# Patient Record
Sex: Female | Born: 1940 | Race: Black or African American | Hispanic: No | Marital: Married | Smoking: Former smoker
Health system: Southern US, Community
[De-identification: ages and names within clinical notes are randomized; demographics above are authoritative.]

## PROBLEM LIST (undated history)

## (undated) DIAGNOSIS — Z7401 Bed confinement status: Secondary | ICD-10-CM

## (undated) DIAGNOSIS — N189 Chronic kidney disease, unspecified: Secondary | ICD-10-CM

## (undated) DIAGNOSIS — M1990 Unspecified osteoarthritis, unspecified site: Secondary | ICD-10-CM

## (undated) DIAGNOSIS — L609 Nail disorder, unspecified: Secondary | ICD-10-CM

## (undated) DIAGNOSIS — K219 Gastro-esophageal reflux disease without esophagitis: Secondary | ICD-10-CM

## (undated) DIAGNOSIS — E039 Hypothyroidism, unspecified: Secondary | ICD-10-CM

## (undated) DIAGNOSIS — N390 Urinary tract infection, site not specified: Secondary | ICD-10-CM

## (undated) DIAGNOSIS — I10 Essential (primary) hypertension: Secondary | ICD-10-CM

## (undated) DIAGNOSIS — Z8619 Personal history of other infectious and parasitic diseases: Secondary | ICD-10-CM

## (undated) DIAGNOSIS — N361 Urethral diverticulum: Secondary | ICD-10-CM

## (undated) DIAGNOSIS — H547 Unspecified visual loss: Secondary | ICD-10-CM

## (undated) DIAGNOSIS — F0390 Unspecified dementia without behavioral disturbance: Secondary | ICD-10-CM

## (undated) DIAGNOSIS — E1140 Type 2 diabetes mellitus with diabetic neuropathy, unspecified: Secondary | ICD-10-CM

## (undated) DIAGNOSIS — R011 Cardiac murmur, unspecified: Secondary | ICD-10-CM

## (undated) HISTORY — PX: BACK SURGERY: SHX140

## (undated) HISTORY — PX: ABDOMINAL HYSTERECTOMY: SHX81

## (undated) HISTORY — PX: OTHER SURGICAL HISTORY: SHX169

## (undated) HISTORY — PX: APPENDECTOMY: SHX54

---

## 1998-01-20 ENCOUNTER — Encounter (HOSPITAL_COMMUNITY): Admission: RE | Admit: 1998-01-20 | Discharge: 1998-04-20 | Payer: Self-pay | Admitting: Nephrology

## 1998-04-20 ENCOUNTER — Encounter (HOSPITAL_COMMUNITY): Admission: RE | Admit: 1998-04-20 | Discharge: 1998-07-19 | Payer: Self-pay | Admitting: Nephrology

## 1998-07-24 ENCOUNTER — Encounter (HOSPITAL_COMMUNITY): Admission: RE | Admit: 1998-07-24 | Discharge: 1998-10-22 | Payer: Self-pay | Admitting: Nephrology

## 1998-09-18 ENCOUNTER — Ambulatory Visit (HOSPITAL_COMMUNITY): Admission: RE | Admit: 1998-09-18 | Discharge: 1998-09-18 | Payer: Self-pay | Admitting: Obstetrics and Gynecology

## 1998-10-18 ENCOUNTER — Other Ambulatory Visit: Admission: RE | Admit: 1998-10-18 | Discharge: 1998-10-18 | Payer: Self-pay | Admitting: Obstetrics and Gynecology

## 1998-10-23 ENCOUNTER — Encounter (HOSPITAL_COMMUNITY): Admission: RE | Admit: 1998-10-23 | Discharge: 1999-01-21 | Payer: Self-pay | Admitting: Nephrology

## 1999-01-29 ENCOUNTER — Encounter (HOSPITAL_COMMUNITY): Admission: RE | Admit: 1999-01-29 | Discharge: 1999-04-29 | Payer: Self-pay | Admitting: Nephrology

## 1999-03-05 ENCOUNTER — Ambulatory Visit (HOSPITAL_COMMUNITY): Admission: RE | Admit: 1999-03-05 | Discharge: 1999-03-05 | Payer: Self-pay | Admitting: Vascular Surgery

## 1999-03-05 ENCOUNTER — Encounter: Payer: Self-pay | Admitting: Vascular Surgery

## 1999-04-30 ENCOUNTER — Encounter (HOSPITAL_COMMUNITY): Admission: RE | Admit: 1999-04-30 | Discharge: 1999-05-21 | Payer: Self-pay | Admitting: Nephrology

## 1999-05-22 ENCOUNTER — Encounter (HOSPITAL_COMMUNITY): Admission: RE | Admit: 1999-05-22 | Discharge: 1999-08-20 | Payer: Self-pay | Admitting: Nephrology

## 1999-08-21 ENCOUNTER — Encounter (HOSPITAL_COMMUNITY): Admission: RE | Admit: 1999-08-21 | Discharge: 1999-11-19 | Payer: Self-pay | Admitting: Nephrology

## 1999-11-05 ENCOUNTER — Encounter: Payer: Self-pay | Admitting: Obstetrics and Gynecology

## 1999-11-05 ENCOUNTER — Ambulatory Visit (HOSPITAL_COMMUNITY): Admission: RE | Admit: 1999-11-05 | Discharge: 1999-11-05 | Payer: Self-pay | Admitting: Obstetrics and Gynecology

## 1999-11-22 ENCOUNTER — Encounter (HOSPITAL_COMMUNITY): Admission: RE | Admit: 1999-11-22 | Discharge: 2000-02-20 | Payer: Self-pay | Admitting: Nephrology

## 1999-11-30 ENCOUNTER — Other Ambulatory Visit: Admission: RE | Admit: 1999-11-30 | Discharge: 1999-11-30 | Payer: Self-pay | Admitting: Obstetrics and Gynecology

## 2000-02-04 ENCOUNTER — Encounter: Admission: RE | Admit: 2000-02-04 | Discharge: 2000-02-04 | Payer: Self-pay | Admitting: Nephrology

## 2000-02-04 ENCOUNTER — Encounter: Payer: Self-pay | Admitting: Nephrology

## 2000-02-18 ENCOUNTER — Encounter (HOSPITAL_COMMUNITY): Admission: RE | Admit: 2000-02-18 | Discharge: 2000-05-18 | Payer: Self-pay | Admitting: Nephrology

## 2000-04-28 ENCOUNTER — Encounter: Payer: Self-pay | Admitting: Nephrology

## 2000-05-15 ENCOUNTER — Encounter (HOSPITAL_COMMUNITY): Admission: RE | Admit: 2000-05-15 | Discharge: 2000-08-13 | Payer: Self-pay | Admitting: Nephrology

## 2000-08-04 ENCOUNTER — Inpatient Hospital Stay (HOSPITAL_COMMUNITY): Admission: RE | Admit: 2000-08-04 | Discharge: 2000-08-10 | Payer: Self-pay

## 2000-08-04 ENCOUNTER — Encounter (INDEPENDENT_AMBULATORY_CARE_PROVIDER_SITE_OTHER): Payer: Self-pay | Admitting: *Deleted

## 2000-08-08 ENCOUNTER — Encounter: Payer: Self-pay | Admitting: Nephrology

## 2000-08-12 ENCOUNTER — Inpatient Hospital Stay (HOSPITAL_COMMUNITY): Admission: EM | Admit: 2000-08-12 | Discharge: 2000-08-17 | Payer: Self-pay | Admitting: Emergency Medicine

## 2000-08-12 ENCOUNTER — Encounter (HOSPITAL_COMMUNITY): Admission: RE | Admit: 2000-08-12 | Discharge: 2000-11-10 | Payer: Self-pay | Admitting: Nephrology

## 2000-09-22 ENCOUNTER — Ambulatory Visit (HOSPITAL_COMMUNITY): Admission: RE | Admit: 2000-09-22 | Discharge: 2000-09-22 | Payer: Self-pay | Admitting: Vascular Surgery

## 2000-10-20 ENCOUNTER — Ambulatory Visit (HOSPITAL_COMMUNITY): Admission: RE | Admit: 2000-10-20 | Discharge: 2000-10-20 | Payer: Self-pay | Admitting: Vascular Surgery

## 2000-11-11 ENCOUNTER — Ambulatory Visit (HOSPITAL_COMMUNITY): Admission: RE | Admit: 2000-11-11 | Discharge: 2000-11-11 | Payer: Self-pay | Admitting: Nephrology

## 2000-11-17 ENCOUNTER — Encounter (HOSPITAL_COMMUNITY): Admission: RE | Admit: 2000-11-17 | Discharge: 2001-02-15 | Payer: Self-pay | Admitting: Nephrology

## 2000-12-01 ENCOUNTER — Encounter: Admission: RE | Admit: 2000-12-01 | Discharge: 2000-12-01 | Payer: Self-pay | Admitting: Nephrology

## 2000-12-01 ENCOUNTER — Encounter: Payer: Self-pay | Admitting: Nephrology

## 2001-01-05 ENCOUNTER — Ambulatory Visit (HOSPITAL_COMMUNITY): Admission: RE | Admit: 2001-01-05 | Discharge: 2001-01-05 | Payer: Self-pay | Admitting: Obstetrics and Gynecology

## 2001-01-05 ENCOUNTER — Encounter: Payer: Self-pay | Admitting: Obstetrics and Gynecology

## 2001-03-12 ENCOUNTER — Encounter: Admission: RE | Admit: 2001-03-12 | Discharge: 2001-03-12 | Payer: Self-pay | Admitting: *Deleted

## 2001-08-21 ENCOUNTER — Encounter: Payer: Self-pay | Admitting: Nephrology

## 2001-08-21 ENCOUNTER — Ambulatory Visit (HOSPITAL_COMMUNITY): Admission: RE | Admit: 2001-08-21 | Discharge: 2001-08-21 | Payer: Self-pay | Admitting: Nephrology

## 2002-02-17 ENCOUNTER — Encounter: Payer: Self-pay | Admitting: Ophthalmology

## 2002-02-17 ENCOUNTER — Ambulatory Visit (HOSPITAL_COMMUNITY): Admission: RE | Admit: 2002-02-17 | Discharge: 2002-02-17 | Payer: Self-pay | Admitting: Ophthalmology

## 2002-02-24 ENCOUNTER — Ambulatory Visit (HOSPITAL_COMMUNITY): Admission: RE | Admit: 2002-02-24 | Discharge: 2002-02-24 | Payer: Self-pay | Admitting: Internal Medicine

## 2002-07-05 ENCOUNTER — Encounter: Payer: Self-pay | Admitting: Nephrology

## 2002-07-05 ENCOUNTER — Encounter: Admission: RE | Admit: 2002-07-05 | Discharge: 2002-07-05 | Payer: Self-pay | Admitting: Nephrology

## 2002-10-01 ENCOUNTER — Encounter: Payer: Self-pay | Admitting: Nephrology

## 2002-10-01 ENCOUNTER — Encounter: Admission: RE | Admit: 2002-10-01 | Discharge: 2002-10-01 | Payer: Self-pay | Admitting: Nephrology

## 2002-11-22 ENCOUNTER — Other Ambulatory Visit: Admission: RE | Admit: 2002-11-22 | Discharge: 2002-11-22 | Payer: Self-pay | Admitting: Obstetrics and Gynecology

## 2002-11-29 ENCOUNTER — Encounter: Payer: Self-pay | Admitting: Obstetrics and Gynecology

## 2002-11-29 ENCOUNTER — Encounter: Admission: RE | Admit: 2002-11-29 | Discharge: 2002-11-29 | Payer: Self-pay | Admitting: Obstetrics and Gynecology

## 2003-04-20 ENCOUNTER — Ambulatory Visit (HOSPITAL_COMMUNITY): Admission: RE | Admit: 2003-04-20 | Discharge: 2003-04-20 | Payer: Self-pay | Admitting: Nephrology

## 2003-04-20 ENCOUNTER — Encounter: Payer: Self-pay | Admitting: Nephrology

## 2003-12-07 ENCOUNTER — Encounter: Admission: RE | Admit: 2003-12-07 | Discharge: 2003-12-07 | Payer: Self-pay | Admitting: Obstetrics and Gynecology

## 2004-06-25 ENCOUNTER — Emergency Department (HOSPITAL_COMMUNITY): Admission: EM | Admit: 2004-06-25 | Discharge: 2004-06-26 | Payer: Self-pay | Admitting: Emergency Medicine

## 2004-07-09 ENCOUNTER — Other Ambulatory Visit: Admission: RE | Admit: 2004-07-09 | Discharge: 2004-07-09 | Payer: Self-pay | Admitting: Obstetrics and Gynecology

## 2004-08-15 ENCOUNTER — Encounter: Admission: RE | Admit: 2004-08-15 | Discharge: 2004-08-15 | Payer: Self-pay | Admitting: Nephrology

## 2004-08-17 ENCOUNTER — Emergency Department (HOSPITAL_COMMUNITY): Admission: EM | Admit: 2004-08-17 | Discharge: 2004-08-18 | Payer: Self-pay | Admitting: Emergency Medicine

## 2004-08-27 ENCOUNTER — Inpatient Hospital Stay (HOSPITAL_COMMUNITY): Admission: EM | Admit: 2004-08-27 | Discharge: 2004-09-04 | Payer: Self-pay

## 2004-08-31 ENCOUNTER — Encounter (INDEPENDENT_AMBULATORY_CARE_PROVIDER_SITE_OTHER): Payer: Self-pay | Admitting: Specialist

## 2004-09-03 ENCOUNTER — Ambulatory Visit: Payer: Self-pay | Admitting: Physical Medicine & Rehabilitation

## 2004-09-04 ENCOUNTER — Inpatient Hospital Stay (HOSPITAL_COMMUNITY)
Admission: RE | Admit: 2004-09-04 | Discharge: 2004-09-12 | Payer: Self-pay | Admitting: Physical Medicine & Rehabilitation

## 2004-09-24 ENCOUNTER — Encounter
Admission: RE | Admit: 2004-09-24 | Discharge: 2004-12-23 | Payer: Self-pay | Admitting: Physical Medicine & Rehabilitation

## 2004-10-15 ENCOUNTER — Emergency Department (HOSPITAL_COMMUNITY): Admission: EM | Admit: 2004-10-15 | Discharge: 2004-10-16 | Payer: Self-pay | Admitting: Emergency Medicine

## 2004-10-19 ENCOUNTER — Encounter
Admission: RE | Admit: 2004-10-19 | Discharge: 2005-01-17 | Payer: Self-pay | Admitting: Physical Medicine & Rehabilitation

## 2004-10-24 ENCOUNTER — Ambulatory Visit: Payer: Self-pay | Admitting: Physical Medicine & Rehabilitation

## 2004-11-05 ENCOUNTER — Ambulatory Visit: Payer: Self-pay | Admitting: Internal Medicine

## 2004-11-26 ENCOUNTER — Ambulatory Visit: Payer: Self-pay | Admitting: Physical Medicine & Rehabilitation

## 2004-12-03 ENCOUNTER — Ambulatory Visit (HOSPITAL_COMMUNITY): Admission: RE | Admit: 2004-12-03 | Discharge: 2004-12-03 | Payer: Self-pay | Admitting: Obstetrics and Gynecology

## 2005-02-25 ENCOUNTER — Encounter
Admission: RE | Admit: 2005-02-25 | Discharge: 2005-05-26 | Payer: Self-pay | Admitting: Physical Medicine & Rehabilitation

## 2005-02-27 ENCOUNTER — Ambulatory Visit: Payer: Self-pay | Admitting: Physical Medicine & Rehabilitation

## 2005-03-27 ENCOUNTER — Ambulatory Visit (HOSPITAL_COMMUNITY): Admission: RE | Admit: 2005-03-27 | Discharge: 2005-03-27 | Payer: Self-pay | Admitting: Obstetrics and Gynecology

## 2005-04-26 ENCOUNTER — Ambulatory Visit: Payer: Self-pay | Admitting: Physical Medicine & Rehabilitation

## 2005-07-26 ENCOUNTER — Encounter
Admission: RE | Admit: 2005-07-26 | Discharge: 2005-10-24 | Payer: Self-pay | Admitting: Physical Medicine & Rehabilitation

## 2005-07-26 ENCOUNTER — Ambulatory Visit: Payer: Self-pay | Admitting: Physical Medicine & Rehabilitation

## 2005-10-22 ENCOUNTER — Ambulatory Visit: Payer: Self-pay | Admitting: Physical Medicine & Rehabilitation

## 2005-10-30 ENCOUNTER — Encounter
Admission: RE | Admit: 2005-10-30 | Discharge: 2005-11-20 | Payer: Self-pay | Admitting: Physical Medicine & Rehabilitation

## 2005-12-06 ENCOUNTER — Ambulatory Visit (HOSPITAL_COMMUNITY): Admission: RE | Admit: 2005-12-06 | Discharge: 2005-12-06 | Payer: Self-pay | Admitting: Obstetrics and Gynecology

## 2005-12-17 ENCOUNTER — Ambulatory Visit: Payer: Self-pay | Admitting: Physical Medicine & Rehabilitation

## 2005-12-17 ENCOUNTER — Encounter
Admission: RE | Admit: 2005-12-17 | Discharge: 2006-03-17 | Payer: Self-pay | Admitting: Physical Medicine & Rehabilitation

## 2006-06-27 ENCOUNTER — Encounter: Admission: RE | Admit: 2006-06-27 | Discharge: 2006-06-27 | Payer: Self-pay | Admitting: Nephrology

## 2006-07-09 ENCOUNTER — Encounter (HOSPITAL_COMMUNITY): Admission: RE | Admit: 2006-07-09 | Discharge: 2006-10-07 | Payer: Self-pay | Admitting: Nephrology

## 2006-10-21 HISTORY — PX: KIDNEY TRANSPLANT: SHX239

## 2007-03-31 ENCOUNTER — Ambulatory Visit: Payer: Self-pay | Admitting: Internal Medicine

## 2007-03-31 ENCOUNTER — Inpatient Hospital Stay (HOSPITAL_COMMUNITY): Admission: EM | Admit: 2007-03-31 | Discharge: 2007-04-13 | Payer: Self-pay | Admitting: Emergency Medicine

## 2007-03-31 ENCOUNTER — Ambulatory Visit: Payer: Self-pay | Admitting: Cardiovascular Disease

## 2007-04-01 ENCOUNTER — Encounter (INDEPENDENT_AMBULATORY_CARE_PROVIDER_SITE_OTHER): Payer: Self-pay | Admitting: Internal Medicine

## 2007-04-10 ENCOUNTER — Ambulatory Visit: Payer: Self-pay | Admitting: Physical Medicine & Rehabilitation

## 2007-07-07 ENCOUNTER — Encounter (HOSPITAL_COMMUNITY): Admission: RE | Admit: 2007-07-07 | Discharge: 2007-10-05 | Payer: Self-pay | Admitting: Nephrology

## 2007-07-31 ENCOUNTER — Ambulatory Visit: Payer: Self-pay | Admitting: Vascular Surgery

## 2007-10-02 ENCOUNTER — Inpatient Hospital Stay (HOSPITAL_COMMUNITY): Admission: EM | Admit: 2007-10-02 | Discharge: 2007-10-06 | Payer: Self-pay | Admitting: Emergency Medicine

## 2007-10-27 ENCOUNTER — Encounter (HOSPITAL_COMMUNITY): Admission: RE | Admit: 2007-10-27 | Discharge: 2008-01-25 | Payer: Self-pay | Admitting: Nephrology

## 2008-01-26 ENCOUNTER — Encounter (HOSPITAL_COMMUNITY): Admission: RE | Admit: 2008-01-26 | Discharge: 2008-04-25 | Payer: Self-pay | Admitting: Nephrology

## 2008-02-04 ENCOUNTER — Encounter: Payer: Self-pay | Admitting: Vascular Surgery

## 2008-02-04 ENCOUNTER — Ambulatory Visit: Payer: Self-pay | Admitting: Vascular Surgery

## 2008-02-04 ENCOUNTER — Ambulatory Visit (HOSPITAL_COMMUNITY): Admission: RE | Admit: 2008-02-04 | Discharge: 2008-02-04 | Payer: Self-pay | Admitting: Vascular Surgery

## 2008-02-26 ENCOUNTER — Ambulatory Visit: Payer: Self-pay | Admitting: Vascular Surgery

## 2008-05-17 ENCOUNTER — Encounter (HOSPITAL_COMMUNITY): Admission: RE | Admit: 2008-05-17 | Discharge: 2008-08-15 | Payer: Self-pay | Admitting: Nephrology

## 2008-08-17 ENCOUNTER — Encounter (HOSPITAL_COMMUNITY): Admission: RE | Admit: 2008-08-17 | Discharge: 2008-10-20 | Payer: Self-pay | Admitting: Nephrology

## 2008-10-25 ENCOUNTER — Encounter (HOSPITAL_COMMUNITY): Admission: RE | Admit: 2008-10-25 | Discharge: 2009-01-23 | Payer: Self-pay | Admitting: Nephrology

## 2009-01-18 ENCOUNTER — Ambulatory Visit: Payer: Self-pay | Admitting: Infectious Diseases

## 2009-01-18 ENCOUNTER — Ambulatory Visit: Payer: Self-pay | Admitting: Critical Care Medicine

## 2009-01-18 ENCOUNTER — Inpatient Hospital Stay (HOSPITAL_COMMUNITY): Admission: EM | Admit: 2009-01-18 | Discharge: 2009-01-27 | Payer: Self-pay | Admitting: Emergency Medicine

## 2009-01-26 ENCOUNTER — Encounter: Payer: Self-pay | Admitting: Endocrinology

## 2009-02-17 ENCOUNTER — Encounter (HOSPITAL_COMMUNITY): Admission: RE | Admit: 2009-02-17 | Discharge: 2009-05-18 | Payer: Self-pay | Admitting: Nephrology

## 2009-02-19 ENCOUNTER — Inpatient Hospital Stay (HOSPITAL_COMMUNITY): Admission: EM | Admit: 2009-02-19 | Discharge: 2009-02-21 | Payer: Self-pay | Admitting: Emergency Medicine

## 2009-05-10 ENCOUNTER — Inpatient Hospital Stay (HOSPITAL_COMMUNITY): Admission: EM | Admit: 2009-05-10 | Discharge: 2009-05-17 | Payer: Self-pay | Admitting: Emergency Medicine

## 2009-05-24 ENCOUNTER — Encounter (HOSPITAL_COMMUNITY): Admission: RE | Admit: 2009-05-24 | Discharge: 2009-08-22 | Payer: Self-pay | Admitting: Nephrology

## 2009-07-24 ENCOUNTER — Inpatient Hospital Stay (HOSPITAL_COMMUNITY): Admission: EM | Admit: 2009-07-24 | Discharge: 2009-07-28 | Payer: Self-pay | Admitting: Emergency Medicine

## 2009-07-26 ENCOUNTER — Ambulatory Visit: Payer: Self-pay | Admitting: Infectious Disease

## 2009-09-12 ENCOUNTER — Encounter (HOSPITAL_COMMUNITY): Admission: RE | Admit: 2009-09-12 | Discharge: 2009-12-11 | Payer: Self-pay | Admitting: Nephrology

## 2009-11-01 ENCOUNTER — Encounter: Admission: RE | Admit: 2009-11-01 | Discharge: 2009-11-01 | Payer: Self-pay | Admitting: Nephrology

## 2009-12-26 ENCOUNTER — Encounter (HOSPITAL_COMMUNITY): Admission: RE | Admit: 2009-12-26 | Discharge: 2010-03-26 | Payer: Self-pay | Admitting: Nephrology

## 2010-03-27 ENCOUNTER — Encounter (HOSPITAL_COMMUNITY): Admission: RE | Admit: 2010-03-27 | Discharge: 2010-06-25 | Payer: Self-pay | Admitting: Nephrology

## 2010-06-27 ENCOUNTER — Encounter (HOSPITAL_COMMUNITY): Admission: RE | Admit: 2010-06-27 | Discharge: 2010-09-25 | Payer: Self-pay | Admitting: Nephrology

## 2010-07-18 ENCOUNTER — Emergency Department (HOSPITAL_COMMUNITY): Admission: EM | Admit: 2010-07-18 | Discharge: 2010-07-19 | Payer: Self-pay | Admitting: Emergency Medicine

## 2010-07-20 ENCOUNTER — Encounter (INDEPENDENT_AMBULATORY_CARE_PROVIDER_SITE_OTHER): Payer: Self-pay | Admitting: *Deleted

## 2010-09-24 ENCOUNTER — Inpatient Hospital Stay (HOSPITAL_COMMUNITY)
Admission: EM | Admit: 2010-09-24 | Discharge: 2010-09-28 | Payer: Self-pay | Source: Home / Self Care | Attending: Endocrinology | Admitting: Endocrinology

## 2010-10-19 ENCOUNTER — Encounter (HOSPITAL_COMMUNITY)
Admission: RE | Admit: 2010-10-19 | Discharge: 2010-11-20 | Payer: Self-pay | Source: Home / Self Care | Attending: Nephrology | Admitting: Nephrology

## 2010-11-10 ENCOUNTER — Encounter: Payer: Self-pay | Admitting: Physical Medicine and Rehabilitation

## 2010-11-11 ENCOUNTER — Encounter: Payer: Self-pay | Admitting: Nephrology

## 2010-11-11 ENCOUNTER — Encounter: Payer: Self-pay | Admitting: Obstetrics and Gynecology

## 2010-11-13 ENCOUNTER — Inpatient Hospital Stay (HOSPITAL_COMMUNITY)
Admission: EM | Admit: 2010-11-13 | Discharge: 2010-11-20 | Payer: Self-pay | Source: Home / Self Care | Attending: Endocrinology | Admitting: Endocrinology

## 2010-11-14 LAB — DIFFERENTIAL
Basophils Relative: 0 % (ref 0–1)
Lymphs Abs: 0.3 10*3/uL — ABNORMAL LOW (ref 0.7–4.0)
Monocytes Relative: 4 % (ref 3–12)
Neutro Abs: 5.2 10*3/uL (ref 1.7–7.7)
Neutrophils Relative %: 91 % — ABNORMAL HIGH (ref 43–77)

## 2010-11-14 LAB — URINALYSIS, ROUTINE W REFLEX MICROSCOPIC
Bilirubin Urine: NEGATIVE
Ketones, ur: NEGATIVE mg/dL
Nitrite: NEGATIVE
Protein, ur: NEGATIVE mg/dL
Specific Gravity, Urine: 1.015 (ref 1.005–1.030)
Urobilinogen, UA: 0.2 mg/dL (ref 0.0–1.0)

## 2010-11-14 LAB — CBC
HCT: 40.3 % (ref 36.0–46.0)
Hemoglobin: 12.4 g/dL (ref 12.0–15.0)
Hemoglobin: 12.1 g/dL (ref 12.0–15.0)
MCH: 26.4 pg (ref 26.0–34.0)
MCH: 26.2 pg (ref 26.0–34.0)
MCV: 88.3 fL (ref 78.0–100.0)
MCV: 87.2 fL (ref 78.0–100.0)
RBC: 4.7 MIL/uL (ref 3.87–5.11)
RBC: 4.62 MIL/uL (ref 3.87–5.11)
WBC: 5.8 10*3/uL (ref 4.0–10.5)
WBC: 4.8 10*3/uL (ref 4.0–10.5)

## 2010-11-14 LAB — COMPREHENSIVE METABOLIC PANEL
ALT: 11 U/L (ref 0–35)
ALT: 9 U/L (ref 0–35)
AST: 16 U/L (ref 0–37)
AST: 12 U/L (ref 0–37)
Alkaline Phosphatase: 55 U/L (ref 39–117)
Alkaline Phosphatase: 54 U/L (ref 39–117)
CO2: 29 mEq/L (ref 19–32)
CO2: 30 mEq/L (ref 19–32)
Chloride: 100 mEq/L (ref 96–112)
Creatinine, Ser: 2.19 mg/dL — ABNORMAL HIGH (ref 0.4–1.2)
GFR calc Af Amer: 27 mL/min — ABNORMAL LOW (ref >60)
GFR calc Af Amer: 28 mL/min — ABNORMAL LOW (ref >60)
GFR calc non Af Amer: 22 mL/min — ABNORMAL LOW (ref >60)
GFR calc non Af Amer: 23 mL/min — ABNORMAL LOW (ref >60)
Glucose, Bld: 220 mg/dL — ABNORMAL HIGH (ref 70–99)
Potassium: 5.1 mEq/L (ref 3.5–5.1)
Potassium: 3.9 mEq/L (ref 3.5–5.1)
Sodium: 143 mEq/L (ref 135–145)
Total Bilirubin: 0.3 mg/dL (ref 0.3–1.2)

## 2010-11-14 LAB — GLUCOSE, CAPILLARY: Glucose-Capillary: 245 mg/dL — ABNORMAL HIGH (ref 70–99)

## 2010-11-15 LAB — COMPREHENSIVE METABOLIC PANEL
ALT: 8 U/L (ref 0–35)
Alkaline Phosphatase: 52 U/L (ref 39–117)
CO2: 28 mEq/L (ref 19–32)
Chloride: 108 mEq/L (ref 96–112)
GFR calc non Af Amer: 29 mL/min — ABNORMAL LOW (ref >60)
Glucose, Bld: 70 mg/dL (ref 70–99)
Potassium: 3.7 mEq/L (ref 3.5–5.1)
Sodium: 143 mEq/L (ref 135–145)

## 2010-11-15 LAB — GLUCOSE, CAPILLARY: Glucose-Capillary: 140 mg/dL — ABNORMAL HIGH (ref 70–99)

## 2010-11-15 LAB — URINE CULTURE
Colony Count: >=100000
Culture  Setup Time: 201201242033

## 2010-11-15 NOTE — H&P (Addendum)
NAMESERAPHINE, GUDIEL                  ACCOUNT NO.:  [id number]  MEDICAL RECORD NO.:  [id number]          PATIENT TYPE:  INP  LOCATION:  5524                         FACILITY:  [facility]  PHYSICIAN:  Gaspar Garbe, M.D.DATE OF BIRTH:  10/28/1940  DATE OF ADMISSION:  11/13/2010 DATE OF DISCHARGE:                             HISTORY & PHYSICAL   CHIEF COMPLAINT:  Altered mental status, UTI.  HISTORY OF PRESENT ILLNESS:  The patient is a 70 year old African American female who is status post renal transplant in 2007, which had been performed at [facility].  The last hospitalization was in December 2011 because of urinary tract infection and altered mental status.  She normally takes Cipro for prophylaxis but per report, probably has not been taking it for the past week until taking 1 dose yesterday.  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  Over the past couple of days, she has had some decrease in her mental status and notes some burning during urination, changes in her mental status.  She was brought to the emergency room and found to have a considerable urinary tract infection. I was asked to admit the patient.  The patient was started on IV vancomycin, however, the ER physician neglected to write for blood cultures prior to doing this.  Fortunately, a urine culture was ordered and is currently pending.  ALLERGIES:  PENICILLIN, but the patient has taken cefepime successfully in the past.  ASPIRIN, TYLENOL, IV CONTRAST due to her kidney function, CYCLOSPORINE, OXYCODONE, HYDROCORTISONE, and LATEX.  MEDICATIONS: 1. Amlodipine 10 mg daily. 2. Allopurinol 150 mg daily. 3. Calcitriol 0.05 tablets 1 tablet daily. 4. Fentanyl 50 mcg plus 12 mcg patches every 3 days. 5. Lasix 40 mg  b.i.d. 6. Neurontin 100 mg in the morning, 200 at night. 7. Levemir 28 units subcu daily.  The patient has taken her dose     today. 8. Sliding scale by Humalog. 9. Labetalol 200 mg t.i.d. 10.Synthroid 175 mcg daily. 11.Magnesium oxide 400 mg daily. 12.Lipitor 40 mg daily. 13.Potassium chloride 40 mEq daily. 14.Prednisone 10 mg daily. 15.Question Cipro for prophylaxis. 16.Rapamune 3 mg daily. 17.Prilosec 20 mg daily. 18.Vitamin D 50,000 units weekly. 19.Calcium and vitamin D t.i.d. with meals. 20.CellCept 500 mg twice daily. 21.Acyclovir ophthalmic ointment twice daily. 22.Question of Valtrex, her husband is not certain and it is missing     from the automated version through the emergency room, although she     did state this as once daily.  PAST MEDICAL HISTORY: 1. Status post renal transplant in 2007, followed by [facility]. 2. Herpes zoster. 3. Urosepsis with encephalopathy in December 2011. 4. Hypothyroidism. 5. Anemia of chronic disease. 6. Diabetes mellitus type 2. 7. History of gout. 8. Hypothyroidism. 9. Hyperlipidemia. 10.Vitamin D deficiency. 11.Depression.  PAST SURGICAL HISTORY:  Renal transplant as above, parathyroidectomy, TAH, C-spine, tonsils, carpal tunnel, bilateral cataracts.  SOCIAL HISTORY:  The  patient lives in [place] with her husband.  He is at bedside.  She is married and she is nonsmoker, nondrinker.  FAMILY HISTORY:  Mother died from diabetic complications at age 45. Father died of cancer at 53.  She has a brother who died of coronary artery disease at age 49.  REVIEW OF SYSTEMS:  The patient is more alert than when she first arrived.  No fevers or chills but frequency and burning with urination. She has had altered mental status earlier.  She denies any chest pain or shortness of breath, any new fatigue, any new abdominal pain, and review of systems is negative on 12-point scale.  ADVANCED DIRECTIVES:  The patient is full  code.  PHYSICAL EXAMINATION:  VITAL SIGNS:  Temperature 99, pulse 82, respiratory rate 18, blood pressure 134/78, satting 98% on room air. GENERAL:  Ill appearing. HEENT:  Her left eye is swollen and shut, consistent with her herpes infection.  Oropharynx is clear. NECK:  Supple.  No lymphadenopathy, JVD, or bruit. HEART:  Regular rate and rhythm.  No murmur, rub, or gallop. LUNGS:  Clear to auscultation bilaterally. ABDOMEN:  Soft and nontender.  Normoactive bowel sounds.  No tenderness over transplant site. EXTREMITIES:  No clubbing, cyanosis, or edema. MUSCULOSKELETAL:  No joint deformities noted. NEUROLOGIC:  The patient is oriented to person, place, and time currently, but was not at the time of admission to the emergency room. Strength 4+ to 5 bilaterally and equal with normal sensation throughout.  Chest x-ray, no acuity noted.  Head CT is nonacute as well.  LABS:  White count 5.8, hemoglobin 12.4, hematocrit 41.5, platelets 252, BUN and creatinine 30 and 2.1 respectively, which are elevated from her prior hospitalization discharge with her baseline at that time showing a creatinine of 1.6.  Her LFTs are within normal limits.  Her albumin is slightly decreased at 3.5.  Urinalysis shows white blood cells too numerous to count, large LE, with nitrite negative.  She does have glucose in her urine as well and her glucose was 362.  ASSESSMENT AND PLAN: 1. Urinary tract infection, questionable if urosepsis.  I will not     treat her with Cipro as this is her prophylactic drug and she has     been given vancomycin IV per the emergency room, presumably to     cover sepsis, they will not cover urinary tract infections.  I will     put her on cefepime for this as her records indicate that she has     tolerated that well.  We will dose this renally at pharmacy dose.     Unfortunately, she was given vancomycin prior to proceeding with     blood cultures which may makes impossible for  [place] to find a     causative agent if her urine does not grow.  Therefore, we would be     looking at presumptive diagnosis and the empiric course of     antibiotics in the future which is unfortunately not very helpful.     Per her records, Dr. Ninetta Lights of Infectious Disease saw her at last     hospitalization.  I will leave up to Dr. Evlyn Kanner as to whether to get     consultation as her labs come back tomorrow. 2. Altered mental status.  It seems to be improving with IV fluids.     She has received a liter of bolus and will continue her on IV     fluids  here.  I will hold her Lasix. 3. History of renal transplant.  We will continue follow her     immunosuppression.  I have asked the pharmacy to help with her     dosing. 4. Dehydration as noted above. 5. Diabetes mellitus.  We will continue her normal home insulin.  Her     sugars are most likely higher because of her illness.  She has had     issues with hypoglycemia in the past, however, she was not given a     sliding scale until I asked the nursing staff to give her 6 units     of insulin . with a slight glucosuria and altered mental status,     her blood sugar will  be greater than 300. 6. Hypothyroidism.  We will check her TSH and continue Synthroid. 7. We will continue her medications for her herpes ophthalmic ointment     perhaps this needs to be correlated with her home pharmacy.  I will     leave it up to the pharmacy to look into this as well. 8. Continue Duragesic for pain relief. 9. Continue allopurinol or gout prophylaxis, this may need to be     adjusted if her creatinine kinase rise.     Gaspar Garbe, M.D.     RWT/MEDQ  D:  11/13/2010  T:  11/14/2010  Job:  [id number]  Electronically Signed by Guerry Bruin M.D. on 11/15/2010 04:18:48 PM

## 2010-11-16 LAB — GLUCOSE, CAPILLARY: Glucose-Capillary: 86 mg/dL (ref 70–99)

## 2010-11-17 LAB — GLUCOSE, CAPILLARY
Glucose-Capillary: 115 mg/dL — ABNORMAL HIGH (ref 70–99)
Glucose-Capillary: 85 mg/dL (ref 70–99)

## 2010-11-17 LAB — COMPREHENSIVE METABOLIC PANEL
ALT: <8 U/L (ref 0–35)
Alkaline Phosphatase: 45 U/L (ref 39–117)
CO2: 24 mEq/L (ref 19–32)
Calcium: 8.6 mg/dL (ref 8.4–10.5)
Chloride: 112 mEq/L (ref 96–112)
GFR calc non Af Amer: 38 mL/min — ABNORMAL LOW (ref >60)
Glucose, Bld: 101 mg/dL — ABNORMAL HIGH (ref 70–99)
Potassium: 3.9 mEq/L (ref 3.5–5.1)
Sodium: 143 mEq/L (ref 135–145)
Total Bilirubin: 0.6 mg/dL (ref 0.3–1.2)

## 2010-11-17 LAB — CBC
HCT: 40.1 % (ref 36.0–46.0)
Hemoglobin: 11.8 g/dL — ABNORMAL LOW (ref 12.0–15.0)
MCHC: 29.4 g/dL — ABNORMAL LOW (ref 30.0–36.0)
RBC: 4.53 MIL/uL (ref 3.87–5.11)

## 2010-11-18 LAB — DIFFERENTIAL
Eosinophils Relative: 1 % (ref 0–5)
Lymphocytes Relative: 11 % — ABNORMAL LOW (ref 12–46)
Lymphs Abs: 0.3 10*3/uL — ABNORMAL LOW (ref 0.7–4.0)
Monocytes Absolute: 0.9 10*3/uL (ref 0.1–1.0)
Neutro Abs: 1.8 10*3/uL (ref 1.7–7.7)

## 2010-11-18 LAB — GLUCOSE, CAPILLARY
Glucose-Capillary: 61 mg/dL — ABNORMAL LOW (ref 70–99)
Glucose-Capillary: 73 mg/dL (ref 70–99)
Glucose-Capillary: 168 mg/dL — ABNORMAL HIGH (ref 70–99)

## 2010-11-18 LAB — COMPREHENSIVE METABOLIC PANEL
ALT: 10 U/L (ref 0–35)
Alkaline Phosphatase: 44 U/L (ref 39–117)
BUN: 10 mg/dL (ref 6–23)
CO2: 29 mEq/L (ref 19–32)
Calcium: 9.1 mg/dL (ref 8.4–10.5)
GFR calc non Af Amer: 35 mL/min — ABNORMAL LOW (ref >60)
Glucose, Bld: 65 mg/dL — ABNORMAL LOW (ref 70–99)
Potassium: 4 mEq/L (ref 3.5–5.1)
Total Protein: 5.7 g/dL — ABNORMAL LOW (ref 6.0–8.3)

## 2010-11-18 LAB — CBC
HCT: 35.9 % — ABNORMAL LOW (ref 36.0–46.0)
Hemoglobin: 10.7 g/dL — ABNORMAL LOW (ref 12.0–15.0)
MCHC: 29.8 g/dL — ABNORMAL LOW (ref 30.0–36.0)
MCV: 87.8 fL (ref 78.0–100.0)
RDW: 17.6 % — ABNORMAL HIGH (ref 11.5–15.5)

## 2010-11-19 LAB — COMPREHENSIVE METABOLIC PANEL
AST: 13 U/L (ref 0–37)
Alkaline Phosphatase: 43 U/L (ref 39–117)
CO2: 28 mEq/L (ref 19–32)
Chloride: 104 mEq/L (ref 96–112)
Creatinine, Ser: 1.4 mg/dL — ABNORMAL HIGH (ref 0.4–1.2)
GFR calc Af Amer: 45 mL/min — ABNORMAL LOW (ref >60)
GFR calc non Af Amer: 37 mL/min — ABNORMAL LOW (ref >60)
Potassium: 4.2 mEq/L (ref 3.5–5.1)
Total Bilirubin: 0.5 mg/dL (ref 0.3–1.2)

## 2010-11-19 LAB — GLUCOSE, CAPILLARY
Glucose-Capillary: 104 mg/dL — ABNORMAL HIGH (ref 70–99)
Glucose-Capillary: 125 mg/dL — ABNORMAL HIGH (ref 70–99)

## 2010-11-20 LAB — CULTURE, BLOOD (ROUTINE X 2)
Culture  Setup Time: 201201250552
Culture: NO GROWTH

## 2010-11-20 LAB — GLUCOSE, CAPILLARY: Glucose-Capillary: 40 mg/dL — CL (ref 70–99)

## 2010-11-22 NOTE — Letter (Signed)
Summary: New Patient letter  [facility]  [address] [place], [place] [address]   Phone: [phone]  Fax: [phone]       07/20/2010 MRN: [id number]  [name] [address] [place], [place]  [address]  Dear [name],  Welcome to the Gastroenterology Division at [facility].    You are scheduled to see Dr.  Marina Goodell on 09-06-10 at 10:00a.m. on the 3rd floor at [facility], [address].  We ask that you try to arrive at our office 15 minutes prior to your appointment time to allow for check-in.  We would like you to complete the enclosed self-administered evaluation form prior to your visit and bring it with you on the day of your appointment.  We will review it with you.  Also, please bring a complete list of all your medications or, if you prefer, bring the medication bottles and we will list them.  Please bring your insurance card so that we may make a copy of it.  If your insurance requires a referral to see a specialist, please bring your referral form from your primary care physician.  Co-payments are due at the time of your visit and may be paid by cash, check or credit card.     Your office visit will consist of a consult with your physician (includes a physical exam), any laboratory testing he/she may order, scheduling of any necessary diagnostic testing (e.g. x-ray, ultrasound, CT-scan), and scheduling of a procedure (e.g. Endoscopy, Colonoscopy) if required.  Please allow enough time on your schedule to allow for any/all of these possibilities.    If you cannot keep your appointment, please call [phone] to cancel or reschedule prior to your appointment date.  This allows [place] the opportunity to schedule an appointment for another patient in need of care.  If you do not cancel or reschedule by 5 p.m. the business day prior to your appointment date, you will be charged a $50.00 late cancellation/no-show fee.    Thank you for choosing [facility] for your medical needs.  We appreciate the opportunity to care for you.  Please visit [place] at our website  to learn more about our practice.                     Sincerely,                                                             The Gastroenterology Division

## 2010-11-26 NOTE — Discharge Summary (Signed)
[name], [name]                  ACCOUNT NO.:  [id number]  MEDICAL RECORD NO.:  [id number]          PATIENT TYPE:  INP  LOCATION:  5524                         FACILITY:  [facility]  PHYSICIAN:  Tera Mater. Evlyn Kanner, M.D. DATE OF BIRTH:  10-19-1941  DATE OF ADMISSION:  11/13/2010 DATE OF DISCHARGE:  11/20/2010                              DISCHARGE SUMMARY   DISCHARGE DIAGNOSES: 1. Severe enterococcal urinary tract infection with early sepsis,     clinically much improved now finished with antibiotics. 2. Significant varicella zoster virus involving left eye and forehead,     now with postherpetic neuralgia causing trouble. 3. Type 2 diabetes with blood sugars under fair control with a low     blood sugar this morning. 4. Renal transplant with stable renal parameters. 5. Hypothyroidism, being treated 6. Hypertension with variable control. 7. History of gout on suppressive therapy. 8. Hyperlipidemia, being treated. 9. Anemia of chronic disease with stable levels. 10.Vitamin D deficiency. 11.Depression.  CONSULTATIONS:  None.  PROCEDURES:  A CT of the head.  [name] is a 70 year old black female who presented to my partner, Dr. Wylene Simmer, on November 13, 2010.  As with many other times she has presented, she came in with altered mental status with fever and significant UTI with what we felt to be impending sepsis.  Fortunately, the patient did well yet again.  There was some question as to whether she had been omitting her ciprofloxacin suppressive therapy.  The patient has been treated now with roughly 7 days of IV vancomycin.  She has been afebrile for several days.  During this hospitalization, she has had a flare of the recently diagnosed varicella zoster virus.  This involves her left eye and forehead and is causing significant pain and she had tremendous swelling of this eye.  There was also question as to whether she had some sort of allergic reaction to the eye ointment  that has been used.  She is now back on some suppressive viral therapy to try to help this thinking that with her immunosuppressed state that she might need more treatment.  Blood sugars have been good until this morning.  She had a low.  She did not eat quite as well yesterday. Bowels are working well.  Blood pressure has been variable, and I have increased her Lopressor and going to give that little bit of time to work.  Lipid therapy has been continued.  Her mobilization has been slow but she is doing a bit better at the present time.  This morning vital signs, blood pressure 164/66, pulse 75, respiratory rate 20, temperature 98, O2 sat 94%.  Her lab data yesterday, sodium 143, potassium 4.2, chloride 104, CO2 28, BUN 14, creatinine 1.4, estimated GFR of 45, a glucose of 90 was noted.  Albumin is 2.9 and total protein 5.9.  Calcium 9.0.  Liver function testing was normal.  CBC 2 days ago, white count 3100, hemoglobin 10.7, platelets 255,000.  At presentation, white count was 5800, hemoglobin 12.7, platelets 152,000.  Initial chemistries, sodium 141, potassium 5.1, chloride 100, CO2 29, BUN 30, creatinine 2.19,  glucose of 362.  Estimated GFR of 27.  Liver function testing was normal.  Albumin was 3.5.  An urinalysis was large leukocytes 250 mg and glucose, small blood.  Microscopic was too numerous to count white cells and 0-2 reds.  As noted, the urine culture did return with enterococcus greater than 100,000.  Two blood cultures on November 13, 2010, have remained negative.  A TSH was reasonable at 0.897.  RADIOLOGY TESTING:  With her altered mental status at presentation, a CT of the head was done and it showed atrophy and small vessel disease, no acute cranial findings.  A chest x-ray was also done with no active disease.  In summary, I have a 70 year old black female with history of renal transplant presenting with recurrent urinary tract infection and early sepsis.  Her altered  mental status has improved.  Multiple medical problems were addressed as above.  Her discharge medications will include: 1. A new dose of gabapentin which is 400 mg three times a day. 2. Labetalol now at 300 mg every 8 hours, it is a new dose. 3. She is on CellCept 500 mg twice daily. 4. Rapamune 3 mg daily. 5. Amlodipine 10 daily. 6. Allopurinol 150 daily. 7. Calcitriol 0.5 daily. 8. Her Cipro will return as suppressive therapy, 250, Monday,     Wednesday, and Friday. 9. Crestor is 40 mg daily 10.She is on Excedrin Migraine as needed. 11.She is on a fentanyl patch both a 50 and a 12.5 every 3 days. 12.Lasix returns to 40 mg daily. 13.She is on her sliding scale of Humalog that she has used before and     28 of Levemir. 14.Levothyroxine is 175. 15.Mag oxide is 400 once daily. 16.Prednisone is 10 mg daily. 17.Her sirolimus is 3 mg daily. 18.She is going to stay on valganciclovir 1 g for probably another     week to 10 days. 19.Vitamin D will be 50,000 units weekly. 20.Zantac is 150.  She will come see me in a week.  She is to call if she has recurrent fever.  She is to slowly mobilize.  She is to resume her prior diet.  No wound care is necessary.          ______________________________ Tera Mater Evlyn Kanner, M.D.     SAS/MEDQ  D:  11/20/2010  T:  11/20/2010  Job:  [id number]  Electronically Signed by Adrian Prince M.D. on 11/26/2010 07:00:34 PM

## 2011-01-01 LAB — BASIC METABOLIC PANEL
BUN: 16 mg/dL (ref 6–23)
BUN: 24 mg/dL — ABNORMAL HIGH (ref 6–23)
CO2: 25 mEq/L (ref 19–32)
CO2: 27 mEq/L (ref 19–32)
Calcium: 8.6 mg/dL (ref 8.4–10.5)
Calcium: 8.7 mg/dL (ref 8.4–10.5)
Chloride: 109 mEq/L (ref 96–112)
Chloride: 113 mEq/L — ABNORMAL HIGH (ref 96–112)
Creatinine, Ser: 1.63 mg/dL — ABNORMAL HIGH (ref 0.4–1.2)
Creatinine, Ser: 1.88 mg/dL — ABNORMAL HIGH (ref 0.4–1.2)
GFR calc Af Amer: 28 mL/min — ABNORMAL LOW (ref >60)
GFR calc Af Amer: 38 mL/min — ABNORMAL LOW (ref >60)
GFR calc non Af Amer: 23 mL/min — ABNORMAL LOW (ref >60)
GFR calc non Af Amer: 26 mL/min — ABNORMAL LOW (ref >60)
GFR calc non Af Amer: 31 mL/min — ABNORMAL LOW (ref >60)
GFR calc non Af Amer: 32 mL/min — ABNORMAL LOW (ref >60)
Glucose, Bld: 182 mg/dL — ABNORMAL HIGH (ref 70–99)
Glucose, Bld: 78 mg/dL (ref 70–99)
Potassium: 4.1 mEq/L (ref 3.5–5.1)
Potassium: 4.3 mEq/L (ref 3.5–5.1)
Potassium: 4.5 mEq/L (ref 3.5–5.1)
Sodium: 144 mEq/L (ref 135–145)
Sodium: 144 mEq/L (ref 135–145)
Sodium: 145 mEq/L (ref 135–145)

## 2011-01-01 LAB — GLUCOSE, CAPILLARY
Glucose-Capillary: 146 mg/dL — ABNORMAL HIGH (ref 70–99)
Glucose-Capillary: 182 mg/dL — ABNORMAL HIGH (ref 70–99)
Glucose-Capillary: 190 mg/dL — ABNORMAL HIGH (ref 70–99)
Glucose-Capillary: 190 mg/dL — ABNORMAL HIGH (ref 70–99)
Glucose-Capillary: 197 mg/dL — ABNORMAL HIGH (ref 70–99)
Glucose-Capillary: 266 mg/dL — ABNORMAL HIGH (ref 70–99)
Glucose-Capillary: 289 mg/dL — ABNORMAL HIGH (ref 70–99)
Glucose-Capillary: 364 mg/dL — ABNORMAL HIGH (ref 70–99)
Glucose-Capillary: 513 mg/dL — ABNORMAL HIGH (ref 70–99)

## 2011-01-01 LAB — POCT I-STAT, CHEM 8
BUN: 33 mg/dL — ABNORMAL HIGH (ref 6–23)
Calcium, Ion: 1.08 mmol/L — ABNORMAL LOW (ref 1.12–1.32)
Chloride: 107 mEq/L (ref 96–112)
Glucose, Bld: 231 mg/dL — ABNORMAL HIGH (ref 70–99)
TCO2: 30 mmol/L (ref 0–100)

## 2011-01-01 LAB — CBC
HCT: 35.4 % — ABNORMAL LOW (ref 36.0–46.0)
HCT: 36.3 % (ref 36.0–46.0)
HCT: 36.8 % (ref 36.0–46.0)
HCT: 38 % (ref 36.0–46.0)
Hemoglobin: 10.8 g/dL — ABNORMAL LOW (ref 12.0–15.0)
Hemoglobin: 11 g/dL — ABNORMAL LOW (ref 12.0–15.0)
Hemoglobin: 11.2 g/dL — ABNORMAL LOW (ref 12.0–15.0)
MCH: 27.4 pg (ref 26.0–34.0)
MCHC: 30.4 g/dL (ref 30.0–36.0)
MCHC: 30.9 g/dL (ref 30.0–36.0)
MCV: 87.9 fL (ref 78.0–100.0)
MCV: 88.5 fL (ref 78.0–100.0)
MCV: 89 fL (ref 78.0–100.0)
Platelets: 172 10*3/uL (ref 150–400)
Platelets: 173 10*3/uL (ref 150–400)
Platelets: 219 10*3/uL (ref 150–400)
RBC: 4.26 MIL/uL (ref 3.87–5.11)
RDW: 17.4 % — ABNORMAL HIGH (ref 11.5–15.5)
RDW: 17.8 % — ABNORMAL HIGH (ref 11.5–15.5)
WBC: 11.9 10*3/uL — ABNORMAL HIGH (ref 4.0–10.5)
WBC: 6.2 10*3/uL (ref 4.0–10.5)
WBC: 9.3 10*3/uL (ref 4.0–10.5)

## 2011-01-01 LAB — URINE CULTURE
Colony Count: NO GROWTH
Culture  Setup Time: 201112051522

## 2011-01-01 LAB — URINALYSIS, ROUTINE W REFLEX MICROSCOPIC
Bilirubin Urine: NEGATIVE
Glucose, UA: 250 mg/dL — AB
Ketones, ur: NEGATIVE mg/dL
Protein, ur: 100 mg/dL — AB

## 2011-01-01 LAB — URINE MICROSCOPIC-ADD ON

## 2011-01-01 LAB — POCT CARDIAC MARKERS: Troponin i, poc: <0.05 ng/mL (ref 0.00–0.09)

## 2011-01-01 LAB — DIFFERENTIAL
Basophils Absolute: 0 10*3/uL (ref 0.0–0.1)
Eosinophils Relative: 0 % (ref 0–5)
Lymphs Abs: 1.1 10*3/uL (ref 0.7–4.0)
Monocytes Absolute: 1.3 10*3/uL — ABNORMAL HIGH (ref 0.1–1.0)
Neutro Abs: 13.6 10*3/uL — ABNORMAL HIGH (ref 1.7–7.7)

## 2011-01-01 LAB — CULTURE, BLOOD (ROUTINE X 2)
Culture  Setup Time: 201112051415
Culture  Setup Time: 201112051416
Culture: NO GROWTH

## 2011-01-01 LAB — LACTIC ACID, PLASMA: Lactic Acid, Venous: 1.6 mmol/L (ref 0.5–2.2)

## 2011-01-02 LAB — COMPREHENSIVE METABOLIC PANEL
ALT: 10 U/L (ref 0–35)
Albumin: 3.9 g/dL (ref 3.5–5.2)
Alkaline Phosphatase: 42 U/L (ref 39–117)
Chloride: 105 mEq/L (ref 96–112)
Glucose, Bld: 131 mg/dL — ABNORMAL HIGH (ref 70–99)
Potassium: 4.1 mEq/L (ref 3.5–5.1)
Sodium: 143 mEq/L (ref 135–145)
Total Bilirubin: 0.8 mg/dL (ref 0.3–1.2)
Total Protein: 6.3 g/dL (ref 6.0–8.3)

## 2011-01-02 LAB — URINALYSIS, ROUTINE W REFLEX MICROSCOPIC
Bilirubin Urine: NEGATIVE
Glucose, UA: NEGATIVE mg/dL
Hgb urine dipstick: NEGATIVE
Specific Gravity, Urine: 1.022 (ref 1.005–1.030)

## 2011-01-02 LAB — URINE MICROSCOPIC-ADD ON

## 2011-01-02 LAB — URINE CULTURE

## 2011-01-03 LAB — SIROLIMUS LEVEL: Sirolimus (Rapamycin): 6.6 ng/mL

## 2011-01-03 LAB — URINALYSIS, ROUTINE W REFLEX MICROSCOPIC
Bilirubin Urine: NEGATIVE
Ketones, ur: NEGATIVE mg/dL
Leukocytes, UA: NEGATIVE
Nitrite: NEGATIVE
Nitrite: NEGATIVE
Protein, ur: 100 mg/dL — AB
Specific Gravity, Urine: 1.023 (ref 1.005–1.030)
Urobilinogen, UA: 0.2 mg/dL (ref 0.0–1.0)
pH: 7 (ref 5.0–8.0)

## 2011-01-03 LAB — TSH: TSH: 3.714 u[IU]/mL (ref 0.350–4.500)

## 2011-01-03 LAB — URINE CULTURE
Colony Count: 9000
Colony Count: NO GROWTH
Culture: NO GROWTH

## 2011-01-03 LAB — IRON AND TIBC
Iron: 68 ug/dL (ref 42–135)
Saturation Ratios: 32 % (ref 20–55)
TIBC: 214 ug/dL — ABNORMAL LOW (ref 250–470)
UIBC: 146 ug/dL

## 2011-01-03 LAB — COMPREHENSIVE METABOLIC PANEL
AST: 24 U/L (ref 0–37)
Albumin: 3.7 g/dL (ref 3.5–5.2)
Alkaline Phosphatase: 53 U/L (ref 39–117)
BUN: 26 mg/dL — ABNORMAL HIGH (ref 6–23)
Calcium: 8.9 mg/dL (ref 8.4–10.5)
Calcium: 9.4 mg/dL (ref 8.4–10.5)
Creatinine, Ser: 1.69 mg/dL — ABNORMAL HIGH (ref 0.4–1.2)
Creatinine, Ser: 1.95 mg/dL — ABNORMAL HIGH (ref 0.4–1.2)
GFR calc Af Amer: 36 mL/min — ABNORMAL LOW (ref >60)
GFR calc non Af Amer: 30 mL/min — ABNORMAL LOW (ref >60)
Glucose, Bld: 81 mg/dL (ref 70–99)
Total Protein: 6.5 g/dL (ref 6.0–8.3)

## 2011-01-03 LAB — POCT HEMOGLOBIN-HEMACUE
Hemoglobin: 11.1 g/dL — ABNORMAL LOW (ref 12.0–15.0)
Hemoglobin: 13.2 g/dL (ref 12.0–15.0)

## 2011-01-03 LAB — CBC
MCH: 27.4 pg (ref 26.0–34.0)
MCHC: 31.1 g/dL (ref 30.0–36.0)
Platelets: 173 10*3/uL (ref 150–400)

## 2011-01-03 LAB — DIFFERENTIAL
Eosinophils Relative: 1 % (ref 0–5)
Lymphocytes Relative: 16 % (ref 12–46)
Lymphs Abs: 1 10*3/uL (ref 0.7–4.0)
Monocytes Absolute: 0.3 10*3/uL (ref 0.1–1.0)

## 2011-01-03 LAB — GLUCOSE, CAPILLARY: Glucose-Capillary: 97 mg/dL (ref 70–99)

## 2011-01-03 LAB — PHOSPHORUS: Phosphorus: 3.7 mg/dL (ref 2.3–4.6)

## 2011-01-03 LAB — HEMOGLOBIN A1C
Hgb A1c MFr Bld: 8.7 % — ABNORMAL HIGH (ref <5.7)
Mean Plasma Glucose: 203 mg/dL — ABNORMAL HIGH (ref <117)

## 2011-01-03 LAB — FERRITIN: Ferritin: 1044 ng/mL — ABNORMAL HIGH (ref 10–291)

## 2011-01-03 LAB — URINE MICROSCOPIC-ADD ON

## 2011-01-03 LAB — LIPASE, BLOOD: Lipase: 17 U/L (ref 11–59)

## 2011-01-04 LAB — COMPREHENSIVE METABOLIC PANEL
ALT: 16 U/L (ref 0–35)
ALT: 15 U/L (ref 0–35)
AST: 20 U/L (ref 0–37)
Albumin: 3.8 g/dL (ref 3.5–5.2)
Alkaline Phosphatase: 58 U/L (ref 39–117)
BUN: 20 mg/dL (ref 6–23)
CO2: 27 mEq/L (ref 19–32)
Calcium: 8.9 mg/dL (ref 8.4–10.5)
Chloride: 105 mEq/L (ref 96–112)
GFR calc non Af Amer: 26 mL/min — ABNORMAL LOW (ref >60)
Glucose, Bld: 218 mg/dL — ABNORMAL HIGH (ref 70–99)
Potassium: 3.9 mEq/L (ref 3.5–5.1)
Sodium: 143 mEq/L (ref 135–145)
Sodium: 143 mEq/L (ref 135–145)
Total Protein: 6.4 g/dL (ref 6.0–8.3)
Total Protein: 6.5 g/dL (ref 6.0–8.3)

## 2011-01-04 LAB — URINALYSIS, ROUTINE W REFLEX MICROSCOPIC
Glucose, UA: 250 mg/dL — AB
Glucose, UA: NEGATIVE mg/dL
Hgb urine dipstick: NEGATIVE
Ketones, ur: NEGATIVE mg/dL
Ketones, ur: NEGATIVE mg/dL
Protein, ur: NEGATIVE mg/dL
Urobilinogen, UA: 0.2 mg/dL (ref 0.0–1.0)
pH: 5.5 (ref 5.0–8.0)

## 2011-01-04 LAB — PHOSPHORUS: Phosphorus: 4.7 mg/dL — ABNORMAL HIGH (ref 2.3–4.6)

## 2011-01-04 LAB — URINE CULTURE

## 2011-01-04 LAB — URINE MICROSCOPIC-ADD ON

## 2011-01-04 LAB — IRON AND TIBC
Iron: 87 ug/dL (ref 42–135)
TIBC: 231 ug/dL — ABNORMAL LOW (ref 250–470)
UIBC: 144 ug/dL

## 2011-01-04 LAB — FERRITIN
Ferritin: 1013 ng/mL — ABNORMAL HIGH (ref 10–291)
Ferritin: 840 ng/mL — ABNORMAL HIGH (ref 10–291)

## 2011-01-04 LAB — SIROLIMUS LEVEL: Sirolimus (Rapamycin): 9.3 ng/mL

## 2011-01-04 LAB — POCT HEMOGLOBIN-HEMACUE: Hemoglobin: 12.1 g/dL (ref 12.0–15.0)

## 2011-01-06 LAB — COMPREHENSIVE METABOLIC PANEL
Albumin: 3.7 g/dL (ref 3.5–5.2)
BUN: 25 mg/dL — ABNORMAL HIGH (ref 6–23)
Chloride: 102 mEq/L (ref 96–112)
Creatinine, Ser: 1.61 mg/dL — ABNORMAL HIGH (ref 0.4–1.2)
GFR calc non Af Amer: 32 mL/min — ABNORMAL LOW (ref >60)
Total Bilirubin: 0.8 mg/dL (ref 0.3–1.2)

## 2011-01-06 LAB — URINE CULTURE

## 2011-01-06 LAB — URINALYSIS, ROUTINE W REFLEX MICROSCOPIC
Ketones, ur: NEGATIVE mg/dL
Nitrite: NEGATIVE
Protein, ur: 30 mg/dL — AB
pH: 5.5 (ref 5.0–8.0)

## 2011-01-06 LAB — POCT HEMOGLOBIN-HEMACUE: Hemoglobin: 11.5 g/dL — ABNORMAL LOW (ref 12.0–15.0)

## 2011-01-06 LAB — SIROLIMUS LEVEL: Sirolimus (Rapamycin): 10.6 ng/mL

## 2011-01-07 LAB — URINE CULTURE: Colony Count: 30000

## 2011-01-07 LAB — PTH, INTACT AND CALCIUM: PTH: 19.9 pg/mL (ref 14.0–72.0)

## 2011-01-07 LAB — URINALYSIS, ROUTINE W REFLEX MICROSCOPIC
Bilirubin Urine: NEGATIVE
Glucose, UA: 250 mg/dL — AB
Ketones, ur: NEGATIVE mg/dL
Protein, ur: 100 mg/dL — AB
pH: 6 (ref 5.0–8.0)

## 2011-01-07 LAB — FERRITIN: Ferritin: 922 ng/mL — ABNORMAL HIGH (ref 10–291)

## 2011-01-07 LAB — COMPREHENSIVE METABOLIC PANEL
ALT: 17 U/L (ref 0–35)
AST: 23 U/L (ref 0–37)
Calcium: 9.1 mg/dL (ref 8.4–10.5)
GFR calc Af Amer: 34 mL/min — ABNORMAL LOW (ref >60)
Sodium: 145 mEq/L (ref 135–145)
Total Protein: 6.7 g/dL (ref 6.0–8.3)

## 2011-01-07 LAB — URINE MICROSCOPIC-ADD ON

## 2011-01-07 LAB — PHOSPHORUS: Phosphorus: 3.8 mg/dL (ref 2.3–4.6)

## 2011-01-07 LAB — IRON AND TIBC
Iron: 53 ug/dL (ref 42–135)
UIBC: 166 ug/dL

## 2011-01-07 LAB — HEMOGLOBIN A1C
Hgb A1c MFr Bld: 8.9 % — ABNORMAL HIGH (ref <5.7)
Mean Plasma Glucose: 209 mg/dL — ABNORMAL HIGH (ref <117)

## 2011-01-08 LAB — URINE CULTURE

## 2011-01-08 LAB — URINALYSIS, ROUTINE W REFLEX MICROSCOPIC
Bilirubin Urine: NEGATIVE
Specific Gravity, Urine: 1.019 (ref 1.005–1.030)
Urobilinogen, UA: 0.2 mg/dL (ref 0.0–1.0)
pH: 6 (ref 5.0–8.0)

## 2011-01-08 LAB — URINE MICROSCOPIC-ADD ON

## 2011-01-08 LAB — POCT HEMOGLOBIN-HEMACUE
Hemoglobin: 10.9 g/dL — ABNORMAL LOW (ref 12.0–15.0)
Hemoglobin: 12.3 g/dL (ref 12.0–15.0)

## 2011-01-08 LAB — TSH: TSH: 1.991 u[IU]/mL (ref 0.350–4.500)

## 2011-01-09 ENCOUNTER — Encounter (HOSPITAL_COMMUNITY)
Admission: RE | Admit: 2011-01-09 | Discharge: 2011-01-09 | Disposition: A | Discharge status: Home / Self Care | Payer: Medicare Other | Source: Ambulatory Visit | Attending: Nephrology | Admitting: Nephrology

## 2011-01-09 ENCOUNTER — Other Ambulatory Visit: Payer: Self-pay | Admitting: Nephrology

## 2011-01-09 DIAGNOSIS — N2581 Secondary hyperparathyroidism of renal origin: Secondary | ICD-10-CM | POA: Insufficient documentation

## 2011-01-09 DIAGNOSIS — E119 Type 2 diabetes mellitus without complications: Secondary | ICD-10-CM | POA: Insufficient documentation

## 2011-01-09 DIAGNOSIS — D638 Anemia in other chronic diseases classified elsewhere: Secondary | ICD-10-CM | POA: Insufficient documentation

## 2011-01-09 DIAGNOSIS — N183 Chronic kidney disease, stage 3 unspecified: Secondary | ICD-10-CM | POA: Insufficient documentation

## 2011-01-09 LAB — URINALYSIS, ROUTINE W REFLEX MICROSCOPIC
Bilirubin Urine: NEGATIVE
Hgb urine dipstick: NEGATIVE
Ketones, ur: NEGATIVE mg/dL
Nitrite: NEGATIVE
Nitrite: NEGATIVE
Protein, ur: 30 mg/dL — AB
Specific Gravity, Urine: 1.024 (ref 1.005–1.030)
Urobilinogen, UA: 0.2 mg/dL (ref 0.0–1.0)
pH: 5.5 (ref 5.0–8.0)
pH: 5.5 (ref 5.0–8.0)

## 2011-01-09 LAB — URINE CULTURE

## 2011-01-09 LAB — URINE MICROSCOPIC-ADD ON

## 2011-01-09 LAB — IRON AND TIBC
Iron: 83 ug/dL (ref 42–135)
Saturation Ratios: 32 % (ref 20–55)
Saturation Ratios: 26 % (ref 20–55)
TIBC: 259 ug/dL (ref 250–470)
UIBC: 173 ug/dL

## 2011-01-09 LAB — COMPREHENSIVE METABOLIC PANEL
BUN: 30 mg/dL — ABNORMAL HIGH (ref 6–23)
CO2: 27 mEq/L (ref 19–32)
Calcium: 9 mg/dL (ref 8.4–10.5)
Creatinine, Ser: 1.73 mg/dL — ABNORMAL HIGH (ref 0.4–1.2)
GFR calc non Af Amer: 29 mL/min — ABNORMAL LOW (ref >60)
Glucose, Bld: 199 mg/dL — ABNORMAL HIGH (ref 70–99)
Total Protein: 6.5 g/dL (ref 6.0–8.3)

## 2011-01-09 LAB — FERRITIN: Ferritin: 844 ng/mL — ABNORMAL HIGH (ref 10–291)

## 2011-01-09 LAB — PHOSPHORUS
Phosphorus: 4.1 mg/dL (ref 2.3–4.6)
Phosphorus: 3.9 mg/dL (ref 2.3–4.6)

## 2011-01-09 LAB — SIROLIMUS LEVEL: Sirolimus (Rapamycin): 6.5 ng/mL

## 2011-01-09 LAB — POCT HEMOGLOBIN-HEMACUE: Hemoglobin: 11 g/dL — ABNORMAL LOW (ref 12.0–15.0)

## 2011-01-10 LAB — URINE CULTURE: Colony Count: 9000

## 2011-01-11 LAB — URINALYSIS, ROUTINE W REFLEX MICROSCOPIC
Glucose, UA: NEGATIVE mg/dL
Ketones, ur: NEGATIVE mg/dL
Nitrite: NEGATIVE
Specific Gravity, Urine: 1.025 (ref 1.005–1.030)
pH: 5.5 (ref 5.0–8.0)

## 2011-01-11 LAB — COMPREHENSIVE METABOLIC PANEL
ALT: 31 U/L (ref 0–35)
CO2: 30 mEq/L (ref 19–32)
Calcium: 8.9 mg/dL (ref 8.4–10.5)
Chloride: 103 mEq/L (ref 96–112)
Creatinine, Ser: 1.83 mg/dL — ABNORMAL HIGH (ref 0.4–1.2)
GFR calc non Af Amer: 27 mL/min — ABNORMAL LOW (ref >60)
Glucose, Bld: 177 mg/dL — ABNORMAL HIGH (ref 70–99)
Sodium: 143 mEq/L (ref 135–145)
Total Bilirubin: 1.1 mg/dL (ref 0.3–1.2)

## 2011-01-11 LAB — URINE CULTURE: Colony Count: NO GROWTH

## 2011-01-11 LAB — PTH, INTACT AND CALCIUM: PTH: 23.5 pg/mL (ref 14.0–72.0)

## 2011-01-11 LAB — URINE MICROSCOPIC-ADD ON

## 2011-01-11 LAB — HEMOGLOBIN A1C: Hgb A1c MFr Bld: 8.5 % — ABNORMAL HIGH (ref 4.6–6.1)

## 2011-01-11 LAB — POCT HEMOGLOBIN-HEMACUE: Hemoglobin: 11.7 g/dL — ABNORMAL LOW (ref 12.0–15.0)

## 2011-01-14 LAB — COMPREHENSIVE METABOLIC PANEL
ALT: 15 U/L (ref 0–35)
AST: 21 U/L (ref 0–37)
Albumin: 3.7 g/dL (ref 3.5–5.2)
CO2: 28 mEq/L (ref 19–32)
Chloride: 104 mEq/L (ref 96–112)
GFR calc Af Amer: 36 mL/min — ABNORMAL LOW (ref >60)
GFR calc non Af Amer: 29 mL/min — ABNORMAL LOW (ref >60)
Potassium: 3.6 mEq/L (ref 3.5–5.1)
Sodium: 142 mEq/L (ref 135–145)
Total Bilirubin: 0.9 mg/dL (ref 0.3–1.2)

## 2011-01-14 LAB — URINE CULTURE

## 2011-01-14 LAB — URINALYSIS, ROUTINE W REFLEX MICROSCOPIC
Bilirubin Urine: NEGATIVE
Nitrite: NEGATIVE
Protein, ur: 30 mg/dL — AB
Specific Gravity, Urine: 1.023 (ref 1.005–1.030)
Urobilinogen, UA: 0.2 mg/dL (ref 0.0–1.0)

## 2011-01-14 LAB — SIROLIMUS LEVEL: Sirolimus (Rapamycin): 5.1 ng/mL

## 2011-01-14 LAB — TSH: TSH: 11.696 u[IU]/mL — ABNORMAL HIGH (ref 0.350–4.500)

## 2011-01-14 LAB — URINE MICROSCOPIC-ADD ON

## 2011-01-14 LAB — POCT HEMOGLOBIN-HEMACUE: Hemoglobin: 13.1 g/dL (ref 12.0–15.0)

## 2011-01-14 LAB — PHOSPHORUS: Phosphorus: 4.2 mg/dL (ref 2.3–4.6)

## 2011-01-14 LAB — IRON AND TIBC: Saturation Ratios: 28 % (ref 20–55)

## 2011-01-21 LAB — COMPREHENSIVE METABOLIC PANEL
BUN: 23 mg/dL (ref 6–23)
CO2: 28 mEq/L (ref 19–32)
Calcium: 9.2 mg/dL (ref 8.4–10.5)
Chloride: 107 mEq/L (ref 96–112)
Creatinine, Ser: 1.6 mg/dL — ABNORMAL HIGH (ref 0.4–1.2)
GFR calc non Af Amer: 32 mL/min — ABNORMAL LOW (ref >60)
Glucose, Bld: 62 mg/dL — ABNORMAL LOW (ref 70–99)
Total Bilirubin: 0.8 mg/dL (ref 0.3–1.2)

## 2011-01-21 LAB — URINE CULTURE

## 2011-01-21 LAB — IRON AND TIBC
Iron: 66 ug/dL (ref 42–135)
Saturation Ratios: 30 % (ref 20–55)
TIBC: 221 ug/dL — ABNORMAL LOW (ref 250–470)

## 2011-01-21 LAB — URINALYSIS, ROUTINE W REFLEX MICROSCOPIC
Bilirubin Urine: NEGATIVE
Glucose, UA: 100 mg/dL — AB
Nitrite: NEGATIVE
Specific Gravity, Urine: 1.023 (ref 1.005–1.030)
pH: 6 (ref 5.0–8.0)

## 2011-01-21 LAB — URINE MICROSCOPIC-ADD ON

## 2011-01-21 LAB — SIROLIMUS LEVEL: Sirolimus (Rapamycin): 7.5 ng/mL

## 2011-01-22 LAB — COMPREHENSIVE METABOLIC PANEL
ALT: 34 U/L (ref 0–35)
Albumin: 3.6 g/dL (ref 3.5–5.2)
Alkaline Phosphatase: 61 U/L (ref 39–117)
Glucose, Bld: 99 mg/dL (ref 70–99)
Potassium: 4.2 mEq/L (ref 3.5–5.1)
Sodium: 145 mEq/L (ref 135–145)
Total Protein: 6.6 g/dL (ref 6.0–8.3)

## 2011-01-22 LAB — URINALYSIS, ROUTINE W REFLEX MICROSCOPIC
Leukocytes, UA: NEGATIVE
Protein, ur: 30 mg/dL — AB
Urobilinogen, UA: 0.2 mg/dL (ref 0.0–1.0)

## 2011-01-22 LAB — URINE CULTURE: Culture: NO GROWTH

## 2011-01-22 LAB — URINE MICROSCOPIC-ADD ON

## 2011-01-22 LAB — FERRITIN: Ferritin: 959 ng/mL — ABNORMAL HIGH (ref 10–291)

## 2011-01-22 LAB — PHOSPHORUS: Phosphorus: 4 mg/dL (ref 2.3–4.6)

## 2011-01-23 LAB — COMPREHENSIVE METABOLIC PANEL
ALT: 36 U/L — ABNORMAL HIGH (ref 0–35)
AST: 30 U/L (ref 0–37)
Calcium: 9.6 mg/dL (ref 8.4–10.5)
GFR calc Af Amer: 35 mL/min — ABNORMAL LOW (ref >60)
Glucose, Bld: 91 mg/dL (ref 70–99)
Sodium: 149 mEq/L — ABNORMAL HIGH (ref 135–145)
Total Protein: 6.4 g/dL (ref 6.0–8.3)

## 2011-01-23 LAB — URINALYSIS, ROUTINE W REFLEX MICROSCOPIC
Bilirubin Urine: NEGATIVE
Glucose, UA: NEGATIVE mg/dL
Ketones, ur: NEGATIVE mg/dL
Protein, ur: 30 mg/dL — AB
pH: 6 (ref 5.0–8.0)

## 2011-01-23 LAB — URINE CULTURE

## 2011-01-23 LAB — IRON AND TIBC
Iron: 59 ug/dL (ref 42–135)
Saturation Ratios: 25 % (ref 20–55)
UIBC: 178 ug/dL

## 2011-01-23 LAB — TSH: TSH: 4.634 u[IU]/mL — ABNORMAL HIGH (ref 0.350–4.500)

## 2011-01-23 LAB — FERRITIN: Ferritin: 734 ng/mL — ABNORMAL HIGH (ref 10–291)

## 2011-01-23 LAB — URINE MICROSCOPIC-ADD ON

## 2011-01-23 LAB — PHOSPHORUS: Phosphorus: 4.4 mg/dL (ref 2.3–4.6)

## 2011-01-24 LAB — URINE MICROSCOPIC-ADD ON

## 2011-01-24 LAB — CULTURE, BLOOD (ROUTINE X 2): Culture: NO GROWTH

## 2011-01-24 LAB — IRON AND TIBC
Iron: 86 ug/dL (ref 42–135)
Saturation Ratios: 34 % (ref 20–55)
TIBC: 250 ug/dL (ref 250–470)
UIBC: 164 ug/dL

## 2011-01-24 LAB — CBC
HCT: 30.3 % — ABNORMAL LOW (ref 36.0–46.0)
HCT: 39.4 % (ref 36.0–46.0)
MCHC: 33 g/dL (ref 30.0–36.0)
MCHC: 33.3 g/dL (ref 30.0–36.0)
MCV: 84.1 fL (ref 78.0–100.0)
MCV: 85.3 fL (ref 78.0–100.0)
Platelets: 156 10*3/uL (ref 150–400)
Platelets: 157 10*3/uL (ref 150–400)
Platelets: 166 10*3/uL (ref 150–400)
RBC: 4 MIL/uL (ref 3.87–5.11)
RDW: 17.7 % — ABNORMAL HIGH (ref 11.5–15.5)
WBC: 11.4 10*3/uL — ABNORMAL HIGH (ref 4.0–10.5)

## 2011-01-24 LAB — BASIC METABOLIC PANEL
BUN: 20 mg/dL (ref 6–23)
Chloride: 106 mEq/L (ref 96–112)
Creatinine, Ser: 2.07 mg/dL — ABNORMAL HIGH (ref 0.4–1.2)
GFR calc non Af Amer: 24 mL/min — ABNORMAL LOW (ref >60)
Glucose, Bld: 146 mg/dL — ABNORMAL HIGH (ref 70–99)

## 2011-01-24 LAB — GLUCOSE, CAPILLARY
Glucose-Capillary: 110 mg/dL — ABNORMAL HIGH (ref 70–99)
Glucose-Capillary: 127 mg/dL — ABNORMAL HIGH (ref 70–99)
Glucose-Capillary: 129 mg/dL — ABNORMAL HIGH (ref 70–99)
Glucose-Capillary: 149 mg/dL — ABNORMAL HIGH (ref 70–99)
Glucose-Capillary: 182 mg/dL — ABNORMAL HIGH (ref 70–99)
Glucose-Capillary: 193 mg/dL — ABNORMAL HIGH (ref 70–99)
Glucose-Capillary: 197 mg/dL — ABNORMAL HIGH (ref 70–99)
Glucose-Capillary: 215 mg/dL — ABNORMAL HIGH (ref 70–99)
Glucose-Capillary: 227 mg/dL — ABNORMAL HIGH (ref 70–99)
Glucose-Capillary: 243 mg/dL — ABNORMAL HIGH (ref 70–99)
Glucose-Capillary: 265 mg/dL — ABNORMAL HIGH (ref 70–99)
Glucose-Capillary: 61 mg/dL — ABNORMAL LOW (ref 70–99)

## 2011-01-24 LAB — COMPREHENSIVE METABOLIC PANEL
AST: 19 U/L (ref 0–37)
Albumin: 2.4 g/dL — ABNORMAL LOW (ref 3.5–5.2)
BUN: 28 mg/dL — ABNORMAL HIGH (ref 6–23)
BUN: 28 mg/dL — ABNORMAL HIGH (ref 6–23)
BUN: 32 mg/dL — ABNORMAL HIGH (ref 6–23)
CO2: 27 mEq/L (ref 19–32)
CO2: 29 mEq/L (ref 19–32)
Calcium: 7.8 mg/dL — ABNORMAL LOW (ref 8.4–10.5)
Calcium: 8.4 mg/dL (ref 8.4–10.5)
Calcium: 9.1 mg/dL (ref 8.4–10.5)
Creatinine, Ser: 1.88 mg/dL — ABNORMAL HIGH (ref 0.4–1.2)
Creatinine, Ser: 2.25 mg/dL — ABNORMAL HIGH (ref 0.4–1.2)
Creatinine, Ser: 2.61 mg/dL — ABNORMAL HIGH (ref 0.4–1.2)
GFR calc Af Amer: 22 mL/min — ABNORMAL LOW (ref >60)
GFR calc non Af Amer: 18 mL/min — ABNORMAL LOW (ref >60)
GFR calc non Af Amer: 26 mL/min — ABNORMAL LOW (ref >60)
Glucose, Bld: 154 mg/dL — ABNORMAL HIGH (ref 70–99)
Glucose, Bld: 175 mg/dL — ABNORMAL HIGH (ref 70–99)
Total Protein: 6 g/dL (ref 6.0–8.3)
Total Protein: 6.6 g/dL (ref 6.0–8.3)

## 2011-01-24 LAB — URINALYSIS, ROUTINE W REFLEX MICROSCOPIC
Bilirubin Urine: NEGATIVE
Bilirubin Urine: NEGATIVE
Glucose, UA: NEGATIVE mg/dL
Hgb urine dipstick: NEGATIVE
Ketones, ur: NEGATIVE mg/dL
Ketones, ur: NEGATIVE mg/dL
Nitrite: POSITIVE — AB
Urobilinogen, UA: 0.2 mg/dL (ref 0.0–1.0)
pH: 5.5 (ref 5.0–8.0)

## 2011-01-24 LAB — POCT I-STAT, CHEM 8
Calcium, Ion: 1.01 mmol/L — ABNORMAL LOW (ref 1.12–1.32)
Glucose, Bld: 188 mg/dL — ABNORMAL HIGH (ref 70–99)
HCT: 42 % (ref 36.0–46.0)
Hemoglobin: 14.3 g/dL (ref 12.0–15.0)
TCO2: 30 mmol/L (ref 0–100)

## 2011-01-24 LAB — URINE CULTURE
Colony Count: 65000
Colony Count: >=100000

## 2011-01-24 LAB — BRAIN NATRIURETIC PEPTIDE: Pro B Natriuretic peptide (BNP): 125 pg/mL — ABNORMAL HIGH (ref 0.0–100.0)

## 2011-01-24 LAB — DIFFERENTIAL
Eosinophils Relative: 0 % (ref 0–5)
Lymphocytes Relative: 2 % — ABNORMAL LOW (ref 12–46)
Lymphs Abs: 0.2 10*3/uL — ABNORMAL LOW (ref 0.7–4.0)
Neutrophils Relative %: 90 % — ABNORMAL HIGH (ref 43–77)

## 2011-01-24 LAB — QUANTIFERON TB GOLD ASSAY (BLOOD): Interferon Gamma Release Assay: UNDETERMINED — AB

## 2011-01-24 LAB — PHOSPHORUS: Phosphorus: 2.1 mg/dL — ABNORMAL LOW (ref 2.3–4.6)

## 2011-01-25 LAB — URINALYSIS, ROUTINE W REFLEX MICROSCOPIC
Ketones, ur: NEGATIVE mg/dL
Nitrite: NEGATIVE
Protein, ur: NEGATIVE mg/dL
Urobilinogen, UA: 0.2 mg/dL (ref 0.0–1.0)

## 2011-01-25 LAB — COMPREHENSIVE METABOLIC PANEL
ALT: 21 U/L (ref 0–35)
AST: 25 U/L (ref 0–37)
Alkaline Phosphatase: 68 U/L (ref 39–117)
Calcium: 9.1 mg/dL (ref 8.4–10.5)
GFR calc Af Amer: 29 mL/min — ABNORMAL LOW (ref >60)
Glucose, Bld: 179 mg/dL — ABNORMAL HIGH (ref 70–99)
Potassium: 3.7 mEq/L (ref 3.5–5.1)
Sodium: 142 mEq/L (ref 135–145)
Total Protein: 6.6 g/dL (ref 6.0–8.3)

## 2011-01-25 LAB — IRON AND TIBC
Iron: 57 ug/dL (ref 42–135)
UIBC: 170 ug/dL

## 2011-01-25 LAB — URINE MICROSCOPIC-ADD ON

## 2011-01-25 LAB — FERRITIN: Ferritin: 925 ng/mL — ABNORMAL HIGH (ref 10–291)

## 2011-01-25 LAB — SIROLIMUS LEVEL: Sirolimus (Rapamycin): 7.3 (ref 3.0–18.0)

## 2011-01-25 LAB — POCT HEMOGLOBIN-HEMACUE: Hemoglobin: 11.4 g/dL — ABNORMAL LOW (ref 12.0–15.0)

## 2011-01-25 LAB — URINE CULTURE: Culture: NO GROWTH

## 2011-01-25 LAB — PHOSPHORUS: Phosphorus: 4 mg/dL (ref 2.3–4.6)

## 2011-01-26 LAB — COMPREHENSIVE METABOLIC PANEL
ALT: 16 U/L (ref 0–35)
Albumin: 3.8 g/dL (ref 3.5–5.2)
Calcium: 8 mg/dL — ABNORMAL LOW (ref 8.4–10.5)
GFR calc Af Amer: 37 mL/min — ABNORMAL LOW (ref >60)
Glucose, Bld: 108 mg/dL — ABNORMAL HIGH (ref 70–99)
Potassium: 3.6 mEq/L (ref 3.5–5.1)
Sodium: 140 mEq/L (ref 135–145)
Total Protein: 6.7 g/dL (ref 6.0–8.3)

## 2011-01-26 LAB — URINALYSIS, ROUTINE W REFLEX MICROSCOPIC
Glucose, UA: NEGATIVE mg/dL
Protein, ur: 30 mg/dL — AB
Specific Gravity, Urine: 1.027 (ref 1.005–1.030)
Urobilinogen, UA: 0.2 mg/dL (ref 0.0–1.0)

## 2011-01-26 LAB — URINE CULTURE
Colony Count: NO GROWTH
Culture: NO GROWTH

## 2011-01-26 LAB — URINE MICROSCOPIC-ADD ON

## 2011-01-26 LAB — SIROLIMUS LEVEL: Sirolimus (Rapamycin): 8.8 (ref 3.0–18.0)

## 2011-01-26 LAB — PHOSPHORUS: Phosphorus: 3.5 mg/dL (ref 2.3–4.6)

## 2011-01-27 LAB — URINE CULTURE
Colony Count: 4000
Colony Count: 35000
Colony Count: 9000

## 2011-01-27 LAB — GLUCOSE, CAPILLARY
Glucose-Capillary: 100 mg/dL — ABNORMAL HIGH (ref 70–99)
Glucose-Capillary: 102 mg/dL — ABNORMAL HIGH (ref 70–99)
Glucose-Capillary: 104 mg/dL — ABNORMAL HIGH (ref 70–99)
Glucose-Capillary: 122 mg/dL — ABNORMAL HIGH (ref 70–99)
Glucose-Capillary: 127 mg/dL — ABNORMAL HIGH (ref 70–99)
Glucose-Capillary: 129 mg/dL — ABNORMAL HIGH (ref 70–99)
Glucose-Capillary: 145 mg/dL — ABNORMAL HIGH (ref 70–99)
Glucose-Capillary: 148 mg/dL — ABNORMAL HIGH (ref 70–99)
Glucose-Capillary: 156 mg/dL — ABNORMAL HIGH (ref 70–99)
Glucose-Capillary: 158 mg/dL — ABNORMAL HIGH (ref 70–99)
Glucose-Capillary: 160 mg/dL — ABNORMAL HIGH (ref 70–99)
Glucose-Capillary: 161 mg/dL — ABNORMAL HIGH (ref 70–99)
Glucose-Capillary: 172 mg/dL — ABNORMAL HIGH (ref 70–99)
Glucose-Capillary: 175 mg/dL — ABNORMAL HIGH (ref 70–99)
Glucose-Capillary: 184 mg/dL — ABNORMAL HIGH (ref 70–99)
Glucose-Capillary: 185 mg/dL — ABNORMAL HIGH (ref 70–99)
Glucose-Capillary: 197 mg/dL — ABNORMAL HIGH (ref 70–99)
Glucose-Capillary: 206 mg/dL — ABNORMAL HIGH (ref 70–99)
Glucose-Capillary: 265 mg/dL — ABNORMAL HIGH (ref 70–99)
Glucose-Capillary: 281 mg/dL — ABNORMAL HIGH (ref 70–99)
Glucose-Capillary: 68 mg/dL — ABNORMAL LOW (ref 70–99)
Glucose-Capillary: 70 mg/dL (ref 70–99)
Glucose-Capillary: 83 mg/dL (ref 70–99)
Glucose-Capillary: 92 mg/dL (ref 70–99)

## 2011-01-27 LAB — CBC
HCT: 30.3 % — ABNORMAL LOW (ref 36.0–46.0)
HCT: 30.7 % — ABNORMAL LOW (ref 36.0–46.0)
HCT: 31.5 % — ABNORMAL LOW (ref 36.0–46.0)
HCT: 34.8 % — ABNORMAL LOW (ref 36.0–46.0)
Hemoglobin: 10.1 g/dL — ABNORMAL LOW (ref 12.0–15.0)
Hemoglobin: 10.2 g/dL — ABNORMAL LOW (ref 12.0–15.0)
Hemoglobin: 10.3 g/dL — ABNORMAL LOW (ref 12.0–15.0)
Hemoglobin: 10.5 g/dL — ABNORMAL LOW (ref 12.0–15.0)
Hemoglobin: 11.6 g/dL — ABNORMAL LOW (ref 12.0–15.0)
MCHC: 32.8 g/dL (ref 30.0–36.0)
MCHC: 33.3 g/dL (ref 30.0–36.0)
MCHC: 33.5 g/dL (ref 30.0–36.0)
MCV: 81.1 fL (ref 78.0–100.0)
MCV: 81.3 fL (ref 78.0–100.0)
MCV: 81.9 fL (ref 78.0–100.0)
Platelets: 135 10*3/uL — ABNORMAL LOW (ref 150–400)
Platelets: 144 10*3/uL — ABNORMAL LOW (ref 150–400)
RBC: 3.73 MIL/uL — ABNORMAL LOW (ref 3.87–5.11)
RBC: 3.78 MIL/uL — ABNORMAL LOW (ref 3.87–5.11)
RBC: 3.84 MIL/uL — ABNORMAL LOW (ref 3.87–5.11)
RBC: 4.37 MIL/uL (ref 3.87–5.11)
RDW: 19.1 % — ABNORMAL HIGH (ref 11.5–15.5)
RDW: 19.5 % — ABNORMAL HIGH (ref 11.5–15.5)
WBC: 3.1 10*3/uL — ABNORMAL LOW (ref 4.0–10.5)
WBC: 3.3 10*3/uL — ABNORMAL LOW (ref 4.0–10.5)
WBC: 3.4 10*3/uL — ABNORMAL LOW (ref 4.0–10.5)
WBC: 4.4 10*3/uL (ref 4.0–10.5)

## 2011-01-27 LAB — COMPREHENSIVE METABOLIC PANEL
ALT: 30 U/L (ref 0–35)
ALT: 18 U/L (ref 0–35)
ALT: 28 U/L (ref 0–35)
AST: 21 U/L (ref 0–37)
AST: 17 U/L (ref 0–37)
Albumin: 3.6 g/dL (ref 3.5–5.2)
Alkaline Phosphatase: 58 U/L (ref 39–117)
CO2: 30 mEq/L (ref 19–32)
CO2: 31 mEq/L (ref 19–32)
CO2: 37 mEq/L — ABNORMAL HIGH (ref 19–32)
Calcium: 9.6 mg/dL (ref 8.4–10.5)
Calcium: 8.7 mg/dL (ref 8.4–10.5)
Calcium: 9.6 mg/dL (ref 8.4–10.5)
Chloride: 104 mEq/L (ref 96–112)
Chloride: 106 mEq/L (ref 96–112)
Creatinine, Ser: 1.52 mg/dL — ABNORMAL HIGH (ref 0.4–1.2)
Creatinine, Ser: 1.81 mg/dL — ABNORMAL HIGH (ref 0.4–1.2)
GFR calc Af Amer: 33 mL/min — ABNORMAL LOW (ref >60)
GFR calc Af Amer: 36 mL/min — ABNORMAL LOW (ref >60)
GFR calc non Af Amer: 28 mL/min — ABNORMAL LOW (ref >60)
GFR calc non Af Amer: 30 mL/min — ABNORMAL LOW (ref >60)
GFR calc non Af Amer: 34 mL/min — ABNORMAL LOW (ref >60)
Glucose, Bld: 140 mg/dL — ABNORMAL HIGH (ref 70–99)
Glucose, Bld: 57 mg/dL — ABNORMAL LOW (ref 70–99)
Glucose, Bld: 69 mg/dL — ABNORMAL LOW (ref 70–99)
Potassium: 4.3 mEq/L (ref 3.5–5.1)
Sodium: 144 mEq/L (ref 135–145)
Sodium: 141 mEq/L (ref 135–145)
Sodium: 142 mEq/L (ref 135–145)
Total Bilirubin: 0.7 mg/dL (ref 0.3–1.2)
Total Bilirubin: 0.8 mg/dL (ref 0.3–1.2)
Total Protein: 6.9 g/dL (ref 6.0–8.3)

## 2011-01-27 LAB — LIPASE, BLOOD: Lipase: 14 U/L (ref 11–59)

## 2011-01-27 LAB — URINALYSIS, ROUTINE W REFLEX MICROSCOPIC
Bilirubin Urine: NEGATIVE
Bilirubin Urine: NEGATIVE
Bilirubin Urine: NEGATIVE
Glucose, UA: NEGATIVE mg/dL
Ketones, ur: NEGATIVE mg/dL
Ketones, ur: NEGATIVE mg/dL
Nitrite: NEGATIVE
Nitrite: NEGATIVE
Nitrite: NEGATIVE
Urobilinogen, UA: 0.2 mg/dL (ref 0.0–1.0)
pH: 5.5 (ref 5.0–8.0)

## 2011-01-27 LAB — CULTURE, BLOOD (ROUTINE X 2)
Culture: NO GROWTH
Culture: NO GROWTH
Culture: NO GROWTH
Culture: NO GROWTH

## 2011-01-27 LAB — URINE MICROSCOPIC-ADD ON

## 2011-01-27 LAB — BASIC METABOLIC PANEL
BUN: 17 mg/dL (ref 6–23)
BUN: 17 mg/dL (ref 6–23)
Calcium: 8.4 mg/dL (ref 8.4–10.5)
Chloride: 108 mEq/L (ref 96–112)
Creatinine, Ser: 1.51 mg/dL — ABNORMAL HIGH (ref 0.4–1.2)
GFR calc non Af Amer: 34 mL/min — ABNORMAL LOW (ref >60)
Glucose, Bld: 82 mg/dL (ref 70–99)
Glucose, Bld: 97 mg/dL (ref 70–99)
Potassium: 4.5 mEq/L (ref 3.5–5.1)
Sodium: 144 mEq/L (ref 135–145)

## 2011-01-27 LAB — PHOSPHORUS: Phosphorus: 4.3 mg/dL (ref 2.3–4.6)

## 2011-01-27 LAB — IRON AND TIBC: UIBC: 201 ug/dL

## 2011-01-27 LAB — CK: Total CK: 257 U/L — ABNORMAL HIGH (ref 7–177)

## 2011-01-27 LAB — DIFFERENTIAL
Basophils Absolute: 0 10*3/uL (ref 0.0–0.1)
Eosinophils Absolute: 0 10*3/uL (ref 0.0–0.7)
Eosinophils Absolute: 0 10*3/uL (ref 0.0–0.7)
Eosinophils Relative: 1 % (ref 0–5)
Eosinophils Relative: 1 % (ref 0–5)
Lymphocytes Relative: 9 % — ABNORMAL LOW (ref 12–46)
Lymphs Abs: 0.3 10*3/uL — ABNORMAL LOW (ref 0.7–4.0)
Lymphs Abs: 0.3 10*3/uL — ABNORMAL LOW (ref 0.7–4.0)
Metamyelocytes Relative: 1 %
Monocytes Absolute: 0.6 10*3/uL (ref 0.1–1.0)
Monocytes Relative: 20 % — ABNORMAL HIGH (ref 3–12)
Neutrophils Relative %: 81 % — ABNORMAL HIGH (ref 43–77)
nRBC: 0 /100 WBC

## 2011-01-27 LAB — PTH, INTACT AND CALCIUM: PTH: 10.4 pg/mL — ABNORMAL LOW (ref 14.0–72.0)

## 2011-01-27 LAB — TSH
TSH: 2.882 u[IU]/mL (ref 0.350–4.500)
TSH: 2.468 u[IU]/mL (ref 0.350–4.500)

## 2011-01-27 LAB — HEMOGLOBIN A1C
Hgb A1c MFr Bld: 7.5 % — ABNORMAL HIGH (ref 4.6–6.1)
Mean Plasma Glucose: 169 mg/dL

## 2011-01-27 LAB — CK TOTAL AND CKMB (NOT AT ARMC): Relative Index: 0.7 (ref 0.0–2.5)

## 2011-01-27 LAB — FERRITIN: Ferritin: 1090 ng/mL — ABNORMAL HIGH (ref 10–291)

## 2011-01-28 LAB — COMPREHENSIVE METABOLIC PANEL
BUN: 28 mg/dL — ABNORMAL HIGH (ref 6–23)
CO2: 28 mEq/L (ref 19–32)
Calcium: 9.2 mg/dL (ref 8.4–10.5)
Chloride: 107 mEq/L (ref 96–112)
Creatinine, Ser: 1.62 mg/dL — ABNORMAL HIGH (ref 0.4–1.2)
GFR calc Af Amer: 38 mL/min — ABNORMAL LOW (ref >60)
GFR calc non Af Amer: 31 mL/min — ABNORMAL LOW (ref >60)
Glucose, Bld: 79 mg/dL (ref 70–99)
Total Bilirubin: 0.8 mg/dL (ref 0.3–1.2)

## 2011-01-28 LAB — URINE CULTURE: Colony Count: >=100000

## 2011-01-28 LAB — URINALYSIS, ROUTINE W REFLEX MICROSCOPIC
Bilirubin Urine: NEGATIVE
Ketones, ur: NEGATIVE mg/dL
Nitrite: NEGATIVE
Protein, ur: NEGATIVE mg/dL
pH: 5.5 (ref 5.0–8.0)

## 2011-01-28 LAB — URINE MICROSCOPIC-ADD ON

## 2011-01-28 LAB — PHOSPHORUS: Phosphorus: 4.1 mg/dL (ref 2.3–4.6)

## 2011-01-28 LAB — FERRITIN: Ferritin: 1021 ng/mL — ABNORMAL HIGH (ref 10–291)

## 2011-01-28 LAB — POCT HEMOGLOBIN-HEMACUE: Hemoglobin: 11 g/dL — ABNORMAL LOW (ref 12.0–15.0)

## 2011-01-28 LAB — SIROLIMUS LEVEL: Sirolimus (Rapamycin): 10.5 (ref 3.0–18.0)

## 2011-01-29 LAB — PROTIME-INR
INR: 1 (ref 0.00–1.49)
Prothrombin Time: 13.8 seconds (ref 11.6–15.2)

## 2011-01-29 LAB — COMPREHENSIVE METABOLIC PANEL
ALT: 11 U/L (ref 0–35)
AST: 13 U/L (ref 0–37)
BUN: 23 mg/dL (ref 6–23)
BUN: 23 mg/dL (ref 6–23)
CO2: 28 mEq/L (ref 19–32)
CO2: 30 mEq/L (ref 19–32)
CO2: 30 mEq/L (ref 19–32)
Calcium: 9.4 mg/dL (ref 8.4–10.5)
Calcium: 8.6 mg/dL (ref 8.4–10.5)
Chloride: 102 mEq/L (ref 96–112)
Chloride: 101 mEq/L (ref 96–112)
Chloride: 103 mEq/L (ref 96–112)
Creatinine, Ser: 2.15 mg/dL — ABNORMAL HIGH (ref 0.4–1.2)
Creatinine, Ser: 2.16 mg/dL — ABNORMAL HIGH (ref 0.4–1.2)
GFR calc Af Amer: 27 mL/min — ABNORMAL LOW (ref >60)
GFR calc Af Amer: 25 mL/min — ABNORMAL LOW (ref >60)
GFR calc non Af Amer: 23 mL/min — ABNORMAL LOW (ref >60)
GFR calc non Af Amer: 21 mL/min — ABNORMAL LOW (ref >60)
GFR calc non Af Amer: 23 mL/min — ABNORMAL LOW (ref >60)
Glucose, Bld: 119 mg/dL — ABNORMAL HIGH (ref 70–99)
Potassium: 3.8 mEq/L (ref 3.5–5.1)
Sodium: 142 mEq/L (ref 135–145)
Total Bilirubin: 0.8 mg/dL (ref 0.3–1.2)
Total Bilirubin: 0.7 mg/dL (ref 0.3–1.2)

## 2011-01-29 LAB — GLUCOSE, CAPILLARY
Glucose-Capillary: 125 mg/dL — ABNORMAL HIGH (ref 70–99)
Glucose-Capillary: 158 mg/dL — ABNORMAL HIGH (ref 70–99)
Glucose-Capillary: 195 mg/dL — ABNORMAL HIGH (ref 70–99)

## 2011-01-29 LAB — URINALYSIS, ROUTINE W REFLEX MICROSCOPIC
Glucose, UA: 100 mg/dL — AB
Nitrite: NEGATIVE
Protein, ur: 30 mg/dL — AB
Specific Gravity, Urine: 1.02 (ref 1.005–1.030)
Specific Gravity, Urine: 1.023 (ref 1.005–1.030)
Urobilinogen, UA: 0.2 mg/dL (ref 0.0–1.0)
pH: 7 (ref 5.0–8.0)

## 2011-01-29 LAB — DIFFERENTIAL
Basophils Absolute: 0 10*3/uL (ref 0.0–0.1)
Basophils Relative: 0 % (ref 0–1)
Lymphocytes Relative: 2 % — ABNORMAL LOW (ref 12–46)
Monocytes Relative: 6 % (ref 3–12)
Neutro Abs: 8.9 10*3/uL — ABNORMAL HIGH (ref 1.7–7.7)
Neutrophils Relative %: 92 % — ABNORMAL HIGH (ref 43–77)

## 2011-01-29 LAB — URINE CULTURE

## 2011-01-29 LAB — IRON AND TIBC
Saturation Ratios: 21 % (ref 20–55)
TIBC: 236 ug/dL — ABNORMAL LOW (ref 250–470)
UIBC: 187 ug/dL

## 2011-01-29 LAB — CBC
HCT: 29.5 % — ABNORMAL LOW (ref 36.0–46.0)
MCHC: 32.9 g/dL (ref 30.0–36.0)
MCV: 82.8 fL (ref 78.0–100.0)
Platelets: 199 10*3/uL (ref 150–400)
RBC: 3.56 MIL/uL — ABNORMAL LOW (ref 3.87–5.11)
RBC: 3.85 MIL/uL — ABNORMAL LOW (ref 3.87–5.11)
WBC: 6.2 10*3/uL (ref 4.0–10.5)
WBC: 9.7 10*3/uL (ref 4.0–10.5)

## 2011-01-29 LAB — POCT I-STAT, CHEM 8
BUN: 30 mg/dL — ABNORMAL HIGH (ref 6–23)
Creatinine, Ser: 2.6 mg/dL — ABNORMAL HIGH (ref 0.4–1.2)
Sodium: 139 mEq/L (ref 135–145)
TCO2: 31 mmol/L (ref 0–100)

## 2011-01-29 LAB — CULTURE, BLOOD (ROUTINE X 2): Culture: NO GROWTH

## 2011-01-29 LAB — PTH, INTACT AND CALCIUM: Calcium, Total (PTH): 9.8 mg/dL (ref 8.4–10.5)

## 2011-01-29 LAB — URINE MICROSCOPIC-ADD ON

## 2011-01-29 LAB — SIROLIMUS LEVEL: Sirolimus (Rapamycin): 8.6 (ref 3.0–18.0)

## 2011-01-29 LAB — BASIC METABOLIC PANEL
BUN: 18 mg/dL (ref 6–23)
Calcium: 8.9 mg/dL (ref 8.4–10.5)
GFR calc non Af Amer: 27 mL/min — ABNORMAL LOW (ref >60)
Potassium: 4 mEq/L (ref 3.5–5.1)
Sodium: 144 mEq/L (ref 135–145)

## 2011-01-29 LAB — POCT HEMOGLOBIN-HEMACUE: Hemoglobin: 10.8 g/dL — ABNORMAL LOW (ref 12.0–15.0)

## 2011-01-29 LAB — TYPE AND SCREEN: Antibody Screen: NEGATIVE

## 2011-01-29 LAB — TSH: TSH: 8.049 u[IU]/mL — ABNORMAL HIGH (ref 0.350–4.500)

## 2011-01-29 LAB — MAGNESIUM: Magnesium: 2.3 mg/dL (ref 1.5–2.5)

## 2011-01-30 LAB — COMPREHENSIVE METABOLIC PANEL
Albumin: 2.7 g/dL — ABNORMAL LOW (ref 3.5–5.2)
Alkaline Phosphatase: 60 U/L (ref 39–117)
BUN: 30 mg/dL — ABNORMAL HIGH (ref 6–23)
CO2: 27 mEq/L (ref 19–32)
Chloride: 105 mEq/L (ref 96–112)
GFR calc non Af Amer: 24 mL/min — ABNORMAL LOW (ref >60)
Glucose, Bld: 188 mg/dL — ABNORMAL HIGH (ref 70–99)
Potassium: 5.1 mEq/L (ref 3.5–5.1)
Total Bilirubin: 1.1 mg/dL (ref 0.3–1.2)

## 2011-01-30 LAB — BASIC METABOLIC PANEL
BUN: 12 mg/dL (ref 6–23)
CO2: 27 mEq/L (ref 19–32)
Chloride: 108 mEq/L (ref 96–112)
Chloride: 109 mEq/L (ref 96–112)
Chloride: 105 mEq/L (ref 96–112)
Creatinine, Ser: 1.43 mg/dL — ABNORMAL HIGH (ref 0.4–1.2)
Creatinine, Ser: 1.3 mg/dL — ABNORMAL HIGH (ref 0.4–1.2)
GFR calc Af Amer: 37 mL/min — ABNORMAL LOW (ref >60)
GFR calc Af Amer: 43 mL/min — ABNORMAL LOW (ref >60)
GFR calc Af Amer: 49 mL/min — ABNORMAL LOW (ref >60)
GFR calc non Af Amer: 31 mL/min — ABNORMAL LOW (ref >60)
GFR calc non Af Amer: 35 mL/min — ABNORMAL LOW (ref >60)
Glucose, Bld: 180 mg/dL — ABNORMAL HIGH (ref 70–99)
Glucose, Bld: 194 mg/dL — ABNORMAL HIGH (ref 70–99)
Potassium: 3.5 mEq/L (ref 3.5–5.1)
Potassium: 4.1 mEq/L (ref 3.5–5.1)
Potassium: 4.1 mEq/L (ref 3.5–5.1)
Potassium: 4 mEq/L (ref 3.5–5.1)
Sodium: 143 mEq/L (ref 135–145)
Sodium: 143 mEq/L (ref 135–145)
Sodium: 138 mEq/L (ref 135–145)

## 2011-01-30 LAB — GLUCOSE, CAPILLARY
Glucose-Capillary: 125 mg/dL — ABNORMAL HIGH (ref 70–99)
Glucose-Capillary: 167 mg/dL — ABNORMAL HIGH (ref 70–99)
Glucose-Capillary: 173 mg/dL — ABNORMAL HIGH (ref 70–99)
Glucose-Capillary: 183 mg/dL — ABNORMAL HIGH (ref 70–99)
Glucose-Capillary: 185 mg/dL — ABNORMAL HIGH (ref 70–99)
Glucose-Capillary: 197 mg/dL — ABNORMAL HIGH (ref 70–99)
Glucose-Capillary: 209 mg/dL — ABNORMAL HIGH (ref 70–99)
Glucose-Capillary: 212 mg/dL — ABNORMAL HIGH (ref 70–99)
Glucose-Capillary: 226 mg/dL — ABNORMAL HIGH (ref 70–99)
Glucose-Capillary: 237 mg/dL — ABNORMAL HIGH (ref 70–99)
Glucose-Capillary: 239 mg/dL — ABNORMAL HIGH (ref 70–99)
Glucose-Capillary: 239 mg/dL — ABNORMAL HIGH (ref 70–99)
Glucose-Capillary: 244 mg/dL — ABNORMAL HIGH (ref 70–99)
Glucose-Capillary: 259 mg/dL — ABNORMAL HIGH (ref 70–99)
Glucose-Capillary: 286 mg/dL — ABNORMAL HIGH (ref 70–99)
Glucose-Capillary: 292 mg/dL — ABNORMAL HIGH (ref 70–99)
Glucose-Capillary: 298 mg/dL — ABNORMAL HIGH (ref 70–99)
Glucose-Capillary: 303 mg/dL — ABNORMAL HIGH (ref 70–99)
Glucose-Capillary: 316 mg/dL — ABNORMAL HIGH (ref 70–99)
Glucose-Capillary: 358 mg/dL — ABNORMAL HIGH (ref 70–99)
Glucose-Capillary: 110 mg/dL — ABNORMAL HIGH (ref 70–99)
Glucose-Capillary: 137 mg/dL — ABNORMAL HIGH (ref 70–99)
Glucose-Capillary: 138 mg/dL — ABNORMAL HIGH (ref 70–99)
Glucose-Capillary: 193 mg/dL — ABNORMAL HIGH (ref 70–99)
Glucose-Capillary: 193 mg/dL — ABNORMAL HIGH (ref 70–99)
Glucose-Capillary: 211 mg/dL — ABNORMAL HIGH (ref 70–99)
Glucose-Capillary: 245 mg/dL — ABNORMAL HIGH (ref 70–99)
Glucose-Capillary: 36 mg/dL — CL (ref 70–99)
Glucose-Capillary: 47 mg/dL — ABNORMAL LOW (ref 70–99)
Glucose-Capillary: 60 mg/dL — ABNORMAL LOW (ref 70–99)
Glucose-Capillary: 86 mg/dL (ref 70–99)
Glucose-Capillary: 91 mg/dL (ref 70–99)

## 2011-01-30 LAB — CBC
HCT: 31.9 % — ABNORMAL LOW (ref 36.0–46.0)
HCT: 32.8 % — ABNORMAL LOW (ref 36.0–46.0)
HCT: 32.9 % — ABNORMAL LOW (ref 36.0–46.0)
HCT: 33.1 % — ABNORMAL LOW (ref 36.0–46.0)
HCT: 31.4 % — ABNORMAL LOW (ref 36.0–46.0)
Hemoglobin: 10.8 g/dL — ABNORMAL LOW (ref 12.0–15.0)
Hemoglobin: 10.9 g/dL — ABNORMAL LOW (ref 12.0–15.0)
Hemoglobin: 11.3 g/dL — ABNORMAL LOW (ref 12.0–15.0)
Hemoglobin: 10.5 g/dL — ABNORMAL LOW (ref 12.0–15.0)
MCHC: 32.8 g/dL (ref 30.0–36.0)
MCHC: 32.8 g/dL (ref 30.0–36.0)
MCHC: 33.4 g/dL (ref 30.0–36.0)
MCV: 84.3 fL (ref 78.0–100.0)
MCV: 85.8 fL (ref 78.0–100.0)
MCV: 85.9 fL (ref 78.0–100.0)
MCV: 86.3 fL (ref 78.0–100.0)
MCV: 84.7 fL (ref 78.0–100.0)
Platelets: 196 10*3/uL (ref 150–400)
Platelets: 228 10*3/uL (ref 150–400)
RBC: 3.81 MIL/uL — ABNORMAL LOW (ref 3.87–5.11)
RBC: 3.89 MIL/uL (ref 3.87–5.11)
RBC: 3.91 MIL/uL (ref 3.87–5.11)
RBC: 3.71 MIL/uL — ABNORMAL LOW (ref 3.87–5.11)
RDW: 17.3 % — ABNORMAL HIGH (ref 11.5–15.5)
RDW: 17.8 % — ABNORMAL HIGH (ref 11.5–15.5)
RDW: 17.9 % — ABNORMAL HIGH (ref 11.5–15.5)
RDW: 18 % — ABNORMAL HIGH (ref 11.5–15.5)
WBC: 10.9 10*3/uL — ABNORMAL HIGH (ref 4.0–10.5)
WBC: 7.2 10*3/uL (ref 4.0–10.5)
WBC: 9.4 10*3/uL (ref 4.0–10.5)
WBC: 6.5 10*3/uL (ref 4.0–10.5)

## 2011-01-30 LAB — DIFFERENTIAL
Basophils Absolute: 0 10*3/uL (ref 0.0–0.1)
Basophils Relative: 0 % (ref 0–1)
Eosinophils Absolute: 0.1 10*3/uL (ref 0.0–0.7)
Eosinophils Relative: 1 % (ref 0–5)
Lymphocytes Relative: 5 % — ABNORMAL LOW (ref 12–46)
Monocytes Absolute: 0.9 10*3/uL (ref 0.1–1.0)

## 2011-01-30 LAB — RENAL FUNCTION PANEL
Albumin: 2.6 g/dL — ABNORMAL LOW (ref 3.5–5.2)
BUN: 34 mg/dL — ABNORMAL HIGH (ref 6–23)
Calcium: 8.5 mg/dL (ref 8.4–10.5)
Chloride: 108 mEq/L (ref 96–112)
GFR calc Af Amer: 29 mL/min — ABNORMAL LOW (ref >60)
GFR calc Af Amer: 30 mL/min — ABNORMAL LOW (ref >60)
GFR calc non Af Amer: 24 mL/min — ABNORMAL LOW (ref >60)
GFR calc non Af Amer: 25 mL/min — ABNORMAL LOW (ref >60)
Phosphorus: 3 mg/dL (ref 2.3–4.6)
Potassium: 5.1 mEq/L (ref 3.5–5.1)
Sodium: 141 mEq/L (ref 135–145)

## 2011-01-30 LAB — HEPATIC FUNCTION PANEL
ALT: 16 U/L (ref 0–35)
AST: 16 U/L (ref 0–37)
Alkaline Phosphatase: 52 U/L (ref 39–117)
Bilirubin, Direct: 0.1 mg/dL (ref 0.0–0.3)
Indirect Bilirubin: 0.9 mg/dL (ref 0.3–0.9)

## 2011-01-30 LAB — VANCOMYCIN, TROUGH: Vancomycin Tr: 13.1 ug/mL (ref 10.0–20.0)

## 2011-01-30 LAB — PHOSPHORUS: Phosphorus: 3.6 mg/dL (ref 2.3–4.6)

## 2011-01-30 LAB — SEDIMENTATION RATE: Sed Rate: 80 mm/hr — ABNORMAL HIGH (ref 0–22)

## 2011-01-30 LAB — C-REACTIVE PROTEIN: CRP: 4.4 mg/dL — ABNORMAL HIGH (ref <0.6)

## 2011-01-31 LAB — COMPREHENSIVE METABOLIC PANEL
ALT: 19 U/L (ref 0–35)
ALT: 20 U/L (ref 0–35)
AST: 24 U/L (ref 0–37)
AST: 21 U/L (ref 0–37)
Albumin: 3.6 g/dL (ref 3.5–5.2)
Alkaline Phosphatase: 60 U/L (ref 39–117)
BUN: 23 mg/dL (ref 6–23)
CO2: 29 mEq/L (ref 19–32)
CO2: 30 mEq/L (ref 19–32)
CO2: 30 mEq/L (ref 19–32)
Calcium: 7.9 mg/dL — ABNORMAL LOW (ref 8.4–10.5)
Calcium: 8.7 mg/dL (ref 8.4–10.5)
Chloride: 104 mEq/L (ref 96–112)
Creatinine, Ser: 1.85 mg/dL — ABNORMAL HIGH (ref 0.4–1.2)
Creatinine, Ser: 1.91 mg/dL — ABNORMAL HIGH (ref 0.4–1.2)
GFR calc Af Amer: 33 mL/min — ABNORMAL LOW (ref >60)
GFR calc Af Amer: 33 mL/min — ABNORMAL LOW (ref >60)
GFR calc non Af Amer: 26 mL/min — ABNORMAL LOW (ref >60)
GFR calc non Af Amer: 27 mL/min — ABNORMAL LOW (ref >60)
GFR calc non Af Amer: 27 mL/min — ABNORMAL LOW (ref >60)
Glucose, Bld: 95 mg/dL (ref 70–99)
Potassium: 4 mEq/L (ref 3.5–5.1)
Sodium: 136 mEq/L (ref 135–145)
Total Bilirubin: 0.9 mg/dL (ref 0.3–1.2)
Total Protein: 5.8 g/dL — ABNORMAL LOW (ref 6.0–8.3)
Total Protein: 6.1 g/dL (ref 6.0–8.3)

## 2011-01-31 LAB — GLUCOSE, CAPILLARY
Glucose-Capillary: 105 mg/dL — ABNORMAL HIGH (ref 70–99)
Glucose-Capillary: 148 mg/dL — ABNORMAL HIGH (ref 70–99)
Glucose-Capillary: 169 mg/dL — ABNORMAL HIGH (ref 70–99)
Glucose-Capillary: 29 mg/dL — CL (ref 70–99)
Glucose-Capillary: 81 mg/dL (ref 70–99)

## 2011-01-31 LAB — DIFFERENTIAL
Eosinophils Relative: 0 % (ref 0–5)
Lymphocytes Relative: 2 % — ABNORMAL LOW (ref 12–46)
Lymphs Abs: 0.2 10*3/uL — ABNORMAL LOW (ref 0.7–4.0)
Monocytes Absolute: 0.4 10*3/uL (ref 0.1–1.0)
Monocytes Relative: 4 % (ref 3–12)

## 2011-01-31 LAB — BLOOD GAS, ARTERIAL
Drawn by: 232811
O2 Content: 2 L/min
O2 Content: 2 L/min
O2 Saturation: 92.6 %
O2 Saturation: 95.5 %
Patient temperature: 98.6
pCO2 arterial: 60.1 mmHg (ref 35.0–45.0)
pH, Arterial: 7.37 (ref 7.350–7.400)
pO2, Arterial: 95 mmHg (ref 80.0–100.0)

## 2011-01-31 LAB — HSV PCR
HSV 2 , PCR: NOT DETECTED
HSV, PCR: NOT DETECTED

## 2011-01-31 LAB — URINALYSIS, ROUTINE W REFLEX MICROSCOPIC
Bilirubin Urine: NEGATIVE
Bilirubin Urine: NEGATIVE
Bilirubin Urine: NEGATIVE
Glucose, UA: 100 mg/dL — AB
Ketones, ur: NEGATIVE mg/dL
Ketones, ur: NEGATIVE mg/dL
Nitrite: NEGATIVE
Nitrite: NEGATIVE
Protein, ur: NEGATIVE mg/dL
Protein, ur: NEGATIVE mg/dL
Specific Gravity, Urine: 1.029 (ref 1.005–1.030)
Urobilinogen, UA: 0.2 mg/dL (ref 0.0–1.0)
pH: 5.5 (ref 5.0–8.0)

## 2011-01-31 LAB — URINE CULTURE

## 2011-01-31 LAB — CSF CELL COUNT WITH DIFFERENTIAL
RBC Count, CSF: 3 /mm3 — ABNORMAL HIGH
Tube #: 1

## 2011-01-31 LAB — VIRUS CULTURE

## 2011-01-31 LAB — GRAM STAIN

## 2011-01-31 LAB — HEMOGLOBIN A1C
Hgb A1c MFr Bld: 8.1 % — ABNORMAL HIGH (ref 4.6–6.1)
Mean Plasma Glucose: 186 mg/dL

## 2011-01-31 LAB — CBC
Hemoglobin: 11.5 g/dL — ABNORMAL LOW (ref 12.0–15.0)
MCHC: 32.7 g/dL (ref 30.0–36.0)
MCHC: 32.7 g/dL (ref 30.0–36.0)
MCV: 85 fL (ref 78.0–100.0)
MCV: 85.4 fL (ref 78.0–100.0)
Platelets: 154 10*3/uL (ref 150–400)
RBC: 4.13 MIL/uL (ref 3.87–5.11)
RBC: 4.34 MIL/uL (ref 3.87–5.11)
RDW: 16.2 % — ABNORMAL HIGH (ref 11.5–15.5)
RDW: 17.8 % — ABNORMAL HIGH (ref 11.5–15.5)

## 2011-01-31 LAB — CSF CULTURE W GRAM STAIN: Culture: NO GROWTH

## 2011-01-31 LAB — SIROLIMUS LEVEL: Sirolimus (Rapamycin): 7.2 (ref 3.0–18.0)

## 2011-01-31 LAB — URINE MICROSCOPIC-ADD ON

## 2011-01-31 LAB — CULTURE, BLOOD (ROUTINE X 2)

## 2011-01-31 LAB — PHOSPHORUS: Phosphorus: 4.2 mg/dL (ref 2.3–4.6)

## 2011-01-31 LAB — H1N1 SCREEN (PCR): H1N1 Virus Scrn: NOT DETECTED

## 2011-01-31 LAB — PROTEIN AND GLUCOSE, CSF: Total  Protein, CSF: 81 mg/dL — ABNORMAL HIGH (ref 15–45)

## 2011-01-31 LAB — POCT HEMOGLOBIN-HEMACUE: Hemoglobin: 11.7 g/dL — ABNORMAL LOW (ref 12.0–15.0)

## 2011-02-04 LAB — URINALYSIS, ROUTINE W REFLEX MICROSCOPIC
Nitrite: NEGATIVE
Specific Gravity, Urine: 1.02 (ref 1.005–1.030)
pH: 6 (ref 5.0–8.0)

## 2011-02-04 LAB — URINE MICROSCOPIC-ADD ON

## 2011-02-04 LAB — COMPREHENSIVE METABOLIC PANEL
ALT: 25 U/L (ref 0–35)
BUN: 23 mg/dL (ref 6–23)
Calcium: 9 mg/dL (ref 8.4–10.5)
Glucose, Bld: 85 mg/dL (ref 70–99)
Sodium: 142 mEq/L (ref 135–145)
Total Protein: 6.5 g/dL (ref 6.0–8.3)

## 2011-02-04 LAB — SIROLIMUS LEVEL: Sirolimus (Rapamycin): 8 (ref 3.0–18.0)

## 2011-02-04 LAB — HEMOGLOBIN A1C
Hgb A1c MFr Bld: 8.1 % — ABNORMAL HIGH (ref 4.6–6.1)
Mean Plasma Glucose: 186 mg/dL

## 2011-02-04 LAB — IRON AND TIBC
Iron: 48 ug/dL (ref 42–135)
Saturation Ratios: 23 % (ref 20–55)
UIBC: 164 ug/dL

## 2011-02-04 LAB — LIPID PANEL
LDL Cholesterol: 101 mg/dL — ABNORMAL HIGH (ref 0–99)
Triglycerides: 137 mg/dL (ref <150)

## 2011-02-04 LAB — POCT HEMOGLOBIN-HEMACUE: Hemoglobin: 12.6 g/dL (ref 12.0–15.0)

## 2011-02-05 LAB — URINE MICROSCOPIC-ADD ON

## 2011-02-05 LAB — COMPREHENSIVE METABOLIC PANEL
Albumin: 3.3 g/dL — ABNORMAL LOW (ref 3.5–5.2)
Alkaline Phosphatase: 45 U/L (ref 39–117)
BUN: 23 mg/dL (ref 6–23)
Chloride: 101 mEq/L (ref 96–112)
Glucose, Bld: 125 mg/dL — ABNORMAL HIGH (ref 70–99)
Potassium: 3.7 mEq/L (ref 3.5–5.1)
Total Bilirubin: 0.9 mg/dL (ref 0.3–1.2)

## 2011-02-05 LAB — URINALYSIS, ROUTINE W REFLEX MICROSCOPIC
Glucose, UA: NEGATIVE mg/dL
Specific Gravity, Urine: 1.021 (ref 1.005–1.030)
pH: 6 (ref 5.0–8.0)

## 2011-02-05 LAB — URINE CULTURE

## 2011-02-05 LAB — SIROLIMUS LEVEL: Sirolimus (Rapamycin): 9.8 (ref 3.0–18.0)

## 2011-02-06 ENCOUNTER — Other Ambulatory Visit: Payer: Self-pay | Admitting: Nephrology

## 2011-02-06 ENCOUNTER — Encounter (HOSPITAL_COMMUNITY): Payer: Medicare Other | Attending: Nephrology

## 2011-02-06 DIAGNOSIS — E119 Type 2 diabetes mellitus without complications: Secondary | ICD-10-CM | POA: Insufficient documentation

## 2011-02-06 DIAGNOSIS — N183 Chronic kidney disease, stage 3 unspecified: Secondary | ICD-10-CM | POA: Insufficient documentation

## 2011-02-06 DIAGNOSIS — N2581 Secondary hyperparathyroidism of renal origin: Secondary | ICD-10-CM | POA: Insufficient documentation

## 2011-02-06 DIAGNOSIS — D638 Anemia in other chronic diseases classified elsewhere: Secondary | ICD-10-CM | POA: Insufficient documentation

## 2011-02-06 LAB — URINALYSIS, ROUTINE W REFLEX MICROSCOPIC
Nitrite: NEGATIVE
Protein, ur: 30 mg/dL — AB
Urobilinogen, UA: 0.2 mg/dL (ref 0.0–1.0)

## 2011-02-06 LAB — COMPREHENSIVE METABOLIC PANEL
Albumin: 3.4 g/dL — ABNORMAL LOW (ref 3.5–5.2)
BUN: 23 mg/dL (ref 6–23)
Chloride: 102 mEq/L (ref 96–112)
Creatinine, Ser: 2.02 mg/dL — ABNORMAL HIGH (ref 0.4–1.2)
GFR calc non Af Amer: 24 mL/min — ABNORMAL LOW (ref >60)
Total Bilirubin: 0.7 mg/dL (ref 0.3–1.2)

## 2011-02-06 LAB — POCT HEMOGLOBIN-HEMACUE: Hemoglobin: 11.7 g/dL — ABNORMAL LOW (ref 12.0–15.0)

## 2011-02-06 LAB — IRON AND TIBC
Saturation Ratios: 21 % (ref 20–55)
UIBC: 184 ug/dL

## 2011-02-06 LAB — URINE MICROSCOPIC-ADD ON

## 2011-02-07 LAB — URINE CULTURE: Culture  Setup Time: 201204180959

## 2011-03-05 NOTE — Discharge Summary (Signed)
[name], [name]                  ACCOUNT NO.:  [id number]   MEDICAL RECORD NO.:  [id number]          PATIENT TYPE:  INP   LOCATION:  6704                         FACILITY:  [facility]   PHYSICIAN:  Tera Mater. Evlyn Kanner, M.D. DATE OF BIRTH:  November 23, 1940   DATE OF ADMISSION:  01/17/2009  DATE OF DISCHARGE:  01/27/2009                               DISCHARGE SUMMARY   DISCHARGE DIAGNOSES:  1. Febrile illness, likely due to urinary tract infection.  2. Back pain with no evidence of epidural or spinal infection.  3. Type 2 diabetes with fair control.  4. Renal transplant with stable renal function.  5. Chronic anemia, stable.  6. Hypertension with fair control.   CONSULTATIONS:  Nephrology, who saw her throughout much of her  hospitalization.   PROCEDURES:  An MRI of the spine.   [name] is a 70 year old black female, longstanding patient of our  practice, who presented to my partner, Dr. Timothy Lasso with fever on January 17, 2009.  This was presumed urosepsis due to prior history of recurrent  problems.  The patient did fair and was treated with broad-spectrum  antibiotics with improvement.  She had marginal oxygenation status and  clear evidence of some sepsis early on.  Fortunately, a renal function,  although it did bump up, came back down.  She was seen in consultation  also by Infectious Disease, was very helpful in her management.  The  patient was treated with broad-spectrum antibiotics.  White count  continued to be up and the patient underwent a lumbar puncture, results  of which will be detailed below.  Her back pain became much more  prominent as a problem and we were worried that we might be seeing an  epidural abscess.  There was some confusion, but the patient did undergo  this procedure and fortunately did not have any infection.  Urine  eventually grew enterococcus, and the patient was treated with full  course of vancomycin with a bit of outpatient management necessary.  The  patient had some hypoglycemia late in her hospitalizations and  medications were adjusted.  Overall, the patient did clinically well  with a sepsis being fairly brief and renal function returning to normal.   LABORATORY DATA:  A March 31 chest x-ray showed mild pulmonary vascular  congestion.  A CT of the head showed no acute or focal abnormality that  was on March 31.  IJ catheter was placed, and it was noted that lumbar  puncture was done under Radiology guidance at L3-4 on January 18, 2009.  MRI of lumbar spine showed endplate enhancement at 4-5 without  significant fluid in disk space, likely diskogenic phase, no definite  evidence for infection, postoperative change at L5-S1, left greater than  right lateral recess and foraminal narrowing at L5-S1, moderate severe  central canal stenosis due to broad-based disk and facet disease at L4-  5, severe right and mild left foraminal narrowing at L4-5 and mild disk  bulging and facet hypertrophy at L2-3 and L3-4 without significant  stenosis.  White count at presentation was 10,100, hemoglobin  12.1, and  platelets 154,000.  Final value white count 6500, hemoglobin 10.5, and  platelets 148,000.  White count did rise as high as 23,100.  Her  phosphorus at this time was 3.5.  Her initial chemistry; sodium 141,  potassium 3.1, chloride 103, CO2 of 30, BUN 25, creatinine 1.85, and  glucose 94.  Her peak creatinine was 2.04, came back to 1.43 at  discharge.  CSF protein was 81.  CSF glucose was 76.  VDRL, the CSF was  negative.  Urinalysis showed trace hemoglobin at one time and glucose  couple times 7-10 white cells at presentation.  Blood culture on March  30 was no growth.  CSF Gram stain was no organisms seen.  Urine culture  was 20,000 colonies of enterococcus, possibly contaminant, it  was  sensitive to vancomycin.  Viral culture of the CSF was negative.  H1N1  screen was negative. HSV1 and 2 and the CSF were both negative.   In summary, we  have a complicated presentation of an immunosuppressed  patient with diabetes and renal transplant, presenting with a febrile  illness.  Extensive workup and broad-spectrum antibiotics were employed  and a variety of diagnoses were entertained.  Final working diagnosis  was sepsis from urinary source with a negative CSF workup and no  evidence of epidural or spinal abscess.  The patient does have  significant spinal stenosis causing pain, which is somewhat of a  distracting type issue, but not related to infectious problems.  The  patient fortunately did well during her hospitalization and was  discharged to home in improved condition.   MEDICATIONS AT DISCHARGE:  1. Vancomycin 750 daily for 4 days.  2. She was on labetalol 200 three times a day.  3. Prilosec 20 once daily.  4. Lasix 40 twice daily.  5. Rapamune 4 mg once daily.  6. Prednisone 10 mg once daily.  7. Synthroid 175 mcg daily.  8. Lipitor 40 mg daily.  9. Magnesium oxide 400 daily.  10.Allopurinol 150 daily.  11.CellCept 500 twice daily.  12.Potassium 20 mEq twice daily.  13.Calcitriol 0.5 daily.  14.New therapy of amlodipine 5 mg each a.m. was noted.  15.Vitamin D 50,000 units weekly.  16.Gabapentin 100 three times a day.  17.Lantus 32 units in the morning.  18.Duragesic patch 62 mcg every 72 hours.  19.She takes Bactrim DS Monday, Wednesday, and Friday.  20.Macrobid 100 at bedtime.   She is to follow up with me in a week to 10 days.  She will see renal  doctors as previously appointed.  She is to call if she had recurrent  fever.  Home Health was set up to do the injections.  No PICC line was  employed.     ______________________________  Tera Mater Evlyn Kanner, M.D.    ______________________________  Tera Mater. Evlyn Kanner, M.D.    SAS/MEDQ  D:  03/29/2009  T:  03/29/2009  Job:  [id number]

## 2011-03-05 NOTE — H&P (Signed)
NAMESAHORY, NORDLING NO.:  [id number]   MEDICAL RECORD NO.:  [id number]          PATIENT TYPE:  INP   LOCATION:  0102                         FACILITY:  [facility]   PHYSICIAN:  Gwen Pounds, MD       DATE OF BIRTH:  [birth year]   DATE OF ADMISSION:  01/17/2009  DATE OF DISCHARGE:                              HISTORY & PHYSICAL   PRIMARY CARE [facility]enzie Bove:  Tera Mater. Evlyn Kanner, M.D.   PRIMARY NEPHROLOGIST:  Duke Salvia. Eliott Nine, M.D.   TRANSPLANT PHYSICIAN:  Dr. Pecola Leisure over at [facility]   CHIEF COMPLAINT:  High fever, chills, rigors and out of it.   HISTORY OF PRESENT ILLNESS:  This is a 70 year old female with a history  of renal transplant on immunosuppressive agents and long-standing  diabetes.  Her baseline creatinine is 1.87.  She came to the hospital  this morning for her 1 time per month blood work.  Labs were stable.  Urinalysis was positive for urinary tract infection.  The family has not  yet been called by Dr. Elza Rafter office with a positive UA.  The husband  says that at the lab visit for blood work he was surprised at how  concentrated and how dirty her urine looked.  Today, around 3:30 or 4:00  the husband was called in from outside after he finished up mowing the  yard, and after he got in, [name] started complaining of being very  cold,  having chills, presumed fever, trembling, and rigors.  They put  warm blankets on her.  She laid down and fell asleep for a while.  She  woke up and was not making too much sense, was kind of a little bit out  of it.  The husband called the EMS around 8:30 or 9:00 and she was  brought to the emergency department.  In the emergency department,  temperature was 103.  Looking over the labs it showed a urinary tract  infection this morning but now mildly dirty without anything major.  Chest x-ray was of poor quality, and while I am getting an interview  from the husband, the patient was being taken  down for a PA and lateral  x-ray.  Mentally better, according to the husband.  In the ED, the ED  physician gave the patient Avelox 400, K-Dur, vancomycin 1 g, oxygen,  cefepime 1 g, Tamiflu, and I was called for inpatient admission.  She  was brought back to the room after the x-ray and she was alert, started  answering some questions, then fell asleep and started snoring and was  hard to wake up.  I got the tech to come in and check her blood sugars.  It was between 25-29.  I  asked the nurse to run and get D50.  She has  gotten 50 mL of D50 stat.  Now I am just waiting for improvement.   PAST MEDICAL HISTORY:  1. Status post renal transplant, cadaveric, in 2007.  2. History of urosepsis and urinary tract infections.  3. Diabetes mellitus.  4. GERD.  5. Peptic ulcer disease.  6. Primary hypothyroidism.  7. Secondary hyperparathyroidism status post partial      parathyroidectomy.  8. Anemia.  9. Gout.  10.Hyperlipidemia.  11.History of left upper extremity AV fistula.  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  13.Hypertension.  14.Gastroparesis.  15.Neuropathy.  16.Retinopathy.  17.Total abdominal hysterectomy.  18.Cervical decompression.  19.Chronic low back pain.   ALLERGIES:  1. IVP DYE.  2. IODINE.  3. LATEX.  4. PENICILLIN.  5. TETRACYCLINE.  6. VICODIN.  7. TYLENOL.   MEDICATIONS:  1. CellCept 500 b.i.d.  2. Allopurinol 150 daily.  3. Calcitriol 0.5 daily.  4. Lasix 40 b.i.d.  5. Neurontin 100 t.i.d.  6. KCl 20 mEq p.o. b.i.d.  7. Labetalol 300 t.i.d.  8. Lipitor 40 daily.  9. Mag Oxide 400 daily.  10.Prednisone 10 daily.  11.Rapamune 7 mg p.o. daily.  12.Synthroid 175 daily.  13.Vitamin D 50,000 units weekly.  14.Prevacid 15 daily.  15.Lantus 32 units in the morning with sliding scale Humalog.  16.Procrit ?Marland Kitchen   FAMILY HISTORY:  Diabetes and amputations.   SOCIAL HISTORY:  Married.  No tobacco, no alcohol.    REVIEW OF SYSTEMS:  She chronically walks with a walker.  She has  chronic back pain and leg pain, neuropathic pain.  The ER physician said  she has had a dry cough of late.  The husband says very mild and it has  not been a big deal.  She has a chronic sore throat since being  intubated about a year or 2 ago.  She has no nausea and no vomiting, no  diarrhea.  There has been increasing fatigue.  All of the rest of her  organ systems were discussed with the husband and the patient, and no  obvious changes or positives.   PHYSICAL EXAMINATION:  Temperature 103.4, 102.7, blood pressure 118/56  up to 135/47, heart rate 93 to 105, saturation 96 to 99%.  GENERAL:  Awake but falls asleep easily, snoring.  SKIN:  Warm.  PULMONARY:  Mild rhonchi.  Oropharynx is clear.  EYES:  Clear.  Foley in place.  CARDIAC:  Regular, tachycardic, heart rate 100 to 105.  ABDOMEN:  Soft.  Left upper extremity shows an old AV fistula scar with a very weak  thrill.  EXTREMITIES:  No edema.   Chest x-ray is portable, poor film, low lung volumes.  PA and lateral  chest x-ray:  I was not that impressed that it was that much different  than the portable, it did show cardiomegaly though.  White count 10.1  with 93% segs, hemoglobin 12.1, platelet count 154.  Sodium 141,  potassium 3.1, chloride 103, bicarb 30, BUN 25, creatinine 1.85, glucose  94.  LFTs within normal limits.  Urinalysis:  Small leukocytes, no  nitrites, 7-10 white blood cells per high-power field and rare bacteria.  This is in contrast to this morning which showed multiple white blood  cells per high-power field and much more bacteria.  Ferritin this  morning was 980, the rest of her labs were unremarkable.   ASSESSMENT:  This is a 70 year old lady on immunosuppressive agents for  history of kidney transplant being admitted for fevers, chills, rigors,  and a positive urinalysis this morning.  This is very compatible with  urosepsis.  My  underlying question is why the 10 p.m. urine looks mildly  dirty and the one this morning looked  very dirty.  Repeat chest x-ray  did not improve our diagnostic ability.  She has only had 1 blood  culture because the second could not be done.  A  urine culture is  pending.  I  have ordered nasal swabs for flu and H1N1.   PLAN:  1. Will admit to step-down.  2. Continue the cefepime at this current time.  3. CBG now is 29, she is status post bolus of D50.  4. Consult renal and ID in the morning.  5. If she gets sicker, she may need pulmonary critical care consult.      She may also need to be transferred to [facility] if it is      felt she is having any underlying rejection.  6. At this point she does not appear to be rejecting the kidney,      though.  7. Give stress dose steroids, IV Solu-Medrol.  8. Repeat urinalysis C and S because the first just did not make any      sense.  9. Check for influenza A and B and H1N1.  10.Insulin sliding scale has been ordered.  I suspect after the IV      Solu-Medrol and the D50, her blood sugars are going to go very      high.  She may also need D5 saline.  Will check in a little while      with the nurse and see where we are at.  11.Home medications.  12.Watch for sepsis.  She is apparently there already based on her      overall symptoms.  13.O2 for comfort.  14.Cannot give TYLENOL or NSAIDs due to allergies and her kidney      issues.  15.Will replete the K, but watch for overdoing it.  16.Her underlying creatinine is stable.  17.Watch her white blood cell count.  18.Continue to search out a source until we are very comfortable that      this is just plain old urosepsis.      Gwen Pounds, MD  Electronically Signed     JMR/MEDQ  D:  01/18/2009  T:  01/18/2009  Job:  [id number]   cc:   Dr. Jadene Pierini [facility]   Jeannett Senior A. Evlyn Kanner, M.D.  Fax: [phone]   [facility] Salvia Eliott Nine, M.D.  Fax: [phone]

## 2011-03-05 NOTE — H&P (Signed)
NAMETENA, LINEBAUGH                  ACCOUNT NO.:  [id number]   MEDICAL RECORD NO.:  [id number]          PATIENT TYPE:  INP   LOCATION:  6732                         FACILITY:  [facility]   PHYSICIAN:  Tera Mater. Evlyn Kanner, M.D. DATE OF BIRTH:  [birth year]   DATE OF ADMISSION:  05/10/2009  DATE OF DISCHARGE:                              HISTORY & PHYSICAL   [name] is a 70 year old black female with a history of cadaveric renal  transplant, and type 2 diabetes, and recurrent pyelonephritis who  presents for evaluation.  She started feeling bad 24-48 hours ago with  general malaise.  Today, she presented with severe back pain, nausea,  and vomiting, and anorexia.  She had no distinct urinary symptoms, but  this is not uncommon for her.  She had low blood sugars today rather  than high.  She has had some chills and sweats.  Her anterior abdomen  has not been hurting much, with exception of more flank pain.  Recently,  she has been feeling exceptionally well and had been doing well.  Labs  have been done just about 2 weeks ago with a normal sirolimus level of  10.5 and a urine culture done on the 28th, just showed mixed colonies.  Today evaluation in the emergency room has suggested, she has recurrent  pyelonephritis, and she is being admitted for this reason.  Fortunately,  she does not appear to have developed full sepsis or Siris syndrome.  She is actually feeling a little bit better.  She is hungry now and has  not really had much to eat most of the day.  She denies any cardiac  chest pain.  Her breathing is doing well.  Her chronic back pain at its  baseline.  She has had no swelling of her ankles.  She has been taking  her medications as noted.  She has actually dropped her insulin down a  bit and sugars have been low in recent days.   PAST MEDICAL HISTORY:  1. Type 2 diabetes.  She has complications of this.  She has 25 years      type 2 diabetes.  2. She has retinopathy with prior eye  surgery.  3. Nephropathy obviously with a renal transplant.  4. Gastroparesis.  5. Peripheral neuropathy.  6. She has had cadaveric renal transplant in 2007.  7. She has had gastroesophageal reflux disease with peptic ulcer      disease.  8. She has had primary hypothyroidism, and secondary      hyperparathyroidism, and partial thyroidectomy in the past.  9. She has anemia of chronic disease due to renal disease and has had      erythropoietin given in the past.  10.She has had gout, but no recent flare.  11.She has hyperlipidemia.  12.She has had a fistula in the left upper arm with an aneurysmal tear      around this.  13.She has had hypertension.  14.She had encephalopathy due to a low blood sugar with a recent      hospitalization last year.   SURGICAL HISTORY:  1. Renal transplant.  2. Hysterectomy.  3. Cervical decompression.  4. Fistula placement.   ALLERGIES:  She has allergies to IVP dye, she has allergies to IODINE,  allergies to LATEX, allergies to PENICILLIN, allergies to TETRACYCLINE,  allergy to VICODIN, and possible allergy to TYLENOL.   She has tolerated cefepime during the last hospitalization.  She has  tolerated other hydrocodone containing pain medicines in the past.   CURRENT MEDICATION LIST:  1. Lantus 30 in the morning with a sliding scale Humalog.  2. Labetalol 300 three times a day.  3. Prilosec 20 daily.  4. Lasix 80 daily.  5. Rapamune 4 mg daily.  6. Prednisone 5 daily.  7. Lipitor 40 daily.  8. Magnesium oxide 400 daily.  9. CellCept 250 twice daily.  10.Bactrim on Monday, Wednesday, and Friday.  11.Calcitriol 0.5 mg daily.  12.Neurontin 100 mg in the morning, 200 in the evening.  13.Duragesic, she takes both of 50 and 12.5 mcg patch and applies      every 3 days.  14.She is on Synthroid 125 mcg daily.   PHYSICAL EXAMINATION:  GENERAL:  Here in the emergency room, we have an  unwell-appearing, but nontoxic-appearing black female sitting  up in no  distress.  Bilateral periorbital edema is present.  VITAL SIGNS:  Blood pressure is 183/74, pulse is 80, respirations 20,  temperature 97.8, O2 sat is 99%.  HEENT:  Have bilateral periorbital edema, especially the upper lids.  Sclerae anicteric.  Extraocular movements are intact with conjugate  gaze.  Oropharynx is clear.  Nasal passages are clear.  NECK:  Supple.  I hear no bruits.  There is a well-healed scar.  No JVD  is present.  LUNGS:  Clear without wheezes, rales, or rhonchi.  No adventitious  sounds are heard.  HEART:  Regular with systolic murmur in the mitral area.  ABDOMEN:  Well-healed scars.  There is little bulge in the left lower  area.  There is no masses that I can palpate.  No hepatosplenomegaly is  present.  Bowel sounds are hypoactive, but present.  No rebound is  present.  EXTREMITIES:  Strong distal pulses without edema.  No foot lesions are  present.  NEUROLOGIC:  The patient is awake, alert.  Her mentation is good.  Her  speech is clear.  No resting tremors present.  Orientation is fine.   LABORATORY DATA:  A CT of the abdomen and pelvis.  They see  cardiomegaly, mild mitral annular calcification, coronary  atherosclerosis, small pericardial effusion, native kidneys are atrophic  with a cyst in the left upper renal pole.  Collecting system  calcifications present bilaterally.  There is bilateral renal artery  stenosis.  Right iliac fossa renal allograft is present.  Adrenal glands  are fine.  The abdominal hollow viscera appear within normal limits.  There is stranding of subcutaneous tissues consistent with anasarca  changes.  Cholecystectomy is noted.  There is lumbar spondylosis and  scoliosis most severe at L5-S1 with bone-on-bone destructive endplate  changes.  The fluid collection is in the incisional site in the right  lower quadrant.  There is iliofemoral atherosclerosis, probably urethral  diverticulum, colonic diverticulosis, and the seroma  is noted that has  actually shrunk in size from where it had been.  A chest x-ray was not  performed.  Lipase level was 14.  Chemistries revealed a sodium 141,  potassium 3.8, chloride 106, CO2 of 30, BUN 18, creatinine 1.52, glucose  of 57.  Estimated GFR is 41 mL per minute.  Her alkaline phosphatase is  65, AST is 24, ALT is 28, total protein is 6.8, albumin is 3.6, calcium  is 8.7.  White count is 4,400, hemoglobin is 11.6, hematocrit 35.5, MCV  81.3, platelets 159,000.  There are 7-10 white cells, 0-2 red cells in  her urine with a specific gravity 1.020, pH of 6.0, a 30 mg percent  protein and trace leukocytes.  A sirolimus level from the 28th was 10.5  ng/mL.  Ferritin from the 28th was 1021 ng/mL.  Phosphorus was 4.1 on  the 28th as well.   SUMMARY:  We have a 70 year old immunocompromise black female presenting  with pyelonephritis in the setting of renal transplant, type 2 diabetes,  on immunosuppressive agents.  The patient is actually a bit hypertensive  with no evidence of sepsis or Siris.  She is ill, however, with needs  for pain control and systemic therapy.  She had previously tolerated her  cefepime perfectly fine during her last hospitalization, and I am going  to go with that again since she had responded to that.  Her blood  pressures on the high side and she really had not kept her medicines  down today.  We are going to get those back going.  We will continue  here routine immunosuppressive agents.  I do not think we need to boost  her prednisone above 5 mg with blood pressure up.  We will continue  Lipitor at the present.  Her suppressive doses of Bactrim will be  continued for opportunistic infections.  Her Neurontin and Duragesic  will be continued as will be her thyroid treatment.  Her last TSH was  elevated at 8.  We are going to see if this is getting better.  She will  be given IV morphine sulfate and Zofran p.r.n.  I have let the renal  service now as a  courtesy that she is in.  She is being admitted of  course to my service and I  will be managing her primarily.  Any help  they are giving is greatly appreciated obviously.  All this plan was  discussed with the patient and her husband.  Her current code status is  full code.  She will be gently hydrated as well.  The only other issue  is that does look like she has significant coronary calcifications.  I  will need to look back her records and see if I have done prior cardiac  workup on her.  Clearly, she is at high risk for this.           ______________________________  Tera Mater. Evlyn Kanner, M.D.     SAS/MEDQ  D:  05/10/2009  T:  05/11/2009  Job:  [id number]

## 2011-03-05 NOTE — H&P (Signed)
NAMESOLARIS, Russo                  ACCOUNT NO.:  [id number]   MEDICAL RECORD NO.:  [id number]          PATIENT TYPE:  INP   LOCATION:  2109                         FACILITY:  [facility]   PHYSICIAN:  Kari Baars, M.D.  DATE OF BIRTH:  [birth year]   DATE OF ADMISSION:  03/31/2007  DATE OF DISCHARGE:                              HISTORY & PHYSICAL   CHIEF COMPLAINT:  Unresponsive.   HISTORY OF PRESENT ILLNESS:  [name] is a 70 year old African-American  female with type 2 diabetes with end-stage renal disease status post  renal transplant (2007) on immunosuppressive therapy who presented to  the emergency department today with an episode of being unresponsive.  The patient was recently hospitalized at [facility] for altered mental  status and confusion.  At that time, she underwent evaluation including  carotid studies, echocardiogram, and probable CT or MRI of the brain  which were unremarkable.  She was discharged on antibiotics for a  urinary tract infection and returned to her usual state of health.  Her  husband and daughter state that she was doing fine until this morning  when she stated that she was tired.  She laid down and took a nap.  When  the daughter went to awaken her from her nap, she was unresponsive and  she was unable to awaken her.  Her CBG at the time was 35.  EMS was  called and she was given glucagon without improvement in her mental  status.  She was brought to the emergency department where she received  Ativan, Solu-Medrol, Benadryl, Pepcid, and epinephrine.  Due to labored  breathing and concern for airway protection, she was intubated and  placed on sedation.  Currently, she is very agitated on the ventilator.  History is obtained from her husband and daughter.   REVIEW OF SYSTEMS:  Unable to obtain due to intubation.  I discussed  with her husband who states that she has not had any recent issues other  than those above.   PAST MEDICAL HISTORY:  1. Type 2  diabetes with nephropathy, neuropathy, and retinopathy.      Diagnosed 1976, insulin dependent.  2. 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  Status post renal      transplant (2007).  3. Gastroparesis.  4. Hypertension.  5. Hyperlipidemia.  6. Hypothyroidism.  7. Gout.  8. Peptic ulcer disease.  9. Status post parathyroidectomy.  10.Status post total abdominal hysterectomy.  11.Status post cervical decompression.   CURRENT MEDICATIONS:  1. Allopurinol 300 mg one-half daily.  2. Calcitriol 0.5 mcg daily.  3. CellCept 250 mg three b.i.d.  4. Cultrazine 0.6 mg p.r.n.  5. Diazepam.  6. Lasix 40 mg daily.  7. Neurontin 100 mg every a.m., 200 mg q.h.s.  8. KCl 20 meq daily.  9. Labetalol 200 mg t.i.d.  10.Lipitor 40 mg daily.  11.Magnesium oxide.  12.Prednisone 5 mg daily.  13.Prilosec OTC.  14.Rapamune 1 mg seven daily.  15.Seroquel 50 mg q.h.s.  16.Synthroid 175 mcg daily.  17.Lantus 35 units daily.  18.Humalog sliding scale insulin.   PHYSICAL  EXAMINATION:  VITAL SIGNS:  Temperature 97.5 rectally, blood  pressure 200/87, pulse 89, respirations 16, oxygen saturation 100% on  room air.  GENERAL:  She is agitated, moving all extremities.  HEENT:  Pupils are pin-point and reactive bilaterally.  She is orally  intubated.  Her tongue is protruding.  No scleral icterus.  NECK:  Supple without lymphadenopathy, JVD, or carotid bruits.  HEART:  Regular rate and rhythm without murmurs, rubs, or gallops.  LUNGS:  Clear to auscultation bilaterally anteriorly.  ABDOMEN:  Soft, nondistended, nontender with normoactive bowel sounds.  EXTREMITIES:  No clubbing, cyanosis, or edema.  NEUROLOGICAL:  Agitated, but moving all extremities.  She is grimacing  and withdrawing from noxious stimulus.  No clonus.   LABORATORY DATA:  CBC shows a white count of 7.7, hemoglobin 12.1,  platelets 208,000.  BMET significant for sodium of 143, potassium 3.9,  chloride 106, Bicarb 33, BUN 35,  creatinine 2.1, glucose 74.  ABG  performed (question prior to intubation) pH 7.44, pCO2 54, pO2 518.   STUDIES:  1. EKG, personally reviewed, shows normal sinus rhythm with      inferolateral T wave inversions consistent with LVH with      repolarization abnormalities.  There has been a slight increase in      ST depression compared to October 2005.  2. Chest x-ray shows cardiomegaly with bilateral interstitial      opacities/edema.  3. Head CT limited by motion, but no definite intracranial      abnormalities.  There is a possible right skull-based mass.      Recommend CT when stable.   ASSESSMENT/PLAN:  1. Unresponsive episode secondary to hypoglycemia - it appears that      her acute episode was due to hypoglycemia, likely due to her      insulin therapy.  She is now more agitated and awake then she was      when she came in.  Will admit to the ICU for ventilatory management      and close monitoring.  Will place her on an ICU hyperglycemia      protocol and D5 half normal saline IV fluids to combat an      additional hypoglycemia.  Pulmonary has been consulted for      ventilator management, although it may be okay to extubate her soon      given her increased level of consciousness.  If her altered mental      status fails to improve, we will pursue additional neurologic      evaluation with an MRI of the brain and possible EEG.  2. Hypertension.  She has markedly elevated blood pressure due to      agitation which should improve with sedation.  I have placed her on      a labetalol drip to assist with this until her blood pressure      improves.  Will resume her home medications when she is tolerating      p.o.  3. End-stage renal disease.  Status post renal transplant - slight      increase in creatinine from 1.7 to 2.1.  Nephrology has been      consulted to assist with her management and immunosuppressant      therapies.  Will place her on stress-dosed steroids and taper  as      tolerated.  4. Possible pulmonary edema on chest x-ray - will check a BNP.  This  is likely due to her markedly elevated blood pressure.  Will give      Lasix 40 mg IV times 1.  Consider echocardiogram.  Monitor renal      function closely with diuresis.  5. Possible skull-based mass - question significance versus motion      artifact.  Consider CT versus MRI for further evaluation.  6. GI prophylaxis with Protonix.  7. DVT prophylaxis with Lovenox.  8. FEN - will keep her n.p.o. until her respiratory issues are      clarified.  If she has prolonged need for n.p.o. status, we will      start tube feeds.      Kari Baars, M.D.  Electronically Signed     WS/MEDQ  D:  03/31/2007  T:  04/01/2007  Job:  [id number]   cc:   [facility] Salvia. Eliott Nine, M.D.

## 2011-03-05 NOTE — Op Note (Signed)
[name], [name]                  ACCOUNT NO.:  [id number]   MEDICAL RECORD NO.:  [id number]          PATIENT TYPE:  AMB   LOCATION:  SDS                          FACILITY:  [facility]   PHYSICIAN:  Larina Earthly, M.D.    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   PROCEDURE:  Resection of large venous aneurysms of left upper arm  arteriovenous fistula.   SURGEON:  Larina Earthly, MD   ASSISTANT:  Nurse.   ANESTHESIA:  General endotracheal.   COMPLICATIONS:  None.   DISPOSITION:  To recovery room in stable.   INDICATIONS FOR PROCEDURE:  The patient is a 70 year old white female  with a prior history of end-stage renal disease with a long-standing  left upper arm AV fistula.  She has had a successful renal transplant  and is no longer on hemodialysis.  She has had progressive aneurysm with  degeneration of her entire left upper arm AV fistula, and this is very  cumbersome to her and is causing discomfort.  I explained that it did  appear to still be adequate for hemodialysis access and that if we  ligated and excised this, she would burn a bridge for eventual  hemodialysis if this was required and if she had a failure of her  transplant.  She understand this and wishes to proceed with resection of  this large venous aneurysm.   PROCEDURE IN DETAIL:  The patient was taken to the operating room and  placed in supine position, and the area of the left arm was prepped in  usual sterile fashion.  Using local anesthesia, an incision was made  over the antecubital space and carried down to isolate the venous-to-  arterial anastomosis.  The brachial artery was controlled proximal and  distal to the old  venous anastomosis.  The vein was occluded, and the  artery was occluded proximally and distally.  The vein was resected at  the level of the brachial artery anastomosis leaving a small cuff of  vein present.  The vein cuff was then closed with a running 5-0 Prolene  suture.  Clamp was removed from the brachial artery and adequate  hemostasis was noted.  Next, the large venous aneurysm was mobilized  from the subcutaneous tissue using a skin tunnel as far as it could be  reached from the antecubital incision.  Next, a separate incision was  made in the upper arm over the fistula, and the vein was ligated and  oversewn with a 5-0 Prolene suture proximally __________  region of the  cephalic vein.  The vein was fully mobilized and was resected throughout  the course of the upper arm.  Wound was irrigated with saline and  hemostasis with electrocautery.  The wounds were closed with  3-0 Vicryl in the subcutaneous and subcuticular tissue.  Benzoin and  Steri-Strips were applied.  A Kerlix and Ace wrap were applied over the  arm.  The patient tolerated the procedure without any complication and  was transferred to the recovery room in stable condition.      Larina Earthly, M.D.  Electronically Signed     TFE/MEDQ  D:  02/04/2008  T:  02/04/2008  Job:  [id number]

## 2011-03-05 NOTE — Consult Note (Signed)
NAMEFELIX, PRATT                  ACCOUNT NO.:  [id number]   MEDICAL RECORD NO.:  [id number]          PATIENT TYPE:  INP   LOCATION:  2109                         FACILITY:  [facility]   PHYSICIAN:  Gustavus Messing. Orlin Hilding, M.D.DATE OF BIRTH:  06-26-1941   DATE OF CONSULTATION:  04/03/2007  DATE OF DISCHARGE:                                 CONSULTATION   CHIEF COMPLAINT:  Altered mental status.   HISTORY OF PRESENT ILLNESS:  Ms. Schum is a 70 year old black woman with  a history of end-stage renal disease previously on hemodialysis with a  renal transplant in April 2007.  She was admitted after a spell of  unresponsiveness the night prior to admission.  She had checked her  blood sugar which was greater than 300, so she took an extra dose of  insulin.  The next morning she woke up as usual, seemed fine.  Her  husband said that she did not eat as much as usual, however.  She took a  nap in the afternoon and he could not arouse her from the nap.  The  duration of this time frame is uncertain.  It is uncertain whether she  had normal blood sugars in the morning.  I gather EMS was called.  She  was taken to the emergency room and required intubation for decreased  responsiveness and airway protection.  There her glucose was found to be  in the 30s.  This was corrected in the ER, but the duration of the  hypoglycemic episode is uncertain.  She was intubated initially for  airway protection and, of course, sedated with meds.  She now has been  weaned off the ventilator and some sedating meds and she is slowly  coming around, but she is not at baseline.  Her husband is certain,  however, that each day she is doing a little bit better since admission.   She had a similar but much less severe episode of toxic metabolic  encephalopathy just last month and she was admitted to [facility] with  a urinary tract infection.  She had a neurologic workup at that time  which included a CAT scan, MRI,  echo.  It appears that the conclusion  was that it was a toxic metabolic encephalopathy.   PAST MEDICAL HISTORY:  Significant for end-stage renal disease related  to diabetes.  She has had a renal transplant in 2007.  She had been on  dialysis, I believe.  She has a history of type 2 diabetes with  nephropathy, neuropathy and retinopathy and gastroparesis.  She has  hypertension, hyperlipidemia, hypothyroidism, gout, peptic ulcer  disease.  She had a parathyroidectomy, total abdominal hysterectomy, and  cervical decompression in the neck and degenerative disk disease.   MEDICATIONS:  Elita Quick 1 gram IV q.12h., recently started Aranesp once a  week, Lovenox 30 mg q.24h., Lasix 160 mg as a one-time dose,  hydrocortisone 50 mg IV q.8h.  She is getting tube feeds.  Normodyne 200  mg q.8h., Synthroid 75 mcg IV daily, Cellcept 250 mg q.12h., Protonix 40  mg daily, potassium  40 mEq p.r.n., Rapamune 7 mg daily, vancomycin 1250  mg every 36 hours just started, insulin sliding scale.  She has been  getting Diprivan drip.  I am not certain when the last dose was.  It  looks like the last dose of that was on the 11th of June.   ALLERGIES:  LATEX ALLERGY, PENICILLINS, ASPIRIN, OXYCODONE AND CONTRAST  MEDIA.  She does tolerate Elita Quick, apparently.   SOCIAL HISTORY:  She is married and functional.  Lives with her husband.  Does not use recreational drugs or drink alcohol.  She is a remote  smoker.   FAMILY HISTORY:  Noncontributory.   PHYSICAL EXAMINATION:  VITAL SIGNS:  T-max was 101.6, pulse 94, BP is  180/52, respirations 22 on 2 liters.  GENERAL:  She is awake, vocalizing but not verbalizing.  She tracks well  with her eyes but does not fixate.  She is not following commands.  She  will wince to pain and withdrawal 4 extremities equally to pain.  HEENT:  Pupils are equal and reactive.  There is no blink to threat on  either side.  Extraocular movements are intact.  There is no facial   asymmetry.  There is spontaneous blink that is normal.  EXTREMITIES:  She is moving all 4 extremities spontaneously but not to  command.  She seems to be exhibiting normal strength.  Deep tendon  reflexes are trace to 1+ in the upper extremities and knees.  Cannot get  them in the ankles.  She has an upgoing toe on the right and downgoing  toe on the left.  COORDINATION:  She is not cooperative, but no dysmetria seen with  spontaneous movement.  SENSORY:  She withdraws to pain in all 4 extremities.   DIAGNOSTIC DATA:  MRI scan of the brain shows nothing acute, mild sinus  disease, mild small vessel disease.  EEG shows diffuse slowing  consistent with a toxic metabolic encephalopathy.  No seizures were  seen.   LABORATORY DATA:  CBC shows white blood cell count of 13.7, platelets  161,000, hemoglobin 11.8, hematocrit 36.1.  BMP shows BUN 33, creatinine  1.86, glucose 130, calcium 7.8.   IMPRESSION:  Altered mental status, suspect toxic metabolic  encephalopathy related to hypoglycemia and prolonged by sedation and  ventilation.  Her exam, her MRI and her EEG are all nonfocal consistent  with encephalopathy.  We should have heightened concern for various  infectious agents given her immunosuppressive state.  She is slowly  improving and nothing is otherwise suggestive of neurologic infection at  this time.  She does have a fever.   RECOMMENDATIONS:  Would monitor closely, minimize potential sedating  drugs.  Consider LP if she worsens. Discussed this with Dr. Tyson Alias.      Catherine A. Orlin Hilding, M.D.  Electronically Signed     CAW/MEDQ  D:  04/03/2007  T:  04/03/2007  Job:  [id number]

## 2011-03-05 NOTE — Procedures (Signed)
EEG NUMBER:  [id number].   HISTORY:  This is a 70 year old who was found down, unresponsive, was  also found to be hypoglycemic.  The patient is having an EEG done to  evaluate for seizure activity.   PROCEDURE:  This is a portable EEG.   TECHNICAL DESCRIPTION:  Throughout this portable EEG there is no  distinct or sustained posterior dominant rhythm noted.  The background  activity is fairly symmetric mostly comprised of theta and delta range  activity at 20-40 microvolts.  At times there also seems to be triphasic  waves noted in the background.  There is also a tremendous amount of EMG  movement and electrode artifact that occasionally obscures the  background.  Photic stimulation nor hyperventilation were performed  throughout this recording.  Throughout this record the patient does not  go to sleep.  There is no definitive epileptiform activity noticed  throughout this entire tracing.  The EKG shows a heart rate of 90-100  beats per minute.   IMPRESSION:  This portable EEG is abnormal secondary to diffuse slowing.  The diffuse slowing is suggestive of a toxic metabolic or primary  neuronal disorder.  At times of the this tracing is technically limited  secondary to a tremendous amount of artifact that obscures the  background.  Would consider repeating the study if seizures are of high  concern.      Bevelyn Buckles. Nash Shearer, M.D.  Electronically Signed     ZOX:WRUE  D:  04/02/2007 17:06:33  T:  04/03/2007 09:01:19  Job #:  [id number]

## 2011-03-05 NOTE — Consult Note (Signed)
[name], [name]                  ACCOUNT NO.:  [id number]   MEDICAL RECORD NO.:  [id number]          PATIENT TYPE:  INP   LOCATION:  1848                         FACILITY:  [facility]   PHYSICIAN:  Cecille Aver, M.D.DATE OF BIRTH:  08/27/1941   DATE OF CONSULTATION:  03/31/2007  DATE OF DISCHARGE:                                 CONSULTATION   REASON FOR REFERRAL:  Status post renal transplant.   HISTORY OF PRESENT ILLNESS:  [name] is a 70 year old black female with  a past medical history significant for end-stage renal disease status  post cadaveric renal transplant on April 27 of 2007 (last creatinine  noted to be 1.77 on Mar 19, 2007.  Also had a transplant biopsy this  year, which was negative for rejection), hypertension, diabetes  mellitus, gout, hypothyroidism, spinal stenosis on chronic Duragesic  patch.  The patient was last seen by Dr. Eliott Russo on May 29 after a  hospitalization at [facility] for UTI and mental status changes.  She  actually had some sort of test yesterday per husband, possibly a VCUG  for frequent urinary tract infections, which went okay.  Sugar was noted  to be greater than 300 last night, so the patient took extra insulin.  This a.m., and actually up to 12:30, the patient was doing fine per  husband and other family.  Husband thinks that maybe she did not eat  enough.  Then went to take a nap this afternoon, and was unable to be  aroused this afternoon, so was sent to the emergency department.  She  was found to be unresponsive and was intubated urgently secondary to  decreased responsiveness.  Sugar was found to be in the 30s, which was  then corrected.  The patient is now intubated on sedation with some  spontaenous movement.  The creatinine is noted to be 2.1.   PAST MEDICAL HISTORY:  1. End-stage renal disease secondary to diabetes mellitus.  2. Status post cadaveric renal transplant in April of 2007.  She is on      Cellcept, Rapamune  and prednisone.  3. Diabetes mellitus.  4. Hypertension.  5. Hypothyroidism.  6. Peripheral neuropathy.  7. Gout.  8. Spinal stenosis with chronic pain on Duragesic patch.  9. Frequent UTIs due to transplant.  10.Anemia.   CURRENT MEDICATIONS:  1. Cellcept 250 mg b.i.d.  2. Prednisone 10 mg a day.  3. Rapamune 7 mg a day.  4. Tums 2 three times a day.  5. Potassium 20 mEq a day.  6. Seroquel 25 mg q.h.s.  7. Prilosec 20 mg a day.  8. Magnesium 400 mg a day.  9. Allopurinol 150 mg every other day.  10.Labetalol 200 mg t.i.d.  11.Lasix 40 mg b.i.d.  12.Vitamin D replacement 1 tab per week.  13.Neurontin 100 q.a.m., 200 q.p.m.  14.Lipitor 10 mg a day.  15.Synthroid 175 mcg a day.  16.Lantus insulin 30 units a day.  17.Colchicine 0.6 mg every other day.  18.Duragesic patch at 50.  19.Calcitriol  0.5 daily.  20.Sliding scale insulin.   ALLERGIES:  1. PENICILLIN.  2. OXYCODONE.   SOCIAL HISTORY:  The patient lives with husband and is very functional.   FAMILY HISTORY:  Noncontributory.   REVIEW OF SYSTEMS:  Unable to obtain secondary to intubation and  sedation.  Husband reports that she was fine.   PHYSICAL EXAMINATION:  VITAL SIGNS:  There is no temperature.  Blood  pressure 180/80, heart rate is 83, respirations are normal on the vent,  pulse ox is 100%.  GENERAL:  The patient is intubated and sedation with some spontaneous  movement, but no eye opening.  HEENT:  Pupils are small.  NECK:  There is no jugular venous distention, no bruits.  LUNGS:  Anteriorly with some coarse breath sounds bilaterally.  CARDIOVASCULAR:  Regular rate and rhythm with no murmur, gallop, rub  appreciated.  ABDOMEN:  Soft and nontender.  Kidneys in the right lower quadrant is  also nontender.  EXTREMITIES:  Reveal no peripheral edema.  She has as left upper  extremity AV fistula with good thrill and bruits.   LABORATORY DATA:  White blood count 7.7, hemoglobin 12.1, potassium 3.9,   BUN and creatinine 35 and 2.1, troponin less than 0.05.   Chest x-ray reveals some edema, left greater than right.  Head CT shows  no acute findings.   ASSESSMENT:  A 70 year old black status post renal transplant, now is  unresponsive, thought presumably to decreased sugar.  1. Unresponsive:  Decreased sugars, now been resolved.  No other      etiologies have been found.  Head CT is negative.  We will make      sure urine with culture is sent, as well as blood cultures given      chronic immunosuppressed status.  We will hold pain medicines.      Support for now on ventilator.  2. Status post renal transplant:  Creatinine is slightly above      baseline up between 1.7 and 1.9.  She has good urine output in the      emergency department, however.  She was placed on stress dose      steroids per primary team.  We will hold Cellcept and Rapamune for      tonight.  The patient will likely need dose of Rapamune in the a.m.      We will give p.o. if extubated versus another route if needed.  We      will be following urine output and creatinine closely.  3. Hypertension:  Possibly due to stress dose steroids.  We will treat      with p.r.n. agents while intubated.  4. Gout:  Hold p.o. meds for now.  5. Diabetes mellitus:  This will be monitored closely on the      Glucommander.   Thank you very much for this consultation.  We will continue to follow  with you.           ______________________________  Cecille Aver, M.D.     KAG/MEDQ  D:  03/31/2007  T:  04/01/2007  Job:  [id number]

## 2011-03-05 NOTE — H&P (Signed)
NAMESYMONE, CORNMAN                  ACCOUNT NO.:  [id number]   MEDICAL RECORD NO.:  [id number]          PATIENT TYPE:  INP   LOCATION:  5508                         FACILITY:  [facility]   PHYSICIAN:  Tera Mater. [place], M.D. DATE OF BIRTH:  [birth year]   DATE OF ADMISSION:  02/19/2009  DATE OF DISCHARGE:                              HISTORY & PHYSICAL   HISTORY OF PRESENT ILLNESS:  Ms. Murph is a 70 year old black female with  a history of type 2 diabetes, renal transplant, prior UTI, hypertension,  hyperlipidemia, and gastroparesis who presents for evaluation.  She had  been having malaise with poor p.o. intake for the last 1-2 days.  Her  blood sugars have actually been up in the 160s-170s today without lows.  She had some flank pain yesterday that is resolved today.  She had no  urinary hesitancy.  She has had no GI upset but just anorexia.  She  notes no breathing trouble or chest pain.  She has had no palpitations  of tremors.  She has been globally weak with no focal weakness.  Her  vision has been into the baseline.  She thought she might be anemic and  came to the emergency room for evaluation.  She was found to have a  significant urinary tract infection with early pyelonephritis and  therefore is being admitted.  She was last here in the hospital from  January 18, 2009 until January 27, 2009 with a bad UTI as well.  She has been  seen once back in the office.   PAST MEDICAL HISTORY:  Includes renal transplant, that was cadaveric in  2007.  She has had prior urosepsis on several occasions.  She has type 2  diabetes dating back about 25 years.  She has retinopathy, nephropathy,  and gastroparesis.  She has neuropathy as well.  She has had  gastroesophageal reflux disease.  She has had peptic ulcer disease.  She  has had hypothyroidism, secondary hyperparathyroidism with partial  parathyroidectomy.  She has had anemia due to renal disease.  She has  had gout and hyperlipidemia.  She has  both a fistula in the left upper  extremity with aneurysmal tear around this.  She has had hypertension.  She has had hysterectomy, cervical decompression, and chronic low back  pain.  She had significant encephalopathy due to a low blood sugar in  the past.   ALLERGIES:  1. IVP DYE.  2. IODINE.  3. LATEX.  4. PENICILLIN.  5. TETRACYCLINE  6. VICODIN,  7. TYLENOL.   CURRENT MEDICATIONS:  1. Labetalol 200 three times a day.  2. Prilosec 20 once daily.  3. Lasix 40 twice daily.  4. Rapamune 4 mg once daily.  5. Prednisone 10 mg daily.  6. Synthroid 175 mcg daily.  7. Lipitor 40 mg daily.  8. Magnesium oxide 40 mg daily.  9. Allopurinol 150 mg daily.  10.CellCept 500 twice daily.  11.Potassium 20 mEq twice daily.  12.Calcitriol 0.5 mg once daily.  13.Vitamin D 5000 units weekly.  14.Neurontin 100 mg 3 times daily.  15.Lantus  32 in the a.m.  16.Duragesic patch 62 mcg of 50+12 q.72 h.   She has been on Bactrim suppression Monday, Wednesday, and Friday and  Macrobid 100 at bedtime.  She finished out a course of vancomycin last  time.  The other medication is amlodipine 5 once daily.   PHYSICAL EXAMINATION:  VITAL SIGNS:  Here in the emergency room blood  pressure 138/85, pulse 90 and regular sinus rhythm, respiratory rate is  14 and unlabored, and temperature is 98.7.  GENERAL:  We have fairly healthy-appearing black female lying quietly in  no distress.  HEENT:  Some swelling around her left eye is noted.  Extraocular  movements are intact with no nystagmus.  Oral mucous membranes appear  moist.  NECK:  Supple without JVD or bruits.  LUNGS:  Clear without wheezes, rales, or rhonchi.  No accessory muscle  use.  HEART:  Regular with systolic murmur.  ABDOMEN:  Soft, nontender with well-healed scars.  Bowel sounds are  somewhat hyperactive with no evidence of pulsations or masses.  EXTREMITIES:  Bounding distal pulses with no peripheral edema.  NEUROLOGIC:  The patient is  awake and alert.  Her mentation is  excellent.  She is at her baseline.   LABORATORY DATA:  CT of the abdomen and pelvis showed a lesion in the  mid pole of the left kidney 1.4 cm in diameter and 2.2 cm lesion in the  right kidney that has developed rim calcifications since prior exam  raising the question of this being an abnormality that could include  malignant features.  Other labs today, her sodium is 142, potassium 3.8,  chloride 101, CO2 30, BUN 25, creatinine 2.32, and glucose 183.  A  bilirubin is 0.9, alk phos is 70, SGOT is 13, SGPT is 11, total protein  6.8, albumin 3.1, and calcium 8.6.  A urine microscopic is too numerous  to count white cells and 3-6 reds.  The pH was 7, glucose 30 mg percent,  and positive nitrates.  INR was 1.0.  White count is 9700, hemoglobin  10.6, and platelets 153,000.  Ionized calcium was 1.06.  Magnesium was  2.3.   SUMMARY:  A 70 year old black female being admitted with early  pyelonephritis.  Fortunately, she is not as sick as she has been on  other admissions.  Her renal parameters are higher than I would like and  we are going to give her some gentle hydration with some saline.  She  has not had much trouble with the heart in the past.  We are going to  cover her with cefepime as the drug that she has gotten down here.  I am  also going to give vancomycin dosing by pharmacy based on her prior  cultures just for full coverage.  Her blood sugars will be covered with  a sliding scale and blood pressure medications and immunosuppressives  will be continued.  Her thyroid status appears stable and lipid agents  will be continued.           ______________________________  Tera Mater Evlyn Kanner, M.D.     SAS/MEDQ  D:  02/19/2009  T:  02/20/2009  Job:  [id number]

## 2011-03-05 NOTE — Discharge Summary (Signed)
NAMEIANA, BUZAN                  ACCOUNT NO.:  [id number]   MEDICAL RECORD NO.:  [id number]          PATIENT TYPE:  INP   LOCATION:  5508                         FACILITY:  [facility]   PHYSICIAN:  Tera Mater. Evlyn Kanner, M.D. DATE OF BIRTH:  1941/02/22   DATE OF ADMISSION:  02/19/2009  DATE OF DISCHARGE:  02/21/2009                               DISCHARGE SUMMARY   DISCHARGE DIAGNOSES:  1. Pyelonephritis due to Citrobacter freundii, clinically improved.  2. Type 2 diabetes with reasonable control.  3. Renal transplant with a transient elevation in creatinine, now back      to baseline.  4. Hypertension, under good control.  5. Primary hypothyroidism.  6. Hyperlipidemia.  7. Vitamin D deficiency.  8. Diabetic neuropathy.  9. Chronic back pain.   CONSULTATIONS:  None.   PROCEDURES:  A CT of the abdomen and pelvis on Feb 19, 2009.   HISTORY OF PRESENT ILLNESS:  Ms. Renner is a 70 year old black female with  a history of longstanding type 2 diabetes, renal transplant, prior UTI,  hypertension, hyperlipidemia, and gastroparesis who I admitted on Feb 19, 2009.  She presented with abnormal urine with dehydration and elevated  white count.  She had early pyelonephritis and was starting to get ill  but not near sick as she had been at other times.  She was treated with  broad-spectrum antibiotics initially with both cefepime and vancomycin.  Cultures started growing Gram-negative rods and she was narrowed  cefepime alone, now we have the organism identified as Citrobacter and  now she is ready for discharge home on Macrobid.  Her blood sugars have  been okay during the hospitalization.  Her creatinine has returned back  to her baseline that has been noted below, the blood pressure has been  under good control, the mild dehydration has been replaced, and her  hypothyroidism is doing well.  She is feeling well.  She is eating well.  She has had no other complications of treatment.  She will return  home  with diet to be no concentrated sweets.  She should check her sugar 3  times a day.   MEDICATIONS:  1. Labetalol 300 three times a day.  2. Lasix 40 twice daily.  3. Rapamune 1 mg 3 times daily.  4. Prednisone 5 mg daily.  She was on a higher dose here.  5. Synthroid 175 mcg daily.  6. Lipitor 40 mg daily.  7. Magnesium oxide 400 daily.  8. Allopurinol 150 daily.  9. CellCept 500 twice daily.  10.Potassium 20 mEq twice daily.  11.Calcitriol 0.5 mcg once daily.  12.Vitamin D 50,000 units every other week.  13.Gabapentin 100 in the morning and 200 in the evening.  14.Duragesic patch, she takes two of them for a total of 62 mcg every      3 days.  15.She is on Lantus 35 units in the morning.  16.She was on a Humalog sliding scale as before.  17.She has Tums 2 with each meal.  18.Excedrin p.r.n.   LABORATORY DATA/RADIOLOGY TESTING:  CT abdomen and pelvis shows the  lung  bases are fine.  There was interval development of calcification with a  right renal lesion, probably a cyst.  A contrast enhanced exam was  suggested to exclude malignant features.  Ultrasound was recommended and  this would be done as an outpatient.  The right iliac fossa transplant  kidney looked okay with noncontrast diverticula were noted with no  diverticulitis, small amount of free pelvic fluid, uterus was surgically  absent, and no pelvic abscess was present.  Her urine culture showed  should Citrobacter freundii which was resistant to cefazolin, sensitive  to ceftriaxone, intermediate to Cipro and levofloxacin, resistant to  gentamicin, sensitive to nitrofurantoin, sensitive to tobramycin, and  resistant to Bactrim.  This morning's chemistries, sodium 144, potassium  4.0, chloride 108, CO2 31, BUN 18, creatinine 1.83, glucose 205, and  calcium is 8.9.  White count yesterday was 6200, hemoglobin 9.8,  platelets 199,000.  At presentation, urine microscopic showed too  numerous to count white cells and  3-6 red cells.  It was turbid with a  specific gravity of 1.0-3, pH of 7, small blood, 100 mg percent protein,  large leukocytes, and positive nitrite.  INR was 1 and magnesium was  2.3.  Troponin was less than 0.05 and myoglobin was 389.  Initial white  count was 9700, hemoglobin 10.6, and platelets 193,000.  Initial  chemistries:  Sodium 142, potassium 3.8, chloride 101, CO2 30, BUN 25,  creatinine 2.32, and glucose 183.  SGOT was 13, SGPT was 11, total  protein 6.8, albumin 3.1, and calcium 8.6.   In summary, we have a 70 year old black female presenting with early  pyelonephritis due to Citrobacter.  She was hydrated and covered with  broad-spectrum antibiotics.  Blood culture has been negative.  Urine  culture is as noted above.  She is discharged home in improved  condition.  She will see me in 2-3 weeks.  We will plan a renal  ultrasound at that time.           ______________________________  Tera Mater Evlyn Kanner, M.D.     SAS/MEDQ  D:  02/21/2009  T:  02/22/2009  Job:  [id number]

## 2011-03-05 NOTE — Assessment & Plan Note (Signed)
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  This was done as an  outpatient.  She has done quite well following this.  She has resolved  all bruising around the time of surgery.  She has excellent brachial  artery pulse and radial pulse.  She does have a very large brachial  artery due to the longstanding AV fistula.  She does still have some  redundant skin overlying the area of  the prior large venous aneurysms.  She is concerned regarding the appearance of this.  I explained that I  would allow this to continue to resolve over the course of several  months and I feel that this may continue to improve for her.  I  explained the only option would be a potential resection of redundant  skin by plastic surgery.  She understands this and will see [place] again on  an as-needed basis.   Larina Earthly, M.D.  Electronically Signed   TFE/MEDQ  D:  02/26/2008  T:  02/27/2008  Job:  [id number]   cc:   [facility]. Eliott Nine, M.D.

## 2011-03-05 NOTE — Assessment & Plan Note (Signed)
OFFICE VISIT   [name], [name]  DOB:  Oct 14, 1941                                       07/31/2007  LOVFI#:[id number]   Patient presents today for evaluation of her left upper arm AV fistula.  She is well known to me from prior access.  She had some initial left  Ciminio wrist fistula placed in May, 2000.  She had poor maturation of  this and underwent placement of left upper arm AV fistula in December,  2001.  She subsequently had ligation of her left wrist fistula due to  steal symptoms in her left hand.  She then went on to develop a very  nice upper arm fistula that she used for many years without difficulty.  She did develop some aneurysmal change, but this did not affect her use  of this.  I had last seen her three years ago when she was still using  the graft and had no evidence of ulceration or skin breakdown over the  fistula.  She then underwent kidney transplants successfully two years  ago and has not used her fistula for two years.  I am seeing her for  discussion of potential ligation.  She is here today with her husband.  Her overall health remains stable.  She does have insulin-dependent  diabetes.   PHYSICAL EXAMINATION:  A well-developed and well-nourished black female  appearing her stated age of 66.  Blood pressure is 134/63, pulse 75,  respirations 18.  She has a small incision over the level of her radial  artery at the wrist.  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  She has regular flow through this with a thrill  present.  On occlusion of her fistula digitally, she does have palpable  left radial pulse.  This is absent with the fistula patent and open.  She reports that she does have some shoulder discomfort and pain up into  her left temporal region as well.   I had a long discussion with Ms. Sandhu with her husband present.  I  explained that her fistula could be ligated and resected.  I explained  that  this may provide some symptom relief.  She reports this as bulky  and in the way of her left arm, especially with sleeping.  She also  reports what sounds like moderate steal with this.  I explained that  ligation of the fistula would burn that bridge for future AV access in  her left arm.  Currently, her kidney is working quite successfully, but  she understands that this could fail and then would require the use of a  fistula again.  She will consider this and notify, should she wish to  proceed with ligation/resection of her venous aneurysms from her  fistula.   Larina Earthly, M.D.  Electronically Signed   TFE/MEDQ  D:  07/31/2007  T:  08/03/2007  Job:  [id number]   cc:   [facility]. Eliott Nine, M.D.

## 2011-03-05 NOTE — Discharge Summary (Signed)
[name], PERDUE                  ACCOUNT NO.:  [id number]   MEDICAL RECORD NO.:  [id number]          PATIENT TYPE:  INP   LOCATION:  6732                         FACILITY:  [facility]   PHYSICIAN:  Tera Mater. Evlyn Kanner, M.D. DATE OF BIRTH:  [birth year]   DATE OF ADMISSION:  05/10/2009  DATE OF DISCHARGE:  05/17/2009                               DISCHARGE SUMMARY   DISCHARGE DIAGNOSES:  1. Acute pyelonephritis with no organism identified, clinically      improved.  2. Renal transplant with stable renal status.  3. Type 2 diabetes with recurrent mild hypoglycemia.  4. 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  5. Retinopathy with stable status.  6. Gastroparesis without flare.  7. Primary hypothyroidism, clinically stable.  8. Secondary hyperparathyroidism with stable calcium levels.  9. Anemia of chronic disease.  10.Gout without flare.  11.Hyperlipidemia, under therapy.  12.Hypertension with good control.  13.Prior encephalopathy with no evidence of worsening.  14.Myoclonus and fasciculations, resolved.   Ms. [name] is a 70 year old black female longstanding patient in my  practice who presented to the emergency room and was admitted by me on  May 10, 2009.  She was showing signs of pyelonephritis, but did not yet  have sepsis syndrome.  The patient has done well.  Her urine cultures  have not shown any particular organism, so we have had to use empiric  therapy.  We covered her fairly widely due to her immunocompromised  status.  Fortunately, she has clinically done well.  She has been  afebrile on oral antibiotics after 24 hours.  Her renal transplant  function has been good and she was seen initially by the Nephrology team  and followed for a day or two.  Fortunately, this has not been an issue  of worrisome nature.  She has a seroma that we had been following, it  does not appear to be infected by CAT scan.  Her type 2 diabetes has  been doing  fair.  She actually dropped a few blood sugars to the minor  50s and 60s.  She tolerated this fairly well with backed off in her  therapy and this has improved.  Her primary hypothyroidism has done well  and blood pressures has been reasonable.  Overall, she is clinically  much improved.  Her mental status has improved.  She is now able to  ambulate.  Bowels are working well.  She is eating well and looking the  best she has.  She had have some periorbital edema as well that has  resolved during this hospitalization.  This morning's vital signs, blood  pressure 119/75, pulse is 74, respirations is 20, temperature 98.2, O2  sats 94%, and fasting blood sugar this morning was 140.   Lab studies reveal a rapamycin sirolimus level 14.9 ng/mL with normal up  to 18.  White count was 2900 yesterday, hemoglobin 10.5, platelets  137,000, MCV of 82.  CK was slightly elevated 292 with normal MB and  relative index of 0.7.  A urine culture showed 9000  colonies of  insignificant growth with no organism identified in the May 13, 2009.  Chemistries on May 15, 2009, sodium 144, potassium 4.3, chloride 100,  CO2 of 37, BUN 19, creatinine 1.81, glucose of 140.  At presentation,  urinalysis showed specific gravity of 1.009 with small leukocytes and 0-  2 white cells.  The CT scan, however, did show some stranding suggesting  the pyelonephritis.  Initial white count was 4400, hemoglobin 11.6,  platelets 159,000.  There were 7-10 white cells actually on the first  urine specimen.  Lipase was normal at 14.  A1c was up a bit at 7.5%.  TSH was fine at 2.468.  Blood cultures were done on the May 10, 2009,  x2, those were both negative.  Repeat blood cultures were done on May 12, 2009, and those were both negative.   RADIOLOGY STUDIES:  A CT of the head without contrast was done on May 13, 2009, no acute abnormality.  Rib x-rays were done with on May 11, 2009, with no evidence of rib fracture,  pneumothorax, there was  cardiomegaly and pulmonary venous congestion noted.  CT abdomen and  pelvis was done at presentation with bilateral renal artery stenosis,  collecting system calcifications, right iliac fossa, renal allograft,  adrenal glands were normal.  There was stranding of the subcutaneous  tissues with anasarca changes, cholecystectomy was noted, iliofemoral  atherosclerosis noted, a urethral diverticulum was noted, colonic  diverticulosis was present, and there was a seroma in the right lower  quadrant.   In summary, we have a 70 year old immunocompromised renal transplant  patient with type 2 diabetes presenting with urine infection and early  pyelonephritis.  The patient actually was moderately ill at presentation  and has done better than expected.  She did have some unusual problems  over the weekend with some myoclonus and fasciculations, which have now  totally resolved.  At the present time, she does seem to be back to her  baseline and I think ready for discharge home.   Her medications at discharge will include Lantus actually at a lower  dose of 27 units with a sliding scale of Humalog three times a day,  labetalol 300 three times a day, Prilosec 20 once daily, Lasix 80 once  daily, Rapamune 4 mg once daily, prednisone 5 mg once daily, Lipitor 40  mg daily, magnesium oxide 400 daily, CellCept 500 twice daily.  Bactrim  DS one on Monday, Wednesday, and Friday as prophylaxis, calcitriol 0.5  daily, Neurontin 100 in the morning and 200 in the evening, Duragesic  patch she takes a total of 62 mcg and two patches every 3 days and  Synthroid 125 mcg.  She will also be on ciprofloxacin 500 mg twice a day  for an additional 3 days just to finish out her course.  Of note, there  had been a question about allergy to this.  Apparently, her blood sugars  had gone up one time when she was on this.  She has had no rash, no  itching, no hives, no other compromise, or other  issue of particular  problems with Cipro.  She will see me back in about 2 weeks.  Her diet  is to be as before.  No pain management changes are noted.  She is still  on Duragesic.           ______________________________  Tera Mater. Evlyn Kanner, M.D.     SAS/MEDQ  D:  05/17/2009  T:  05/17/2009  Job:  [id number]   cc:   [facility]  [place] [facility]. Eliott Nine, M.D.

## 2011-03-05 NOTE — H&P (Signed)
[name], [name]                  ACCOUNT NO.:  [id number]   MEDICAL RECORD NO.:  [id number]          PATIENT TYPE:  INP   LOCATION:  5523                         FACILITY:  [facility]   PHYSICIAN:  Tera Mater. Evlyn Kanner, M.D. DATE OF BIRTH:  Aug 04, 1941   DATE OF ADMISSION:  10/02/2007  DATE OF DISCHARGE:                              HISTORY & PHYSICAL   [name] is a 71 year old black female with longstanding type 2  diabetes, fairly recent renal transplant, hypertension, gastroparesis,  who presents for evaluation.  She has had 2-3 urinary tract infections  treated over at [facility] left, and I have gotten records on those that  are not in front of me now.  She apparently had recently been on sulfa  agents for this and completed this fairly recently.  One to two days  prior to admission, however, she began to have fever or at least chills  and malaise, low back pain, some bladder irritability, and then  developed frank hematuria prior to coming in.  She spent much of  yesterday in bed.  She did not eat a whole lot.  Blood sugars have been  variable but not low.  She has had no unusual travel or exposures.  No  one else been ill.  Evaluation in the emergency room suggested that she  had a significant urinary tract infection.  In addition in keeping with  her prior pattern, she had some mental status changes when she was sick,  and that brought her in for evaluation.  She is admitted now with a  presumed diagnosis of sepsis from urinary source.   PAST MEDICAL HISTORY:  1. Type 2 diabetes with nephropathy, neuropathy, retinopathy.  Her      diagnosis was in 1976, and she has been on insulin much of this      time.  2. She has had end-stage renal disease that is about 10 years in      process, and she had a cadaveric renal transplant in 2007.  3. She has gastroparesis.  4. Hypertension.  5. Hyperlipidemia.  6. Primary hypothyroidism.  7. Gout.  8. Peptic ulcer disease.  9. She has had  secondary hyperparathyroidism and has undergone at      least one parathyroidectomy.  10.She had a hysterectomy.  11.Cervical decompression in the past.   MEDICATIONS:  List includes:  1. Lantus 32 units.  2. CellCept 500 twice daily.  3. Magnesium oxide 200 daily.  4. Vitamin D 150,000 every Thursday.  5. Allopurinol 150 daily.  6. Omeprazole 20 twice daily.  7. Synthroid 175 daily.  8. Prednisone 10 mg daily.  9. Rapamune 5 mg daily.  10.Labetalol 300 three times a day.  11.Lipitor 40 a day.  12.Lasix 40 twice daily.  13.Potassium 20 mEq daily.  14.Calcitriol 0.5 daily.  15.Neurontin 100 in the morning and 200 in the evening.   SOCIAL HISTORY:  She is married. Her husband, [name], is also a patient of  mine.  She is a nonsmoker, nondrinker.   PHYSICAL EXAMINATION:  VITAL SIGNS:  Currently on exam, blood  pressure  is 141/64; it was 130 systolic when she came in. Pulse is 81 and  regular, respirations 20, temperature 99.5.  GENERAL:  We have a mildly ill-appearing black female sitting up in no  distress.  There is some edema around her left eye that is noninfectious  in appearance. Extraocular movements are intact.  Face is symmetrical.  Mucous membranes are moist.  NECK:  Supple without bruits.  No thyromegaly is present.  LUNGS:  Clear with no wheezes, rales, rhonchi.  HEART:  Regular with a mitral systolic murmur.  ABDOMEN:  Soft with slightly hypoactive bowel sounds.  No pulsations or  masses are noted.  Multiple scars obviously are noted.  EXTREMITIES:  Reveal pulses to be relatively well preserved with no  edema.  There is fistula in the left arm.  NEUROLOGIC:  The patient is awake, alert.  Her mentation now appears  good.  Speech is clear and fluent.  Memory is good.  No resting tremors  present.   LABORATORY DATA:  Urine microscopic shows 11-20 rbc's and too-numerous-  to-count wbc's with a specific gravity of 1.015, pH of 5, large blood,  100 mg percent  protein, and  large leukocytes.  White count is 7100,  hemoglobin 11.5, platelets 195,000.  Her creatinine is 2.9 which is a  bit higher than her baseline which is about 2.5.  Her sodium is 138,  potassium 4.2, chloride 104, CO2 33, and BUN is 40.   A chest x-ray shows mild cardiac enlargement with findings suggestive of  very early vascular congestion. They could not exclude viral  pneumonitis.   ASSESSMENT AND PLAN:  In summary, we have a 70 year old black female  with known renal transplant and longstanding diabetes presenting with a  repeated urinary tract infection.  The problem now is some early sepsis  with mental status changes.  At the moment, she is hemodynamically doing  fairly well but clearly needs to have an inpatient evaluation and broad-  spectrum antibiotics.  The ER physicians have already started her on  Zosyn, watching her sugars carefully.  Will watch for any worsening  decline.  We need to watch renal function and anti-rejection drugs  carefully.  There is no evidence of a cardiac event going on here.  I do  not think there is any other infectious source.           ______________________________  Tera Mater. Evlyn Kanner, M.D.     SAS/MEDQ  D:  10/03/2007  T:  10/04/2007  Job:  [id number]

## 2011-03-05 NOTE — Discharge Summary (Signed)
[name], [name]                  ACCOUNT NO.:  [id number]   MEDICAL RECORD NO.:  [id number]          PATIENT TYPE:  INP   LOCATION:  5523                         FACILITY:  [facility]   PHYSICIAN:  Tera Mater. Evlyn Kanner, M.D. DATE OF BIRTH:  [birth year]   DATE OF ADMISSION:  10/02/2007  DATE OF DISCHARGE:  10/06/2007                               DISCHARGE SUMMARY   DISCHARGE DIAGNOSES:  1. An urinary tract infection with early signs of sepsis.  2. Type 2 diabetes.  3. Renal transplant with mild elevations of creatinine.  4. Hypothyroidism with clinical stability.  5. Secondary hyperparathyroidism.  6. Anemia with stable levels.  7. Hyperlipidemia.   CONSULTATIONS:  Renal on December 14.  Procedures were none.   HISTORY OF PRESENT ILLNESS:  [name] is a 70 year old black female with  recent renal transplant, repeated UTI, presented to me on October 03, 2007.  She was ill with early sepsis and mental status changes.  She was  hemodynamically fairly stable but creatinine had risen.  She had  leukocytosis and some tachycardia.  Fortunately, she responded well to  antibiotics and fluid resuscitation.  Renal was consulted to help on her  transplant status.  The patient broke her fever on December 15 and was  doing much better.  She has careful and close follow-up with the doctors  at [facility], and she was discharged home with a follow-up  the same day over there.  We never did get any blood and urine culture  sent, and empiric Cipro was given for treatment.  The blood sugars were  variable, but acceptable and would not cause admission in and of  themselves.  The patient did have some adjustments as she left.   DISCHARGE MEDICATIONS:  1. Lantus 35 units.  2. CellCept 500 mg twice daily.  3. Magnesium oxide 2 mg once daily.  4. Vitamin D 50,000 units three caps every Thursday.  5. Allopurinol 150 daily.  6. Omeprazole 20 twice daily.  7. Synthroid 175 mcg daily.  8.  Prednisone 10 mg daily.  9. Rapamune 5 mg daily.  10.Labetalol 300 three times a day.  11.Lipitor 40 daily.  12.Lasix 40 twice daily.  13.New medications included Cipro 500 once a day for a week.   LABORATORY DATA:  During this hospitalization, initial white count was  10,100, hemoglobin 11.5, platelets 195,000.  Final white count was 4500,  hemoglobin 10.1, platelets 212,000.  Initial chemistry, sodium 138,  potassium 4.2, chloride 104, BUN 40, creatinine 2.89, glucose of 197.  Highest sugar we measured was 309 on the 15th and phosphorous level was  4.7, hemoglobin A1c was 7.6%.  Urinalysis showed large myoglobin, large  leukocyte esterase, too numerous to count white blood cells in the  urine, 11-20 red cells.  Radiology testing revealed a 2-view chest that  was no acute disease.   ASSESSMENT:  In summary, we have an immunocompromised 70 year old black  female after renal transplant presenting with recurrent UTI and early  signs of sepsis.  Fortunately, she responded well to measures that were  taken.  She was discharged home in improved condition on October 06, 2007.   DISCHARGE INSTRUCTIONS:  1. Diet was to be renal and diabetes restrictions as before.  She      check her sugars as before.  No pain management was necessary.           ______________________________  Tera Mater Evlyn Kanner, M.D.     SAS/MEDQ  D:  11/05/2007  T:  11/05/2007  Job:  [id number]

## 2011-03-06 ENCOUNTER — Other Ambulatory Visit: Payer: Self-pay | Admitting: Nephrology

## 2011-03-06 ENCOUNTER — Encounter (HOSPITAL_COMMUNITY): Payer: Medicare Other | Attending: Nephrology

## 2011-03-06 DIAGNOSIS — D638 Anemia in other chronic diseases classified elsewhere: Secondary | ICD-10-CM | POA: Insufficient documentation

## 2011-03-06 DIAGNOSIS — E119 Type 2 diabetes mellitus without complications: Secondary | ICD-10-CM | POA: Insufficient documentation

## 2011-03-06 DIAGNOSIS — N183 Chronic kidney disease, stage 3 unspecified: Secondary | ICD-10-CM | POA: Insufficient documentation

## 2011-03-06 DIAGNOSIS — N2581 Secondary hyperparathyroidism of renal origin: Secondary | ICD-10-CM | POA: Insufficient documentation

## 2011-03-06 LAB — URINALYSIS, ROUTINE W REFLEX MICROSCOPIC
Glucose, UA: NEGATIVE mg/dL
Specific Gravity, Urine: 1.019 (ref 1.005–1.030)
Urobilinogen, UA: 0.2 mg/dL (ref 0.0–1.0)

## 2011-03-06 LAB — URINE MICROSCOPIC-ADD ON

## 2011-03-06 LAB — COMPREHENSIVE METABOLIC PANEL
AST: 16 U/L (ref 0–37)
BUN: 30 mg/dL — ABNORMAL HIGH (ref 6–23)
CO2: 30 mEq/L (ref 19–32)
Calcium: 9.9 mg/dL (ref 8.4–10.5)
Creatinine, Ser: 1.74 mg/dL — ABNORMAL HIGH (ref 0.4–1.2)
GFR calc Af Amer: 35 mL/min — ABNORMAL LOW (ref >60)
GFR calc non Af Amer: 29 mL/min — ABNORMAL LOW (ref >60)

## 2011-03-08 NOTE — Discharge Summary (Signed)
[place]. [facility]  Patient:    [name], [name]                         MRN: [id number] Adm. Date:  [id number] Disc. Date: [id number] Attending:  Dayle Points CC:         Duke Salvia. Eliott Nine, M.D.   Discharge Summary  CENTRAL [place] NUMBER:  [id number]  FINAL DIAGNOSES: 1. Secondary hyperparathyroidism. 2. Severe renal insufficiency. 3. Type 1 diabetes mellitus. 4. Hypothyroidism. 5. Severe hypocalcemia - postoperative complication.  HISTORY:  This 70 year old female was known to have severe renal insufficiency to the point of possibly requiring hemodialysis in the very near future.  The patient has had a venous access device inserted for this purpose.  She developed secondary hyperparathyroidism which was unresponsive to medical management.  She complained of severe itching and bone pain and also chronic fatigue.  CURRENT MEDICATIONS:  1. Renagel 800 mg 2 tablets before meals.  2. Synthroid 0.15 mg daily.  3. Adalat CC 60 mg b.i.d.  4. Lasix 40 mg 4 tablets in the morning and 2 tablets at lunch.  5. Atenolol 50 mg daily.  6. ______  0.25 mg every other day.  7. Estropipate 0.625 mg daily.  8. Catapres TTS 1 every 5 days.  9. Lipitor 10 mg daily. 10. Neurontin 100 mg 3 capsules at bedtime. 11. Ultram 50 mg p.r.n. 12. Humulin L 25 units in the morning and 5-10 units in the afternoon. 14. Epoetin alfa 6000 units weekly.  PHYSICAL EXAMINATION:  Unremarkable except for the finding of an AV fistula in the left wrist.  HOSPITAL COURSE:  On the day of admission, the patient underwent total parathyroidectomy and autotransplantation of parathyroid tissue to the right forearm under general endotracheal anesthesia.  She tolerated the procedure well.  Four glands were found in the moves.  Postoperative course was complicated by severe, unrelenting, hypocalcemia.  She also developed severe hypokalemia in the early postoperative period, and that was  treated with Kayexalate.  By August 06, 2000, the serum calcium was down to 6.1 mg%, and she was receiving Rocaltrol and calcium supplementation.  Her neck staples were removed on August 07, 2000.  She continued to receive two grams of elemental calcium q.i.d. as well as Rocaltrol 4 mcg each morning.  Her serum calcium continued to be low.  Her serum magnesium was measured and was at low-normal at 1.6 mg%.  Magnesium was replaced.  By August 10, 2000, the serum calcium was stable enough for the patient to be discharged.  DISCHARGE MEDICATIONS:  She was therefore discharged on: 1. Calcium carbonate 2 grams q.i.d. 2. ______  4 mcg daily. 3. Lipitor 10 mg daily. 4. Catapres patch once per week. 5. Premarin 0.625 mg daily. 6. Neurontin 300 mg daily. 7. Atenolol 50 mg daily. 8. Adalat 60 mg b.i.d. 9. Synthroid 0.15 mg daily. 10. Lasix 160 mg in the morning and 80 mg in the evening. 11. Humulin L 25 units in the a.m. and 10 units in the evening. 12. Colchicine 0.6 mg 1 b.i.d. 13. EPO 8000 units subcutaneously q week.  DIET:  Kidney and diabetic diet.  FOLLOW-UP:  She was to return to the renal service in three days, and she was to return to see the attending surgeon in one week for staple removal from right forearm.  CONDITION ON DISCHARGE:  Improved. DD:  08/26/00 TD:  08/27/00 Job: [id number]

## 2011-03-08 NOTE — Assessment & Plan Note (Signed)
MEDICAL RECORD NUMBER:  [id number].   [name] is back regarding her right leg pain as related to her lumbar  radiculopathy.  Pain continues to be well controlled at 2/10.  She is no  longer using Vicodin.  She uses Neurontin 200 mg b.i.d. and Cymbalta 60 mg  daily.  She is pleased with her progress.  She is walking without a cane in  the home but uses a cane out of the house.  She is walking about a quarter  of a mild on a regular basis.  She is still awaiting kidney transplant  although she came very close recently.  She is pleased with her progress.  She has some tingling still on the right leg but is not to the point where  it is overly painful or interfering with her activities as much anymore.  Patient rates her average interference with daily activities, recreation and  relations with others at a 2/10 today.  Pain increases with walking and  improves with rest.  She can walk about 10 minutes without having to stop.   REVIEW OF SYSTEMS:  Patient reports some lability in her sugars.  Otherwise,  review of systems items are negative.   SOCIAL HISTORY:  Patient's family issues are stable.   PHYSICAL EXAMINATION:  GENERAL APPEARANCE:  Patient is pleasant, in no acute  distress.  VITAL SIGNS:  Blood pressure 126/61, pulse 83, respiratory rate 16, she is  saturating 100% on room air.  LUNGS:  Clear.  CARDIOVASCULAR:  Regular rate and rhythm.  ABDOMEN:  Soft and nontender.  NEUROLOGIC:  Patient is alert and oriented x3.  Reflexes are decreased on  the right, a little bit more so than the left lower extremities.  Coordination is improved.  Strength is near 4+ to 5/5 on the right lower  extremity, 5/5 on the left.  Sensation is still 1+/2. She has minimal  allodynia today.  There is good color and temperature of skin.  Pulses are  2+.  Patient walks with minimal antalgia to the right.  She walked with and  without her cane and had good weight shift and balance overall today.    ASSESSMENT:  1.  Status post L4-5 and L5 discectomy and laminectomy with history of      significant spondylosis and lumbar radiculopathy.  2.  End-stage renal disease.  3.  Diabetes.   PLAN:  1.  Will taper Neurontin off over two weeks's time.  If patient experiences      increased pain, she may resume the medication.  Will stick with the      Cymbalta of 60 mg daily as she seems to be doing well with this.  2.  Encouraged increasing her exercise tolerance.  I would like to see her      walking a mild within the next few months.  3.  I will see the patient back in three months' time.  If she is stable at      that point, will discharge her from the pain clinic.  Overall, I am very      pleased with her progress.      Ranelle Oyster, M.D.  Electronically Signed     ZTS/MedQ  D:  07/29/2005 10:18:56  T:  07/29/2005 11:39:31  Job #:  [id number]   cc:   Jeannett Senior A. Evlyn Kanner, M.D.  Fax: [phone]

## 2011-03-08 NOTE — Consult Note (Signed)
[name], [name]                  ACCOUNT NO.:  [id number]   MEDICAL RECORD NO.:  [id number]          PATIENT TYPE:  INP   LOCATION:  5735                         FACILITY:  [facility]   PHYSICIAN:  Michael L. Reynolds, M.D.DATE OF BIRTH:  1941/02/07   DATE OF CONSULTATION:  08/29/2004  DATE OF DISCHARGE:                                   CONSULTATION   REQUESTING PHYSICIAN:  Primitivo Gauze, M.D.   REASON FOR EVALUATION:  Twitching and right leg pain.   HISTORY OF PRESENT ILLNESS:  This is the initial inpatient consult visit for  this 70 year old woman with multiple medical problems including a history of  diabetes and end-stage renal disease who was admitted to the renal service  on August 27, 2004 with a few different problems.  She states that for the  last 2-1/2 weeks or so she has basically been unable to ambulate due to  progressive pain in her low back radiating into her right lower extremity.  She has been unable to walk about and is transferring to the bathroom to  wheelchair in her house.  She was seen in the emergency room for this a  couple of weeks ago and given Vicodin.  Later her nephrologist gave her  oxycodone and sent her to an orthopedist, Dr. Ethelene Hal, for evaluation.  The  patient does have known very severe lumbar stenosis at L4-5 and L5-S1.  Apparently Dr. Ethelene Hal recommended surgery.  The patient was to see someone  else in the office of [facility] about that and have another  test done possibly a CT myelogram.  However, in the few days preceding the  admission she developed a twitching which consisted of brief jerks and  occasional tremors in both upper extremities and sometimes involving the  right side of the face as well.  This seemed to worsen over the weekend  prior to admission.  She was admitted on Monday for an evaluation of this  after returning to the emergency room concerned about possibly having a  stroke.  In the hospital, her twitching  movements have improved somewhat but  she continues to complain of upper extremity weakness and difficulty holding  forks in her hand.  She does report some numbness in the hands as well.  She  continues to report a severe low back and right lower extremity pain which  is unrelenting and keeping her from being mobile.  Neurologic consultation  is requested for input about her various neurologic symptoms.   PAST MEDICAL HISTORY:  Remarkable for end-stage renal disease on dialysis  since February of 2002.  She has had a long history of diabetes which is  felt to be the cause of her renal failure.  She does have peripheral  neuropathy and gastroparesis related to the diabetes.  She has no known  history of stroke.  She says that she has had cervical spine surgery in the  past but I did not find anything about that in her records.  She did have a  parathyroidectomy in October 2001.   FAMILY/SOCIAL/REVIEW OF SYSTEMS:  Is  outlined in the admission H&P of  July 27, 2004.   MEDICATIONS:  In the hospital she is receiving colchicine, lisinopril,  Neurontin 200 mg q.h.s., Zocor, estradiol, allopurinol, levothyroxine,  Lantus and sliding scale insulin, and Epogen with her dialysis.  She is also  receiving oxycodone p.r.n.   PHYSICAL EXAMINATION:  VITAL SIGNS:  Temperature 97.5, blood pressure  131/48, pulse 80, respirations 22.  O2 saturation 93% on room air.  GENERAL:  This is an obese, healthy-appearing woman supine in a hospital bed  in some distress.  HEAD:  Cranium is normocephalic and atraumatic.  Oropharynx is benign.  NECK:  Supple without carotid bruits.  Range of motion in the neck is full.  LOW BACK:  Straight leg raising elicits radicular pain in the right lower  extremity with even 15 degrees of lifting the leg from the bed.  NEUROLOGICAL:  Mental Status:  She is awake and alert.  She is oriented to  time and place.  Recent and remote memory is intact.  Attention span,   concentration, fund of knowledge are adequate for history giving.  She has  no evidence of aphasia on examination.  Mood is euthymic and affect  appropriate.  Cranial Nerves:  Funduscopic exam reveals proliferative  changes.  Pupils are small but reactive.  Extraocular movements full without  nystagmus.  Visual fields full to confrontation.  Hearing is intact to  conversational speech. The facial sensation is intact.  Face, tongue, and  palate move normally and symmetrically.  Shoulder shrug strength is normal.  Motor Testing:  Normal bulk and tone.  Normal strength in the upper  extremities and in the left lower extremity.  The right lower extremity is  difficulty to test due to pain-related give-way.  Sensation:  She reports  diminished pinprick sensation over both hands particularly in the first  three digits.  She also reports diminished pinprick sensation over the toes  on the right foot especially over the lateral part of the foot, otherwise  intact to light touch and pinprick throughout.  There may be a little bit of  hyperpathia to pinprick of the lateral part of the right leg.  Coordination:  Rapid movements are performed adequately.  Finger-to-nose is performed  adequately.  Gait exam is deferred due to the patient's severe pain.  Reflexes diminished throughout absent at the ankles.  Toes are downgoing.   LABORATORY REVIEW:  MRI of the lumbosacral spine performed October 26th is  personally reviewed and it does in fact demonstrate severe stenosis at L4-5  and L5-S1.   IMPRESSION:  1.  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  This is almost certainly toxic related to end-      stage renal disease and probably her medications including oxycodone and      possibly Neurontin.  This should resolve itself.  This is not a sign of      cerebrovascular disease.  2.  Subjective upper extremity weakness.  She does have good strength on     examination although she does have some sensory changes.  She  says she      has had cervical spine surgery in the past and this may be related to      that but her new problems should be excluded.  3.  Severe lumbosacral stenosis with a right lower extremity radiculopathy.   RECOMMENDATIONS:  Would use pain medications judiciously.  Discontinue or  hold the Neurontin as this may exacerbate the myoclonus.  Would check an MRI  of the cervical spine.  If that is negative, she can have an outpatient EMG  nerve conduction study but would agree with orthopedic evaluation as the  only solution to her back and leg problems is going to be surgery and  unfortunately it is going to be a big surgery.   Thank you for the consultation.       MLR/MEDQ  D:  08/29/2004  T:  08/29/2004  Job:  [id number]   cc:   Dyke Maes, M.D.  [address]  [place]  [place] [address]  Fax: [phone]

## 2011-03-08 NOTE — Discharge Summary (Signed)
NAMEJAWANA, [name]                  ACCOUNT NO.:  [id number]   MEDICAL RECORD NO.:  [id number]          PATIENT TYPE:  INP   LOCATION:  5528                         FACILITY:  [facility]   PHYSICIAN:  Franklyn Lor, MD         DATE OF BIRTH:  September 29, 1941   DATE OF ADMISSION:  08/27/2004  DATE OF DISCHARGE:  09/04/2004                                 DISCHARGE SUMMARY   CONSULTATIONS:  1.  Marlowe Kays, M.D., [facility].  2.  Marolyn Hammock. Thad Ranger, M.D., [facility].   DISCHARGE DIAGNOSES:  1.  Postoperative day four, status post microdiskectomy at lumbar verterbrae-      4/5 by Dr. Fayrene Fearing Aplington for severe spinal stenosis.  2.  End-stage renal disease, hemodialysis dependent on Tuesday, Thursday,      and Saturday.  3.  Hypothyroidism.  4.  Diabetes, insulin-dependent.   DISCHARGE MEDICATIONS:  1.  Colchicine 0.6 mg p.o. q.d.  2.  Lisinopril 20 mg p.o. q.h.s.  3.  Neurontin 200 mg p.o. q.h.s.  4.  Zocor 40 mg p.o. q.h.s.  5.  Allopurinol 150 mg p.o. q.d.  6.  Synthroid 150 mcg p.o. q.d.  7.  Lantus 20 units subcutaneously q.a.m. and 10 units subcutaneously q.h.s.  8.  Nephrology p.r.n. orders please protocol sheet.  9.  Methocarbamol 500 mg p.o. q.6h. p.r.n. muscle spasm and pain.  10. Vicodin 1-2 p.o. q.4h. p.r.n. pain.   ACTIVITY:  To be directed by the rehabilitation team here at [facility].   DIET:  Renal diet, 80/90-11-22-1198 carbohydrate modified low.   WOUND CARE:  Per orthopedics for operative site.   SPECIAL INSTRUCTIONS:  Hemodialysis on Tuesday, Thursday, Saturday three and  half hours, 2 K bath, standard heparin at a rate of 400, Epogen 3000 units  IV q.h.d. with new estimated weight of 94 kg.   PROCEDURE:  1.  Partial hemilaminectomy of L4.  2.  Total laminectomy of L5.  3.  Partial laminectomy of the sacrum on the right.  4.  Lateral recess decompression.  5.  Microdiscectomy at L4/5 and L5-S1 performed by Dr. Marlowe Kays on      August 31, 2004.   HISTORY OF PRESENT ILLNESS:  [name] is a 70 year old African American  female, patient of Dr. Aram Beecham B. Eliott Nine of [employer], who  reported to the ER on August 27, 2004 with a complaint of severe right-  sided back and leg pain.  At that time, she was also complaining of  twitching and jerking motions of her face and hand and there was some  concern by the family that she was having a stroke.  She and her husband  reported at that time she did have some slurred speech for a brief period of  time on the date of admission but that had resolved.  She gave vague  endorsement of changes in visual acuity but no flashes of light and no pain  in the eyes.  She denied chest pain, shortness of breath, syncope, diarrhea,  constipation, headache, or  loss of consciousness.  She did have one episode  nonbloody emesis the day of admission.  Denied any rashes.  Complained of  numbness, tingling and pain originating in the lumbosacral region on the  right side radiating all the way down her toes.   HOSPITAL COURSE:  The patient was admitted to the renal service to continue  her Tuesday, Thursday hemodialysis but neurology was quickly consulted for  twitching and right leg and back pain.  In addition, orthopedics was  consulted for the patient's known diagnosis of spinal stenosis for which she  was being followed by Dr. Marlowe Kays as an outpatient.  Dr. Marolyn Hammock.  Reyn[facility] of [employer] stated that in his opinion the  patient's twitching was due more towards response to narcotics and possibly  Neurontin and that this phenomenon would resolve on its own.  He indicated  this was not a sign of cerebrovascular disease.   The patient's right lower extremity radiculopathy was consistent with MRI  findings and this was deferred to orthopedics.  As stated above in  procedures, Dr. Fayrene Fearing Aplington knew the patient well and agreed  to take her  to surgery on August 31, 2004.   The patient underwent surgery and did well in the postoperative period with  low grade fever with a temperature maximum of 100.5 and adequate pain  control.  The patient was able to continue with hemodialysis in the  postoperative period without difficulty.  PT and OT worked with the patient  and Dr. Erick Colace of physical and rehabilitation medicine  evaluated the patient for rehabilitation admission.  She was seen fit for  transfer and thus transfer orders to the rehabilitation unit was written on  September 04, 2004.   Ongoing issues with this patient include diabetes for which her Lantus has  been titrated back up to 20 a.m. and 10 q.h.s.  Diarrhea that has decreased  on the day or so prior to discharge but was also found to be C. difficile  negative.  Anxiety which is an ongoing issue for this patient.  Hypertension  stable.       TD/MEDQ  D:  09/04/2004  T:  09/04/2004  Job:  [id number]   cc:   Dyke Maes, M.D.  [address]  [place]  [place] [address]  Fax: [phone]   Erick Colace, M.D.  [address] [place]  [place] [address]  Fax: [phone]   [address]

## 2011-03-08 NOTE — Discharge Summary (Signed)
[name], [name]                  ACCOUNT NO.:  [id number]   MEDICAL RECORD NO.:  [id number]          PATIENT TYPE:  IPS   LOCATION:  4007                         FACILITY:  [facility]   PHYSICIAN:  Ranelle Oyster, M.D.DATE OF BIRTH:  December 09, 1940   DATE OF ADMISSION:  09/04/2004  DATE OF DISCHARGE:  09/12/2004                                 DISCHARGE SUMMARY   DISCHARGE DIAGNOSES:  1.  Lumbar stenosis with radiculopathy requiring decompression.  2.  Type 2 diabetes mellitus.  3.  End-stage renal disease.   HISTORY OF PRESENT ILLNESS:  [name] is a 70 year old female with history  of end-stage renal disease, DM type 2, low back pain x week and decreasing  ambulation secondary to severe lumbar stenosis, L4-5 and L5-S1.  She was  started on oxycodone for pain control, however, on August 27, 2004, patient  with some known right flaccid ticks and myoclonus of bilateral hands with  question of strokes.  The calcium level was noted to be elevated at 11.6 at  admission.  Neurology was consulted for input and felt the patient had  toxicity, probably medication related secondary to oxycodone and on  Neurontin.  Doubt stroke as bilateral upper extremity strength were equal on  exam, but noted to have decreased sensation secondary to prior cervical  surgery.  On August 31, 2004, the patient underwent hemilaminectomy of L4,  total laminectomy of L5 and partial laminectomy of S1 with microdiskectomy  of L4 and L5 and L5-S1 by Dr. Simonne Come.  Postoperative issues have included  problems with low-grade fever, as well as some intolerance of narcotics.  The patient was noted to have some hypoxia with activity.  Therapies  initiated and the patient was noted to be at minimal assistance for bed  mobility, minimal assistance for ambulating 60 feet and desaturations in the  70s with activity.  Rehabilitation was consulted for progressive  independence.   PAST MEDICAL HISTORY:  Significant for:  1.   End-stage renal disease.  2.  DM type 2 with neuropathy.  3.  Hypertension.  4.  Gastroparesis.  5.  Parathyroidectomy.  6.  Diabetic retinopathy treated with laser surgery.  7.  Anemia of chronic disease.  8.  Gout.  9.  Dyslipidemia.  10. Cervical decompression.  11. Cataracts.   ALLERGIES:  1.  ASPIRIN.  2.  PENICILLIN.  3.  IV DYE.  4.  PERCOCET.   FAMILY HISTORY:  Positive for prostate cancer and MI.   SOCIAL HISTORY:  The patient is married.  Lives in a one-level home with one  step at entry.  Has had limited mobility three to four weeks prior to  admission with use of wheelchair.  She does not use any tobacco.  Uses  alcohol occasionally.   HOSPITAL COURSE:  [name] was admitted to rehabilitation on September 04, 2004, for inpatient therapies to consist of PT and OT daily.  Post  admission, the patient initially had a lot of complaints regarding pain  control, as well as spasticity.  Judicious use of narcotics was undertaken  secondary  to side the patient being admitted with side effects due to  narcotics.  Her back incision was noted to be healing well without any signs  or symptoms of infection.  Robaxin was added for spasticity, as well as low-  dose fentanyl patch was started at 12.5 mcg and increased to 25 mcg/hr  during her stay.  Lidoderm patch, a total of three, was also used to help  with the spasticity and neuropathy of the left lower extremity with Norco  one q.4-6h. p.r.n. increased pain.  Once the patient was mobilized and pain  management maximized, she was able to progress along well.  Followup x-rays  of the back were done on September 11, 2004, showing no fracture or  subluxation and mild L4-5 disk space narrowing.  During her stay in  rehabilitation, [name] progressed to being at modified independent level  for upper body care, minimal assistance to modified independent for lower  body care.  She was at supervision for transfers and supervision  for  ambulating 150 feet with an assistive device.  Husband to provide assistance  as needed post discharge.  Further followup PT and OT to continue by  [facility] post discharge.  Weight at the time of discharge was 88  kg.  The last laboratories on September 12, 2004, revealed hemoglobin 11.2,  hematocrit 33.8, white count 6.1, platelets 311, sodium 137, potassium 4.4,  chloride 97, CO2 28, BUN 31, creatinine 7.9 and glucose 133.  The patient is  continue with followup hemodialysis on an outpatient basis.   DISPOSITION:  On September 12, 2004, the patient was discharged to home.   DISCHARGE MEDICATIONS:  1.  Fentanyl patch 25 mcg and change every three days.  2.  Lidoderm patch.  Use up to three patches to right lower extremity, on 12      hours and off 12 hours.  3.  Norco 10/325 mg q.4-6h. p.r.n. pain.  4.  Ultram 25 mg b.i.d.  5.  Allopurinol 150 mg a day.  6.  __________ 10 mg q.h.s.  7.  Colchicine 0.6 mg every other day.  8.  Neurontin 100 mg in a.m. and 200 mg in p.m.  9.  Lipitor 200 mg q.h.s.  10. Lantus insulin 35 units in a.m. and 35 units in p.m.  11. Robaxin 500 mg three to four times a day p.r.n.   ACTIVITY:  Routine back precautions.   DIET:  __________ mL fluid restriction.   WOUND CARE:  Keep the area clean and dry.   SPECIAL INSTRUCTIONS:  No alcohol.  No smoking.  No driving.   FOLLOWUP:  Follow up with Dr. Simonne Come for an appointment in two to three  weeks.  Follow up with renal service for hemodialysis.  Follow up with Dr.  Riley Kill on October 24, 2004.      PP/MEDQ  D:  11/13/2004  T:  11/13/2004  Job:  [id number]   cc:   Dyke Maes, M.D.  [address]  [place]  [place] [address]  Fax: [phone]   Marlowe Kays, M.D.  [address]  [place]  [place] [address]  Fax: [phone]

## 2011-03-08 NOTE — Discharge Summary (Signed)
[name], POARCH                  ACCOUNT NO.:  [id number]   MEDICAL RECORD NO.:  [id number]          PATIENT TYPE:  INP   LOCATION:  5527                         FACILITY:  [facility]   PHYSICIAN:  Tera Mater. Evlyn Kanner, M.D. DATE OF BIRTH:  [birth year]   DATE OF ADMISSION:  03/31/2007  DATE OF DISCHARGE:  04/13/2007                               DISCHARGE SUMMARY   DISCHARGE DIAGNOSES ARE AS FOLLOWS:  1. Acute altered mental status with sequelae felt secondary to severe      hypoglycemia with partial clearing by discharge.  2. Type 2 diabetes.  3. History of renal transplant with stable renal status.  4. Hypothyroidism.  5. Hypertension.  6. Gout.  7. Anemia.  8. Hyperlipidemia.  9. Secondary hyperparathyroidism.  10.Neuropathy.  11.Chronic back pain.   CONSULTATIONS:  Included:  1. CCM with Dr. Shelle Iron seeing her early on.  2. Renal also followed her during her hospitalization.   PROCEDURES:  Included ventilation for respiratory failure as well.   [name] is a 70 year old black female with longstanding type 2 diabetes  with recent renal transplant in the last year.  She presented to my  partner, Dr. __________, unresponsive on March 31, 2007 and was actually  intubated for airway protection.  Her hypertension was significantly  high at presentation and she had some pulmonary edema during all of  this.  Renal followed carefully along with [place] on her as well as CCM  team.  She initially required significant sedation.  This metabolic  encephalopathy was felt most likely in retrospect to have been due to a  low blood sugar, though we were never able to prove that.  We could  never prove a seizure.  There was no evidence of vasculitis.  The  patient was somewhat slow initially to progress.  She even as late as  13th was making very minimal eye contact.  She was extubated on what  looks like the 12th and respiratory status appeared fairly good  thereafter.  Neurology saw her in consult  on the 13th and felt that she  had toxic metabolic encephalopathy, probably due to hypoglycemia.  Fortunately, the patient did slowly regain reasonable neurologic  progress.  She had poor p.o. intake and was sleeping quite a bit.  Excessive somnolence was a major slowing factor in her discharge.  Her  diabetes is fair under this with variable p.o. intake.  Fortunately,  renal status appeared good during all of this.  She was treated with  broad-spectrum antibiotics for presumed aspiration pneumonia as part of  all of this as well.  She did have some pulmonary edema, probably just  do to volume overload during all of the significant early problems.  She  had also a problem late in the hospitalization with abdominal pain which  we stopped and checked CT scans and other things to figure out what was  going on.  There was no particular cause of problems found and this  symptom did improve.  She did have kidney stones, but these appeared to  be incidental.  There was also  gastric wall thickening, but we really  have not pursued that since her symptoms got better.  Overall, she  improved to a point that her husband wanted to take her home on April 13, 2007.  We have talked about rehab, but they decided to go directly to a  home setting.  We did support this.   LABORATORY DATA:  A transcranial Doppler:  Absent bitemporal and  paroccipital windows during this exam, some are mean flow velocity, both  thalamic and vertebral arteries.   Echo showed EF of 55%, lower limits of normal, mild dilation of the left  ventricle, mild thickness of the valves looked okay.   EKG shows significant biventricular hypertrophy and T wave lateral  changes.   A chest x-ray on June 10:  Intubation with cardiac enlargement and  pulmonary edema.  On June 10, they also thought they saw a mass at the  base of the skull which subsequently was not found to be seen.  On June  12, increase in pulmonary vascular  congestion.  July 12, also Panda tube  in the stomach.  Improving __________  was noted on the 14th.  Cardiomegaly resolved.  Pulmonary venous congestion on the 15th; there  was left lower airspace disease felt to be atelectasis.  June 16,  worsening bibasilar atelectasis.   MRI of the brain showed no acute intracranial findings and there was  nothing at the base of the skull.   CT pelvis and abdomen shows postoperative seroma or lymphocele in  anterior right pelvic wall, small amount of pelvic free fluid,  gastric  __________ thickened in the cardia  region, small bilateral pleural  effusion and atrophic native kidneys with bilateral renal stones,  probably right renal cyst.   An intact PTH was 22 with a calcium 7.8.  Initial blood gas on the  ventilator 7.439, 54 and 518.  On June 13 when extubated, pH 7.450, PCO2  of 48, PO2 of 90.  Initial white count was 7,7000, hemoglobin 12.1,  platelets 208,000.  Final check was white count of 6,400, hemoglobin  9.7, MCV 83.9, platelets 327,000.  Sed rate was 54.  INR was 0.9.  Initial chemistries:  Sodium 141, potassium 3.1, chloride 99, CO2 28,  BUN 34, creatinine 2.43, glucose 262.  Final chemistries:  Sodium 144,  potassium 4.1, chloride 106, CO2 30, BUN 14, creatinine 1.77, glucose  277.  Liver function testing remained normal throughout.  Albumin  started at 3.7 and was 2.3 at discharge.  Phosphorous was 5.9 dropping  to 3.9.  Albumin was as low as 2.6.  Lactic acid at presentation was  2.3.  CKs were 667 and 709 with negative MBs.  BNP was 265 rising as  high as 729 and 159 at discharge.  Troponin was up at 0.08, 0.09, 0.07.  Triglycerides were 227, 178, 183 and 289.  TSH was 0.271, repeat of  0.719.  Sirolimus level was 12.8.  Urinalysis was 7-10 white cells and 3-  6 reds on the 13th.  Blood cultures x4 were all negative.  Urine culture  was no growth and then one with Klebsiella pneumoniae that was  pansensitive except for  intermediate to nitrofurantoin and resistant to  ampicillin.   In summary, we have a 70 year old black female with known diabetes,  renal transplant, other problems who presented basically with an  unresponsive encephalopathy.  She required intensive care management  with ICU time and intubation for several days.  Her mental status was  slow to resolve and several other intercurrent issues such as abdominal  pain and poorly controlled blood sugars and poor p.o. intake prolonged  this hospitalization.  At the end of this, she was actually doing quite  well overall.  She was discharged home in much improved condition over  where she started.  She is to see me back in a week; she is also to see  the renal doctor in two weeks.   MEDICATIONS ON DISCHARGE:  Include:  1. Allopurinol 150 daily.  2. Lipitor 10 mg daily.  3. Calcitriol 0.5 mcg daily.  4. Tums two three times a day.  5. Vitamin D capsules 5000 units three times a day.  6. Colchicine 0.6 every other day.  7. Duragesic patch 50 mcg q.3 days.  8. Lasix 40 two daily.  9. Neurontin 100 mg one in the morning, two in the evening.  10.Lantus 32 in the morning.  11.Sliding scale with Humalog.  12.Labetalol 200 three times a day.  13.Synthroid 175 daily.  14.Magnesium oxide 400 daily,  15.CellCept 250 twice daily.  16.Prilosec OTC 20 mg daily.  17.Potassium 20 mEq daily.  18.Prednisone 10 mg daily.  19.Seroquel 25 mg at bedtime.  20.Rapamune 7 mg daily.   She is to check her sugars, call me if she has additionally problems and  keep records.  There is no pain management required at the present, no  wound management.           ______________________________  Tera Mater. Evlyn Kanner, M.D.     SAS/MEDQ  D:  06/11/2007  T:  06/11/2007  Job:  [id number]

## 2011-03-08 NOTE — Op Note (Signed)
[name], [name]                  ACCOUNT NO.:  [id number]   MEDICAL RECORD NO.:  [id number]          PATIENT TYPE:  INP   LOCATION:  5528                         FACILITY:  [facility]   PHYSICIAN:  Marlowe Kays, M.D.  DATE OF BIRTH:  [birth year]   DATE OF PROCEDURE:  08/31/2004  DATE OF DISCHARGE:                                 OPERATIVE REPORT   PREOPERATIVE DIAGNOSIS:  Spinal stenosis and herniated nucleus pulposus, L4-  5 and L5-S1, with right leg pain only.   POSTOPERATIVE DIAGNOSIS:  Spinal stenosis and herniated nucleus pulposus, L4-  5 and L5-S1, with right leg pain only.   OPERATION:  Partial hemilaminectomy, L4, total laminectomy, L5, and partial  laminectomy of the sacrum on the right, with lateral recess decompression  and microdiskectomies at L4-5 and L5-S1.   SURGEON:  Marlowe Kays, M.D.   ASSISTANT:  Kerrin Champagne, M.D.   ANESTHESIA:  General.   PATHOLOGY AND JUSTIFICATION FOR PROCEDURE:  She has had chronic back  problems with disk herniations at L4-5 and L5-S1, which have progressively  increased in size.  This has been associated with spinal stenosis at both  levels and right leg pain only.  The surgical options I felt were either  total decompressive laminectomy of both levels or what we did, and since she  was a large woman with other health issues and in order to minimize  instability and because she had only right leg pain, I elected to proceed  with the procedure performed today.   PROCEDURE:  Satisfactory general anesthesia, prophylactic antibiotics, in  knee-chest position on the Manson frame.  The back was prepped with  Duraprep and we with three spinal needles and a lateral x-ray tentatively  localized the L4-5 and L5-S1 interspaces.  We then continued draping the  back in a sterile field, Ioban employed.  A vertical midline incision.  Soft  tissue was dissected off what I thought was the spinous process of L4, L5,  and the sacrum on the right.   A second lateral x-ray was taken and Kocher  clamps attached to the spinous processes of L4 and L5, confirming the  anatomic location.  Based on this, we then placed self-retaining McCullough  retractors with double-action rongeur and removed a substantial portion of  the lamina of L5.  Then with a combination of small curettes, 2 and 3 mm  Kerrison rongeur and a bur, I progressively removed bone and ligamentum  flavum at L4 to the sacrum on the right.  She was a large lady with a deep  wound and once our dissection became more difficult, I brought in the  microscope and completed the decompression.  Working first at L5-S1, the S1  nerve root was identified and retracted and beneath it, the large disk  herniation was noted.  It was opened with a 15 knife blade and all disk  material obtainable was removed with a combination of Epstein curette,  straight and angled upbiting pituitaries.  I then was able to locate L5  nerve root.  We checked to be sure that  the S1 nerve root was well-  decompressed in the foramen.  I then located the L5 nerve root and  superiorly continued decompression until the L4-5 disk was identified and  the same procedure performed as at L5-S1 disk.  We then checked to be sure  that the L5 nerve root was thoroughly decompressed as well.  The wound was  well-irrigated with sterile saline and Gelfoam soaked in thrombin placed  over both interspaces and around the L5 and S1 nerve roots.  Self-retaining  retractors  were removed.  There was no unusual bleeding.  I then closed the fascia with  interrupted #1 Vicryl, subcutaneous tissue with a combination of #1, 0, and  2-0 Vicryl, and the skin with staples.  Betadine and Adaptic dry sterile  dressing were applied.  At the time of this dictation, she was being  awakened.  There were no known complications.       JA/MEDQ  D:  08/31/2004  T:  09/01/2004  Job:  [id number]

## 2011-03-08 NOTE — H&P (Signed)
[place]. [facility]  Patient:    [name], [name]                         MRN: [id number] Adm. Date:  [id number] Attending:  Dayle Points                         History and Physical  REASON FOR ADMISSION:  Profound hypocalcemia.  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 Milus Mallick on October 15th.  She had significant postoperative hypocalcemia secondary to a hungry bone syndrome. Her calcium stabilized prior to discharge in the low 6s and she was discharged at 6.1 on August 10, 2000.  Phosphorus was 5.  She was discharged on elemental calcium 2 g four times a day and Rocaltrol 4 mcg a day (she had been on every-other-day Rocaltrol and RenaGel prior to admission).  Other problems during that hospitalization included acute-on-chronic renal failure, with a creatinine that peaked at 6.9, necessitating holding her diuretic, and this did not fully resolve by the time of discharge.  She also developed acute gout that responded to oral colchicine.  Patient came in for lab work on Monday, the 22nd, and of note, her calcium at that time was down to 5.6, with a phosphorus of 4.9.  She returned on the 23rd for followup labs and was profoundly weak with slurred speech and inability to ambulate.  On questioning her and her husband, despite a discharge instruction sheet which was very clear and which they acknowledged had been gone over with them, she was not taking any form of calcium but, in fact, had gone back on RenaGel and was taking her preadmission dose of Rocaltrol, that is, 0.25 mcg every other day rather than 4 mcg a day.  She was subsequently admitted for treatment of hypocalcemia and profound weakness.  Her husband stated that he picked up medicines that were called into the drug store but there was no calcium  there, but he states that he was not sure why the did not increase the Rocaltrol and why they did not noticed that there was not any calcium in the medicines they picked up.  PAST MEDICAL HISTORY  1. Chronic renal failure, nearing ESRD -- baseline creatinine around 4.1     (acute-on-chronic renal failure during her prior hospitalization, with     creatinine of 6.6 on August 11, 2000).  2. Hypertension.  3. Insulin-requiring diabetes with ______.  4. Gout.  5. Hyperlipidemia.  6. History of neurogenic bladder.  7. EPO-dependent anemia.  8. Status post left radial A-V fistula creation, Mar 05, 1999.  9. Gastroparesis.  MEDICATIONS:  Discharge medicines from August 10, 2000 included:  1. Rocaltrol 4 mcg a day (she was taking 0.25 mcg every other day).  2. Calcium carbonate four tabs q.i.d. (she was not taking any and, in fact,     was taking RenaGel two t.i.d.).  3. Lipitor 10 mg a day.  4. Catapres-TTS-1 patch once a week.  5. Premarin 0.625 mg a day.  6. Neurontin 300 mg at bedtime.  7. Adalat CC 60 mg one b.i.d.  8. Synthroid 0.15 mcg a day.  9. Humulin L insulin 25 units in the morning, 10 in the evening. 10. Colchicine 0.6 mg b.i.d. 11. EPO 8000 units  a week. 12. Lasix was on hold.  PHYSICAL EXAMINATION  GENERAL:  She was profoundly weak.  Speech was slurred.  Weight was 215.2 pounds in the office, which was stable.  VITAL SIGNS:  Blood pressure 140/80 in the leg.  She had a positive ______ sign.  NECK:  Her neck incisions were clear.  LUNGS:  Grossly clear.  CARDIAC:  S1 and S2.  No S3.  No pericardial rub.  ABDOMEN:  Nontender abdomen.  EXTREMITIES:  No edema.  Her right parathyroid implant site was okay and left A-V fistula was patent.  LABORATORY DATA:  Pertinent outpatient labs from the office included a calcium of 4.8, potassium 3.9, BUN 104, creatinine 6.5 and sodium 124.  IMPRESSION  1. Hypocalcemia with profound weakness secondary to inadequate  calcium and     vitamin D replacement, post parathyroidectomy.  2. Acute-on-chronic renal failure -- it is difficult to sort out how much of     her symptom complex is hypocalcemia versus uremia, but certainly as     calcium is corrected, if she does not improve, she may need catheter     placement for hemodialysis, as her fistula appears to be inadequate at     this point.  3. Insulin-requiring diabetes with multiple complications.  4. Hypertension.  5. Peripheral neuropathy.  6. Hypothyroidism, on replacement.  7. Gout.  8. EPO-dependent anemia.  PLAN:  Will admit to a telemetry floor, initiate calcium carbonate orally, calcium gluconate IV and high-dose Calcijex IV.  Will watch symptoms carefully, as she may need hemodialysis if mental status does not improve. Will continue to hold Lasix and give IV fluids. DD:  08/13/00 TD:  08/13/00 Job: [id number]

## 2011-03-08 NOTE — Assessment & Plan Note (Signed)
DATE OF VISIT:  November 26, 2004.   MEDICAL RECORD NUMBER:  [id number].   DATE OF BIRTH:  Feb 26, 1941.   [name] is back regarding her right leg pain due to her radiculopathy  symptoms.  We titrated her Neurontin up a tad last time to 300 mg total for  the day and also started her on Cymbalta with good results.  We sent her to  physical therapy for a TENS setup as well and this has proved beneficial.  Her pain is down to 2/10 on average.  The pain is most prominent in the  right leg, particularly in the below-knee area and calf.  The pain is worse  with walking.  She is walking with her walker throughout the house.  She  performs ADLs and self-care independently.  The pain improves with TENS  therapy and medication.  She uses two to three Vicodin a day.  Her mood has  been good.  Sleep is improved.   SOCIAL HISTORY:  No new issues are noted.  Her husband remains supportive.   REVIEW OF SYSTEMS:  The patient reports weakness, reflux and high blood  sugars.  Other pertinent positives listed above.  Full review of systems as  in the health and history section of the chart.   PHYSICAL EXAMINATION:  The blood pressure is 171/67, the pulse is 88 and the  respiratory rate is 22.  She is saturating 96% on room air.  The patient  walks with an antalgic gait favoring the right side, but was able to walk  without an adaptive device today.  Mood was bright and appropriate.  She is  well kept.  Upper extremity strength is 5/5.  Left lower extremity is 4+ to  5/5.  The right lower extremity is 4-/5, more due to pain than anything  else.  She had some decreased proprioception, although the leg remained  sensitive.  Reflexes were decreased at the ankle on the right.  She had full  range of motion.  Only trace edema was noted in the right lower extremity.  The low back exam was stable.  Cognitively the patient was alert and  oriented with normal exam.  Cranial nerve exam was intact.  The heart  was  regular rate and rhythm.  The lungs were clear.  Abdomen soft and nontender.   ASSESSMENT:  1.  Status post L4-5 and L5-S1 diskectomy and laminectomy for significant      spondylosis and lumbar radiculopathy.  2.  End-stage renal disease.  3.  History of diabetes.   PLAN:  1.  Increase Neurontin to 200 mg b.i.d.  The patient was counseled that if      she should have any sedation that she should go back to her prior dose.  2.  Continue Cymbalta at 60 mg daily.  3.  Continue Vicodin for breakthrough pain.  The patient was encouraged to      decrease this if possible.  4.  I would like the patient to be aggressive with a TENS unit.  She seems      to like this and has found benefit with it.  5.  We will not refill Lidoderm patches today.  6.  Encouraged desensitization maneuvers and massage of the right leg.  I      would also like to see her increase her mobility.  7.  We discussed the fact that it may take months for these nerves to heal  and it is encouraging that she is having      some relief at this point today.  8.  I will see her back in three months' time.      ZTS/MedQ  D:  11/26/2004 12:56:23  T:  11/26/2004 13:28:20  Job #:  [id number]   cc:   Marlowe Kays, M.D.  [address]  [place]  [place] [address]  Fax: [phone]

## 2011-03-08 NOTE — Assessment & Plan Note (Signed)
MEDICAL RECORD NUMBER:  [id number].   [name] is back regarding her right leg pain due to her lumbar radiculopathy.  Pain is at a 2 to 3/10. She is currently taking the Neurontin at 200 mg  b.i.d. as well as Cymbalta 60 mg b.i.d. She still has some hypersensitivity  of the right leg but has improved. She has occasional 5/10 pain which  generally pain is in the 2 to 3 area. She states her pain level is 1 to  2/10. She describes the pain as tingling and aching. She states that pain  can interfere with general activity, relations with others, and enjoyment of  life on a mild to moderate level. She can walk about five minutes  consecutively at this point. She uses a powered scooter for longer  distances. She states her walking distance is about 75 yards. She is  awaiting a kidney transplant through [facility].   REVIEW OF SYSTEMS:  On review of systems, the patient reports numbness,  trouble walking, some lability in blood sugars. Full review of systems is in  the health and history section of the chart.   SOCIAL HISTORY:  The patient continues to live with her spouse. Her son  remains very supportive.   PHYSICAL EXAMINATION:  Blood pressure is 130/75, pulse is 84, respiratory  rate 16. She is saturating 100% on room air. The patient is pleasant and  alert and oriented x3. Weight is stable. Affect is pleasant. She continues  to walk with some favoring of the right side. She tends to rub the right  side due to some discomfort but only on occasion. She has 4 to 4+/5 strength  in the right leg. There is mild allodynia. She had decreased proprioception  still. Reflexes are decreased at the ankle and knee. Cognitively, she is  normal. Neurological exam is stable on the other extremities. Cranial nerve  exam is normal.  HEART:  Regular rate and rhythm.  LUNGS:  Clear.   ASSESSMENT:  1.  Status post L4-L5 and L5-S1 diskectomy and laminectomy with a history of      significant spondylosis  and lumbar radiculopathy.  2.  End-stage renal disease.  3.  Diabetes.   PLAN:  1.  Will continue the same Neurontin and Cymbalta at this point. She      mentioned another medicine that she stopped because she could not      tolerate it, but she appears to be using all the medications I have      prescribed.  2.  May use Vicodin for breakthrough pain.  3.  Encouraged TENS use and exercise.  4.  Manual desensitization of the right leg.  5.  Overall patient has improved a great deal at this point. I will initiate      any new therapy as of today.  6.  Will see the patient back in three months' time.       ZTS/MedQ  D:  04/29/2005 10:58:12  T:  04/29/2005 11:16:39  Job #:  [id number]

## 2011-03-08 NOTE — Assessment & Plan Note (Signed)
MEDICAL RECORD NUMBER:  [id number].   [name] is back regarding her right leg pain due to lumbar radiculopathy. She  is status post a L4-L5/L5-S1 diskectomy and laminectomy. The patient has  been doing better with her neuropathic pain. It will arrange from a 2 to  10/10 depending on when she takes her Neurontin. If she wants until dinner  after dialysis, she will have a lot of pain that day until the Neurontin is  on board. She has found the Cymbalta has been helpful as well, and she is  taking 60 mg q.d. She is using one to two Vicodin a day. She generally  sticks with one. We stopped Lidoderm patches last visit as she felt these  were not helpful. TENS unit was not overly beneficial long term, and she  stays away from this. She feels that it really numbs the pain and does not  help it. Mood has been fair. She states pain is worse in the evening  hours. She is sleeping fairly well. Pain also increases with walking,  bending, sitting, standing, and general activity. She is hypersensitive over  the anterolateral thighs. She has a lot of feedback in massage that seemed  to help to a certain extent.   REVIEW OF SYSTEMS:  The patient reports weakness, trouble walking,  occasional limb swelling. Other pertinent positives are listed above. Full  review of systems as in the health and history section of the chart.   PHYSICAL EXAMINATION:  Blood pressure is 130/65, pulse 94, respiratory 16.  She is 100% on room air. The patient is pleasant, alert and oriented x3. She  is mildly obese. Affect is bright and appropriate. She walks with a limp  favoring the right side. Tends to shuffle a bit to the right. She is stable  with the walker in hand. The right foot has improved and really is in the 4  to 4/5 range throughout. She has some weakness in the right hamstring and  ankle dorsi flexors. She continues to have decreased proprioception and  allodynia. Reflexes were decreased at the ankle on the right  side. She had  good range of motion. Only minimal edema was seen today. Low back exam was  stable. Cognitively, the patient was very appropriate. Neurological exam  otherwise was stable including cranial nerves.  HEART:  Regular rate and rhythm.  LUNGS:  Clear.   ASSESSMENT:  1.  Status post L4-L5 and L5-S1 diskectomy and laminectomy with significant      spondylosis and lumbar radiculopathy.  2.  End-stage renal disease.  3.  History of diabetes.   PLAN:  1.  Will stay with the same dose of Neurontin. As long as she is taking this      daily, this works well for her. Encouraged taking a dose of Neurontin as      soon as she leaves dialysis with a glass of milk so that she does not go      all day without this in her system.  2.  Will increase Cymbalta 60 mg b.i.d. as this was metabolized through the      liver.  3.  She may stay with Vicodin for breakthrough pain.  4.  I encouraged her to stay with the TENS unit. I think this could be a      helpful modality for her.  5.  Continue manual desensitization maneuvers to the right leg.  6.  Could consider lumbar epidural injection at some point, but I  am not      overly eager to pursue this at this point in      time. The patient seems to be gradually improving.  7.  I will see the patient back in about two months' time.      ZTS/MedQ  D:  02/27/2005 12:53:08  T:  02/27/2005 14:09:49  Job #:  [id number]   cc:   Marlowe Kays, M.D.  [address]  [place]  [place] [address]  Fax: [phone]

## 2011-03-08 NOTE — Assessment & Plan Note (Signed)
MEDICAL RECORD NUMBER:  [id number].   [name] is back regarding her dysesthetic right pain related to her lumbar  radiculopathy. We tried to taper off her Neurontin, and she had increase of  her pain once again. She really has not felt she has recovered back to her  prior baseline back in October. She is taking two Neurontin again before  dialysis and usually one later that day. She uses the same dose of Neurontin  on her nondialysis days as well. She is on Cymbalta 60 mg daily which she is  trying to taper off because she does not feel that it is helping, and she is  concerned about the cost. The patient describes her pain as tingling and  aching. It is still somewhat better with massage. Pain interferes with  general activity, relations with others, and enjoyment of life on a  significant level. Sleep is fair. She is not exercising a great bit and is  only walking about 10 minutes at a time with a cane or her walker. She  furniture walks at home.   REVIEW OF SYSTEMS:  Pertinent positives listed above. Sugars have been a bit  labile as well. See health and history section.   SOCIAL HISTORY:  The patient lives with her husband who is supportive.   PHYSICAL EXAMINATION:  VITAL SIGNS:  Blood pressure is 143/61, pulse 92,  respiratory rate 16. She is saturating 96% on room air.  GENERAL:  The patient is pleasant in no acute distress. She is mildly obese.  She is alert and oriented x3. Affect is bright and appropriate. Gait is  stable. She has good strength, 4+ to 5/5 on the right side. Elsewhere, she  is 5/5. Pinprick and proprioception are still 1/2 with mild allodynia noted.  Skin color, temperature and integrity were all good today. She has some  antalgia with her gait on the right side.   ASSESSMENT:  1.  Status post L4-L5 diskectomy and laminectomy for spondylosis and lumbar      radiculopathy.  2.  End-stage renal disease.  3.  Diabetes.   PLAN:  1.  Continue Neurontin but  increase to two tablets b.i.d. on dialysis days      as she is likely losing a lot of that first dose while she's in      dialysis. The patient, however, feels that taking those two Neurontin      helps her get through dialysis from a pain standpoint, so we will leave      them be. On her nondialysis days, we will go back to the two tablets in      the morning and one tablet at night.  2.  Will taper Cymbalta off and see how she does. I gave her samples to help      her taper off over the next week and a half's time. If she has problems      off the Cymbalta, she has a prescription that she can fill.  3.  I will see the patient back in four to six weeks' time.      [name], M.D.  Electronically Signed     ZTS/MedQ  D:  10/23/2005 12:28:17  T:  10/23/2005 14:01:34  Job #:  [id number]   cc:   [name], M.D.  Fax: [phone]

## 2011-03-08 NOTE — Discharge Summary (Signed)
[place]. [facility]  Patient:    [name], [name]                         MRN: [id number] Adm. Date:  [id number] Attending:  Dayle Points Dictator:   Amada Jupiter, [place].A.                           Discharge Summary  HOSPITAL COURSE:  Creatinine upon discharge was 4.2 with a BUN of 64, and her weight was 95.8.  A 24-hour urine was collected with an output of 1.6 L over a 24-hour period and a creatinine clearance of 15 ml/min.  The patient was clearly not uremic and, with adequate urine output, she was discharged off Lasix.  Her fistula does not look like it will be adequate for dialysis, and converting to a graft was discussed with the patient, but she wanted to take that up with Dr. Eliott Nine as an outpatient.  There was no urgency at this time since she was tolerating her level of renal failure.  Her blood pressure was not adequately controlled upon admission.  Systolics were as high as 200 or more.  Diastolics were over 100.  Procardia was increased to 60 mg b.i.d., and clonidine was started as a patch form, 0.1 mg to be placed on the skin weekly. Ultimately, calcium carbonate was being dosed at 4 g q.i.d. and Rocaltrol at 3.0 mcg daily.  This was reviewed with the patient, and she showed a clear understanding of these discharge instructions.  She will follow up in the office the following day to have her calcium rechecked.  Her parathyroidectomy wounds were healing well.  DISCHARGE MEDICATIONS:  1. Calcium carbonate 4 g q.i.d.  2. Rocaltrol 3.0 mcg q.a.m.  3. Lipitor 10 mg at supper.  4. Synthroid 0.15 mg daily.  5. Premarin 0.625 mg daily.  6. Neurontin 300 mg q.h.s.  7. Catapres-TTS-1 patch 0.1 mg patch to be placed on skin and changed weekly.  8. Lente insulin 25 units in the morning, 10 units in the evening.  9. Adalat CC 60 mg b.i.d. 10. Atenolol 50 mg daily. 11. Colchicine 0.6 mg daily. 12. EPO 6000 units subcutaneous every Monday at [facility]. 13. No Lasix for now. 14. Stop Renagel pills.  FOLLOW-UP:  In the office for a repeat calcium the following day. DD:  10/10/00 TD:  10/12/00 Job: [id number]

## 2011-03-08 NOTE — Assessment & Plan Note (Signed)
MEDICAL RECORD NUMBER:  [id number].   [name] is back after her lumbar microdiskectomy at L4-L5, partial laminectomy  at the sacrum on the right, total laminectomy at L5 with lateral recess  decompression. This surgery was performed on August 31, 2004. The patient  has had persistent L5 radiculopathy symptoms leading up to and after the  surgery. We have been careful with her medications due to problems with  sedation prior to coming to the rehabilitation service. The patient is no  longer using her fentanyl patch at this point. She is really only using the  Vicodin for pain 3 to 4 times a day. She also uses Neurontin 100 mg in the  morning, 200 mg at bedtime, and Lidoderm patches for her especially tender  areas. The patient has had some improvement in her mobility and has  progressed with PT and OT. PT was suggesting a TENS unit which I think is  reasonable. She rates her pain on average at a 4/10, but may range from 4 to  8/10, sometimes 9/10. Does seem improved comparatively to where she was on  rehab. She is frustrated slightly by the fact that medications are washed  out with her dialysis. She does try to compensate by using medications after  her hemodialysis is complete.   CURRENT MEDICATIONS:  1.  Lidocaine patch daily.  2.  Lisinopril 20 mg q.h.s.  3.  Allopurinol 300 mg half tablet 2 q.d.  4.  Vicodin 5/500 three to four tablets a day.  5.  Renal vitamin.  6.  Colchicine 0.6 mg q.d.  7.  Neurontin 300 mg total for the day.  8.  Quinine 325 mg q.d.  9.  Calcium supplement.  10. Excedrin q.6h. p.r.n.   SOCIAL HISTORY:  She is married and husband remains supportive.   REVIEW OF SYSTEMS:  The patient reports heart murmur, palpitations,  occasional shortness of breath, numbness, blurred vision, hearing loss,  reflux, dysuria, abdominal pain.   PHYSICAL EXAMINATION:  GENERAL:  Today, blood pressure is 182/48, pulse 72,  respiratory rate 19. She is saturating 97% on room air.  The patient is in a  wheelchair and did not ambulate for me today. Affect is bright and  appropriate. Appearance is well kept.  HEART:  Regular rate and rhythm.  LUNGS:  Clear.  ABDOMEN:  Soft and nontender.  EXTREMITIES:  Showed no clubbing or edema and only trace edema on the right  side. Right leg remains allodynic and hyperpathic to touch. She has fairly  preserved motor function in the right leg, I would say at least 4 to 4+/5.  Sensory exam is diminished in the L5, perhaps L4 dermatome. Reflexes are  decreased in the right leg. She had a hard time tolerating resisted  movements of the foot or knee today. Left leg is intact, 5/5 with good  sensory function and normal reflexes. Pulses are 2+ in both feet. Skin is  warm.   ASSESSMENT:  1.  Status post L4-L5, L5-S1 diskectomy and laminectomy.  2.  Lumbar radiculopathy at L5 and perhaps L4 on the right.  3.  End-stage renal disease.  4.  History of diabetes.   PLAN:  1.  Will continue to slowly titrate her medications upward. Will increase      her Neurontin to 100 mg in the morning and afternoon and 200 mg at night      time.  2.  Will start her on Cymbalta for neuropathic pain, being at 30 mg q.d.  for      1 week, then increase to 60 mg after 2 weeks.  3.  I encouraged the patient to use her Vicodin for breakthrough symptoms.      She may use up to 5 tablets a day, 5/500 dose. Encouraged her to use      this before and after dialysis as needed.  4.  The patient may continue with Lidoderm patches daily.  5.  I think the TENS unit is worth a trial.  6.  Will see the patient back in about a month's time.      Zach   ZTS/MedQ  D:  10/24/2004 12:08:57  T:  10/24/2004 12:57:55  Job #:  [id number]   cc:   Marlowe Kays, M.D.  [address]  [place]  [place] [address]  Fax: [phone]

## 2011-03-08 NOTE — Op Note (Signed)
[place]. [facility]  Patient:    [name], [name]                         MRN: [id number] Proc. Date: 10/20/00 Adm. Date:  [id number] Attending:  Dayle Points                           Operative Report  PREOPERATIVE DIAGNOSIS:  End-stage renal disease with steal, left hand.  POSTOPERATIVE DIAGNOSIS:  End-stage renal disease with steal, left hand.  PROCEDURE:  Ligation of left wrist Cimino fistula.  SURGEON:  Larina Earthly, M.D.  ASSISTANT:  Nurse.  ANESTHESIA:  MAC.  COMPLICATIONS:  None.  DISPOSITION:  To recovery room, stable.  INDICATION FOR PROCEDURE:  Patient is a 70 year old black female with progressive renal insufficiency.  She had had a left wrist Cimino placed several months ago which had poor maturation of the vein; she subsequently had a left upper arm ______  fistula placed.  This is developing nicely but she does have some tingling in her hand.  It is recommended that she undergo ligation of her wrist Cimino fistula to hopefully improve her steal symptoms; this is a mild-to-moderate steal.  PROCEDURE IN DETAIL:  The patient was taken the operating room and placed in supine position where the area of the left arm was prepped and draped in the usual sterile fashion.  Using local anesthesia, incision was made over the prior incision at the wrist at the radial-artery-to-cephalic-vein anastomosis. The cephalic vein was encircled with 2-0 silk tie and ligated just distal to the anastomosis.  Wound was irrigated with saline and hemostased with electrocautery.  Wound was closed with 3-0 Vicryl in the subcutaneous and subcuticular tissue and Benzoin and Steri-Strips were applied. DD:  10/20/00 TD:  10/20/00 Job: [id number]

## 2011-03-08 NOTE — Discharge Summary (Signed)
[place]. [facility]  Patient:    EZZIE, SENAT                         MRN: [id number] Adm. Date:  [id number] Disc. Date: 08/17/00 Attending:  Dayle Points Dictator:   Amada Jupiter, [place].A.                           Discharge Summary  ADMISSION DIAGNOSES: 1. Hypocalcemia with profound weakness secondary to inadequate exogenous    calcium. 2. Acute renal failure on chronic renal failure. 3. Insulin-dependent diabetes mellitus. 4. Hypertension. 5. Status post parathyroidectomy with autotransplantation.  DISCHARGE DIAGNOSES: 1. Hypocalcemia, corrected. 2. Profound weakness, much improved. 3. Acute renal failure on chronic renal failure, improved. 4. Insulin-dependent diabetes mellitus. 5. Hypertension. 6. Status post parathyroidectomy.  HISTORY OF PRESENT ILLNESS:  [age]-year-old black female with chronic renal failure who is nearing end-stage renal disease who is status post parathyroidectomy by Dr. Mosetta Anis on August 04, 2000, and who has severe hungry-bone syndrome and postoperative hypocalcemia.  She is readmitted now with profound hypocalcemia and symptomatic with weakness.  During her last admission her calcium stabilized in the low 6s prior to being discharged on August 10, 2000.  Her phosphorus was 5.0.  She was to be taking 2 g of elemental calcium four times a day and 4 mcg of Rocaltrol daily.  Other problems that hospitalization included acute renal failure on chronic renal failure with a creatinine rising up to 6.9.  Diuretics were held.  She also had a gout flare-up that responded to colchicine.  Outpatient laboratories on August 11, 2000, revealed a calcium of 5.6, a phosphorus of 4.9.  The patient arrived on the day of admission to have repeat laboratories, and she was profoundly weak.  On questioning, she was not taking any calcium but instead was on two Renagel three times a day and was taking her preadmission Renagel  2 three times a day and her preadmission Rocaltrol of 0.25 mcg every other day rather than 4 mcg daily.  She is now admitted for treatment of her hypocalcemia and profound weakness.  ADMISSION LABORATORY DATA:  Calcium 4.8, potassium 3.9, BUN 104, creatinine 6.5, sodium 124.  HOSPITAL COURSE:  The patient was admitted and placed on normal saline IV fluids and started on suspension calcium carbonate to the equivalent of 3 g of elemental calcium every four hours, along with 5 mcg of IV Calcijex daily. She was placed on telemetry and monitored.  She was also placed on her usual medications including Lipitor, Synthroid, Adalat, Catapres, Premarin, Neurontin, EPO, and Humulin insulin and colchicine.  Lasix was on hold.  Close monitoring of her calcium was done by serial blood draws.  She did require some IV calcium gluconate.  Her calcium quickly rose into the high 7 range. Adjusting for her low albumin, her calcium was actually in the upper 8 range. Phosphorus was dropping into the 3s.  With these laboratory values, her Rocaltrol was increased, and her calcium carbonate was decreased to avoid hypophosphatemia.  Her weakness had improved dramatically.  Additionally, her creatinine was decreasing, to a low of 4.2 at the time of discharge and a BUN DD:  10/10/00 TD:  10/12/00 Job: [id number]

## 2011-03-08 NOTE — Op Note (Signed)
[place]. [facility]  Patient:    [name], [name]                         MRN: [id number] Proc. Date: 09/22/00 Adm. Date:  [id number] Attending:  Alyson Locket                           Operative Report  PREOPERATIVE DIAGNOSIS:  Progressive renal insufficiency.  POSTOPERATIVE DIAGNOSIS:  Progressive renal insufficiency.  PROCEDURE:  Creation of left upper arm AV fistula.  SURGEON:  Larina Earthly, M.D.  ASSISTANT:  Nurse.  ANESTHESIA:  0.5% lidocaine with epinephrine local and IV sedation.  COMPLICATIONS:  None.  DISPOSITION:  To recovery room stable.  DESCRIPTION OF PROCEDURE:  The patient was taken to the operating room and placed in the supine position, where the area of the left arm was prepped and draped in the usual sterile fashion.  Using local anesthesia, incision was made over the antecubital space and carried down to isolate the cephalic vein and the brachial artery.  The patient had a patent wrist Cimino AV fistula with a well-developed vein at the antecubital space.  The patients forearm vein was quite deep and unusable for AV access.  The vein was mobilized proximally and distally and was ligated distally.  It was occluded with a vascular clamp and divided.  The vein was brought into approximation with the brachial artery, which was also of good caliber.  The artery was occluded proximally and distally and was opened with an 11 blade and extended longitudinally with Potts scissors.  The vein was sewn end-to-side to the artery with a running 6-0 Prolene suture.  Clamps were removed, and an excellent thrill was noted.  The wound was irrigated with saline, hemostasis with electrocautery.  The wound was closed with 3-0 Vicryl in the subcutaneous and subcuticular tissue, benzoin and Steri-Strips were applied. DD:  09/22/00 TD:  09/22/00 Job: [id number]

## 2011-03-08 NOTE — Op Note (Signed)
[place]  Patient:    [name], [name]                         MRN: [id number] Proc. Date: 08/04/00 Adm. Date:  [id number] Attending:  Dayle Points CC:         D[facility] Salvia. Eliott Nine, M.D.   Operative Report  PREOPERATIVE DIAGNOSIS:  Secondary hyperparathyroidism.  POSTOPERATIVE DIAGNOSIS:  Secondary hyperparathyroidism.  OPERATION:  Total parathyroidectomy and autotransplantation of parathyroid tissue through the right brachioradialis muscle.  SURGEON:  Milus Mallick, M.D.  ASSISTANT:  Abigail Miyamoto, M.D.  ANESTHESIA:  General endotracheal.  DESCRIPTION OF PROCEDURE:  Under adequate general endotracheal anesthesia, the patients neck was extended on a shoulder roll and then prepared and draped in the usual fashion.  A low collar incision was made and carried down through the platysma muscle.  Superior and inferior platysmal flaps were developed with Bovie electrocoagulation, exposing the middle cervical fascia widely. The Mahorner self-retaining retractor was inserted.  The middle cervical fascia was then incised longitudinally in the midline.  The strap muscles were divided with Bovie electrocoagulation in a superior position.  The surgical plane of the thyroid was entered.  The left side was approached first.  There was a significant middle thyroid vein that was divided over quadruple clip technique.  This gave greater mobility to the left lobe so that it could be explored very well.  The left inferior parathyroid gland was identified first.  It was in its usual position.  It was just inferior to the inferior thyroid artery.  It was dissected out of its position and its blood supply was divided over small hemoclips and it was removed from the operative field.  It was 1.5 cm in length, 0.8 cm in width, and 0.3 cm in thickness.  A portion was sent for frozen section study, which confirmed parathyroid tissue and the remainder was  saved in iced saline.  Next, the superior pole was explored and the left superior parathyroid gland was also found in its normal position.  It was closer to being normal size, but was hyperplastic.  It was 1.0 cm in length, 0.4 cm in width and 0.2 cm in thickness.  It was dissected out by blunt and sharp dissection and excised over small hemoclips.  A portion was sent for frozen section study, which confirmed parathyroid tissue and the remainder was saved in iced saline.  We then explored the left anterior, superior mediastinum and there was some thymic tissue present, but no evidence of any supernumerary parathyroid glands.  Attention was then shifted to the right side.  A middle vein was divided over hemoclips and this gave good mobility to the right lobe of the thyroid, which was retracted anteriorly and rotated medially.  The right inferior gland was found in its usual position and it was the largest of all four of her glands. It was dissected out over hemoclips and electrocoagulation.  The right recurrent laryngeal nerve was seen and protected in this dissection.  The right inferior gland was resected over small over hemoclips and it was 1.8 cm in length, 0.8 cm in width and 0.4 cm in thickness.  A portion was sent for frozen section study and the remainder was saved in iced saline.  Just adjacent to the right inferior was the right superior gland, but the right superior gland was superior to the inferior thyroid artery and about half the size.  It was 1 cm in length, 0.5 cm in width, 0.3 cm in thickness.  It was excised over small hemoclips and a portion was sent to frozen section study, which confirmed parathyroid tissue and the remainder was saved in iced saline. At this point, we had found four parathyroid glands that were hyperplastic. We checked the right superior, anterior mediastinum and there was some thymic tissue here also with a nodule that we sent for frozen section  study and turned out to be a benign lymph node.  Hemostasis was ascertained.  The straps muscles were repaired with interrupted sutures of 4-0 Vicryl.  They were approximated in the midline with the same suture.  The platysma muscle was reapproximated with interrupted sutures of 4-0 Vicryl.  The skin was approximated with the generic skin stapler 35-W and a sterile dressing was applied.  The right forearm was exposed and prepared and draped in the usual fashion for autotransplantation.  A 5 cm long incision was made overlying the right brachial radialis muscle and carried down to it.  The fascia of the brachioradialis muscle was exposed over an area approximately 5 x 5 and the skin was distracted above it with Weitlaner self-retaining retractor.  Next, the left inferior parathyroid gland was cut and a portion was cut into 12 small pieces, 2 mm in diameter.  Each of these 12 portions of parathyroid tissue was placed into a separate intramuscular pocket in the right brachioradialis muscle and each pocket was individually closed with 4-0 Prolene sutures.  Hemostasis was complete and all 12 pieces were autotransplanted.  The skin was then reapproximated with the generic skin stapler 35-W.  A sterile dressing was applied, as well as, an Ace wrap.  The patient tolerated the procedure well, left the operating room in satisfactory condition. DD:  08/04/00 TD:  08/04/00 Job: [id number]

## 2011-03-08 NOTE — H&P (Signed)
[name], [name]                  ACCOUNT NO.:  [id number]   MEDICAL RECORD NO.:  [id number]          PATIENT TYPE:  INP   LOCATION:  5735                         FACILITY:  [facility]   PHYSICIAN:  Dyke Maes, M.D.DATE OF BIRTH:  November 10, 1940   DATE OF ADMISSION:  08/27/2004  DATE OF DISCHARGE:                                HISTORY & PHYSICAL   CHIEF COMPLAINT:  Facial and hand twitching.   HISTORY OF PRESENT ILLNESS:  This is a 70 year old African-American female  patient of Aram Beecham B. Eliott Nine, M.D. of [facility], who  reports to the ER for the second time in 10 days complaining of severe right-  sided back and leg pain.  The patient has known diagnosis of sciatica and  spinal nerve root impingement, giving rise to corresponding right-sided back  and leg pain.  She was seen in the ER on August 17, 2004, at which time,  she was given Vicodin at home.  She then saw orthopedists who discontinued  the Vicodin and started her on Oxycodone.  Apparently, she has taken 27  Oxycodone in the last five days, experienced decreased appetite, increased  somnolence, and myoclonic jerks.  She states that these jerking motions have  become more severe over the last two days with her eyes twitching and the  right side of her face twitching and also bilateral hand myoclonic jerks.  She states that these episodes come and go, but they have been getting worse  over the last two days.  She and her husband report that she did experience  some slurred speech for a brief period of time today that has since  resolved.  She gives a vague endorsement of changes in visual acuity, but no  flashes of light and no pain in the eyes.   REVIEW OF SYSTEMS:  She denies chest pain, shortness of breath, syncope,  diarrhea, constipation, headache, loss of consciousness. She does admit to  one episode of nonbloody emesis the day of admission.  She denies any new  rashes.  She admits to the above  described back and leg pain.   PAST MEDICAL HISTORY:  Significant for:  1.  Diabetes type 2, insulin-dependent.  2.  Hemodialysis-dependent end-stage renal disease.  The patient gets      hemodialysis at [facility] on Tuesday/Thursday/Saturday.  3.  Status post parathyroidectomy.  4.  Status post multiple access procedures.  5.  Hypothyroidism.  6.  Hyperlipidemia.  7.  Hypertension.  8.  Gout.  9.  Status post tubal ligation.  10. Status post laser eye surgery for diabetic retinopathy.   MEDICATIONS:  1.  Colchicine 0.6 daily.  2.  Allopurinol 150 mg p.o. daily.  3.  Estradiol 1 mg p.o. daily.  4.  Neurontin 200 mg p.o. q.h.s.  5.  Lantus 20 units subcu daily and 15 units subcu q.h.s.  6.  Synthroid 150 mcg p.o. daily.  7.  Lisinopril 20 mg p.o. q.h.s.  8.  Zocor 40 mg p.o. q.h.s.  9.  Quinine sulfate 325 mg p.o. b.i.d. p.r.n.  10. Oxycodone 5  mg p.o. q.6h p.r.n. pain.   FAMILY HISTORY:  Father with prostate cancer, brother status post MI.  No  history of CVA in the family.   SOCIAL HISTORY:  The patient quit smoking 20 years ago.  She admits to  occasional alcohol use, but no other drugs.   ALLERGIES:  PENICILLIN, ASPIRIN, ACETAMINOPHEN, PERCOCET.   PHYSICAL EXAMINATION:  VITAL SIGNS:  Temperature 99.1, pulse 85,  respirations 24, blood pressure 205/86, O2 saturation 94% on room air.  Weight 210 pounds.  GENERAL:  This is an obese, histrionic, African-American female in no acute  distress who is quite alert and oriented and very interactive.  HEENT:  EOMI; PERRL; MMM; no facial droop or facial asymmetry noted.  Nares  patent and clear.  Oropharynx clear with symmetric palatal elevation.  Positive dentures.  Bilateral bruits on carotid auscultation.  No JVD.  No  lymphadenopathy.  CHEST:  Regular rate and rhythm.  Normal S1 and S2.  No ectopy.  Questionable 1-2/6 grade systolic ejection murmur heard best at the left  lower sternal border.  Lungs were clear to  auscultation bilaterally.  The  patient has dramatic tenderness to palpation over the right lumbosacral area  that proceeds all the way down to her toes.  ABDOMEN:  Soft, nontender, obese, but nondistended with hypoactive bowel  sounds.  EXTREMITIES:  Extreme tenderness to palpation of the right lower extremity  from hip to toe, although, the patient does demonstrate 5/5 strength in all  compartments.  MUSCULOSKELETAL:  Likewise notable for 5/5 musculoskeletal strength in the  upper extremities bilaterally and in the left lower extremity.  The patient  does demonstrate some myoclonic jerking during the examination, but is  inconsistent with this presentation.  She has positive bruit and thrill in  the left arm consistent with her AV fistula.  1+ radial pulses bilaterally.  Trace to 1+ pedal pulses bilaterally.  No lower extremity edema.  Normal  capillary refill.  NEUROLOGY:  Cranial nerves III-XII grossly intact. No focal neuro deficits  with the exception of the hypersensitivity to touch in the right lower  extremity.   LABORATORY DATA:  Sodium 137, potassium 5.1, chloride 95, bicarb 30, BUN 59,  creatinine 9.5, glucose 185, phosphate 3.2, albumin 3.9, total protein 7.9,  calcium 11.6.  White blood cell count 6.9, hemoglobin 13.4, platelets 245.   MRI performed on October 26, showed moderate central disk protrusion at  level of L4-5, L5-S1 with severe stenosis at L4-5.   ASSESSMENT:  1.  Myoclonic twitching.  Obvious concern would be for stroke and while the      patient does have risk factors that include high blood pressure,      hyperlipidemia, diabetes, her physical examination is not consistent      with a stroke at this time.  Because she reports an allergy to aspirin,      that will not be started at this time, but the use of Plavix should be      considered in this individual.  Other possibilities include electrolyte     imbalance, but the patient's electrolytes look pretty  good with the      exception of calcium of 11.6 which would not be enough to cause the kind      of myoclonic jerks that this patient is experiencing.  The history of      excessive Oxycodone use in the last five days combined with the      patient's histrionic personality  probably explains the fact that the      patient experienced her decreased appetite and increased somnolence and      now this novel response of myoclonic jerks.  In any event, she has no      residual sequelae at this time, and I do not feel that it warrants a      neurologic consult.  We will continue to follow closely.  2.  Sciatica consistent with recent MRI findings and we will provide pain      relief in the form of Oxycodone five p.o. q.4h and consult orthopedics      to see if the patient is a surgical candidate.  3.  Insulin-dependent diabetes mellitus.  CBG 163 at the time of admission.      Maintain the patient's home insulin regimen and place her on a renal      diet, carbohydrate modified, and maintain her hemodialysis schedule      Tuesday/Thursday/Saturday.  4.  End-stage renal disease.  Hemodialysis Tuesday/Thursday/Saturday.  Will      contact the outpatient hemodialysis facility tomorrow and get her      hemodialysis prescription.  5.  Hypertension.  Continue Lisinopril 20 mg p.o. q.h.s.  6.  Hyperlipidemia.  Continue Lipitor 20 mg p.o. daily.  7.  Gout.  Continue allopurinol 150 mg p.o. daily and Colchicine 0.6 mg p.o.      daily.  8.  Hypothyroidism.  Continue Synthroid 0.15 mg p.o. daily.       TD/MEDQ  D:  08/27/2004  T:  08/28/2004  Job:  [id number]   cc:   Jeannett Senior A. Evlyn Kanner, M.D.  [address]  [place]  [place] [address]  Fax: [phone]   [facility] Salvia. Eliott Nine, M.D.  [address]  [place]  [place] [address]  Fax: [phone]

## 2011-03-08 NOTE — Assessment & Plan Note (Signed)
HISTORY OF PRESENT ILLNESS:  [name] is back regarding her dysesthetic right  lower extremity pain as related to her lumbar radiculopathy and surgery.  She still has some numbness and pain in the right leg, although, it is much  better controlled in general.  We adjusted her Neurontin to increase two  tablets b.i.d. on dialysis days, and one tablet on non-dialysis days of the  100 mg dose.  Additionally, she attempted to stop the Cymbalta, but realized  when she came off the medication, this was actually helping her a great bit.  So, she has resumed the medication at 60 mg daily.  The patient rates her  pain as a 4-5/10.  She describes it as constant, stabbing.  The pain and  numbness is worse somewhat when she walks.  It seems to start from her foot  and go up towards the waist.  She uses a walker when she goes longer  distances and when she leaves the house.  She uses no device or a cane at  home.  She states that she can walk about 18 minutes without having to stop.   REVIEW OF SYSTEMS:  The patient reports pertinent positive as above.  Sugars  have been a bit high at times.  She did have some sort of ocular hemorrhage  that Dr. Luciana Axe made recommendations for.  She is wearing an eye patch  currently and seems to be resolving.  Other pertinent positive listed above,  and full review is in the Health and History section.   SOCIAL HISTORY:  The patient is married and lives with her husband  currently.   PHYSICAL EXAMINATION:  VITAL SIGNS:  Blood pressure 124/89, pulse 88,  respirations 16, saturating 100% on room air.  GENERAL APPEARANCE:  The patient is pleasant in no acute distress.  She is  alert and oriented x3.  Affect is bright and appropriate.  Gait slightly in  balance to the right but is much more stable.  She had excellent balance  with the walker.  She even walked without a device today and showed good  weight transfer, although, she favored the right side a bit.  Strength is  4-  4+/5 on the right, 5/5 on the left.  Reflexes are 0-trace.  Pinprick and  proprioception are still 1/2 on the right side with minimal allodynia today.  Skin, temperature and color were all within normal limits.   ASSESSMENT:  1.  Status post L4-L5 discectomy and laminectomy for spondylosis and lumbar      radiculopathy.  2.  End stage renal disease.  3.  Diabetes.   PLAN:  1.  The patient is pretty stable at this point with her medications.  She      will continue on Cymbalta 60 mg daily and Neurontin 100 mg two tablets      b.i.d. on dialysis days, two tablets in the morning and one tablet at      night on non-dialysis days.  Will maintain Cymbalta at 60 mg daily.  2.  Recommend ongoing exercise and endurance training.  I think she can      reach the point where she does not need her walker any further.  Still      probably the safest bet for her to use this on dialysis days, but      otherwise, would like to see her using a cane.  She has a good home      exercise program through the  fitness focus program at [facility].  3.  I will see her back as needed in the future.  She will follow up with      Dr. Adrian Prince for her general medical needs.      Ranelle Oyster, M.D.  Electronically Signed     ZTS/MedQ  D:  12/18/2005 10:41:06  T:  12/18/2005 11:41:42  Job #:  [id number]   cc:   Jeannett Senior A. Evlyn Kanner, M.D.  Fax: [phone]

## 2011-03-09 LAB — SIROLIMUS LEVEL: Sirolimus (Rapamycin): 5.5 ng/mL

## 2011-03-10 LAB — URINE CULTURE

## 2011-04-03 ENCOUNTER — Other Ambulatory Visit: Payer: Self-pay | Admitting: Nephrology

## 2011-04-03 ENCOUNTER — Encounter (HOSPITAL_COMMUNITY): Payer: Medicare Other | Attending: Nephrology

## 2011-04-03 DIAGNOSIS — E119 Type 2 diabetes mellitus without complications: Secondary | ICD-10-CM | POA: Insufficient documentation

## 2011-04-03 DIAGNOSIS — N2581 Secondary hyperparathyroidism of renal origin: Secondary | ICD-10-CM | POA: Insufficient documentation

## 2011-04-03 DIAGNOSIS — D638 Anemia in other chronic diseases classified elsewhere: Secondary | ICD-10-CM | POA: Insufficient documentation

## 2011-04-03 DIAGNOSIS — N183 Chronic kidney disease, stage 3 unspecified: Secondary | ICD-10-CM | POA: Insufficient documentation

## 2011-04-03 LAB — TSH: TSH: 1.404 u[IU]/mL (ref 0.350–4.500)

## 2011-04-03 LAB — URINALYSIS, ROUTINE W REFLEX MICROSCOPIC
Glucose, UA: NEGATIVE mg/dL
Ketones, ur: NEGATIVE mg/dL
Nitrite: NEGATIVE
Specific Gravity, Urine: 1.014 (ref 1.005–1.030)
pH: 5.5 (ref 5.0–8.0)

## 2011-04-03 LAB — COMPREHENSIVE METABOLIC PANEL
AST: 17 U/L (ref 0–37)
Albumin: 3.7 g/dL (ref 3.5–5.2)
BUN: 27 mg/dL — ABNORMAL HIGH (ref 6–23)
Calcium: 9.4 mg/dL (ref 8.4–10.5)
Creatinine, Ser: 1.97 mg/dL — ABNORMAL HIGH (ref 0.4–1.2)
Total Bilirubin: 0.5 mg/dL (ref 0.3–1.2)

## 2011-04-03 LAB — URINE MICROSCOPIC-ADD ON

## 2011-04-03 LAB — POCT HEMOGLOBIN-HEMACUE: Hemoglobin: 13 g/dL (ref 12.0–15.0)

## 2011-04-03 LAB — HEMOGLOBIN A1C: Hgb A1c MFr Bld: 8.3 % — ABNORMAL HIGH (ref <5.7)

## 2011-04-03 LAB — URIC ACID: Uric Acid, Serum: 4.4 mg/dL (ref 2.4–7.0)

## 2011-04-03 LAB — PHOSPHORUS: Phosphorus: 4.2 mg/dL (ref 2.3–4.6)

## 2011-04-04 LAB — URINE CULTURE

## 2011-04-05 LAB — SIROLIMUS LEVEL: Sirolimus (Rapamycin): 7.5 ng/mL

## 2011-04-17 ENCOUNTER — Encounter (HOSPITAL_COMMUNITY): Payer: No Typology Code available for payment source

## 2011-05-03 ENCOUNTER — Other Ambulatory Visit: Payer: Self-pay | Admitting: Nephrology

## 2011-05-03 ENCOUNTER — Encounter (HOSPITAL_COMMUNITY): Payer: Medicare Other | Attending: Nephrology

## 2011-05-03 DIAGNOSIS — E119 Type 2 diabetes mellitus without complications: Secondary | ICD-10-CM | POA: Insufficient documentation

## 2011-05-03 DIAGNOSIS — D638 Anemia in other chronic diseases classified elsewhere: Secondary | ICD-10-CM | POA: Insufficient documentation

## 2011-05-03 DIAGNOSIS — N2581 Secondary hyperparathyroidism of renal origin: Secondary | ICD-10-CM | POA: Insufficient documentation

## 2011-05-03 DIAGNOSIS — N183 Chronic kidney disease, stage 3 unspecified: Secondary | ICD-10-CM | POA: Insufficient documentation

## 2011-05-03 LAB — URINE MICROSCOPIC-ADD ON

## 2011-05-03 LAB — COMPREHENSIVE METABOLIC PANEL
CO2: 34 mEq/L — ABNORMAL HIGH (ref 19–32)
Calcium: 8.8 mg/dL (ref 8.4–10.5)
Creatinine, Ser: 1.82 mg/dL — ABNORMAL HIGH (ref 0.50–1.10)
GFR calc Af Amer: 33 mL/min — ABNORMAL LOW (ref >60)
GFR calc non Af Amer: 27 mL/min — ABNORMAL LOW (ref >60)
Glucose, Bld: 217 mg/dL — ABNORMAL HIGH (ref 70–99)
Total Protein: 6.4 g/dL (ref 6.0–8.3)

## 2011-05-03 LAB — PHOSPHORUS: Phosphorus: 4.1 mg/dL (ref 2.3–4.6)

## 2011-05-03 LAB — URINALYSIS, ROUTINE W REFLEX MICROSCOPIC
Glucose, UA: 100 mg/dL — AB
Ketones, ur: 15 mg/dL — AB
Nitrite: NEGATIVE
Protein, ur: 100 mg/dL — AB
pH: 5.5 (ref 5.0–8.0)

## 2011-05-03 LAB — POCT HEMOGLOBIN-HEMACUE: Hemoglobin: 11.5 g/dL — ABNORMAL LOW (ref 12.0–15.0)

## 2011-05-06 LAB — URINE CULTURE
Colony Count: 75000
Culture  Setup Time: 201207131117

## 2011-05-11 ENCOUNTER — Emergency Department (HOSPITAL_COMMUNITY): Payer: Medicare Other

## 2011-05-11 ENCOUNTER — Emergency Department (HOSPITAL_COMMUNITY)
Admission: EM | Admit: 2011-05-11 | Discharge: 2011-05-11 | Disposition: A | Discharge status: Home / Self Care | Payer: Medicare Other | Attending: Emergency Medicine | Admitting: Emergency Medicine

## 2011-05-11 DIAGNOSIS — I509 Heart failure, unspecified: Secondary | ICD-10-CM | POA: Insufficient documentation

## 2011-05-11 DIAGNOSIS — I2510 Atherosclerotic heart disease of native coronary artery without angina pectoris: Secondary | ICD-10-CM | POA: Insufficient documentation

## 2011-05-11 DIAGNOSIS — E039 Hypothyroidism, unspecified: Secondary | ICD-10-CM | POA: Insufficient documentation

## 2011-05-11 DIAGNOSIS — G319 Degenerative disease of nervous system, unspecified: Secondary | ICD-10-CM | POA: Insufficient documentation

## 2011-05-11 DIAGNOSIS — I10 Essential (primary) hypertension: Secondary | ICD-10-CM | POA: Insufficient documentation

## 2011-05-11 DIAGNOSIS — Z794 Long term (current) use of insulin: Secondary | ICD-10-CM | POA: Insufficient documentation

## 2011-05-11 DIAGNOSIS — N390 Urinary tract infection, site not specified: Secondary | ICD-10-CM | POA: Insufficient documentation

## 2011-05-11 DIAGNOSIS — R5381 Other malaise: Secondary | ICD-10-CM | POA: Insufficient documentation

## 2011-05-11 DIAGNOSIS — K219 Gastro-esophageal reflux disease without esophagitis: Secondary | ICD-10-CM | POA: Insufficient documentation

## 2011-05-11 DIAGNOSIS — E785 Hyperlipidemia, unspecified: Secondary | ICD-10-CM | POA: Insufficient documentation

## 2011-05-11 DIAGNOSIS — E119 Type 2 diabetes mellitus without complications: Secondary | ICD-10-CM | POA: Insufficient documentation

## 2011-05-11 DIAGNOSIS — F29 Unspecified psychosis not due to a substance or known physiological condition: Secondary | ICD-10-CM | POA: Insufficient documentation

## 2011-05-13 LAB — CBC
HCT: 35.4 % — ABNORMAL LOW (ref 36.0–46.0)
Hemoglobin: 11.1 g/dL — ABNORMAL LOW (ref 12.0–15.0)
MCHC: 31.4 g/dL (ref 30.0–36.0)
MCV: 90.3 fL (ref 78.0–100.0)
RDW: 19.5 % — ABNORMAL HIGH (ref 11.5–15.5)

## 2011-05-13 LAB — URINALYSIS, ROUTINE W REFLEX MICROSCOPIC
Bilirubin Urine: NEGATIVE
Ketones, ur: NEGATIVE mg/dL
Nitrite: NEGATIVE
Specific Gravity, Urine: 1.015 (ref 1.005–1.030)
pH: 7 (ref 5.0–8.0)

## 2011-05-13 LAB — URINE MICROSCOPIC-ADD ON

## 2011-05-13 LAB — GLUCOSE, CAPILLARY: Glucose-Capillary: 167 mg/dL — ABNORMAL HIGH (ref 70–99)

## 2011-05-13 LAB — URINE CULTURE: Culture  Setup Time: 201207212031

## 2011-05-13 LAB — COMPREHENSIVE METABOLIC PANEL
Albumin: 3.3 g/dL — ABNORMAL LOW (ref 3.5–5.2)
Alkaline Phosphatase: 59 U/L (ref 39–117)
BUN: 20 mg/dL (ref 6–23)
Creatinine, Ser: 1.9 mg/dL — ABNORMAL HIGH (ref 0.50–1.10)
GFR calc Af Amer: 31 mL/min — ABNORMAL LOW (ref >60)
Glucose, Bld: 192 mg/dL — ABNORMAL HIGH (ref 70–99)
Total Bilirubin: 0.5 mg/dL (ref 0.3–1.2)
Total Protein: 7 g/dL (ref 6.0–8.3)

## 2011-05-13 LAB — CK TOTAL AND CKMB (NOT AT ARMC)
CK, MB: 3.5 ng/mL (ref 0.3–4.0)
Total CK: 906 U/L — ABNORMAL HIGH (ref 7–177)

## 2011-05-13 LAB — DIFFERENTIAL
Basophils Absolute: 0 10*3/uL (ref 0.0–0.1)
Basophils Relative: 0 % (ref 0–1)
Eosinophils Absolute: 0.1 10*3/uL (ref 0.0–0.7)
Eosinophils Relative: 1 % (ref 0–5)
Monocytes Absolute: 0.4 10*3/uL (ref 0.1–1.0)
Monocytes Relative: 5 % (ref 3–12)

## 2011-05-13 LAB — TROPONIN I: Troponin I: <0.3 ng/mL (ref <0.30)

## 2011-05-31 ENCOUNTER — Other Ambulatory Visit: Payer: Self-pay | Admitting: Nephrology

## 2011-05-31 ENCOUNTER — Encounter (HOSPITAL_COMMUNITY): Payer: Medicare Other | Attending: Nephrology

## 2011-05-31 DIAGNOSIS — N183 Chronic kidney disease, stage 3 unspecified: Secondary | ICD-10-CM | POA: Insufficient documentation

## 2011-05-31 DIAGNOSIS — R6889 Other general symptoms and signs: Secondary | ICD-10-CM | POA: Insufficient documentation

## 2011-05-31 DIAGNOSIS — N2581 Secondary hyperparathyroidism of renal origin: Secondary | ICD-10-CM | POA: Insufficient documentation

## 2011-05-31 DIAGNOSIS — D638 Anemia in other chronic diseases classified elsewhere: Secondary | ICD-10-CM | POA: Insufficient documentation

## 2011-05-31 DIAGNOSIS — E119 Type 2 diabetes mellitus without complications: Secondary | ICD-10-CM | POA: Insufficient documentation

## 2011-05-31 LAB — COMPREHENSIVE METABOLIC PANEL
Albumin: 3.4 g/dL — ABNORMAL LOW (ref 3.5–5.2)
Alkaline Phosphatase: 65 U/L (ref 39–117)
BUN: 30 mg/dL — ABNORMAL HIGH (ref 6–23)
CO2: 34 mEq/L — ABNORMAL HIGH (ref 19–32)
Chloride: 99 mEq/L (ref 96–112)
Creatinine, Ser: 1.88 mg/dL — ABNORMAL HIGH (ref 0.50–1.10)
GFR calc Af Amer: 32 mL/min — ABNORMAL LOW (ref >60)
GFR calc non Af Amer: 26 mL/min — ABNORMAL LOW (ref >60)
Glucose, Bld: 258 mg/dL — ABNORMAL HIGH (ref 70–99)
Potassium: 3.6 mEq/L (ref 3.5–5.1)
Total Bilirubin: 0.5 mg/dL (ref 0.3–1.2)

## 2011-05-31 LAB — URINE MICROSCOPIC-ADD ON

## 2011-05-31 LAB — URINALYSIS, ROUTINE W REFLEX MICROSCOPIC
Bilirubin Urine: NEGATIVE
Ketones, ur: NEGATIVE mg/dL
Nitrite: NEGATIVE
Protein, ur: 100 mg/dL — AB
pH: 6 (ref 5.0–8.0)

## 2011-05-31 LAB — PHOSPHORUS: Phosphorus: 4.4 mg/dL (ref 2.3–4.6)

## 2011-06-03 LAB — URINE CULTURE
Colony Count: >=100000
Culture  Setup Time: 201208101113

## 2011-06-04 LAB — SIROLIMUS LEVEL: Sirolimus (Rapamycin): 9 ng/mL

## 2011-06-28 ENCOUNTER — Other Ambulatory Visit: Payer: Self-pay | Admitting: Nephrology

## 2011-06-28 ENCOUNTER — Encounter (HOSPITAL_COMMUNITY): Payer: Medicare Other | Attending: Nephrology

## 2011-06-28 DIAGNOSIS — N2581 Secondary hyperparathyroidism of renal origin: Secondary | ICD-10-CM | POA: Insufficient documentation

## 2011-06-28 DIAGNOSIS — D638 Anemia in other chronic diseases classified elsewhere: Secondary | ICD-10-CM | POA: Insufficient documentation

## 2011-06-28 DIAGNOSIS — R6889 Other general symptoms and signs: Secondary | ICD-10-CM | POA: Insufficient documentation

## 2011-06-28 DIAGNOSIS — E119 Type 2 diabetes mellitus without complications: Secondary | ICD-10-CM | POA: Insufficient documentation

## 2011-06-28 DIAGNOSIS — N183 Chronic kidney disease, stage 3 unspecified: Secondary | ICD-10-CM | POA: Insufficient documentation

## 2011-06-28 LAB — COMPREHENSIVE METABOLIC PANEL
ALT: 8 U/L (ref 0–35)
Alkaline Phosphatase: 59 U/L (ref 39–117)
BUN: 29 mg/dL — ABNORMAL HIGH (ref 6–23)
CO2: 33 mEq/L — ABNORMAL HIGH (ref 19–32)
Calcium: 9.7 mg/dL (ref 8.4–10.5)
GFR calc Af Amer: 32 mL/min — ABNORMAL LOW (ref >60)
GFR calc non Af Amer: 26 mL/min — ABNORMAL LOW (ref >60)
Glucose, Bld: 271 mg/dL — ABNORMAL HIGH (ref 70–99)
Potassium: 3.4 mEq/L — ABNORMAL LOW (ref 3.5–5.1)
Sodium: 144 mEq/L (ref 135–145)

## 2011-06-28 LAB — URINALYSIS, ROUTINE W REFLEX MICROSCOPIC
Bilirubin Urine: NEGATIVE
Glucose, UA: 250 mg/dL — AB
Ketones, ur: NEGATIVE mg/dL
Protein, ur: 30 mg/dL — AB

## 2011-06-28 LAB — HEMOGLOBIN A1C
Hgb A1c MFr Bld: 8.9 % — ABNORMAL HIGH (ref <5.7)
Mean Plasma Glucose: 209 mg/dL — ABNORMAL HIGH (ref <117)

## 2011-06-28 LAB — TSH: TSH: 0.824 u[IU]/mL (ref 0.350–4.500)

## 2011-06-28 LAB — URINE MICROSCOPIC-ADD ON

## 2011-06-30 LAB — URINE CULTURE

## 2011-07-02 LAB — SIROLIMUS LEVEL: Sirolimus (Rapamycin): 8.4 ng/mL

## 2011-07-11 LAB — URINE MICROSCOPIC-ADD ON

## 2011-07-11 LAB — LIPID PANEL
LDL Cholesterol: 92
Triglycerides: 149
VLDL: 30

## 2011-07-11 LAB — URINALYSIS, ROUTINE W REFLEX MICROSCOPIC
Glucose, UA: 100 — AB
Protein, ur: 100 — AB
Specific Gravity, Urine: 1.022
Urobilinogen, UA: 0.2

## 2011-07-11 LAB — CBC
HCT: 35.6 — ABNORMAL LOW
Hemoglobin: 11.6 — ABNORMAL LOW
Platelets: 174
RBC: 4.34
WBC: 2.6 — ABNORMAL LOW

## 2011-07-11 LAB — COMPREHENSIVE METABOLIC PANEL
Albumin: 3.8
Alkaline Phosphatase: 54
BUN: 20
Chloride: 101
Potassium: 3.6
Total Bilirubin: 1

## 2011-07-11 LAB — HEMOGLOBIN A1C
Hgb A1c MFr Bld: 7.7 — ABNORMAL HIGH
Mean Plasma Glucose: 197

## 2011-07-11 LAB — IRON AND TIBC: TIBC: 232 — ABNORMAL LOW

## 2011-07-11 LAB — PTH, INTACT AND CALCIUM: Calcium, Total (PTH): 9.2

## 2011-07-12 LAB — COMPREHENSIVE METABOLIC PANEL
Albumin: 3.6
BUN: 27 — ABNORMAL HIGH
Creatinine, Ser: 2.09 — ABNORMAL HIGH
GFR calc Af Amer: 28 — ABNORMAL LOW
Total Protein: 7.2

## 2011-07-12 LAB — PHOSPHORUS: Phosphorus: 4.4

## 2011-07-12 LAB — URINE MICROSCOPIC-ADD ON

## 2011-07-12 LAB — URINALYSIS, ROUTINE W REFLEX MICROSCOPIC
Bilirubin Urine: NEGATIVE
Ketones, ur: NEGATIVE
Protein, ur: 30 — AB
Urobilinogen, UA: 0.2

## 2011-07-12 LAB — IRON AND TIBC
Saturation Ratios: 21
TIBC: 234 — ABNORMAL LOW
UIBC: 185

## 2011-07-12 LAB — FERRITIN: Ferritin: 988 — ABNORMAL HIGH (ref 10–291)

## 2011-07-12 LAB — URINE CULTURE

## 2011-07-12 LAB — CBC
HCT: 34.8 — ABNORMAL LOW
MCV: 81.2
Platelets: 293
RDW: 18.7 — ABNORMAL HIGH

## 2011-07-15 LAB — URINALYSIS, ROUTINE W REFLEX MICROSCOPIC
Bilirubin Urine: NEGATIVE
Glucose, UA: NEGATIVE
Ketones, ur: NEGATIVE
Protein, ur: 100 — AB

## 2011-07-15 LAB — CBC
Hemoglobin: 10.2 — ABNORMAL LOW
MCHC: 32.4
RDW: 18.8 — ABNORMAL HIGH

## 2011-07-15 LAB — IRON AND TIBC
Iron: 51
Saturation Ratios: 23
TIBC: 221 — ABNORMAL LOW
UIBC: 170

## 2011-07-15 LAB — COMPREHENSIVE METABOLIC PANEL
ALT: 15
BUN: 20
Calcium: 8.8
Glucose, Bld: 92
Sodium: 143
Total Protein: 6.9

## 2011-07-15 LAB — FERRITIN: Ferritin: 853 — ABNORMAL HIGH (ref 10–291)

## 2011-07-15 LAB — URINE MICROSCOPIC-ADD ON

## 2011-07-16 LAB — CBC
Hemoglobin: 10.9 — ABNORMAL LOW
RBC: 4.01

## 2011-07-16 LAB — COMPREHENSIVE METABOLIC PANEL
ALT: 18
AST: 24
Alkaline Phosphatase: 47
CO2: 30
GFR calc non Af Amer: 27 — ABNORMAL LOW
Glucose, Bld: 84
Potassium: 3.4 — ABNORMAL LOW
Sodium: 146 — ABNORMAL HIGH
Total Protein: 6.6

## 2011-07-16 LAB — URINALYSIS, ROUTINE W REFLEX MICROSCOPIC
Bilirubin Urine: NEGATIVE
Ketones, ur: NEGATIVE
Nitrite: NEGATIVE
pH: 5.5

## 2011-07-16 LAB — POCT I-STAT 4, (NA,K, GLUC, HGB,HCT)
Hemoglobin: 12.9
Operator id: 181601
Potassium: 3.6
Sodium: 142

## 2011-07-16 LAB — PTH, INTACT AND CALCIUM: PTH: 27.6

## 2011-07-16 LAB — HEMOGLOBIN A1C
Hgb A1c MFr Bld: 7.6 — ABNORMAL HIGH
Mean Plasma Glucose: 193

## 2011-07-16 LAB — IRON AND TIBC
Saturation Ratios: 28
UIBC: 165

## 2011-07-16 LAB — LIPID PANEL
HDL: 49
VLDL: 28

## 2011-07-16 LAB — TSH: TSH: 2.786

## 2011-07-16 LAB — URINE MICROSCOPIC-ADD ON

## 2011-07-16 LAB — SIROLIMUS LEVEL: Sirolimus (Rapamycin): 11.7 (ref 3.0–18.0)

## 2011-07-16 LAB — PHOSPHORUS: Phosphorus: 3.9

## 2011-07-18 LAB — CBC
HCT: 34.9 — ABNORMAL LOW
HCT: 35.5 — ABNORMAL LOW
Hemoglobin: 11.7 — ABNORMAL LOW
Hemoglobin: 11.6 — ABNORMAL LOW
MCHC: 33.5
MCV: 81.6
Platelets: 168
Platelets: 168
RDW: 18.2 — ABNORMAL HIGH
RDW: 18.1 — ABNORMAL HIGH
WBC: 3.1 — ABNORMAL LOW

## 2011-07-18 LAB — URINALYSIS, ROUTINE W REFLEX MICROSCOPIC
Bilirubin Urine: NEGATIVE
Glucose, UA: 100 — AB
Protein, ur: 100 — AB
Urobilinogen, UA: 0.2

## 2011-07-18 LAB — IRON AND TIBC
Iron: 83
Saturation Ratios: 32
Saturation Ratios: 34
TIBC: 257
TIBC: 233 — ABNORMAL LOW
UIBC: 174
UIBC: 153

## 2011-07-18 LAB — COMPREHENSIVE METABOLIC PANEL
Albumin: 3.9
BUN: 25 — ABNORMAL HIGH
Calcium: 9.2
Creatinine, Ser: 2.01 — ABNORMAL HIGH
Glucose, Bld: 104 — ABNORMAL HIGH
Potassium: 3.7
Total Protein: 7

## 2011-07-18 LAB — PHOSPHORUS: Phosphorus: 5 — ABNORMAL HIGH

## 2011-07-18 LAB — URINE MICROSCOPIC-ADD ON

## 2011-07-18 LAB — FERRITIN: Ferritin: 1040 — ABNORMAL HIGH (ref 10–291)

## 2011-07-19 LAB — URINE MICROSCOPIC-ADD ON

## 2011-07-19 LAB — CBC
MCHC: 32.5
MCV: 84.1
Platelets: 157

## 2011-07-19 LAB — COMPREHENSIVE METABOLIC PANEL
ALT: 21
AST: 25
Albumin: 3.7
Calcium: 9
Creatinine, Ser: 2.04 — ABNORMAL HIGH
GFR calc Af Amer: 29 — ABNORMAL LOW
Sodium: 143
Total Protein: 6.4

## 2011-07-19 LAB — URINALYSIS, ROUTINE W REFLEX MICROSCOPIC
Glucose, UA: NEGATIVE
Ketones, ur: NEGATIVE
Protein, ur: 30 — AB

## 2011-07-19 LAB — LIPID PANEL
Cholesterol: 160
Total CHOL/HDL Ratio: 2.8
VLDL: 22

## 2011-07-19 LAB — PTH, INTACT AND CALCIUM
Calcium, Total (PTH): 9.3
PTH: 19.5

## 2011-07-19 LAB — FERRITIN: Ferritin: 862 — ABNORMAL HIGH (ref 10–291)

## 2011-07-19 LAB — PHOSPHORUS: Phosphorus: 4.3

## 2011-07-19 LAB — IRON AND TIBC
Saturation Ratios: 35
UIBC: 151

## 2011-07-22 LAB — URINALYSIS, ROUTINE W REFLEX MICROSCOPIC
Nitrite: NEGATIVE
Specific Gravity, Urine: 1.027
Urobilinogen, UA: 0.2

## 2011-07-22 LAB — CBC
HCT: 38.2
Hemoglobin: 12.7
Platelets: 157
WBC: 4.1

## 2011-07-22 LAB — COMPREHENSIVE METABOLIC PANEL
ALT: 35
Albumin: 3.7
Alkaline Phosphatase: 39
BUN: 28 — ABNORMAL HIGH
Chloride: 104
Glucose, Bld: 141 — ABNORMAL HIGH
Potassium: 3.8
Total Bilirubin: 1.3 — ABNORMAL HIGH

## 2011-07-22 LAB — URINE MICROSCOPIC-ADD ON

## 2011-07-22 LAB — PHOSPHORUS: Phosphorus: 3.9

## 2011-07-22 LAB — IRON AND TIBC: TIBC: 249 — ABNORMAL LOW

## 2011-07-23 LAB — CBC
HCT: 38
MCHC: 32.9
MCV: 86
Platelets: 169
RDW: 18.6 — ABNORMAL HIGH
WBC: 4.5

## 2011-07-23 LAB — COMPREHENSIVE METABOLIC PANEL
AST: 56 — ABNORMAL HIGH
Albumin: 4
Alkaline Phosphatase: 55 U/L (ref 39–117)
BUN: 31 — ABNORMAL HIGH
BUN: 23 mg/dL (ref 6–23)
CO2: 29 mEq/L (ref 19–32)
Calcium: 9.3
Chloride: 107 mEq/L (ref 96–112)
Creatinine, Ser: 2.24 — ABNORMAL HIGH
Creatinine, Ser: 1.73 mg/dL — ABNORMAL HIGH (ref 0.4–1.2)
GFR calc Af Amer: 26 — ABNORMAL LOW
GFR calc non Af Amer: 29 mL/min — ABNORMAL LOW (ref >60)
Total Bilirubin: 0.9 mg/dL (ref 0.3–1.2)
Total Protein: 6.8

## 2011-07-23 LAB — IRON AND TIBC
Saturation Ratios: 22
Saturation Ratios: 35 % (ref 20–55)
TIBC: 242 — ABNORMAL LOW
TIBC: 234 ug/dL — ABNORMAL LOW (ref 250–470)
UIBC: 189

## 2011-07-23 LAB — URINALYSIS, ROUTINE W REFLEX MICROSCOPIC
Bilirubin Urine: NEGATIVE
Ketones, ur: NEGATIVE
Nitrite: NEGATIVE
Nitrite: NEGATIVE
Specific Gravity, Urine: 1.026
Specific Gravity, Urine: 1.021 (ref 1.005–1.030)
Urobilinogen, UA: 0.2
pH: 5.5
pH: 6 (ref 5.0–8.0)

## 2011-07-23 LAB — URINE MICROSCOPIC-ADD ON

## 2011-07-23 LAB — SIROLIMUS LEVEL
Sirolimus (Rapamycin): 11.2 (ref 3.0–18.0)
Sirolimus (Rapamycin): 8.1 (ref 3.0–18.0)

## 2011-07-23 LAB — PHOSPHORUS: Phosphorus: 3.9

## 2011-07-23 LAB — POCT HEMOGLOBIN-HEMACUE: Hemoglobin: 10.9 g/dL — ABNORMAL LOW (ref 12.0–15.0)

## 2011-07-26 ENCOUNTER — Encounter (HOSPITAL_COMMUNITY)
Admission: RE | Admit: 2011-07-26 | Discharge: 2011-07-26 | Disposition: A | Discharge status: Home / Self Care | Payer: Medicare Other | Source: Ambulatory Visit | Attending: Nephrology | Admitting: Nephrology

## 2011-07-26 ENCOUNTER — Other Ambulatory Visit: Payer: Self-pay

## 2011-07-26 DIAGNOSIS — N2581 Secondary hyperparathyroidism of renal origin: Secondary | ICD-10-CM | POA: Insufficient documentation

## 2011-07-26 DIAGNOSIS — N183 Chronic kidney disease, stage 3 unspecified: Secondary | ICD-10-CM | POA: Insufficient documentation

## 2011-07-26 DIAGNOSIS — D638 Anemia in other chronic diseases classified elsewhere: Secondary | ICD-10-CM | POA: Insufficient documentation

## 2011-07-26 LAB — COMPREHENSIVE METABOLIC PANEL
ALT: 13 U/L (ref 0–35)
AST: 17 U/L (ref 0–37)
Albumin: 3.6 g/dL (ref 3.5–5.2)
Alkaline Phosphatase: 51 U/L (ref 39–117)
Calcium: 8.8 mg/dL (ref 8.4–10.5)
GFR calc Af Amer: 28 mL/min — ABNORMAL LOW (ref >90)
Glucose, Bld: 155 mg/dL — ABNORMAL HIGH (ref 70–99)
Potassium: 3.7 mEq/L (ref 3.5–5.1)
Sodium: 143 mEq/L (ref 135–145)
Total Protein: 6.9 g/dL (ref 6.0–8.3)

## 2011-07-26 LAB — CBC
HCT: 30.1 — ABNORMAL LOW
HCT: 32.4 — ABNORMAL LOW
Hemoglobin: 10.7 — ABNORMAL LOW
MCHC: 33
MCHC: 33.7
MCV: 80.3
RBC: 3.75 — ABNORMAL LOW
RBC: 3.96
RDW: 18.1 — ABNORMAL HIGH
WBC: 4.5

## 2011-07-26 LAB — URINALYSIS, ROUTINE W REFLEX MICROSCOPIC
Bilirubin Urine: NEGATIVE
Ketones, ur: NEGATIVE mg/dL
Nitrite: NEGATIVE
Specific Gravity, Urine: 1.019 (ref 1.005–1.030)
pH: 5.5 (ref 5.0–8.0)

## 2011-07-26 LAB — RENAL FUNCTION PANEL
BUN: 29 — ABNORMAL HIGH
CO2: 28
Calcium: 8.4
Glucose, Bld: 270 — ABNORMAL HIGH
Phosphorus: 4.7 — ABNORMAL HIGH
Sodium: 139

## 2011-07-26 LAB — BASIC METABOLIC PANEL
BUN: 26 — ABNORMAL HIGH
CO2: 28
Chloride: 103
GFR calc Af Amer: 23 — ABNORMAL LOW
Potassium: 4.3

## 2011-07-26 LAB — GLUCOSE, RANDOM: Glucose, Bld: 309 — ABNORMAL HIGH

## 2011-07-26 LAB — URINE MICROSCOPIC-ADD ON

## 2011-07-26 LAB — PHOSPHORUS: Phosphorus: 3.9 mg/dL (ref 2.3–4.6)

## 2011-07-26 LAB — POCT HEMOGLOBIN-HEMACUE: Hemoglobin: 11.5 g/dL — ABNORMAL LOW (ref 12.0–15.0)

## 2011-07-27 LAB — URINE CULTURE: Colony Count: 45000

## 2011-07-29 LAB — BASIC METABOLIC PANEL
BUN: 32 — ABNORMAL HIGH
BUN: 36 — ABNORMAL HIGH
CO2: 29
CO2: 30
Chloride: 102
Creatinine, Ser: 2.89 — ABNORMAL HIGH
Glucose, Bld: 70
Potassium: 3.9
Potassium: 4
Sodium: 142

## 2011-07-29 LAB — SIROLIMUS LEVEL: Sirolimus (Rapamycin): 12.2 (ref 3.0–18.0)

## 2011-07-29 LAB — CBC
HCT: 31.3 — ABNORMAL LOW
HCT: 31.5 — ABNORMAL LOW
HCT: 34.3 — ABNORMAL LOW
Hemoglobin: 10.3 — ABNORMAL LOW
Hemoglobin: 11.1 — ABNORMAL LOW
MCHC: 32.6
MCHC: 32.8
MCHC: 33.4
MCV: 80.8
MCV: 82.2
Platelets: 177
Platelets: 184
Platelets: 195
RBC: 3.81 — ABNORMAL LOW
RBC: 4.14
RDW: 18.4 — ABNORMAL HIGH
RDW: 18.7 — ABNORMAL HIGH
WBC: 10.1
WBC: 10.3
WBC: 3.6 — ABNORMAL LOW

## 2011-07-29 LAB — DIFFERENTIAL
Basophils Absolute: 0
Basophils Relative: 0
Eosinophils Absolute: 0 — ABNORMAL LOW
Eosinophils Relative: 0
Lymphs Abs: 0.1 — ABNORMAL LOW
Neutrophils Relative %: 93 — ABNORMAL HIGH

## 2011-07-29 LAB — COMPREHENSIVE METABOLIC PANEL
ALT: 20
AST: 26
Alkaline Phosphatase: 50
CO2: 32
Chloride: 99
GFR calc Af Amer: 23 — ABNORMAL LOW
GFR calc non Af Amer: 19 — ABNORMAL LOW
Potassium: 3.9
Sodium: 142
Total Bilirubin: 0.9

## 2011-07-29 LAB — URINALYSIS, ROUTINE W REFLEX MICROSCOPIC
Bilirubin Urine: NEGATIVE
Glucose, UA: NEGATIVE
Ketones, ur: NEGATIVE
Ketones, ur: NEGATIVE
Nitrite: NEGATIVE
Protein, ur: 100 — AB
Urobilinogen, UA: 0.2
Urobilinogen, UA: 0.2
pH: 6

## 2011-07-29 LAB — I-STAT 8, (EC8 V) (CONVERTED LAB)
Acid-Base Excess: 6 — ABNORMAL HIGH
Chloride: 104
HCT: 37
Operator id: 192351
TCO2: 33
pCO2, Ven: 49.4
pH, Ven: 7.419 — ABNORMAL HIGH

## 2011-07-29 LAB — TSH: TSH: 2.57

## 2011-07-29 LAB — IRON AND TIBC: UIBC: 180

## 2011-07-29 LAB — POCT I-STAT CREATININE
Creatinine, Ser: 2.9 — ABNORMAL HIGH
Operator id: 192351

## 2011-07-29 LAB — URINE MICROSCOPIC-ADD ON

## 2011-07-29 LAB — PHOSPHORUS: Phosphorus: 4.8 — ABNORMAL HIGH

## 2011-07-29 LAB — HEMOGLOBIN A1C: Mean Plasma Glucose: 197

## 2011-07-29 LAB — LIPID PANEL
Cholesterol: 174
HDL: 50
LDL Cholesterol: 98

## 2011-07-30 LAB — URINE MICROSCOPIC-ADD ON

## 2011-07-30 LAB — CBC
MCHC: 32.4
MCV: 81.3
Platelets: 167
RDW: 19.1 — ABNORMAL HIGH

## 2011-07-30 LAB — COMPREHENSIVE METABOLIC PANEL
AST: 32
Albumin: 3.7
Calcium: 9.2
Creatinine, Ser: 2.16 — ABNORMAL HIGH
GFR calc Af Amer: 27 — ABNORMAL LOW
GFR calc non Af Amer: 23 — ABNORMAL LOW
Total Protein: 6.7

## 2011-07-30 LAB — URINALYSIS, ROUTINE W REFLEX MICROSCOPIC
Bilirubin Urine: NEGATIVE
Specific Gravity, Urine: 1.019
Urobilinogen, UA: 0.2

## 2011-07-30 LAB — IRON AND TIBC
Saturation Ratios: 29
UIBC: 182

## 2011-07-30 LAB — PHOSPHORUS: Phosphorus: 4.7 — ABNORMAL HIGH

## 2011-07-30 LAB — FERRITIN: Ferritin: 1258 — ABNORMAL HIGH (ref 10–291)

## 2011-08-01 LAB — IRON AND TIBC
Saturation Ratios: 27
TIBC: 240 — ABNORMAL LOW

## 2011-08-01 LAB — COMPREHENSIVE METABOLIC PANEL
Alkaline Phosphatase: 74
BUN: 26 — ABNORMAL HIGH
Chloride: 99
GFR calc non Af Amer: 19 — ABNORMAL LOW
Glucose, Bld: 191 — ABNORMAL HIGH
Potassium: 4.1
Total Bilirubin: 0.7

## 2011-08-01 LAB — CBC
HCT: 33.5 — ABNORMAL LOW
Hemoglobin: 10.9 — ABNORMAL LOW
WBC: 5.9

## 2011-08-01 LAB — LIPID PANEL
HDL: 38 — ABNORMAL LOW
Triglycerides: 218 — ABNORMAL HIGH
VLDL: 44 — ABNORMAL HIGH

## 2011-08-01 LAB — MAGNESIUM: Magnesium: 2.3

## 2011-08-01 LAB — URINALYSIS, ROUTINE W REFLEX MICROSCOPIC
Specific Gravity, Urine: 1.024
Urobilinogen, UA: 0.2

## 2011-08-01 LAB — TSH: TSH: 2.607

## 2011-08-01 LAB — HEMOGLOBIN A1C: Mean Plasma Glucose: 247

## 2011-08-01 LAB — DIFFERENTIAL
Basophils Absolute: 0.1
Basophils Relative: 2 — ABNORMAL HIGH
Neutro Abs: 4.7
Neutrophils Relative %: 80 — ABNORMAL HIGH

## 2011-08-01 LAB — URINE MICROSCOPIC-ADD ON

## 2011-08-07 LAB — COMPREHENSIVE METABOLIC PANEL
ALT: 28
AST: 34
Albumin: 2.3 — ABNORMAL LOW
Alkaline Phosphatase: 56
BUN: 27 — ABNORMAL HIGH
BUN: 35 — ABNORMAL HIGH
BUN: 37 — ABNORMAL HIGH
CO2: 31
CO2: 32
CO2: 33 — ABNORMAL HIGH
Calcium: 6.9 — ABNORMAL LOW
Calcium: 7.2 — ABNORMAL LOW
Chloride: 106
Chloride: 113 — ABNORMAL HIGH
Creatinine, Ser: 1.64 — ABNORMAL HIGH
Creatinine, Ser: 1.93 — ABNORMAL HIGH
Creatinine, Ser: 1.95 — ABNORMAL HIGH
Creatinine, Ser: 2.06 — ABNORMAL HIGH
GFR calc non Af Amer: 24 — ABNORMAL LOW
GFR calc non Af Amer: 26 — ABNORMAL LOW
GFR calc non Af Amer: 26 — ABNORMAL LOW
Glucose, Bld: 146 — ABNORMAL HIGH
Glucose, Bld: 80
Potassium: 3.3 — ABNORMAL LOW
Sodium: 145
Total Bilirubin: 0.8
Total Bilirubin: 0.9
Total Protein: 5.7 — ABNORMAL LOW
Total Protein: 6.5

## 2011-08-07 LAB — RENAL FUNCTION PANEL
Albumin: 2.4 — ABNORMAL LOW
BUN: 18
CO2: 30
Calcium: 6.6 — ABNORMAL LOW
Calcium: 7.2 — ABNORMAL LOW
Chloride: 105
Creatinine, Ser: 1.56 — ABNORMAL HIGH
Creatinine, Ser: 1.65 — ABNORMAL HIGH
Glucose, Bld: 229 — ABNORMAL HIGH
Phosphorus: 3.5
Potassium: 3.9

## 2011-08-07 LAB — BASIC METABOLIC PANEL
BUN: 15
BUN: 24 — ABNORMAL HIGH
CO2: 29
CO2: 30
Calcium: 7 — ABNORMAL LOW
Calcium: 7.3 — ABNORMAL LOW
Chloride: 106
Creatinine, Ser: 1.57 — ABNORMAL HIGH
Creatinine, Ser: 1.77 — ABNORMAL HIGH
GFR calc Af Amer: 40 — ABNORMAL LOW
GFR calc non Af Amer: 33 — ABNORMAL LOW
Glucose, Bld: 164 — ABNORMAL HIGH
Glucose, Bld: 277 — ABNORMAL HIGH
Potassium: 4
Sodium: 138
Sodium: 144

## 2011-08-07 LAB — PHOSPHORUS
Phosphorus: 3.1
Phosphorus: 3.9

## 2011-08-07 LAB — CBC
HCT: 28.3 — ABNORMAL LOW
HCT: 30 — ABNORMAL LOW
HCT: 31 — ABNORMAL LOW
Hemoglobin: 10.4 — ABNORMAL LOW
Hemoglobin: 10.6 — ABNORMAL LOW
Hemoglobin: 9.5 — ABNORMAL LOW
Hemoglobin: 9.7 — ABNORMAL LOW
MCHC: 32.6
MCHC: 33
MCV: 82.9
MCV: 83.4
MCV: 83.9
MCV: 84.3
Platelets: 243
Platelets: 327
RBC: 3.68 — ABNORMAL LOW
RBC: 3.86 — ABNORMAL LOW
RBC: 3.86 — ABNORMAL LOW
RDW: 17.1 — ABNORMAL HIGH
RDW: 17.6 — ABNORMAL HIGH
RDW: 17.8 — ABNORMAL HIGH
RDW: 17.9 — ABNORMAL HIGH
WBC: 7.2
WBC: 9.6

## 2011-08-07 LAB — DIFFERENTIAL
Basophils Absolute: 0
Basophils Relative: 0
Eosinophils Absolute: 0.1
Monocytes Absolute: 0.9 — ABNORMAL HIGH
Neutro Abs: 3.5

## 2011-08-07 LAB — MAGNESIUM
Magnesium: 2.2
Magnesium: 2.5
Magnesium: 2.5

## 2011-08-07 LAB — HEMOGLOBIN AND HEMATOCRIT, BLOOD: Hemoglobin: 8.8 — ABNORMAL LOW

## 2011-08-07 LAB — B-NATRIURETIC PEPTIDE (CONVERTED LAB)
Pro B Natriuretic peptide (BNP): 241 — ABNORMAL HIGH
Pro B Natriuretic peptide (BNP): 290 — ABNORMAL HIGH
Pro B Natriuretic peptide (BNP): 394 — ABNORMAL HIGH
Pro B Natriuretic peptide (BNP): 729 — ABNORMAL HIGH

## 2011-08-07 LAB — SIROLIMUS LEVEL: Sirolimus (Rapamycin): 12.8 (ref 3.0–18.0)

## 2011-08-08 LAB — TROPONIN I
Troponin I: 0.06
Troponin I: 0.08 — ABNORMAL HIGH
Troponin I: 0.09 — ABNORMAL HIGH

## 2011-08-08 LAB — I-STAT 8, (EC8 V) (CONVERTED LAB)
Acid-Base Excess: 6 — ABNORMAL HIGH
Chloride: 106
HCT: 39
Hemoglobin: 13.3
Operator id: 146091
Potassium: 3.9
Sodium: 143
TCO2: 34

## 2011-08-08 LAB — POCT I-STAT 3, ART BLOOD GAS (G3+)
O2 Saturation: 100
O2 Saturation: 89
O2 Saturation: 94
O2 Saturation: 96
O2 Saturation: 99
Operator id: 270161
Operator id: 270161
Operator id: 288141
Patient temperature: 103
Patient temperature: 104
Patient temperature: 98.7
Patient temperature: 98.7
TCO2: 30
TCO2: 35
TCO2: 36
TCO2: 38
pCO2 arterial: 27.6 — ABNORMAL LOW
pCO2 arterial: 34.2 — ABNORMAL LOW
pCO2 arterial: 46.8 — ABNORMAL HIGH
pCO2 arterial: 48.2 — ABNORMAL HIGH
pCO2 arterial: 54.3 — ABNORMAL HIGH
pH, Arterial: 7.392
pH, Arterial: 7.439 — ABNORMAL HIGH
pH, Arterial: 7.623
pH, Arterial: 7.679
pO2, Arterial: 54 — ABNORMAL LOW

## 2011-08-08 LAB — COMPREHENSIVE METABOLIC PANEL
ALT: 22
Albumin: 2.9 — ABNORMAL LOW
BUN: 34 — ABNORMAL HIGH
BUN: 37 — ABNORMAL HIGH
CO2: 28
Calcium: 7.6 — ABNORMAL LOW
Calcium: 8.1 — ABNORMAL LOW
Creatinine, Ser: 1.97 — ABNORMAL HIGH
Creatinine, Ser: 2.43 — ABNORMAL HIGH
GFR calc non Af Amer: 20 — ABNORMAL LOW
Glucose, Bld: 262 — ABNORMAL HIGH
Potassium: 3.5
Total Protein: 6.7

## 2011-08-08 LAB — CBC
HCT: 32.2 — ABNORMAL LOW
HCT: 33.4 — ABNORMAL LOW
HCT: 34.6 — ABNORMAL LOW
HCT: 36.1
Hemoglobin: 10.6 — ABNORMAL LOW
Hemoglobin: 12.1
MCHC: 32.2
MCHC: 32.5
MCHC: 33
MCHC: 33.5
MCV: 82.9
MCV: 84.1
MCV: 84.1
MCV: 85.6
Platelets: 161
Platelets: 161
Platelets: 162
Platelets: 163
RBC: 4.32
RDW: 17.5 — ABNORMAL HIGH
RDW: 17.7 — ABNORMAL HIGH
RDW: 17.9 — ABNORMAL HIGH
RDW: 18.1 — ABNORMAL HIGH
RDW: 18.2 — ABNORMAL HIGH
WBC: 13.2 — ABNORMAL HIGH
WBC: 7.7

## 2011-08-08 LAB — URINE CULTURE
Colony Count: NO GROWTH
Culture: NO GROWTH
Special Requests: NEGATIVE

## 2011-08-08 LAB — LACTIC ACID, PLASMA: Lactic Acid, Venous: 2.3 — ABNORMAL HIGH

## 2011-08-08 LAB — CK
Total CK: 640 — ABNORMAL HIGH
Total CK: 701 — ABNORMAL HIGH

## 2011-08-08 LAB — URINALYSIS, ROUTINE W REFLEX MICROSCOPIC
Bilirubin Urine: NEGATIVE
Nitrite: NEGATIVE
Nitrite: POSITIVE — AB
Protein, ur: 30 — AB
Specific Gravity, Urine: 1.009 (ref 1.005–1.035)
Specific Gravity, Urine: 1.013
Urobilinogen, UA: 0.2
pH: 5.5

## 2011-08-08 LAB — RENAL FUNCTION PANEL
Albumin: 2.9 — ABNORMAL LOW
BUN: 33 — ABNORMAL HIGH
BUN: 35 — ABNORMAL HIGH
CO2: 28
CO2: 31
Calcium: 7.7 — ABNORMAL LOW
Chloride: 103
Creatinine, Ser: 1.86 — ABNORMAL HIGH
Creatinine, Ser: 2.38 — ABNORMAL HIGH
Glucose, Bld: 124 — ABNORMAL HIGH
Glucose, Bld: 173 — ABNORMAL HIGH
Phosphorus: 3.7
Phosphorus: 5.7 — ABNORMAL HIGH
Phosphorus: 5.9 — ABNORMAL HIGH
Potassium: 3.9
Sodium: 143

## 2011-08-08 LAB — PHOSPHORUS: Phosphorus: 3.2

## 2011-08-08 LAB — CULTURE, BLOOD (ROUTINE X 2)
Culture: NO GROWTH
Culture: NO GROWTH

## 2011-08-08 LAB — CARDIAC PANEL(CRET KIN+CKTOT+MB+TROPI)
Relative Index: 0.5
Total CK: 585 — ABNORMAL HIGH
Troponin I: 0.07 — ABNORMAL HIGH

## 2011-08-08 LAB — URINE MICROSCOPIC-ADD ON

## 2011-08-08 LAB — PROTIME-INR: INR: 0.9

## 2011-08-08 LAB — HEPATIC FUNCTION PANEL
Albumin: 3.7
Bilirubin, Direct: 0.2
Total Bilirubin: 1.4 — ABNORMAL HIGH

## 2011-08-08 LAB — POCT CARDIAC MARKERS
Operator id: 146091
Troponin i, poc: <0.05

## 2011-08-08 LAB — TSH: TSH: 0.271 — ABNORMAL LOW

## 2011-08-08 LAB — CK TOTAL AND CKMB (NOT AT ARMC)
CK, MB: 3.7
Total CK: 667 — ABNORMAL HIGH

## 2011-08-08 LAB — B-NATRIURETIC PEPTIDE (CONVERTED LAB)
Pro B Natriuretic peptide (BNP): 265 — ABNORMAL HIGH
Pro B Natriuretic peptide (BNP): 415 — ABNORMAL HIGH
Pro B Natriuretic peptide (BNP): 548 — ABNORMAL HIGH
Pro B Natriuretic peptide (BNP): 71

## 2011-08-08 LAB — PTH, INTACT AND CALCIUM: PTH: 21.8

## 2011-08-08 LAB — SODIUM, URINE, RANDOM: Sodium, Ur: <10

## 2011-08-08 LAB — POCT I-STAT CREATININE
Creatinine, Ser: 2.1 — ABNORMAL HIGH
Operator id: 146091

## 2011-08-23 ENCOUNTER — Other Ambulatory Visit (HOSPITAL_COMMUNITY): Payer: Self-pay | Admitting: *Deleted

## 2011-08-28 ENCOUNTER — Encounter (HOSPITAL_COMMUNITY)
Admission: RE | Admit: 2011-08-28 | Discharge: 2011-08-28 | Disposition: A | Discharge status: Home / Self Care | Payer: Medicare Other | Source: Ambulatory Visit | Attending: Nephrology | Admitting: Nephrology

## 2011-08-28 DIAGNOSIS — D638 Anemia in other chronic diseases classified elsewhere: Secondary | ICD-10-CM | POA: Insufficient documentation

## 2011-08-28 DIAGNOSIS — N183 Chronic kidney disease, stage 3 unspecified: Secondary | ICD-10-CM | POA: Insufficient documentation

## 2011-08-28 DIAGNOSIS — E119 Type 2 diabetes mellitus without complications: Secondary | ICD-10-CM | POA: Insufficient documentation

## 2011-08-28 DIAGNOSIS — N2581 Secondary hyperparathyroidism of renal origin: Secondary | ICD-10-CM | POA: Insufficient documentation

## 2011-08-28 DIAGNOSIS — R6889 Other general symptoms and signs: Secondary | ICD-10-CM | POA: Insufficient documentation

## 2011-08-28 LAB — URINALYSIS, ROUTINE W REFLEX MICROSCOPIC
Bilirubin Urine: NEGATIVE
Nitrite: NEGATIVE
Protein, ur: 100 mg/dL — AB
Specific Gravity, Urine: 1.018 (ref 1.005–1.030)
Urobilinogen, UA: 0.2 mg/dL (ref 0.0–1.0)

## 2011-08-28 LAB — PHOSPHORUS: Phosphorus: 3.4 mg/dL (ref 2.3–4.6)

## 2011-08-28 LAB — COMPREHENSIVE METABOLIC PANEL
Albumin: 3.6 g/dL (ref 3.5–5.2)
BUN: 30 mg/dL — ABNORMAL HIGH (ref 6–23)
Creatinine, Ser: 1.84 mg/dL — ABNORMAL HIGH (ref 0.50–1.10)
GFR calc Af Amer: 30 mL/min — ABNORMAL LOW (ref >90)
Glucose, Bld: 225 mg/dL — ABNORMAL HIGH (ref 70–99)
Total Bilirubin: 0.7 mg/dL (ref 0.3–1.2)
Total Protein: 7.3 g/dL (ref 6.0–8.3)

## 2011-08-28 LAB — IRON AND TIBC
Iron: 41 ug/dL — ABNORMAL LOW (ref 42–135)
Saturation Ratios: 18 % — ABNORMAL LOW (ref 20–55)
UIBC: 186 ug/dL (ref 125–400)

## 2011-08-28 LAB — URINE MICROSCOPIC-ADD ON

## 2011-08-28 LAB — HEMOGLOBIN A1C: Hgb A1c MFr Bld: 8.5 % — ABNORMAL HIGH (ref <5.7)

## 2011-08-28 LAB — POCT HEMOGLOBIN-HEMACUE: Hemoglobin: 12 g/dL (ref 12.0–15.0)

## 2011-08-28 MED ORDER — EPOETIN ALFA 10000 UNIT/ML IJ SOLN: 7500.0000 [IU] | INTRAMUSCULAR | Status: DC

## 2011-08-31 LAB — SIROLIMUS LEVEL: Sirolimus (Rapamycin): 6 ng/mL

## 2011-09-11 ENCOUNTER — Encounter (HOSPITAL_COMMUNITY)
Admission: RE | Admit: 2011-09-11 | Discharge: 2011-09-11 | Disposition: A | Discharge status: Home / Self Care | Payer: Medicare Other | Source: Ambulatory Visit | Attending: Nephrology | Admitting: Nephrology

## 2011-09-11 MED ORDER — EPOETIN ALFA 10000 UNIT/ML IJ SOLN: 7500.0000 [IU] | INTRAMUSCULAR | Status: DC

## 2011-09-26 ENCOUNTER — Encounter (HOSPITAL_COMMUNITY)
Admission: RE | Admit: 2011-09-26 | Discharge: 2011-09-26 | Disposition: A | Discharge status: Home / Self Care | Payer: Medicare Other | Source: Ambulatory Visit | Attending: Nephrology | Admitting: Nephrology

## 2011-09-26 DIAGNOSIS — E119 Type 2 diabetes mellitus without complications: Secondary | ICD-10-CM | POA: Insufficient documentation

## 2011-09-26 DIAGNOSIS — N2581 Secondary hyperparathyroidism of renal origin: Secondary | ICD-10-CM | POA: Insufficient documentation

## 2011-09-26 DIAGNOSIS — D638 Anemia in other chronic diseases classified elsewhere: Secondary | ICD-10-CM | POA: Insufficient documentation

## 2011-09-26 DIAGNOSIS — N183 Chronic kidney disease, stage 3 unspecified: Secondary | ICD-10-CM | POA: Insufficient documentation

## 2011-09-26 DIAGNOSIS — R6889 Other general symptoms and signs: Secondary | ICD-10-CM | POA: Insufficient documentation

## 2011-09-26 LAB — URINE MICROSCOPIC-ADD ON

## 2011-09-26 LAB — URINALYSIS, ROUTINE W REFLEX MICROSCOPIC
Glucose, UA: NEGATIVE mg/dL
Protein, ur: 30 mg/dL — AB
Specific Gravity, Urine: 1.017 (ref 1.005–1.030)

## 2011-09-26 LAB — POCT HEMOGLOBIN-HEMACUE: Hemoglobin: 11.7 g/dL — ABNORMAL LOW (ref 12.0–15.0)

## 2011-09-26 LAB — COMPREHENSIVE METABOLIC PANEL
ALT: 10 U/L (ref 0–35)
AST: 18 U/L (ref 0–37)
Albumin: 3.9 g/dL (ref 3.5–5.2)
Alkaline Phosphatase: 54 U/L (ref 39–117)
Calcium: 9.2 mg/dL (ref 8.4–10.5)
GFR calc Af Amer: 27 mL/min — ABNORMAL LOW (ref >90)
Potassium: 3.7 mEq/L (ref 3.5–5.1)
Sodium: 144 mEq/L (ref 135–145)
Total Protein: 7.2 g/dL (ref 6.0–8.3)

## 2011-09-26 LAB — FERRITIN: Ferritin: 550 ng/mL — ABNORMAL HIGH (ref 10–291)

## 2011-09-26 LAB — IRON AND TIBC
Saturation Ratios: 20 % (ref 20–55)
TIBC: 239 ug/dL — ABNORMAL LOW (ref 250–470)
UIBC: 191 ug/dL (ref 125–400)

## 2011-09-26 MED ORDER — EPOETIN ALFA 10000 UNIT/ML IJ SOLN
7500.0000 [IU] | INTRAMUSCULAR | Status: DC
  Administered 2011-09-26: 7500 [IU] via SUBCUTANEOUS

## 2011-09-26 MED ORDER — EPOETIN ALFA 10000 UNIT/ML IJ SOLN
INTRAMUSCULAR | Status: AC
  Administered 2011-09-26: 7500 [IU] via SUBCUTANEOUS
  Filled 2011-09-26: qty 1

## 2011-09-29 LAB — URINE CULTURE: Colony Count: >=100000

## 2011-10-24 ENCOUNTER — Encounter (HOSPITAL_COMMUNITY): Payer: No Typology Code available for payment source

## 2011-11-01 ENCOUNTER — Encounter (HOSPITAL_COMMUNITY)
Admission: RE | Admit: 2011-11-01 | Discharge: 2011-11-01 | Disposition: A | Discharge status: Home / Self Care | Payer: Medicare Other | Source: Ambulatory Visit | Attending: Nephrology | Admitting: Nephrology

## 2011-11-01 DIAGNOSIS — N183 Chronic kidney disease, stage 3 unspecified: Secondary | ICD-10-CM | POA: Insufficient documentation

## 2011-11-01 DIAGNOSIS — E119 Type 2 diabetes mellitus without complications: Secondary | ICD-10-CM | POA: Insufficient documentation

## 2011-11-01 DIAGNOSIS — R6889 Other general symptoms and signs: Secondary | ICD-10-CM | POA: Insufficient documentation

## 2011-11-01 DIAGNOSIS — D638 Anemia in other chronic diseases classified elsewhere: Secondary | ICD-10-CM | POA: Insufficient documentation

## 2011-11-01 DIAGNOSIS — N2581 Secondary hyperparathyroidism of renal origin: Secondary | ICD-10-CM | POA: Insufficient documentation

## 2011-11-01 LAB — COMPREHENSIVE METABOLIC PANEL
ALT: 9 U/L (ref 0–35)
Alkaline Phosphatase: 61 U/L (ref 39–117)
BUN: 30 mg/dL — ABNORMAL HIGH (ref 6–23)
CO2: 36 mEq/L — ABNORMAL HIGH (ref 19–32)
Calcium: 9.5 mg/dL (ref 8.4–10.5)
GFR calc Af Amer: 24 mL/min — ABNORMAL LOW (ref >90)
GFR calc non Af Amer: 20 mL/min — ABNORMAL LOW (ref >90)
Glucose, Bld: 161 mg/dL — ABNORMAL HIGH (ref 70–99)
Sodium: 143 mEq/L (ref 135–145)
Total Protein: 7.3 g/dL (ref 6.0–8.3)

## 2011-11-01 LAB — PHOSPHORUS: Phosphorus: 3.8 mg/dL (ref 2.3–4.6)

## 2011-11-01 LAB — IRON AND TIBC
Iron: 59 ug/dL (ref 42–135)
TIBC: 229 ug/dL — ABNORMAL LOW (ref 250–470)

## 2011-11-01 LAB — URINALYSIS, ROUTINE W REFLEX MICROSCOPIC
Glucose, UA: NEGATIVE mg/dL
Ketones, ur: NEGATIVE mg/dL
Protein, ur: NEGATIVE mg/dL
Urobilinogen, UA: 0.2 mg/dL (ref 0.0–1.0)

## 2011-11-01 LAB — URINE MICROSCOPIC-ADD ON

## 2011-11-01 LAB — FERRITIN: Ferritin: 666 ng/mL — ABNORMAL HIGH (ref 10–291)

## 2011-11-01 MED ORDER — EPOETIN ALFA 10000 UNIT/ML IJ SOLN
INTRAMUSCULAR | Status: AC
  Administered 2011-11-01: 7500 [IU] via SUBCUTANEOUS
  Filled 2011-11-01: qty 1

## 2011-11-01 MED ORDER — EPOETIN ALFA 10000 UNIT/ML IJ SOLN: 7500.0000 [IU] | INTRAMUSCULAR | Status: DC

## 2011-11-28 ENCOUNTER — Encounter (HOSPITAL_COMMUNITY)
Admission: RE | Admit: 2011-11-28 | Discharge: 2011-11-28 | Disposition: A | Discharge status: Home / Self Care | Payer: Medicare Other | Source: Ambulatory Visit | Attending: Nephrology | Admitting: Nephrology

## 2011-11-28 DIAGNOSIS — N183 Chronic kidney disease, stage 3 unspecified: Secondary | ICD-10-CM | POA: Insufficient documentation

## 2011-11-28 DIAGNOSIS — E119 Type 2 diabetes mellitus without complications: Secondary | ICD-10-CM | POA: Insufficient documentation

## 2011-11-28 DIAGNOSIS — D638 Anemia in other chronic diseases classified elsewhere: Secondary | ICD-10-CM | POA: Insufficient documentation

## 2011-11-28 DIAGNOSIS — R6889 Other general symptoms and signs: Secondary | ICD-10-CM | POA: Insufficient documentation

## 2011-11-28 DIAGNOSIS — N2581 Secondary hyperparathyroidism of renal origin: Secondary | ICD-10-CM | POA: Insufficient documentation

## 2011-11-28 LAB — URINALYSIS, ROUTINE W REFLEX MICROSCOPIC
Glucose, UA: NEGATIVE mg/dL
Specific Gravity, Urine: 1.016 (ref 1.005–1.030)
Urobilinogen, UA: 0.2 mg/dL (ref 0.0–1.0)
pH: 7 (ref 5.0–8.0)

## 2011-11-28 LAB — COMPREHENSIVE METABOLIC PANEL
ALT: 11 U/L (ref 0–35)
AST: 21 U/L (ref 0–37)
Albumin: 3.7 g/dL (ref 3.5–5.2)
Alkaline Phosphatase: 54 U/L (ref 39–117)
Chloride: 97 mEq/L (ref 96–112)
Potassium: 3.2 mEq/L — ABNORMAL LOW (ref 3.5–5.1)
Sodium: 145 mEq/L (ref 135–145)
Total Bilirubin: 0.8 mg/dL (ref 0.3–1.2)
Total Protein: 7 g/dL (ref 6.0–8.3)

## 2011-11-28 LAB — IRON AND TIBC
Iron: 42 ug/dL (ref 42–135)
Saturation Ratios: 18 % — ABNORMAL LOW (ref 20–55)
TIBC: 229 ug/dL — ABNORMAL LOW (ref 250–470)
UIBC: 187 ug/dL (ref 125–400)

## 2011-11-28 LAB — TSH: TSH: 27.204 u[IU]/mL — ABNORMAL HIGH (ref 0.350–4.500)

## 2011-11-28 LAB — URINE MICROSCOPIC-ADD ON

## 2011-12-10 ENCOUNTER — Other Ambulatory Visit (HOSPITAL_COMMUNITY): Payer: Self-pay | Admitting: *Deleted

## 2011-12-12 ENCOUNTER — Encounter (HOSPITAL_COMMUNITY)
Admission: RE | Admit: 2011-12-12 | Discharge: 2011-12-12 | Disposition: A | Discharge status: Home / Self Care | Payer: Medicare Other | Source: Ambulatory Visit | Attending: Nephrology | Admitting: Nephrology

## 2011-12-12 LAB — POCT HEMOGLOBIN-HEMACUE: Hemoglobin: 12.2 g/dL (ref 12.0–15.0)

## 2011-12-12 MED ORDER — EPOETIN ALFA 10000 UNIT/ML IJ SOLN: 7500.0000 [IU] | INTRAMUSCULAR | Status: DC

## 2011-12-13 LAB — URINE CULTURE
Colony Count: NO GROWTH
Culture  Setup Time: 201302211115
Culture: NO GROWTH

## 2011-12-20 ENCOUNTER — Other Ambulatory Visit: Payer: Self-pay | Admitting: Orthopedic Surgery

## 2011-12-20 DIAGNOSIS — M25511 Pain in right shoulder: Secondary | ICD-10-CM

## 2011-12-24 ENCOUNTER — Ambulatory Visit
Admission: RE | Admit: 2011-12-24 | Discharge: 2011-12-24 | Disposition: A | Discharge status: Home / Self Care | Payer: Medicare Other | Source: Ambulatory Visit | Attending: Orthopedic Surgery | Admitting: Orthopedic Surgery

## 2011-12-24 DIAGNOSIS — M25511 Pain in right shoulder: Secondary | ICD-10-CM

## 2011-12-26 ENCOUNTER — Encounter (HOSPITAL_COMMUNITY)
Admission: RE | Admit: 2011-12-26 | Discharge: 2011-12-26 | Disposition: A | Discharge status: Home / Self Care | Payer: Medicare Other | Source: Ambulatory Visit | Attending: Nephrology | Admitting: Nephrology

## 2011-12-26 DIAGNOSIS — R6889 Other general symptoms and signs: Secondary | ICD-10-CM | POA: Insufficient documentation

## 2011-12-26 DIAGNOSIS — E119 Type 2 diabetes mellitus without complications: Secondary | ICD-10-CM | POA: Insufficient documentation

## 2011-12-26 DIAGNOSIS — N183 Chronic kidney disease, stage 3 unspecified: Secondary | ICD-10-CM | POA: Insufficient documentation

## 2011-12-26 DIAGNOSIS — N2581 Secondary hyperparathyroidism of renal origin: Secondary | ICD-10-CM | POA: Insufficient documentation

## 2011-12-26 DIAGNOSIS — D638 Anemia in other chronic diseases classified elsewhere: Secondary | ICD-10-CM | POA: Insufficient documentation

## 2011-12-26 LAB — COMPREHENSIVE METABOLIC PANEL
Alkaline Phosphatase: 53 U/L (ref 39–117)
BUN: 37 mg/dL — ABNORMAL HIGH (ref 6–23)
Calcium: 8.7 mg/dL (ref 8.4–10.5)
Creatinine, Ser: 1.7 mg/dL — ABNORMAL HIGH (ref 0.50–1.10)
GFR calc Af Amer: 33 mL/min — ABNORMAL LOW (ref >90)
Glucose, Bld: 293 mg/dL — ABNORMAL HIGH (ref 70–99)
Potassium: 3.7 mEq/L (ref 3.5–5.1)
Total Protein: 7.3 g/dL (ref 6.0–8.3)

## 2011-12-26 LAB — URINALYSIS, ROUTINE W REFLEX MICROSCOPIC
Nitrite: NEGATIVE
Protein, ur: 30 mg/dL — AB
Urobilinogen, UA: 0.2 mg/dL (ref 0.0–1.0)

## 2011-12-26 LAB — URINE MICROSCOPIC-ADD ON

## 2011-12-26 LAB — PROTEIN / CREATININE RATIO, URINE: Total Protein, Urine: 36.1 mg/dL

## 2011-12-26 LAB — POCT HEMOGLOBIN-HEMACUE: Hemoglobin: 12 g/dL (ref 12.0–15.0)

## 2011-12-26 LAB — PHOSPHORUS: Phosphorus: 3.4 mg/dL (ref 2.3–4.6)

## 2011-12-26 MED ORDER — EPOETIN ALFA 10000 UNIT/ML IJ SOLN: 7500.0000 [IU] | INTRAMUSCULAR | Status: DC

## 2011-12-27 LAB — URINE CULTURE
Colony Count: 50000
Culture  Setup Time: 201303071140

## 2011-12-31 LAB — IRON AND TIBC

## 2011-12-31 LAB — SIROLIMUS LEVEL

## 2012-01-08 ENCOUNTER — Other Ambulatory Visit (HOSPITAL_COMMUNITY): Payer: Self-pay | Admitting: *Deleted

## 2012-01-09 ENCOUNTER — Encounter (HOSPITAL_COMMUNITY)
Admission: RE | Admit: 2012-01-09 | Discharge: 2012-01-09 | Disposition: A | Discharge status: Home / Self Care | Payer: Medicare Other | Source: Ambulatory Visit | Attending: Nephrology | Admitting: Nephrology

## 2012-01-09 LAB — POCT HEMOGLOBIN-HEMACUE: Hemoglobin: 11.5 g/dL — ABNORMAL LOW (ref 12.0–15.0)

## 2012-01-09 MED ORDER — EPOETIN ALFA 10000 UNIT/ML IJ SOLN
5000.0000 [IU] | INTRAMUSCULAR | Status: DC
  Administered 2012-01-09: 5000 [IU] via SUBCUTANEOUS
  Filled 2012-01-09: qty 1

## 2012-01-15 ENCOUNTER — Encounter (HOSPITAL_COMMUNITY)
Admission: RE | Admit: 2012-01-15 | Discharge: 2012-01-15 | Disposition: A | Discharge status: Home / Self Care | Payer: Medicare Other | Source: Ambulatory Visit | Attending: Nephrology | Admitting: Nephrology

## 2012-01-15 MED ORDER — FERUMOXYTOL INJECTION 510 MG/17 ML
510.0000 mg | Freq: Once | INTRAVENOUS | Status: AC
  Administered 2012-01-15: 510 mg via INTRAVENOUS

## 2012-01-15 MED ORDER — FERUMOXYTOL INJECTION 510 MG/17 ML
INTRAVENOUS | Status: AC
  Administered 2012-01-15: 510 mg via INTRAVENOUS
  Filled 2012-01-15: qty 17

## 2012-01-15 MED ORDER — SODIUM CHLORIDE 0.9 % IV SOLN
INTRAVENOUS | Status: DC
  Administered 2012-01-15: 11:00:00 via INTRAVENOUS

## 2012-02-06 ENCOUNTER — Encounter (HOSPITAL_COMMUNITY)
Admission: RE | Admit: 2012-02-06 | Discharge: 2012-02-06 | Disposition: A | Discharge status: Home / Self Care | Payer: Medicare Other | Source: Ambulatory Visit | Attending: Nephrology | Admitting: Nephrology

## 2012-02-06 DIAGNOSIS — R6889 Other general symptoms and signs: Secondary | ICD-10-CM | POA: Insufficient documentation

## 2012-02-06 DIAGNOSIS — E119 Type 2 diabetes mellitus without complications: Secondary | ICD-10-CM | POA: Insufficient documentation

## 2012-02-06 DIAGNOSIS — N183 Chronic kidney disease, stage 3 unspecified: Secondary | ICD-10-CM | POA: Insufficient documentation

## 2012-02-06 DIAGNOSIS — D638 Anemia in other chronic diseases classified elsewhere: Secondary | ICD-10-CM | POA: Insufficient documentation

## 2012-02-06 DIAGNOSIS — N2581 Secondary hyperparathyroidism of renal origin: Secondary | ICD-10-CM | POA: Insufficient documentation

## 2012-02-06 LAB — COMPREHENSIVE METABOLIC PANEL
ALT: 16 U/L (ref 0–35)
Alkaline Phosphatase: 58 U/L (ref 39–117)
BUN: 36 mg/dL — ABNORMAL HIGH (ref 6–23)
CO2: 33 mEq/L — ABNORMAL HIGH (ref 19–32)
Chloride: 102 mEq/L (ref 96–112)
GFR calc Af Amer: 32 mL/min — ABNORMAL LOW (ref >90)
GFR calc non Af Amer: 28 mL/min — ABNORMAL LOW (ref >90)
Glucose, Bld: 232 mg/dL — ABNORMAL HIGH (ref 70–99)
Potassium: 3.8 mEq/L (ref 3.5–5.1)
Sodium: 146 mEq/L — ABNORMAL HIGH (ref 135–145)
Total Bilirubin: 0.6 mg/dL (ref 0.3–1.2)

## 2012-02-06 LAB — URINALYSIS, ROUTINE W REFLEX MICROSCOPIC
Bilirubin Urine: NEGATIVE
Glucose, UA: 250 mg/dL — AB
Ketones, ur: NEGATIVE mg/dL
Protein, ur: 100 mg/dL — AB

## 2012-02-06 LAB — URINE MICROSCOPIC-ADD ON

## 2012-02-06 LAB — POCT HEMOGLOBIN-HEMACUE: Hemoglobin: 11.7 g/dL — ABNORMAL LOW (ref 12.0–15.0)

## 2012-02-06 LAB — PHOSPHORUS: Phosphorus: 3 mg/dL (ref 2.3–4.6)

## 2012-02-06 MED ORDER — EPOETIN ALFA 10000 UNIT/ML IJ SOLN: 5000.0000 [IU] | INTRAMUSCULAR | Status: DC

## 2012-02-06 MED ORDER — EPOETIN ALFA 10000 UNIT/ML IJ SOLN
INTRAMUSCULAR | Status: AC
  Administered 2012-02-06: 10000 [IU] via SUBCUTANEOUS
  Filled 2012-02-06: qty 1

## 2012-02-26 ENCOUNTER — Other Ambulatory Visit (HOSPITAL_COMMUNITY): Payer: Self-pay | Admitting: Urology

## 2012-02-26 DIAGNOSIS — N361 Urethral diverticulum: Secondary | ICD-10-CM

## 2012-03-03 ENCOUNTER — Ambulatory Visit (HOSPITAL_COMMUNITY)
Admission: RE | Admit: 2012-03-03 | Discharge: 2012-03-03 | Disposition: A | Discharge status: Home / Self Care | Payer: Medicare Other | Source: Ambulatory Visit | Attending: Urology | Admitting: Urology

## 2012-03-03 DIAGNOSIS — N361 Urethral diverticulum: Secondary | ICD-10-CM

## 2012-03-04 ENCOUNTER — Ambulatory Visit (HOSPITAL_COMMUNITY): Admission: RE | Admit: 2012-03-04 | Payer: Medicare Other | Source: Ambulatory Visit

## 2012-03-05 ENCOUNTER — Encounter (HOSPITAL_COMMUNITY)
Admission: RE | Admit: 2012-03-05 | Discharge: 2012-03-05 | Disposition: A | Discharge status: Home / Self Care | Payer: Medicare Other | Source: Ambulatory Visit | Attending: Nephrology | Admitting: Nephrology

## 2012-03-05 ENCOUNTER — Encounter (HOSPITAL_COMMUNITY): Payer: Medicare Other

## 2012-03-05 ENCOUNTER — Other Ambulatory Visit: Payer: Self-pay | Admitting: Urology

## 2012-03-05 ENCOUNTER — Encounter (HOSPITAL_COMMUNITY): Payer: Self-pay | Admitting: Pharmacy Technician

## 2012-03-05 DIAGNOSIS — E119 Type 2 diabetes mellitus without complications: Secondary | ICD-10-CM | POA: Insufficient documentation

## 2012-03-05 DIAGNOSIS — R6889 Other general symptoms and signs: Secondary | ICD-10-CM | POA: Insufficient documentation

## 2012-03-05 DIAGNOSIS — N2581 Secondary hyperparathyroidism of renal origin: Secondary | ICD-10-CM | POA: Insufficient documentation

## 2012-03-05 DIAGNOSIS — M5104 Intervertebral disc disorders with myelopathy, thoracic region: Secondary | ICD-10-CM

## 2012-03-05 DIAGNOSIS — N183 Chronic kidney disease, stage 3 unspecified: Secondary | ICD-10-CM | POA: Insufficient documentation

## 2012-03-05 DIAGNOSIS — D638 Anemia in other chronic diseases classified elsewhere: Secondary | ICD-10-CM | POA: Insufficient documentation

## 2012-03-05 LAB — URINE MICROSCOPIC-ADD ON

## 2012-03-05 LAB — IRON AND TIBC
Saturation Ratios: 17 % — ABNORMAL LOW (ref 20–55)
TIBC: 205 ug/dL — ABNORMAL LOW (ref 250–470)
UIBC: 171 ug/dL (ref 125–400)

## 2012-03-05 LAB — URINALYSIS, ROUTINE W REFLEX MICROSCOPIC
Bilirubin Urine: NEGATIVE
Glucose, UA: 100 mg/dL — AB
Ketones, ur: NEGATIVE mg/dL
Specific Gravity, Urine: 1.017 (ref 1.005–1.030)
pH: 7 (ref 5.0–8.0)

## 2012-03-05 LAB — COMPREHENSIVE METABOLIC PANEL
BUN: 29 mg/dL — ABNORMAL HIGH (ref 6–23)
Calcium: 9.7 mg/dL (ref 8.4–10.5)
Creatinine, Ser: 2.09 mg/dL — ABNORMAL HIGH (ref 0.50–1.10)
GFR calc Af Amer: 26 mL/min — ABNORMAL LOW (ref >90)
Glucose, Bld: 215 mg/dL — ABNORMAL HIGH (ref 70–99)
Total Protein: 6.7 g/dL (ref 6.0–8.3)

## 2012-03-05 LAB — TSH: TSH: 43.309 u[IU]/mL — ABNORMAL HIGH (ref 0.350–4.500)

## 2012-03-05 LAB — URIC ACID: Uric Acid, Serum: 5.6 mg/dL (ref 2.4–7.0)

## 2012-03-05 LAB — PROTEIN / CREATININE RATIO, URINE
Creatinine, Urine: 111.82 mg/dL
Protein Creatinine Ratio: 0.41 — ABNORMAL HIGH (ref 0.00–0.15)

## 2012-03-05 MED ORDER — EPOETIN ALFA 10000 UNIT/ML IJ SOLN
5000.0000 [IU] | INTRAMUSCULAR | Status: DC
  Administered 2012-03-05: 10000 [IU] via SUBCUTANEOUS
  Filled 2012-03-05: qty 1

## 2012-03-06 LAB — URINE CULTURE
Colony Count: NO GROWTH
Culture  Setup Time: 201305161038

## 2012-03-06 LAB — POCT HEMOGLOBIN-HEMACUE: Hemoglobin: 11.6 g/dL — ABNORMAL LOW (ref 12.0–15.0)

## 2012-03-10 ENCOUNTER — Encounter (HOSPITAL_COMMUNITY): Payer: Self-pay

## 2012-03-10 ENCOUNTER — Inpatient Hospital Stay (HOSPITAL_COMMUNITY): Admission: RE | Admit: 2012-03-10 | Discharge: 2012-03-10 | Payer: Medicare Other | Source: Ambulatory Visit

## 2012-03-10 HISTORY — DX: Essential (primary) hypertension: I10

## 2012-03-10 HISTORY — DX: Type 2 diabetes mellitus with diabetic neuropathy, unspecified: E11.40

## 2012-03-10 HISTORY — DX: Gastro-esophageal reflux disease without esophagitis: K21.9

## 2012-03-10 HISTORY — DX: Unspecified osteoarthritis, unspecified site: M19.90

## 2012-03-10 HISTORY — DX: Hypothyroidism, unspecified: E03.9

## 2012-03-10 HISTORY — DX: Cardiac murmur, unspecified: R01.1

## 2012-03-10 NOTE — Pre-Procedure Instructions (Signed)
[address] NICOLETTE GIESKE  03/10/2012   Your procedure is scheduled on:  Thursday May 23  Report to [facility] at 9:00 AM.  Call this number if you have problems the morning of surgery: [phone]   Remember:   Do not eat food:After Midnight.  May have clear liquids: up to 4 Hours before arrival.  Clear liquids include soda, tea, black coffee, apple or grape juice, broth.  Take these medicines the morning of surgery with A SIP OF WATER: Acyclovir, allopurinol, neurontin, labetalol, synthroid, prednisone, zantac   Do not wear jewelry, make-up or nail polish.  Do not wear lotions, powders, or perfumes. You may wear deodorant.  Do not shave 48 hours prior to surgery. Men may shave face and neck.  Do not bring valuables to the hospital.  Contacts, dentures or bridgework may not be worn into surgery.  Leave suitcase in the car. After surgery it may be brought to your room.  For patients admitted to the hospital, checkout time is 11:00 AM the day of discharge.   Patients discharged the day of surgery will not be allowed to drive home.  Name and phone number of your driver: Ernest Mallick [phone]   Special Instructions: CHG Shower Use Special Wash: 1/2 bottle night before surgery and 1/2 bottle morning of surgery.   Please read over the following fact sheets that you were given: Pain Booklet, Coughing and Deep Breathing and Surgical Site Infection Prevention

## 2012-03-11 ENCOUNTER — Encounter (HOSPITAL_COMMUNITY)
Admission: RE | Admit: 2012-03-11 | Discharge: 2012-03-11 | Disposition: A | Discharge status: Home / Self Care | Payer: Medicare Other | Source: Ambulatory Visit | Attending: Urology | Admitting: Urology

## 2012-03-11 LAB — CBC
Hemoglobin: 11.8 g/dL — ABNORMAL LOW (ref 12.0–15.0)
MCHC: 30.4 g/dL (ref 30.0–36.0)
RBC: 4.47 MIL/uL (ref 3.87–5.11)

## 2012-03-11 LAB — COMPREHENSIVE METABOLIC PANEL
ALT: 10 U/L (ref 0–35)
Alkaline Phosphatase: 63 U/L (ref 39–117)
GFR calc Af Amer: 24 mL/min — ABNORMAL LOW (ref >90)
Glucose, Bld: 386 mg/dL — ABNORMAL HIGH (ref 70–99)
Potassium: 4.5 mEq/L (ref 3.5–5.1)
Sodium: 139 mEq/L (ref 135–145)
Total Protein: 7.3 g/dL (ref 6.0–8.3)

## 2012-03-11 NOTE — Consult Note (Signed)
Anesthesia Consult:  Patient is a 71 year old female scheduled for a MRI of the pelvis under GA on 03/12/12.  PAT lab appointment was 03/11/12 pm.  She cannot lay flat due to back pain.  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  Currently she is followed by Dr. Eliott Nine who saw her just a few months ago.  By notes, her Cr was 1.7 at that time.  She referred patient to Urology due to recurrent UTIs.  She also has DM2 (A1C 8.3 ~ 11/2011) , hypothyroidism, HTN, arthritis, murmur.  Her PCP is Dr. Evlyn Kanner.  EKG on 03/11/12 shows NSR, LAFB, LVH, cannot rule out anterior infarct (age undetermined), lateral ST/T wave abnormality.  Her lateral ST changes are more prominent when compared to her EKGs since 2010.  Echo on 04/01/07 showed: - The left ventricle was mildly dilated. Overall left ventricular systolic function was at the lower limits of normal. Left ventricular ejection fraction was estimated to be 55 %. There was mild diffuse left ventricular hypokinesis. Left ventricular wall thickness was mildly to moderately increased. - The aortic valve was mildly calcified. - Thickened leaflets There was mild mitral valvular regurgitation. - The left atrium was mildly dilated. - Mild MR/TR.  Carotid duplex done on 11/26/11 ([facility]) showed < 50% ICA stenosis bilaterally.  CXR on 05/11/11 showed no active cardiopulmonary disease.  Her labs are pending.  Her BMET is being repeated because her Cr on 03/05/12 was up to 2.09.  Exam finding show a pleasant female in a electric wheelchair.  Heart RRR, with II/VI SEM, harsh left carotid bruits, lungs clear, 1-2 + pedal edema (chronic per patient).  She denies CP, SOB.  She is not very active and primarily uses an electric wheelchair or walker inside the house.    Due to more prominent EKG changes, renal transplant history with rising Cr, and other co-morbidities, I asked Anesthesiologist Dr. Randa Evens to review her chart.  She also asked that the PAT RN notify her of  today's lab results.  Shonna Chock, PA-C

## 2012-03-11 NOTE — Progress Notes (Signed)
Chart given to Shonna Chock for review. Called [facility] for last office note. Faxed abnormal lab values to Dr. Elza Rafter office.

## 2012-03-12 ENCOUNTER — Encounter (HOSPITAL_COMMUNITY): Payer: Self-pay | Admitting: Vascular Surgery

## 2012-03-12 ENCOUNTER — Ambulatory Visit (HOSPITAL_COMMUNITY): Payer: Medicare Other | Admitting: Vascular Surgery

## 2012-03-12 ENCOUNTER — Ambulatory Visit (HOSPITAL_COMMUNITY)
Admission: RE | Admit: 2012-03-12 | Discharge: 2012-03-12 | Disposition: A | Discharge status: Home / Self Care | Payer: Medicare Other | Source: Ambulatory Visit | Attending: Urology | Admitting: Urology

## 2012-03-12 ENCOUNTER — Encounter (HOSPITAL_COMMUNITY)
Admission: RE | Disposition: A | Discharge status: Home / Self Care | Payer: Self-pay | Source: Ambulatory Visit | Attending: Urology

## 2012-03-12 ENCOUNTER — Ambulatory Visit (HOSPITAL_COMMUNITY): Payer: Medicare Other

## 2012-03-12 ENCOUNTER — Encounter (HOSPITAL_COMMUNITY): Payer: Self-pay | Admitting: Pharmacy Technician

## 2012-03-12 DIAGNOSIS — I129 Hypertensive chronic kidney disease with stage 1 through stage 4 chronic kidney disease, or unspecified chronic kidney disease: Secondary | ICD-10-CM | POA: Insufficient documentation

## 2012-03-12 DIAGNOSIS — M549 Dorsalgia, unspecified: Secondary | ICD-10-CM | POA: Insufficient documentation

## 2012-03-12 DIAGNOSIS — Z01810 Encounter for preprocedural cardiovascular examination: Secondary | ICD-10-CM | POA: Insufficient documentation

## 2012-03-12 DIAGNOSIS — M129 Arthropathy, unspecified: Secondary | ICD-10-CM | POA: Insufficient documentation

## 2012-03-12 DIAGNOSIS — K219 Gastro-esophageal reflux disease without esophagitis: Secondary | ICD-10-CM | POA: Insufficient documentation

## 2012-03-12 DIAGNOSIS — N361 Urethral diverticulum: Secondary | ICD-10-CM

## 2012-03-12 DIAGNOSIS — E1142 Type 2 diabetes mellitus with diabetic polyneuropathy: Secondary | ICD-10-CM | POA: Insufficient documentation

## 2012-03-12 DIAGNOSIS — N189 Chronic kidney disease, unspecified: Secondary | ICD-10-CM | POA: Insufficient documentation

## 2012-03-12 DIAGNOSIS — Z940 Kidney transplant status: Secondary | ICD-10-CM | POA: Insufficient documentation

## 2012-03-12 DIAGNOSIS — M5104 Intervertebral disc disorders with myelopathy, thoracic region: Secondary | ICD-10-CM

## 2012-03-12 DIAGNOSIS — Z01812 Encounter for preprocedural laboratory examination: Secondary | ICD-10-CM | POA: Insufficient documentation

## 2012-03-12 DIAGNOSIS — E039 Hypothyroidism, unspecified: Secondary | ICD-10-CM | POA: Insufficient documentation

## 2012-03-12 DIAGNOSIS — M48061 Spinal stenosis, lumbar region without neurogenic claudication: Secondary | ICD-10-CM | POA: Insufficient documentation

## 2012-03-12 DIAGNOSIS — E1149 Type 2 diabetes mellitus with other diabetic neurological complication: Secondary | ICD-10-CM | POA: Insufficient documentation

## 2012-03-12 LAB — GLUCOSE, CAPILLARY
Glucose-Capillary: 339 mg/dL — ABNORMAL HIGH (ref 70–99)
Glucose-Capillary: 338 mg/dL — ABNORMAL HIGH (ref 70–99)
Glucose-Capillary: 249 mg/dL — ABNORMAL HIGH (ref 70–99)
Glucose-Capillary: 270 mg/dL — ABNORMAL HIGH (ref 70–99)

## 2012-03-12 SURGERY — RADIOLOGY WITH ANESTHESIA
Anesthesia: General

## 2012-03-12 MED ORDER — FENTANYL CITRATE 0.05 MG/ML IJ SOLN: 50.0000 ug | INTRAMUSCULAR | Status: DC | PRN

## 2012-03-12 MED ORDER — MIDAZOLAM HCL 2 MG/2ML IJ SOLN: 1.0000 mg | INTRAMUSCULAR | Status: DC | PRN

## 2012-03-12 MED ORDER — FENTANYL CITRATE 0.05 MG/ML IJ SOLN: 25.0000 ug | INTRAMUSCULAR | Status: DC | PRN

## 2012-03-12 MED ORDER — LORAZEPAM 2 MG/ML IJ SOLN: 1.0000 mg | Freq: Once | INTRAMUSCULAR | Status: DC | PRN

## 2012-03-12 MED ORDER — INSULIN ASPART 100 UNIT/ML ~~LOC~~ SOLN
7.0000 [IU] | Freq: Once | SUBCUTANEOUS | Status: AC
  Administered 2012-03-12: 7 [IU] via SUBCUTANEOUS

## 2012-03-12 NOTE — Preoperative (Signed)
Beta Blockers   Reason not to administer Beta Blockers:Not Applicable. Labetalol 300 mg @ 0700 03/12/12

## 2012-03-12 NOTE — Anesthesia Preprocedure Evaluation (Addendum)
Anesthesia Evaluation  Patient identified by MRN, date of birth, ID band Patient awake    Reviewed: Allergy & Precautions, H&P , NPO status , Patient's Chart, lab work & pertinent test results, reviewed documented beta blocker date and time   Airway Mallampati: I TM Distance: >3 FB Neck ROM: Full    Dental  (+) Edentulous Upper and Edentulous Lower   Pulmonary  breath sounds clear to auscultation  Pulmonary exam normal       Cardiovascular hypertension, Pt. on medications and Pt. on home beta blockers     Neuro/Psych    GI/Hepatic GERD-  Medicated and Controlled,  Endo/Other  Diabetes mellitus-, Poorly Controlled, Type 1, Insulin DependentHypothyroidism   Renal/GU H/o renal transplant     Musculoskeletal   Abdominal   Peds  Hematology   Anesthesia Other Findings   Reproductive/Obstetrics                         Anesthesia Physical Anesthesia Plan  ASA: III  Anesthesia Plan: General   Post-op Pain Management:    Induction: Intravenous  Airway Management Planned: LMA  Additional Equipment:   Intra-op Plan:   Post-operative Plan: Extubation in OR  Informed Consent: I have reviewed the patients History and Physical, chart, labs and discussed the procedure including the risks, benefits and alternatives for the proposed anesthesia with the patient or authorized representative who has indicated his/her understanding and acceptance.   Dental advisory given  Plan Discussed with: CRNA and Surgeon  Anesthesia Plan Comments:        Anesthesia Quick Evaluation

## 2012-03-12 NOTE — H&P (Signed)
History of Present Illness     F/u recurrent urinary tract infection referred by Dr. Eliott Nine. She underwent a cadaveric renal transplant in 2007 due to end-stage renal disease from diabetes mellitus. Her kidney transplant has been stable with a BUN of 37 and creatinine of 1.7.  The patient had episodes of urosepsis and hospitalizations with concerns about bacteriuria on 8 occasions from 2008 - 2012. At one point antibiotic prophylaxis/suppressive therapy was halted and she developed urosepsis. Therefore she's been maintained on nitrofurantoin and has done well with no hospitalizations for the urinary tract infection in months.  On reviewing her notes, there was some concern of a urethral diverticulum on a prior VCUG. The VCUG may have shown some reflux into the transplant kidney as well which would be expected given the ureteral anastomosis with the bladder. During one of these episodes and ultrasound of the kidney was unremarkable without hydronephrosis.  When she gets a a UTI she experiences lethargy and mental status changes. She also gets jittery, twitching and jerking.   She has had no bothersome lower urinary tract symptoms. She's had no gross hematuria, frequency or urgency. She voids with a good stream. She will occasionally hold her urine for a long time and will develop urgency. She has no incontinence.   Hysterectomy in 1964. She had back surgery in 2006 and is now in an motorized chair to ambulate.  Interval Hx   She follows up today for renal ultrasound post void and pelvic exam with cystoscopy to rule out urethral diverticulum.  Renal ultrasound - see admission sheet for details, I reviewed all the images, findings: Her native kidneys are small and atrophic measuring around 7 cm each. Hyperechoic area in the right upper pole 1.75 cm consistent with a cyst. 2 scattered hyperechoic calcifications in the right kidney measuring 5 mm each. The left kidney has a hypoechoic cyst of 1.9 cm in  the left lower pole and a hyperechoic calcification of 5 mm in the left lower pole. The transplant kidney is normal without mass, stone or hydronephrosis. There is good blood flow. Post void residual 34 mL.   Past Medical History Problems  1. History of  Arthritis V13.4 2. History of  Diabetes Mellitus 250.00 3. History of  Esophageal Reflux 530.81 4. History of  Hypercholesterolemia 272.0 5. History of  Hypertension 401.9 6. History of  Murmurs 785.2  Surgical History Problems  1. History of  Hysterectomy V45.77 2. History of  Lower Back Surgery 3. History of  Renal Transplant RLQ 4. History of  Thyroid Surgery  Current Meds 1. Allopurinol 300 MG Oral Tablet; Therapy: (Recorded:15Apr2013) to 2. AmLODIPine Besylate 10 MG Oral Tablet; Therapy: (Recorded:15Apr2013) to 3. Calcitriol 0.5 MCG Oral Capsule; Therapy: (Recorded:15Apr2013) to 4. CellCept TABS; Therapy: (Recorded:15Apr2013) to 5. Duragesic-12 12 MCG/HR Transdermal Patch 72 Hour; Therapy: (Recorded:15Apr2013) to 6. Duragesic-50 PT72; Therapy: (Recorded:15Apr2013) to 7. Gabapentin 100 MG Oral Capsule; Therapy: (Recorded:15Apr2013) to 8. HumaLOG SOLN; Therapy: (Recorded:15Apr2013) to 9. Ketoconazole 2 % External Cream; Therapy: (Recorded:15Apr2013) to 10. Klor-Con M20 20 MEQ Oral Tablet Extended Release; Therapy: (Recorded:15Apr2013) to 11. Labetalol HCl TABS; Therapy: (Recorded:15Apr2013) to 12. Levemir SOLN; Therapy: (Recorded:15Apr2013) to 13. Lipitor 40 MG Oral Tablet; Therapy: (Recorded:15Apr2013) to 14. Magnesium Oxide TABS; Therapy: (Recorded:15Apr2013) to 15. Rapamune 1 MG Oral Tablet; Therapy: (Recorded:15Apr2013) to 16. Synthroid 175 MCG Oral Tablet; Therapy: (Recorded:15Apr2013) to 17. Vitamin D TABS; Therapy: (Recorded:15Apr2013) to 18. Xalatan 0.005 % Ophthalmic Solution; Therapy: (Recorded:15Apr2013) to 19. Zantac TABS; Therapy: (Recorded:15Apr2013) to  Allergies  Medication  1. Contrast Media Ready-Box  MISC 2. Prograf CAPS 3. Aspirin TABS 4. Penicillins  Family History Problems  1. Family history of  Death In The Family Father Died age 70-Cancer of pancreas 2. Family history of  Death In The Family Mother Died age 28-Diabetes 3. Maternal history of  Diabetes Mellitus V18.0 4. Fraternal history of  Diabetes Mellitus V18.0 5. Sororal history of  Diabetes Mellitus V18.0 6. Family history of  Family Health Status Number Of Children 1 daughter 2. Paternal history of  Nephrolithiasis 8. Paternal history of  Prostate Cancer V16.42  Social History Problems  1. Former Smoker V15.82 Smoked about 1/2 ppd for for 20 yrs and quit 1980 2. Marital History - Currently Married 3. 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 History of  Alcohol Use 5. History of  Caffeine Use  Vitals Vital Signs [Data Includes: Last 1 Day]  07May2013 03:18PM  Blood Pressure: 125 / 59 Temperature: 98.7 F Heart Rate: 72  Physical Exam Abdomen: The abdomen is soft and nontender. No masses are palpated. No CVA tenderness. No hernias are palpable. No hepatosplenomegaly noted.  Genitourinary: Examination of the external genitalia shows normal female external genitalia and no lesions. The urethra is normal in appearance and not tender. There is no urethral mass. There is a moderate, thick and white urethral discharge . A periurethral cyst is present. Vaginal exam demonstrates no abnormalities. The adnexa are palpably normal. The bladder is non tender and not distended. The anus is normal on inspection. The perineum is normal on inspection.    Results/Data Urine [Data Includes: Last 1 Day]   07May2013  COLOR YELLOW   APPEARANCE CLOUDY   SPECIFIC GRAVITY 1.015   pH 5.5   GLUCOSE NEG mg/dL  BILIRUBIN NEG   KETONE NEG mg/dL  BLOOD SMALL   PROTEIN NEG mg/dL  UROBILINOGEN 0.2 mg/dL  NITRITE NEG   LEUKOCYTE ESTERASE LARGE   SQUAMOUS EPITHELIAL/HPF FEW   WBC 21-50 WBC/hpf  RBC NONE SEEN RBC/hpf  BACTERIA FEW    CRYSTALS NONE SEEN   CASTS NONE SEEN    Procedure  Procedure: Cystoscopy  Indication: Frequent UTI.  Informed Consent: Risks, benefits, and potential adverse events were discussed and informed consent was obtained from the patient.  Prep: The patient was prepped with betadine.  Antibiotic prophylaxis:. Nitrofurantoin.  Procedure Note:  Urethral meatus:. No abnormalities.  Anterior urethra: No abnormalities.  There was no stricture or lesion of the urethra but there was a thick white discharge in the urethra. Palpation under the urethra revealed a cystic mass consistent with possible urethral diverticulum and palpation of this mass revealed discharge into the urethral lumen. There may have been a lumen on the left side of the mid urethra.  Bladder: Visulization was obscured due to cloudy urine. The ureteral orifices were in the normal anatomic position bilaterally and had clear efflux of urine. A systematic survey of the bladder demonstrated no bladder tumors or stones. The mucosa was smooth without abnormalities. The patient tolerated the procedure well.  Complications: None.    Assessment Assessed  1. 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 Cysto  Requested for: 07May2013 Pyuria (791.9), Urethral Diverticulum (599.2)  2. MRI-PELVIS  Requested for: 07May2013 3. Follow-up After Test Office  Follow-up  Requested for: 07May2013  UA With REFLEX  Status: Complete  Done: 01Jan0001 12:00AM Ordered Today; For: Health Maintenance (V70.0); Ordered By: Jerilee Field  Due: 09May2013 Marked Important   Signatures Electronically signed  by : Jerilee Field, M.D.; Feb 25 2012  3:58PM

## 2012-03-12 NOTE — H&P (Signed)
History and Physical   History of Present Illness: H/O recurrent urinary tract infections.  VCUG:? Urethral diverticulum.  Had cadaveric kidney transplant in 2007.  Needs MRI for evaluation of urethral diverticulum.  Failed twice to have it awake.  Requires anesthesia.  Past Medical History  Diagnosis Date  . Diabetes mellitus   . Diabetic neuropathy   . Hypothyroidism   . Hypertension   . GERD (gastroesophageal reflux disease)   . Heart murmur     not treated for  . Arthritis    Past Surgical History  Procedure Date  . Kidney transplant 2007    At [facility]  . Back surgery     ruptured disk  . Thyroid surgery     moved from neck to arm  . Abdominal hysterectomy   . Appendectomy   . Refractive surgery     Medications:Reviewed Allergies:  Allergies  Allergen Reactions  . Adhesive (Tape)   . Aspirin     Crawling feeling under skin  . Hydrocodone Other (See Comments)    Hallucinations   . Iohexol      Desc: pt unable to tell what the reaction was   . Oxycodone   . Penicillins Hives and Swelling  . Tacrolimus Other (See Comments)    Hallucinations   . Contrast Media (Iodinated Diagnostic Agents) Hives, Swelling and Rash    Family History  Problem Relation Age of Onset  . Anesthesia problems Neg Hx    Social History:  reports that she quit smoking about 33 years ago. She does not have any smokeless tobacco history on file. She reports that she does not drink alcohol or use illicit drugs.  ROS: All systems are reviewed and negative except as noted.   Physical Exam:  Vital signs in last 24 hours: Temp:  [97.1 F (36.2 C)-98.3 F (36.8 C)] 98.3 F (36.8 C) (05/23 0937) Pulse Rate:  [72-75] 72  (05/23 0937) Resp:  [18] 18  (05/23 0937) BP: (125-145)/(51-62) 125/51 mmHg (05/23 0937) SpO2:  [94 %-97 %] 94 % (05/23 0937) Weight:  [162 lb 11.2 oz (73.8 kg)] 162 lb 11.2 oz (73.8 kg) (05/22 1516)  Cardiovascular: Skin warm; not flushed Respiratory: Breaths quiet;  no shortness of breath Abdomen: No masses Neurological: Normal sensation to touch Musculoskeletal: Normal motor function arms and legs Lymphatics: No inguinal adenopathy Skin: No rashes Genitourinary:normal female genitalia  Laboratory Data:  Results for orders placed during the hospital encounter of 03/12/12 (from the past 24 hour(s))  GLUCOSE, CAPILLARY     Status: Abnormal   Collection Time   03/12/12  9:28 AM      Component Value Range   Glucose-Capillary 339 (*) 70 - 99 (mg/dL)  GLUCOSE, CAPILLARY     Status: Abnormal   Collection Time   03/12/12 10:55 AM      Component Value Range   Glucose-Capillary 338 (*) 70 - 99 (mg/dL)   Recent Results (from the past 240 hour(s))  URINE CULTURE     Status: Normal   Collection Time   03/05/12  9:38 AM      Component Value Range Status Comment   Specimen Description URINE, CLEAN CATCH   Final    Special Requests NONE   Final    Culture  Setup Time [id number]   Final    Colony Count NO GROWTH   Final    Culture NO GROWTH   Final    Report Status 03/06/2012 FINAL   Final    Creatinine:  Basename 03/11/12 1536  CREATININE 2.20*     Impression/Assessment:  Recurrent Urinary Tract Infections  Plan:  MRI pelvis  [name] 03/12/2012, 12:28 PM       CYSTOSCOPY HOME CARE INSTRUCTIONS  Activity: Rest for the remainder of the day.  Do not drive or operate equipment today.  You may resume normal activities in one to two days as instructed by your physician.   Meals: Drink plenty of liquids and eat light foods such as gelatin or soup this evening.  You may return to a normal meal plan tomorrow.  Return to Work: You may return to work in one to two days or as instructed by your physician.  Special Instructions / Symptoms: Call your physician if any of these symptoms occur:   -persistent or heavy bleeding  -bleeding which continues after first few urination  -large blood clots that are difficult to pass  -urine  stream diminishes or stops completely  -fever equal to or higher than 101 degrees Farenheit.  -cloudy urine with a strong, foul odor  -severe pain  Females should always wipe from front to back after elimination.  You may feel some burning pain when you urinate.  This should disappear with time.  Applying moist heat to the lower abdomen or a hot tub bath may help relieve the pain. \  Follow-Up / Date of Return Visit to Your Physician:   Call for an appointment to arrange follow-up.  Patient Signature:  ________________________________________________________  Nurse's Signature:  ________________________________________________________

## 2012-03-12 NOTE — Progress Notes (Signed)
Dr. Randa Evens at bedside, aware abt. CBG of 270. No new orders given.

## 2012-03-12 NOTE — Anesthesia Postprocedure Evaluation (Signed)
Anesthesia Post Note  Patient: [name]  Procedure(s) Performed: Procedure(s) (LRB): RADIOLOGY WITH ANESTHESIA (N/A)  Anesthesia type: general  Patient location: PACU  Post pain: Pain level controlled  Post assessment: Patient's Cardiovascular Status Stable  Last Vitals: There were no vitals filed for this visit.  Post vital signs: Reviewed and stable  Level of consciousness: sedated  Complications: No apparent anesthesia complications

## 2012-03-12 NOTE — Progress Notes (Signed)
Pt.'s CBG 339 at 0930, A. Zelenak,PAC, aware. New orders rec'd- SQ insulin

## 2012-03-12 NOTE — Transfer of Care (Signed)
Immediate Anesthesia Transfer of Care Note  Patient: [name]  Procedure(s) Performed: Procedure(s) (LRB): RADIOLOGY WITH ANESTHESIA (N/A)  Patient Location: PACU  Anesthesia Type: General  Level of Consciousness: awake  Airway & Oxygen Therapy: Patient Spontanous Breathing and Patient connected to face mask oxygen  Post-op Assessment: Report given to PACU RN and Post -op Vital signs reviewed and stable  Post vital signs: Reviewed and stable  Complications: No apparent anesthesia complications

## 2012-03-26 ENCOUNTER — Other Ambulatory Visit: Payer: Self-pay | Admitting: Urology

## 2012-03-30 MED ORDER — CLINDAMYCIN PHOSPHATE 2 % VA CREA: 1.0000 | TOPICAL_CREAM | Freq: Every day | VAGINAL | Status: AC

## 2012-03-31 ENCOUNTER — Other Ambulatory Visit (HOSPITAL_COMMUNITY): Payer: Self-pay | Admitting: *Deleted

## 2012-04-02 ENCOUNTER — Encounter (HOSPITAL_COMMUNITY): Payer: Medicare Other

## 2012-04-02 ENCOUNTER — Encounter (HOSPITAL_COMMUNITY)
Admission: RE | Admit: 2012-04-02 | Discharge: 2012-04-02 | Disposition: A | Discharge status: Home / Self Care | Payer: Medicare Other | Source: Ambulatory Visit | Attending: Nephrology | Admitting: Nephrology

## 2012-04-02 DIAGNOSIS — E119 Type 2 diabetes mellitus without complications: Secondary | ICD-10-CM | POA: Insufficient documentation

## 2012-04-02 DIAGNOSIS — D638 Anemia in other chronic diseases classified elsewhere: Secondary | ICD-10-CM | POA: Insufficient documentation

## 2012-04-02 DIAGNOSIS — R6889 Other general symptoms and signs: Secondary | ICD-10-CM | POA: Insufficient documentation

## 2012-04-02 DIAGNOSIS — N183 Chronic kidney disease, stage 3 unspecified: Secondary | ICD-10-CM | POA: Insufficient documentation

## 2012-04-02 DIAGNOSIS — N2581 Secondary hyperparathyroidism of renal origin: Secondary | ICD-10-CM | POA: Insufficient documentation

## 2012-04-02 LAB — URINE MICROSCOPIC-ADD ON

## 2012-04-02 LAB — IRON AND TIBC
Saturation Ratios: 32 % (ref 20–55)
TIBC: 133 ug/dL — ABNORMAL LOW (ref 250–470)
UIBC: 91 ug/dL — ABNORMAL LOW (ref 125–400)

## 2012-04-02 LAB — COMPREHENSIVE METABOLIC PANEL
ALT: 12 U/L (ref 0–35)
AST: 30 U/L (ref 0–37)
Albumin: 3.7 g/dL (ref 3.5–5.2)
Calcium: 9.7 mg/dL (ref 8.4–10.5)
Creatinine, Ser: 1.99 mg/dL — ABNORMAL HIGH (ref 0.50–1.10)
Sodium: 143 mEq/L (ref 135–145)
Total Protein: 7.7 g/dL (ref 6.0–8.3)

## 2012-04-02 LAB — URINALYSIS, ROUTINE W REFLEX MICROSCOPIC
Glucose, UA: NEGATIVE mg/dL
Nitrite: NEGATIVE
pH: 6 (ref 5.0–8.0)

## 2012-04-02 LAB — FERRITIN: Ferritin: 585 ng/mL — ABNORMAL HIGH (ref 10–291)

## 2012-04-02 LAB — PROTEIN / CREATININE RATIO, URINE
Creatinine, Urine: 82.87 mg/dL
Total Protein, Urine: 40.5 mg/dL

## 2012-04-02 MED ORDER — FERUMOXYTOL INJECTION 510 MG/17 ML
510.0000 mg | Freq: Once | INTRAVENOUS | Status: AC
  Administered 2012-04-02: 510 mg via INTRAVENOUS
  Filled 2012-04-02: qty 17

## 2012-04-02 MED ORDER — EPOETIN ALFA 10000 UNIT/ML IJ SOLN
INTRAMUSCULAR | Status: AC
  Administered 2012-04-02: 10000 [IU]
  Filled 2012-04-02: qty 1

## 2012-04-02 MED ORDER — SODIUM CHLORIDE 0.9 % IV SOLN
Freq: Once | INTRAVENOUS | Status: AC
  Administered 2012-04-02: 10:00:00 via INTRAVENOUS

## 2012-04-03 LAB — URINE CULTURE
Colony Count: 6000
Culture  Setup Time: 201306131230

## 2012-04-13 LAB — SIROLIMUS LEVEL: Sirolimus (Rapamycin): 4.4 ng/mL

## 2012-04-15 ENCOUNTER — Encounter (HOSPITAL_COMMUNITY): Payer: Self-pay | Admitting: Pharmacy Technician

## 2012-04-16 ENCOUNTER — Encounter (HOSPITAL_COMMUNITY): Payer: Self-pay

## 2012-04-16 ENCOUNTER — Encounter (HOSPITAL_COMMUNITY)
Admission: RE | Admit: 2012-04-16 | Discharge: 2012-04-16 | Disposition: A | Discharge status: Home / Self Care | Payer: Medicare Other | Source: Ambulatory Visit | Attending: Urology | Admitting: Urology

## 2012-04-16 HISTORY — DX: Unspecified visual loss: H54.7

## 2012-04-16 HISTORY — DX: Chronic kidney disease, unspecified: N18.9

## 2012-04-16 HISTORY — DX: Urethral diverticulum: N36.1

## 2012-04-16 HISTORY — DX: Personal history of other infectious and parasitic diseases: Z86.19

## 2012-04-16 HISTORY — DX: Nail disorder, unspecified: L60.9

## 2012-04-16 LAB — BASIC METABOLIC PANEL
BUN: 26 mg/dL — ABNORMAL HIGH (ref 6–23)
CO2: 33 mEq/L — ABNORMAL HIGH (ref 19–32)
Calcium: 9.6 mg/dL (ref 8.4–10.5)
Creatinine, Ser: 1.62 mg/dL — ABNORMAL HIGH (ref 0.50–1.10)
GFR calc non Af Amer: 30 mL/min — ABNORMAL LOW (ref >90)
Glucose, Bld: 202 mg/dL — ABNORMAL HIGH (ref 70–99)
Sodium: 142 mEq/L (ref 135–145)

## 2012-04-16 LAB — SURGICAL PCR SCREEN: MRSA, PCR: NEGATIVE

## 2012-04-16 LAB — CBC
Hemoglobin: 11.6 g/dL — ABNORMAL LOW (ref 12.0–15.0)
MCH: 26 pg (ref 26.0–34.0)
MCHC: 30 g/dL (ref 30.0–36.0)
MCV: 86.8 fL (ref 78.0–100.0)
RBC: 4.46 MIL/uL (ref 3.87–5.11)

## 2012-04-16 NOTE — Pre-Procedure Instructions (Signed)
PT HAS A CXR REPORT 05/11/11 AND EKG REPORT 03/12/12 -BOTH REPORTS DONE AT [facility]-REPORTS IN EPIC AND COPIES ON PT'S CHART. CBC, BMET WERE DONE TODAY AT [facility] PER ANESTHESIOLOGIST'S GUIDELINES AND TSH WAS DRAWN AS PER ORDER DR. Evlyn Kanner. PREOP INSTRUCTIONS DISCUSSED WITH PT AND HER HUSBAND USING TEACH BACK METHOD.

## 2012-04-16 NOTE — Patient Instructions (Signed)
YOUR SURGERY IS SCHEDULED ON:  Tuesday  7/9  AT 7:15 AM  REPORT TO [facility] AT:  5:15 AM      PHONE # FOR SHORT STAY IS [phone]  DO NOT EAT OR DRINK ANYTHING AFTER MIDNIGHT THE NIGHT BEFORE YOUR SURGERY.  YOU MAY BRUSH YOUR TEETH, RINSE OUT YOUR MOUTH--BUT NO WATER, NO FOOD, NO CHEWING GUM, NO MINTS, NO CANDIES, NO CHEWING TOBACCO.  PLEASE TAKE THE FOLLOWING MEDICATIONS THE AM OF YOUR SURGERY WITH A FEW SIPS OF WATER:  ALLOPURINOL, NORVASC, NEURONTIN, LABETALOL, SYNTHROID, CELLCEPT, PREDNISONE, ZANTAC, REPAMUNE.        BRING YOUR EYE DROPS AND YOUR MEDS THAT PREVENT REJECTION OF YOUR KIDNEY TO THE HOSPITAL.    IF YOU USE INHALERS--USE YOUR INHALERS THE AM OF YOUR SURGERY AND BRING INHALERS TO THE HOSPITAL -TAKE TO SURGERY.    IF YOU ARE DIABETIC:  DO NOT TAKE ANY DIABETIC MEDICATIONS THE AM OF YOUR SURGERY.  IF YOU TAKE INSULIN IN THE EVENINGS--PLEASE ONLY TAKE 1/2 NORMAL EVENING DOSE THE NIGHT BEFORE YOUR SURGERY.  NO INSULIN THE AM OF YOUR SURGERY.  IF YOU HAVE SLEEP APNEA AND USE CPAP OR BIPAP--PLEASE BRING THE MASK --NOT THE MACHINE-NOT THE TUBING   -JUST THE MASK. DO NOT BRING VALUABLES, MONEY, CREDIT CARDS.  CONTACT LENS, DENTURES / PARTIALS, GLASSES SHOULD NOT BE WORN TO SURGERY AND IN MOST CASES-HEARING AIDS WILL NEED TO BE REMOVED.  BRING YOUR GLASSES CASE, ANY EQUIPMENT NEEDED FOR YOUR CONTACT LENS. FOR PATIENTS ADMITTED TO THE HOSPITAL--CHECK OUT TIME THE DAY OF DISCHARGE IS 11:00 AM.  ALL INPATIENT ROOMS ARE PRIVATE - WITH BATHROOM, TELEPHONE, TELEVISION AND WIFI INTERNET. IF YOU ARE BEING DISCHARGED THE SAME DAY OF YOUR SURGERY--YOU CAN NOT DRIVE YOURSELF HOME--AND SHOULD NOT GO HOME ALONE BY TAXI OR BUS.  NO DRIVING OR OPERATING MACHINERY FOR 24 HOURS FOLLOWING ANESTHESIA / PAIN MEDICATIONS.                            SPECIAL INSTRUCTIONS:  CHLORHEXIDINE SOAP SHOWER (other brand names are Betasept and Hibiclens ) PLEASE SHOWER WITH CHLORHEXIDINE THE NIGHT  BEFORE YOUR SURGERY AND THE AM OF YOUR SURGERY. DO NOT USE CHLORHEXIDINE ON YOUR FACE OR PRIVATE AREAS--YOU MAY USE YOUR NORMAL SOAP THOSE AREAS AND YOUR NORMAL SHAMPOO.  WOMEN SHOULD AVOID SHAVING UNDER ARMS AND SHAVING LEGS 48 HOURS BEFORE USING CHLORHEXIDINE TO AVOID SKIN IRRITATION.  DO NOT USE IF ALLERGIC TO CHLORHEXIDINE.  PLEASE READ OVER ANY  FACT SHEETS THAT YOU WERE GIVEN: MRSA INFORMATION

## 2012-04-17 NOTE — Pre-Procedure Instructions (Signed)
TSH REPORT FAXED TO DR. Rinaldo Cloud OFFICE

## 2012-04-24 ENCOUNTER — Other Ambulatory Visit (HOSPITAL_COMMUNITY): Payer: Self-pay | Admitting: *Deleted

## 2012-04-27 NOTE — Anesthesia Preprocedure Evaluation (Addendum)
Anesthesia Evaluation  Patient identified by MRN, date of birth, ID band Patient awake    Reviewed: Allergy & Precautions, H&P , NPO status , Patient's Chart, lab work & pertinent test results, reviewed documented beta blocker date and time   Airway Mallampati: II TM Distance: >3 FB Neck ROM: full    Dental  (+) Edentulous Upper and Edentulous Lower   Pulmonary neg pulmonary ROS,  breath sounds clear to auscultation  Pulmonary exam normal       Cardiovascular Exercise Tolerance: Good hypertension, On Medications and On Home Beta Blockers Rhythm:regular Rate:Normal + Systolic murmurs    Neuro/Psych negative neurological ROS  negative psych ROS   GI/Hepatic negative GI ROS, Neg liver ROS, GERD-  Medicated and Controlled,  Endo/Other  negative endocrine ROSWell Controlled, Type 2, Insulin DependentHypothyroidism   Renal/GU negative Renal ROSHx. Renal transplant 2008. Stable.  negative genitourinary   Musculoskeletal   Abdominal   Peds  Hematology negative hematology ROS (+)   Anesthesia Other Findings   Reproductive/Obstetrics negative OB ROS                          Anesthesia Physical Anesthesia Plan  ASA: III  Anesthesia Plan: General   Post-op Pain Management:    Induction: Intravenous  Airway Management Planned: Oral ETT  Additional Equipment:   Intra-op Plan:   Post-operative Plan: Extubation in OR  Informed Consent: I have reviewed the patients History and Physical, chart, labs and discussed the procedure including the risks, benefits and alternatives for the proposed anesthesia with the patient or authorized representative who has indicated his/her understanding and acceptance.   Dental Advisory Given  Plan Discussed with: CRNA and Surgeon  Anesthesia Plan Comments:       Anesthesia Quick Evaluation

## 2012-04-27 NOTE — H&P (Signed)
Urology Admission H&P  History of Present Illness: History of recurrent urinary tract infection urosepsis and hospitalizations with concerns about bacteriuria on 8 occasions from 2008 - 2012. At one point antibiotic prophylaxis/suppressive therapy was halted and she developed urosepsis. Therefore she's been maintained on nitrofurantoin and has done well with no hospitalizations for the urinary tract infection in months.  She underwent a cadaveric renal transplant in 2007 due to end-stage renal disease from diabetes mellitus. Her kidney transplant has been stable with a BUN of 37 and creatinine of 1.7. She sees Dr. Eliott Nine at [facility]   Hysterectomy in 1964. She had back surgery in 2006 and is now in an motorized chair to ambulate.  Cystoscopy and exam - purulent drainage from urethra. Diverticulum urethra. 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 She continues nitrofurantoin.    Past Medical History  Diagnosis Date  . Diabetes mellitus   . Diabetic neuropathy   . Hypothyroidism   . Hypertension   . GERD (gastroesophageal reflux disease)   . Heart murmur     not treated for  . Arthritis   . Chronic kidney disease     RENAL TRANSPLANT IN 2008--PT IS FOLLOWED BY DR. Karie Fetch WITH BUN OF 37 AND CREAT 1.7--PER NOTE DR. NESI FROM 04/11/12  . Urethral diverticulum   . Eyesight diminished     GLAUCOMA   . History of shingles     LEFT EYE JAN 2012--STILL HAS EYE PAIN-STATES NOT ABLE TO TAKE THE MEDICATION FOR SHINGLES BECAUSE OF HER HX OF KIDNEY TRANSPLANT  . Fingernail abnormalities     FUNGUS OF FINGERNAILS   Past Surgical History  Procedure Date  . Kidney transplant 2008    At [facility]  . Back surgery     ruptured disk  . Abdominal hysterectomy   . Appendectomy   . Laser surgery of both eyes for hemorrhages   . 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  Adhesive (Tape)   . Aspirin     Crawling feeling under skin  . Hydrocodone Other (See Comments)    Hallucinations   . Iohexol      Desc: pt unable to tell what the reaction was   . Oxycodone   . Penicillins Hives and Swelling  . Tacrolimus Other (See Comments)    Hallucinations   . Contrast Media (Iodinated Diagnostic Agents) Hives, Swelling and Rash    Family History  Problem Relation Age of Onset  . Anesthesia problems Neg Hx    Social History:  reports that she quit smoking about 33 years ago. She does not have any smokeless tobacco history on file. She reports that she does not drink alcohol or use illicit drugs.  Review of Systems  Constitutional: Negative.   HENT: Negative.   Eyes: Positive for blurred vision.  Respiratory: Negative.   Cardiovascular: Negative.   Gastrointestinal: Negative.   Genitourinary: Positive for dysuria.  Musculoskeletal: Positive for back pain.  Skin: Negative.   Neurological: Negative.   Endo/Heme/Allergies: Negative.   Psychiatric/Behavioral: Negative.     Physical Exam:  Vital signs in last 24 hours:   Physical Exam NAD A&Ox3 CV RRR Lungs - reg effort, rate and depth Abd - soft, NT Ext - no CCE  Laboratory Data:  BMP reviewed - CR at baseline 1.6  Renal ultrasound May 2013 -- Her native kidneys are small and atrophic  measuring around 7 cm each. Hyperechoic area in the right upper pole 1.75 cm consistent with a cyst. 2 scattered hyperechoic calcifications in the right kidney measuring 5 mm each. The left kidney has a hypoechoic cyst of 1.9 cm in the left lower pole and a hyperechoic calcification of 5 mm in the left lower pole. The transplant kidney is normal without mass, stone or hydronephrosis. There is good blood flow. Post void residual 34 mL.  MRI - complex urethral diverticulum with septations and debris. The diverticulum measures 3.5 x 3.3 x 3.0 cm.    Impression/Assessment:  Urethral diverticulum End stage renal  disease status post renal transplant Diabetes Pt seen by Dr. Eliott Nine and Dr. Evlyn Kanner and cleared for surgery.   Plan:  I discussed with the patient and her husband this morning the nature, potential benefits, risks and alternatives to urethral diverticulectomy, including side effects of the proposed treatment, the likelihood of the patient achieving the goals of the procedure, and any potential problems that might occur during the procedure or recuperation. All questions answered. Patient elects to proceed as with diverticulectomy. She said she has been wearing a pad due to the purulent urethral discharge and is ready to get it fixed.

## 2012-04-28 ENCOUNTER — Ambulatory Visit (HOSPITAL_COMMUNITY)
Admission: RE | Admit: 2012-04-28 | Discharge: 2012-04-29 | Disposition: A | Discharge status: Home / Self Care | Payer: Medicare Other | Source: Ambulatory Visit | Attending: Urology | Admitting: Urology

## 2012-04-28 ENCOUNTER — Encounter (HOSPITAL_COMMUNITY): Payer: Self-pay | Admitting: Anesthesiology

## 2012-04-28 ENCOUNTER — Encounter (HOSPITAL_COMMUNITY): Payer: Self-pay | Admitting: *Deleted

## 2012-04-28 ENCOUNTER — Ambulatory Visit (HOSPITAL_COMMUNITY): Payer: Medicare Other | Admitting: Anesthesiology

## 2012-04-28 ENCOUNTER — Encounter (HOSPITAL_COMMUNITY)
Admission: RE | Disposition: A | Discharge status: Home / Self Care | Payer: Self-pay | Source: Ambulatory Visit | Attending: Urology

## 2012-04-28 DIAGNOSIS — N361 Urethral diverticulum: Secondary | ICD-10-CM | POA: Diagnosis present

## 2012-04-28 DIAGNOSIS — IMO0001 Reserved for inherently not codable concepts without codable children: Secondary | ICD-10-CM | POA: Diagnosis present

## 2012-04-28 DIAGNOSIS — N179 Acute kidney failure, unspecified: Secondary | ICD-10-CM | POA: Diagnosis present

## 2012-04-28 DIAGNOSIS — N186 End stage renal disease: Secondary | ICD-10-CM | POA: Insufficient documentation

## 2012-04-28 DIAGNOSIS — Z01812 Encounter for preprocedural laboratory examination: Secondary | ICD-10-CM

## 2012-04-28 DIAGNOSIS — E1142 Type 2 diabetes mellitus with diabetic polyneuropathy: Secondary | ICD-10-CM | POA: Insufficient documentation

## 2012-04-28 DIAGNOSIS — A419 Sepsis, unspecified organism: Secondary | ICD-10-CM | POA: Diagnosis present

## 2012-04-28 DIAGNOSIS — I129 Hypertensive chronic kidney disease with stage 1 through stage 4 chronic kidney disease, or unspecified chronic kidney disease: Secondary | ICD-10-CM | POA: Diagnosis present

## 2012-04-28 DIAGNOSIS — N390 Urinary tract infection, site not specified: Secondary | ICD-10-CM | POA: Diagnosis present

## 2012-04-28 DIAGNOSIS — Z940 Kidney transplant status: Secondary | ICD-10-CM | POA: Insufficient documentation

## 2012-04-28 DIAGNOSIS — G9341 Metabolic encephalopathy: Secondary | ICD-10-CM | POA: Diagnosis present

## 2012-04-28 DIAGNOSIS — I120 Hypertensive chronic kidney disease with stage 5 chronic kidney disease or end stage renal disease: Secondary | ICD-10-CM | POA: Insufficient documentation

## 2012-04-28 DIAGNOSIS — Z9889 Other specified postprocedural states: Secondary | ICD-10-CM

## 2012-04-28 DIAGNOSIS — E1149 Type 2 diabetes mellitus with other diabetic neurological complication: Secondary | ICD-10-CM | POA: Insufficient documentation

## 2012-04-28 DIAGNOSIS — E039 Hypothyroidism, unspecified: Secondary | ICD-10-CM | POA: Insufficient documentation

## 2012-04-28 DIAGNOSIS — N183 Chronic kidney disease, stage 3 unspecified: Secondary | ICD-10-CM | POA: Diagnosis present

## 2012-04-28 HISTORY — PX: URETHRAL DIVERTICULECTOMY: SHX2618

## 2012-04-28 LAB — GLUCOSE, CAPILLARY
Glucose-Capillary: 99 mg/dL (ref 70–99)
Glucose-Capillary: 120 mg/dL — ABNORMAL HIGH (ref 70–99)
Glucose-Capillary: 264 mg/dL — ABNORMAL HIGH (ref 70–99)
Glucose-Capillary: 374 mg/dL — ABNORMAL HIGH (ref 70–99)

## 2012-04-28 LAB — CBC
Hemoglobin: 11.7 g/dL — ABNORMAL LOW (ref 12.0–15.0)
MCH: 26.4 pg (ref 26.0–34.0)
MCHC: 31.2 g/dL (ref 30.0–36.0)

## 2012-04-28 LAB — GRAM STAIN

## 2012-04-28 LAB — BASIC METABOLIC PANEL
Calcium: 9.5 mg/dL (ref 8.4–10.5)
GFR calc non Af Amer: 28 mL/min — ABNORMAL LOW (ref >90)
Glucose, Bld: 143 mg/dL — ABNORMAL HIGH (ref 70–99)
Sodium: 141 mEq/L (ref 135–145)

## 2012-04-28 SURGERY — EXCISION, DIVERTICULUM, URETHRA
Anesthesia: General | Wound class: Dirty or Infected

## 2012-04-28 MED ORDER — CEFAZOLIN SODIUM 1-5 GM-% IV SOLN: 1.0000 g | INTRAVENOUS | Status: DC

## 2012-04-28 MED ORDER — HYOSCYAMINE SULFATE 0.125 MG SL SUBL
0.1250 mg | SUBLINGUAL_TABLET | SUBLINGUAL | Status: DC | PRN
  Filled 2012-04-28: qty 1

## 2012-04-28 MED ORDER — LACTATED RINGERS IV SOLN
INTRAVENOUS | Status: DC
  Administered 2012-04-28: 06:00:00 via INTRAVENOUS

## 2012-04-28 MED ORDER — ROCURONIUM BROMIDE 100 MG/10ML IV SOLN
INTRAVENOUS | Status: DC | PRN
  Administered 2012-04-28: 20 mg via INTRAVENOUS

## 2012-04-28 MED ORDER — PROPOFOL 10 MG/ML IV BOLUS
INTRAVENOUS | Status: DC | PRN
  Administered 2012-04-28: 150 mg via INTRAVENOUS

## 2012-04-28 MED ORDER — SODIUM CHLORIDE 0.9 % IJ SOLN: 3.0000 mL | Freq: Two times a day (BID) | INTRAMUSCULAR | Status: DC

## 2012-04-28 MED ORDER — NITROFURANTOIN MONOHYD MACRO 100 MG PO CAPS
100.0000 mg | ORAL_CAPSULE | Freq: Every day | ORAL | Status: DC
  Filled 2012-04-28 (×2): qty 1

## 2012-04-28 MED ORDER — STERILE WATER FOR IRRIGATION IR SOLN
Status: DC | PRN
  Administered 2012-04-28: 3000 mL via INTRAVESICAL

## 2012-04-28 MED ORDER — TRAMADOL HCL 50 MG PO TABS
100.0000 mg | ORAL_TABLET | Freq: Two times a day (BID) | ORAL | Status: DC
  Administered 2012-04-28 – 2012-04-29 (×3): 100 mg via ORAL
  Filled 2012-04-28 (×4): qty 2

## 2012-04-28 MED ORDER — 0.9 % SODIUM CHLORIDE (POUR BTL) OPTIME
TOPICAL | Status: DC | PRN
  Administered 2012-04-28: 1000 mL

## 2012-04-28 MED ORDER — SODIUM CHLORIDE 0.9 % IJ SOLN: 3.0000 mL | INTRAMUSCULAR | Status: DC | PRN

## 2012-04-28 MED ORDER — FENTANYL CITRATE 0.05 MG/ML IJ SOLN
INTRAMUSCULAR | Status: DC | PRN
  Administered 2012-04-28: 50 ug via INTRAVENOUS

## 2012-04-28 MED ORDER — ESTRADIOL 0.1 MG/GM VA CREA
TOPICAL_CREAM | VAGINAL | Status: AC
  Filled 2012-04-28: qty 85

## 2012-04-28 MED ORDER — LEVOTHYROXINE SODIUM 112 MCG PO TABS
224.0000 ug | ORAL_TABLET | Freq: Every day | ORAL | Status: DC
  Administered 2012-04-29: 224 ug via ORAL
  Filled 2012-04-28 (×2): qty 2

## 2012-04-28 MED ORDER — LIDOCAINE-EPINEPHRINE 1 %-1:100000 IJ SOLN
INTRAMUSCULAR | Status: DC | PRN
  Administered 2012-04-28: 17 mL

## 2012-04-28 MED ORDER — SODIUM CHLORIDE 0.9 % IV SOLN: 250.0000 mL | INTRAVENOUS | Status: DC

## 2012-04-28 MED ORDER — AMLODIPINE BESYLATE 10 MG PO TABS
10.0000 mg | ORAL_TABLET | Freq: Every day | ORAL | Status: DC
  Administered 2012-04-29: 10 mg via ORAL
  Filled 2012-04-28: qty 1

## 2012-04-28 MED ORDER — FENTANYL 50 MCG/HR TD PT72: 50.0000 ug | MEDICATED_PATCH | TRANSDERMAL | Status: DC

## 2012-04-28 MED ORDER — ACETAMINOPHEN 325 MG PO TABS: 650.0000 mg | ORAL_TABLET | ORAL | Status: DC | PRN

## 2012-04-28 MED ORDER — SIROLIMUS 1 MG PO TABS
2.0000 mg | ORAL_TABLET | Freq: Every day | ORAL | Status: DC
  Administered 2012-04-29: 2 mg via ORAL
  Filled 2012-04-28: qty 2

## 2012-04-28 MED ORDER — SUCCINYLCHOLINE CHLORIDE 20 MG/ML IJ SOLN
INTRAMUSCULAR | Status: DC | PRN
  Administered 2012-04-28: 100 mg via INTRAVENOUS

## 2012-04-28 MED ORDER — PREDNISONE 10 MG PO TABS
10.0000 mg | ORAL_TABLET | Freq: Every day | ORAL | Status: DC
  Administered 2012-04-29: 10 mg via ORAL
  Filled 2012-04-28: qty 1

## 2012-04-28 MED ORDER — LACTATED RINGERS IV SOLN: INTRAVENOUS | Status: DC

## 2012-04-28 MED ORDER — FENTANYL 12 MCG/HR TD PT72: 12.5000 ug | MEDICATED_PATCH | TRANSDERMAL | Status: DC

## 2012-04-28 MED ORDER — MAGNESIUM OXIDE 400 (241.3 MG) MG PO TABS
400.0000 mg | ORAL_TABLET | Freq: Every day | ORAL | Status: DC
  Administered 2012-04-29: 400 mg via ORAL
  Filled 2012-04-28: qty 1

## 2012-04-28 MED ORDER — INSULIN ASPART 100 UNIT/ML ~~LOC~~ SOLN: 0.0000 [IU] | Freq: Three times a day (TID) | SUBCUTANEOUS | Status: DC

## 2012-04-28 MED ORDER — MORPHINE SULFATE 2 MG/ML IJ SOLN
INTRAMUSCULAR | Status: AC
  Filled 2012-04-28: qty 1

## 2012-04-28 MED ORDER — FAMOTIDINE 20 MG PO TABS
20.0000 mg | ORAL_TABLET | Freq: Every day | ORAL | Status: DC
  Administered 2012-04-29: 20 mg via ORAL
  Filled 2012-04-28: qty 1

## 2012-04-28 MED ORDER — POTASSIUM CHLORIDE CRYS ER 20 MEQ PO TBCR
20.0000 meq | EXTENDED_RELEASE_TABLET | Freq: Every day | ORAL | Status: DC
  Filled 2012-04-28: qty 1

## 2012-04-28 MED ORDER — CALCITRIOL 0.5 MCG PO CAPS
0.5000 ug | ORAL_CAPSULE | Freq: Every day | ORAL | Status: DC
  Administered 2012-04-28 – 2012-04-29 (×2): 0.5 ug via ORAL
  Filled 2012-04-28 (×2): qty 1

## 2012-04-28 MED ORDER — INSULIN DETEMIR 100 UNIT/ML ~~LOC~~ SOLN
28.0000 [IU] | Freq: Every day | SUBCUTANEOUS | Status: DC
  Administered 2012-04-29: 28 [IU] via SUBCUTANEOUS
  Filled 2012-04-28: qty 10

## 2012-04-28 MED ORDER — LACTATED RINGERS IV SOLN: INTRAVENOUS | Status: DC | PRN

## 2012-04-28 MED ORDER — ALLOPURINOL 150 MG HALF TABLET
150.0000 mg | ORAL_TABLET | Freq: Every day | ORAL | Status: DC
  Administered 2012-04-29: 150 mg via ORAL
  Filled 2012-04-28 (×2): qty 1

## 2012-04-28 MED ORDER — CALCIUM CARBONATE ANTACID 500 MG PO CHEW
2.0000 | CHEWABLE_TABLET | Freq: Three times a day (TID) | ORAL | Status: DC
  Administered 2012-04-28 – 2012-04-29 (×2): 400 mg via ORAL
  Filled 2012-04-28 (×5): qty 2

## 2012-04-28 MED ORDER — ONDANSETRON HCL 4 MG/2ML IJ SOLN
INTRAMUSCULAR | Status: DC | PRN
  Administered 2012-04-28: 4 mg via INTRAVENOUS

## 2012-04-28 MED ORDER — SODIUM CHLORIDE 0.9 % IV SOLN
INTRAVENOUS | Status: DC
  Administered 2012-04-28: 13:00:00 via INTRAVENOUS

## 2012-04-28 MED ORDER — GABAPENTIN 400 MG PO CAPS
400.0000 mg | ORAL_CAPSULE | Freq: Three times a day (TID) | ORAL | Status: DC
  Administered 2012-04-28 – 2012-04-29 (×3): 400 mg via ORAL
  Filled 2012-04-28 (×5): qty 1

## 2012-04-28 MED ORDER — CIPROFLOXACIN IN D5W 400 MG/200ML IV SOLN
400.0000 mg | Freq: Two times a day (BID) | INTRAVENOUS | Status: DC
  Administered 2012-04-28 – 2012-04-29 (×2): 400 mg via INTRAVENOUS
  Filled 2012-04-28 (×2): qty 200

## 2012-04-28 MED ORDER — CIPROFLOXACIN IN D5W 400 MG/200ML IV SOLN: 400.0000 mg | INTRAVENOUS | Status: DC

## 2012-04-28 MED ORDER — LIDOCAINE-EPINEPHRINE 1 %-1:100000 IJ SOLN
INTRAMUSCULAR | Status: AC
  Filled 2012-04-28: qty 1

## 2012-04-28 MED ORDER — SODIUM CHLORIDE 0.9 % IR SOLN
Status: DC | PRN
  Administered 2012-04-28 (×2)

## 2012-04-28 MED ORDER — INSULIN ASPART 100 UNIT/ML ~~LOC~~ SOLN
0.0000 [IU] | Freq: Every day | SUBCUTANEOUS | Status: DC
  Administered 2012-04-28: 5 [IU] via SUBCUTANEOUS

## 2012-04-28 MED ORDER — MORPHINE SULFATE 2 MG/ML IJ SOLN
2.0000 mg | INTRAMUSCULAR | Status: DC | PRN
  Administered 2012-04-28: 2 mg via INTRAVENOUS

## 2012-04-28 MED ORDER — DEXTROSE 50 % IV SOLN
12.5000 g | Freq: Once | INTRAVENOUS | Status: AC
  Administered 2012-04-28: 12.5 g via INTRAVENOUS

## 2012-04-28 MED ORDER — FENTANYL CITRATE 0.05 MG/ML IJ SOLN: 25.0000 ug | INTRAMUSCULAR | Status: DC | PRN

## 2012-04-28 MED ORDER — VITAMIN D (ERGOCALCIFEROL) 1.25 MG (50000 UNIT) PO CAPS
50000.0000 [IU] | ORAL_CAPSULE | ORAL | Status: DC
  Administered 2012-04-29: 50000 [IU] via ORAL
  Filled 2012-04-28: qty 1

## 2012-04-28 MED ORDER — MYCOPHENOLATE MOFETIL 250 MG PO CAPS
500.0000 mg | ORAL_CAPSULE | Freq: Two times a day (BID) | ORAL | Status: DC
  Administered 2012-04-28 – 2012-04-29 (×3): 500 mg via ORAL
  Filled 2012-04-28 (×4): qty 2

## 2012-04-28 MED ORDER — INSULIN ASPART 100 UNIT/ML ~~LOC~~ SOLN
0.0000 [IU] | Freq: Three times a day (TID) | SUBCUTANEOUS | Status: DC
  Administered 2012-04-28: 6 [IU] via SUBCUTANEOUS
  Administered 2012-04-29: 8 [IU] via SUBCUTANEOUS

## 2012-04-28 MED ORDER — LATANOPROST 0.005 % OP SOLN
1.0000 [drp] | Freq: Every day | OPHTHALMIC | Status: DC
  Administered 2012-04-28: 1 [drp] via OPHTHALMIC
  Filled 2012-04-28: qty 2.5

## 2012-04-28 MED ORDER — FUROSEMIDE 40 MG PO TABS
40.0000 mg | ORAL_TABLET | Freq: Two times a day (BID) | ORAL | Status: DC
  Administered 2012-04-28 – 2012-04-29 (×3): 40 mg via ORAL
  Filled 2012-04-28 (×4): qty 1

## 2012-04-28 MED ORDER — DEXAMETHASONE SODIUM PHOSPHATE 10 MG/ML IJ SOLN
INTRAMUSCULAR | Status: DC | PRN
  Administered 2012-04-28: 10 mg via INTRAVENOUS

## 2012-04-28 MED ORDER — EPHEDRINE SULFATE 50 MG/ML IJ SOLN
INTRAMUSCULAR | Status: DC | PRN
  Administered 2012-04-28: 15 mg via INTRAVENOUS
  Administered 2012-04-28: 10 mg via INTRAVENOUS

## 2012-04-28 MED ORDER — LABETALOL HCL 300 MG PO TABS
300.0000 mg | ORAL_TABLET | Freq: Three times a day (TID) | ORAL | Status: DC
  Administered 2012-04-28 – 2012-04-29 (×3): 300 mg via ORAL
  Filled 2012-04-28 (×5): qty 1

## 2012-04-28 MED ORDER — CIPROFLOXACIN IN D5W 400 MG/200ML IV SOLN
INTRAVENOUS | Status: DC | PRN
  Administered 2012-04-28: 400 mg via INTRAVENOUS

## 2012-04-28 MED ORDER — CIPROFLOXACIN IN D5W 400 MG/200ML IV SOLN
INTRAVENOUS | Status: AC
  Filled 2012-04-28: qty 200

## 2012-04-28 MED ORDER — ENOXAPARIN SODIUM 30 MG/0.3ML ~~LOC~~ SOLN
30.0000 mg | SUBCUTANEOUS | Status: DC
  Administered 2012-04-28: 30 mg via SUBCUTANEOUS
  Filled 2012-04-28 (×2): qty 0.3

## 2012-04-28 MED ORDER — LIDOCAINE HCL (CARDIAC) 20 MG/ML IV SOLN
INTRAVENOUS | Status: DC | PRN
  Administered 2012-04-28: 50 mg via INTRAVENOUS

## 2012-04-28 SURGICAL SUPPLY — 46 items
BAG URINE DRAINAGE (UROLOGICAL SUPPLIES) ×2 IMPLANT
BLADE HEX COATED 2.75 (ELECTRODE) ×2 IMPLANT
BLADE SURG 15 STRL LF DISP TIS (BLADE) ×2 IMPLANT
BLADE SURG 15 STRL SS (BLADE) ×2
BLADE SURG SZ10 CARB STEEL (BLADE) ×2 IMPLANT
CATH EMB 4FR 80CM (CATHETERS) IMPLANT
CATH EMB 5FR 80CM (CATHETERS) ×2 IMPLANT
CATH FOLEY 2WAY  3CC  8FR (CATHETERS) ×1
CATH FOLEY 2WAY 3CC 8FR (CATHETERS) ×1 IMPLANT
CATH FOLEY 2WAY SLVR  5CC 16FR (CATHETERS) ×1
CATH FOLEY 2WAY SLVR 5CC 16FR (CATHETERS) ×1 IMPLANT
CLOTH BEACON ORANGE TIMEOUT ST (SAFETY) ×2 IMPLANT
COVER MAYO STAND STRL (DRAPES) IMPLANT
DECANTER SPIKE VIAL GLASS SM (MISCELLANEOUS) IMPLANT
DRAIN PENROSE 18X1/4 LTX STRL (WOUND CARE) ×2 IMPLANT
GAUZE PACKING 2X5 YD STERILE (GAUZE/BANDAGES/DRESSINGS) ×2 IMPLANT
GAUZE SPONGE 4X4 16PLY XRAY LF (GAUZE/BANDAGES/DRESSINGS) ×4 IMPLANT
GLOVE BIOGEL M 8.0 STRL (GLOVE) ×4 IMPLANT
GLOVE BIOGEL M STRL SZ7.5 (GLOVE) ×4 IMPLANT
GOWN STRL REIN XL XLG (GOWN DISPOSABLE) ×6 IMPLANT
HOLDER FOLEY CATH W/STRAP (MISCELLANEOUS) IMPLANT
KIT BASIN OR (CUSTOM PROCEDURE TRAY) ×2 IMPLANT
NEEDLE HYPO 22GX1.5 SAFETY (NEEDLE) ×2 IMPLANT
PACK CYSTO (CUSTOM PROCEDURE TRAY) ×2 IMPLANT
PENCIL BUTTON HOLSTER BLD 10FT (ELECTRODE) ×2 IMPLANT
PLUG CATH AND CAP STER (CATHETERS) ×2 IMPLANT
RETRACTOR STAY HOOK 5MM (MISCELLANEOUS) ×2 IMPLANT
SHEET LAVH (DRAPES) ×2 IMPLANT
SPONGE LAP 4X18 X RAY DECT (DISPOSABLE) ×2 IMPLANT
SUT SILK 2 0 SH (SUTURE) ×2 IMPLANT
SUT SILK 3 0 SH CR/8 (SUTURE) ×2 IMPLANT
SUT VIC AB 2-0 SH 27 (SUTURE) ×2
SUT VIC AB 2-0 SH 27X BRD (SUTURE) ×2 IMPLANT
SUT VIC AB 3-0 SH 27 (SUTURE) ×5
SUT VIC AB 3-0 SH 27XBRD (SUTURE) ×5 IMPLANT
SUT VIC AB 4-0 RB1 27 (SUTURE) ×1
SUT VIC AB 4-0 RB1 27XBRD (SUTURE) ×1 IMPLANT
SYR CONTROL 10ML LL (SYRINGE) ×2 IMPLANT
SYRINGE 10CC LL (SYRINGE) ×2 IMPLANT
TOWEL OR 17X26 10 PK STRL BLUE (TOWEL DISPOSABLE) ×2 IMPLANT
TOWEL OR NON WOVEN STRL DISP B (DISPOSABLE) ×2 IMPLANT
TUBE ANAEROBIC SPECIMEN COL (MISCELLANEOUS) ×2 IMPLANT
TUBING CONNECTING 10 (TUBING) ×2 IMPLANT
WATER STERILE IRR 1000ML UROMA (IV SOLUTION) ×2 IMPLANT
WATER STERILE IRR 1500ML POUR (IV SOLUTION) IMPLANT
YANKAUER SUCT BULB TIP 10FT TU (MISCELLANEOUS) ×2 IMPLANT

## 2012-04-28 NOTE — Progress Notes (Signed)
ANTIBIOTIC CONSULT NOTE   Pharmacy Consult for Cipro Indication: urethral diverticulum  Allergies  Allergen Reactions  . Adhesive (Tape)   . Aspirin     Crawling feeling under skin  . Hydrocodone Other (See Comments)    Hallucinations   . Iohexol      Desc: pt unable to tell what the reaction was   . Oxycodone   . Penicillins Hives and Swelling  . Tacrolimus Other (See Comments)    Hallucinations   . Contrast Media (Iodinated Diagnostic Agents) Hives, Swelling and Rash    Patient Measurements:   Last known weight 73.5 kg (03/2012)   Labs:  Basename 04/28/12 1126  WBC 7.2  HGB 11.7*  PLT 229  LABCREA --  CREATININE 1.71*   The CrCl is unknown because both a height and weight (above a minimum accepted value) are required for this calculation. 7/9 CrCl (n) ~ 33 ml/min  Medications:  Anti-infectives     Start     Dose/Rate Route Frequency Ordered Stop   04/29/12 0500   ciprofloxacin (CIPRO) IVPB 400 mg        400 mg 200 mL/hr over 60 Minutes Intravenous Every 24 hours 04/28/12 1252     04/28/12 0825   polymyxin B 500,000 Units, bacitracin 50,000 Units in sodium chloride irrigation 0.9 % 500 mL irrigation  Status:  Discontinued          As needed 04/28/12 0825 04/28/12 1014   04/28/12 0638   ceFAZolin (ANCEF) IVPB 1 g/50 mL premix  Status:  Discontinued        1 g 100 mL/hr over 30 Minutes Intravenous 30 min pre-op 04/28/12 7564 04/28/12 1241         Assessment:  71 YOF with history of recurrent UTI, urosepsis previously maintained on prophylactic nitrofurantoin.  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   **MD:   Recommend d/c prophylactic nitrofurantoin while pt is on active treatment with Cipro.  Also, note that nitrofurantoin is contraindicated with CrCl < 60 ml/min due to variability in drug concentrations.  Note that Cipro may decrease concentration of Cellcept.   Lynann Beaver PharmD, BCPS Pager [phone] 04/28/2012 1:18 PM

## 2012-04-28 NOTE — Progress Notes (Signed)
Patient ID: [name], female   DOB: Nov 26, 1940, 71 y.o.   MRN: [id number]  Pt without complaint except she does not want to be on a renal diet. She said Dr. Eliott Nine lets her consume a regular diet - "bananas, tomatoes", etc.   PE: Sleeping, but easily arousable. A&O x 3 NAD foley - good clear UOP  Impression: s/p large urethral diverticulectomy  Plan: -SLIV -Regular diet

## 2012-04-28 NOTE — Anesthesia Procedure Notes (Signed)
Procedure Name: Intubation Date/Time: 04/28/2012 7:27 AM Performed by: Doran Clay Pre-anesthesia Checklist: Patient identified, Timeout performed, Emergency Drugs available, Suction available and Patient being monitored Patient Re-evaluated:Patient Re-evaluated prior to inductionOxygen Delivery Method: Circle system utilized Preoxygenation: Pre-oxygenation with 100% oxygen Intubation Type: IV induction Laryngoscope Size: Mac and 4 Grade View: Grade I Tube type: Oral Tube size: 7.0 mm Number of attempts: 1 Airway Equipment and Method: Stylet Placement Confirmation: ETT inserted through vocal cords under direct vision,  breath sounds checked- equal and bilateral and positive ETCO2 Secured at: 21 cm Tube secured with: Tape Dental Injury: Teeth and Oropharynx as per pre-operative assessment

## 2012-04-28 NOTE — Anesthesia Postprocedure Evaluation (Signed)
  Anesthesia Post-op Note  Patient: [name]  Procedure(s) Performed: Procedure(s) (LRB): URETHRAL DIVERTICULECTOMY (N/A)  Patient Location: PACU  Anesthesia Type: General  Level of Consciousness: awake and alert   Airway and Oxygen Therapy: Patient Spontanous Breathing  Post-op Pain: mild  Post-op Assessment: Post-op Vital signs reviewed, Patient's Cardiovascular Status Stable, Respiratory Function Stable, Patent Airway and No signs of Nausea or vomiting  Post-op Vital Signs: stable  Complications: No apparent anesthesia complications

## 2012-04-28 NOTE — Brief Op Note (Addendum)
04/28/2012  10:15 AM  PATIENT:  [name]  71 y.o. female  PRE-OPERATIVE DIAGNOSIS:  urethral diverticulum  POST-OPERATIVE DIAGNOSIS:  urethral diverticulum  PROCEDURE:  Procedure(s) (LRB): URETHRAL DIVERTICULECTOMY (N/A)  SURGEON:  Surgeon(s) and Role:    * Antony Haste, MD - Primary    * Martina Sinner, MD - Assisting  ANESTHESIA:   general  Findings - exam under anesthesia - large cystic urethral mass with thick purulent drainage Cystoscopy - large ostia at mid-left urethra with purulent drainage;  Cystocele; trigone and native UO's normal and bladder normal after all dissection prior to closure. Transplant UO appeared to be right dome/superior side wall.   EBL:  Total I/O In: 600 [I.V.:600] Out: 50 [Blood:50]  BLOOD ADMINISTERED:none  DRAINS: Urinary Catheter (Foley) 16 Fr foley  LOCAL MEDICATIONS USED:  NONE  SPECIMEN:  1) wound (diverticulum) culture/GS, 2) urethral diverticulum- partial excision  DISPOSITION OF SPECIMEN: lab/ PATHOLOGY  COUNTS:  YES  DICTATION: .Other Dictation: Dictation Number other  PLAN OF CARE: Admit for overnight observation  PATIENT DISPOSITION:  PACU - hemodynamically stable.   Delay start of Pharmacological VTE agent (>24hrs) due to surgical blood loss or risk of bleeding: no  [id number]

## 2012-04-28 NOTE — Transfer of Care (Signed)
Immediate Anesthesia Transfer of Care Note  Patient: [name]  Procedure(s) Performed: Procedure(s) (LRB): URETHRAL DIVERTICULECTOMY (N/A)  Patient Location: PACU  Anesthesia Type: General  Level of Consciousness: sedated  Airway & Oxygen Therapy: Patient Spontanous Breathing and Patient connected to face mask oxygen  Post-op Assessment: Report given to PACU RN and Post -op Vital signs reviewed and stable  Post vital signs: Reviewed and stable  Complications: No apparent anesthesia complications

## 2012-04-29 ENCOUNTER — Encounter (HOSPITAL_COMMUNITY): Payer: Self-pay | Admitting: Urology

## 2012-04-29 LAB — BASIC METABOLIC PANEL
BUN: 33 mg/dL — ABNORMAL HIGH (ref 6–23)
CO2: 34 mEq/L — ABNORMAL HIGH (ref 19–32)
Chloride: 90 mEq/L — ABNORMAL LOW (ref 96–112)
Glucose, Bld: 300 mg/dL — ABNORMAL HIGH (ref 70–99)
Potassium: 3.7 mEq/L (ref 3.5–5.1)
Sodium: 135 mEq/L (ref 135–145)

## 2012-04-29 LAB — CBC
HCT: 33.6 % — ABNORMAL LOW (ref 36.0–46.0)
Hemoglobin: 10.3 g/dL — ABNORMAL LOW (ref 12.0–15.0)
MCH: 26 pg (ref 26.0–34.0)
MCHC: 30.7 g/dL (ref 30.0–36.0)
RBC: 3.96 MIL/uL (ref 3.87–5.11)

## 2012-04-29 LAB — GLUCOSE, CAPILLARY

## 2012-04-29 MED ORDER — TRAMADOL HCL 50 MG PO TABS: 50.0000 mg | ORAL_TABLET | Freq: Four times a day (QID) | ORAL | Status: DC | PRN

## 2012-04-29 MED ORDER — CIPROFLOXACIN HCL 250 MG PO TABS: 250.0000 mg | ORAL_TABLET | Freq: Two times a day (BID) | ORAL | Status: DC

## 2012-04-29 NOTE — Op Note (Signed)
[name], [name] NO.:  [id number]  MEDICAL RECORD NO.:  [id number]  LOCATION:  [facility]                         FACILITY:  [facility]  PHYSICIAN:  Jerilee Field, MD   DATE OF BIRTH:  1941/06/18  DATE OF PROCEDURE: DATE OF DISCHARGE:                              OPERATIVE REPORT   PREOPERATIVE DIAGNOSIS:  Urethral diverticulum.  POSTOPERATIVE DIAGNOSIS:  Urethral diverticulum.  PROCEDURE:  Urethral diverticulectomy and cystoscopy.  SURGEON:  Jerilee Field, MD  ASSISTANT SURGEON:  Martina Sinner, MD  TYPE OF ANESTHESIA:  General.  INDICATION FOR PROCEDURE:  Ms. Bobst is a 71 year old female with recurrent urinary tract infection and urosepsis.  My evaluation revealed a large complex urethral diverticulum.  I discussed with the patient and her husband the nature, risks, benefits, and alternatives to urethral diverticulectomy.  All questions answered.  They elected to proceed.  FINDINGS:  On exam under anesthesia, the transplant was in the right lower quadrant.  Otherwise there were no abdominal masses.  On bimanual exam, there was a cystic mass under the mid urethra with purulent drainage that was thick from the urethra.  There was a stage II cystocele.  Otherwise, the vagina was normal.  On cystoscopy, there was a large ostia on the left mid urethra with purulent drainage.  The bladder was inspected and noted to be free of stone or tumor.  The transplant ureteral orifice appeared to be up on the right superior dome of the bladder.  The trigone and the native ureteral orifices were all normal.  After all dissection, the urethra and bladder were scoped again and noted to be free of injury.  The trigone and ureteral orifices native were normal as well.  DESCRIPTION OF PROCEDURE:  After consent was obtained, the patient was brought to the operating room.  Time-out was performed to confirm the patient and procedure.  She was then placed in lithotomy  position with all pressure points padded.  SCDs were in place and she was given Cipro preoperatively.  An exam under anesthesia was performed in the lower abdomen and external genitalia and vagina were prepped and draped in the usual fashion.  Cystoscopy was undertaken and a Fogarty balloon catheter was placed in the diverticulum and the balloon inflated.  A 16-French Foley was placed into the bladder and it was drained.  A U-shaped incision was made after infiltrating the anterior vaginal wall with Lidocaine and Marcaine.  The vaginal wall was carefully dissected off the periurethral fascia back to the proximal urethra.  Next,  the periurethral fascia was incised going from left to right and a healthy flap of periurethral fascia was made going proximally toward the bladder neck and another flap going distally toward the urethral meatus.  Underneath the urethral fascia was a large cystic structure, which was carefully dissected off the fascia. Again the flaps were healthy with a good blood supply.  The urethral diverticulum was dissected out to the right and to the left proximally and distally and then opened in the midline.  It was a thick diverticulum and it was transected and removed, leaving some of the diverticulum to invert and close on the  urethra.  There was also an edge of the diverticulum left superiorly on the right side.  The diverticulum excised was sent to pathology.  The Fogarty catheter was visualized going from the urethra into another smaller diverticulum but this was very superior and we felt it best to leave this small diverticulum in place in order to have adequate tissue to close the urethra. The bulk of the diverticulum was removed.  When the diverticulum was opened, copious pus was drained and cultures were obtained.  Gram stain revealed rare gram-negative rods and rare white blood cells.  Cystoscopy was then performed again to evaluate the urethra and bladder as above  and all structures were noted to be normal.  The cystoscope was removed but the cystoscope sheath was then placed back in the urethra to mark the urethra.  The opening to the urethra through the diverticulum was visualized.  A small diverticulum where the Fogarty was left in place and the Fogarty was removed.  Using a 3-0 Vicryl, the urethra was then run closed going from proximal to distal urethra.  Next, the periurethral fascia was closed in a pants-over-vest arrangement with the distal flap tucked down under the urethra to cover the urethral closure. Next the proximal periurethral flap was closed out it stretched out to toward the urethral meatus. Prior to any of the closures, the wounds were copiously irrigated with GU irrigant. The cystoscope sheath was removed and a 16-French Foley was placed.  The balloon was inflated and seated at the bladder neck and it was left to gravity drainage.  The urine was clear.  The vaginal flap was then run closed with 2-0 Vicryl suture, going from the right side to the midline and then the left side to the midline.  The patient was then cleaned up and a vaginal packing with Estrace cream was placed.  The Foley was left to gravity.  She was then taken out of lithotomy and awakened and returned to the recovery room in stable condition.  ESTIMATED BLOOD LOSS:  50 mL.  COMPLICATIONS:  None.  DRAINS:  A 16-French Foley.  SPECIMENS: 1. Wound culture and Gram stain 2. Urethral diverticulum, partial excision.  DISPOSITION: 1. To the lab. 2. To the pathology.  Counts were  correct.  The patient's disposition stable to PACU.  She will be admitted for overnight observation.          ______________________________ Jerilee Field, MD     ME/MEDQ  D:  04/28/2012  T:  04/28/2012  Job:  [id number]

## 2012-04-30 ENCOUNTER — Emergency Department (HOSPITAL_COMMUNITY): Payer: Medicare Other

## 2012-04-30 ENCOUNTER — Inpatient Hospital Stay (HOSPITAL_COMMUNITY)
Admission: EM | Admit: 2012-04-30 | Discharge: 2012-05-03 | DRG: 871 | Disposition: A | Discharge status: Home / Self Care | Payer: Medicare Other | Attending: Urology | Admitting: Urology

## 2012-04-30 ENCOUNTER — Encounter (HOSPITAL_COMMUNITY): Payer: Self-pay | Admitting: Emergency Medicine

## 2012-04-30 ENCOUNTER — Encounter (HOSPITAL_COMMUNITY): Payer: Medicare Other

## 2012-04-30 DIAGNOSIS — N183 Chronic kidney disease, stage 3 unspecified: Secondary | ICD-10-CM | POA: Diagnosis present

## 2012-04-30 DIAGNOSIS — IMO0002 Reserved for concepts with insufficient information to code with codable children: Secondary | ICD-10-CM | POA: Diagnosis present

## 2012-04-30 DIAGNOSIS — N390 Urinary tract infection, site not specified: Secondary | ICD-10-CM | POA: Diagnosis present

## 2012-04-30 DIAGNOSIS — F29 Unspecified psychosis not due to a substance or known physiological condition: Secondary | ICD-10-CM

## 2012-04-30 DIAGNOSIS — R011 Cardiac murmur, unspecified: Secondary | ICD-10-CM | POA: Diagnosis present

## 2012-04-30 DIAGNOSIS — R410 Disorientation, unspecified: Secondary | ICD-10-CM

## 2012-04-30 DIAGNOSIS — E1165 Type 2 diabetes mellitus with hyperglycemia: Secondary | ICD-10-CM | POA: Diagnosis present

## 2012-04-30 DIAGNOSIS — N189 Chronic kidney disease, unspecified: Secondary | ICD-10-CM

## 2012-04-30 DIAGNOSIS — N289 Disorder of kidney and ureter, unspecified: Secondary | ICD-10-CM

## 2012-04-30 DIAGNOSIS — G9341 Metabolic encephalopathy: Secondary | ICD-10-CM | POA: Diagnosis present

## 2012-04-30 DIAGNOSIS — I10 Essential (primary) hypertension: Secondary | ICD-10-CM | POA: Diagnosis present

## 2012-04-30 LAB — URINALYSIS, ROUTINE W REFLEX MICROSCOPIC
Bilirubin Urine: NEGATIVE
Ketones, ur: NEGATIVE mg/dL
Nitrite: NEGATIVE
Protein, ur: 100 mg/dL — AB
Urobilinogen, UA: 0.2 mg/dL (ref 0.0–1.0)

## 2012-04-30 LAB — CBC WITH DIFFERENTIAL/PLATELET
Basophils Relative: 0 % (ref 0–1)
Eosinophils Absolute: 0 10*3/uL (ref 0.0–0.7)
Eosinophils Relative: 0 % (ref 0–5)
Hemoglobin: 11.3 g/dL — ABNORMAL LOW (ref 12.0–15.0)
Lymphs Abs: 0.7 10*3/uL (ref 0.7–4.0)
MCH: 26.3 pg (ref 26.0–34.0)
MCHC: 30 g/dL (ref 30.0–36.0)
MCV: 87.7 fL (ref 78.0–100.0)
Monocytes Relative: 10 % (ref 3–12)
Neutrophils Relative %: 82 % — ABNORMAL HIGH (ref 43–77)
RBC: 4.3 MIL/uL (ref 3.87–5.11)

## 2012-04-30 LAB — BASIC METABOLIC PANEL
BUN: 37 mg/dL — ABNORMAL HIGH (ref 6–23)
Calcium: 9.6 mg/dL (ref 8.4–10.5)
Creatinine, Ser: 2.21 mg/dL — ABNORMAL HIGH (ref 0.50–1.10)
GFR calc Af Amer: 24 mL/min — ABNORMAL LOW (ref >90)
GFR calc non Af Amer: 21 mL/min — ABNORMAL LOW (ref >90)
Glucose, Bld: 181 mg/dL — ABNORMAL HIGH (ref 70–99)

## 2012-04-30 LAB — GLUCOSE, CAPILLARY: Glucose-Capillary: 212 mg/dL — ABNORMAL HIGH (ref 70–99)

## 2012-04-30 LAB — PROCALCITONIN: Procalcitonin: 0.98 ng/mL

## 2012-04-30 LAB — WOUND CULTURE: Culture: NO GROWTH

## 2012-04-30 LAB — URINE MICROSCOPIC-ADD ON

## 2012-04-30 MED ORDER — LEVOTHYROXINE SODIUM 112 MCG PO TABS
224.0000 ug | ORAL_TABLET | Freq: Every day | ORAL | Status: DC
  Administered 2012-05-01 – 2012-05-03 (×3): 224 ug via ORAL
  Filled 2012-04-30 (×6): qty 2

## 2012-04-30 MED ORDER — LABETALOL HCL 300 MG PO TABS
300.0000 mg | ORAL_TABLET | Freq: Three times a day (TID) | ORAL | Status: DC
  Administered 2012-04-30 – 2012-05-03 (×8): 300 mg via ORAL
  Filled 2012-04-30 (×10): qty 1

## 2012-04-30 MED ORDER — FENTANYL 50 MCG/HR TD PT72
50.0000 ug | MEDICATED_PATCH | TRANSDERMAL | Status: DC
  Administered 2012-04-30: 50 ug via TRANSDERMAL
  Filled 2012-04-30: qty 1

## 2012-04-30 MED ORDER — CALCITRIOL 0.5 MCG PO CAPS
0.5000 ug | ORAL_CAPSULE | Freq: Every day | ORAL | Status: DC
  Administered 2012-04-30 – 2012-05-03 (×4): 0.5 ug via ORAL
  Filled 2012-04-30 (×4): qty 1

## 2012-04-30 MED ORDER — CALCIUM CARBONATE ANTACID 500 MG PO CHEW
2.0000 | CHEWABLE_TABLET | Freq: Three times a day (TID) | ORAL | Status: DC
  Administered 2012-05-01 – 2012-05-03 (×7): 400 mg via ORAL
  Filled 2012-04-30 (×11): qty 2

## 2012-04-30 MED ORDER — PREDNISONE 10 MG PO TABS
10.0000 mg | ORAL_TABLET | Freq: Every day | ORAL | Status: DC
  Administered 2012-05-01 – 2012-05-03 (×3): 10 mg via ORAL
  Filled 2012-04-30 (×5): qty 1

## 2012-04-30 MED ORDER — INSULIN ASPART 100 UNIT/ML ~~LOC~~ SOLN
0.0000 [IU] | Freq: Three times a day (TID) | SUBCUTANEOUS | Status: DC
  Administered 2012-05-01: 5 [IU] via SUBCUTANEOUS
  Administered 2012-05-01: 2 [IU] via SUBCUTANEOUS
  Administered 2012-05-01: 3 [IU] via SUBCUTANEOUS
  Administered 2012-05-02: 2 [IU] via SUBCUTANEOUS
  Administered 2012-05-02: 3 [IU] via SUBCUTANEOUS

## 2012-04-30 MED ORDER — FAMOTIDINE 20 MG PO TABS
20.0000 mg | ORAL_TABLET | Freq: Every day | ORAL | Status: DC
  Administered 2012-05-01 – 2012-05-02 (×2): 20 mg via ORAL
  Filled 2012-04-30 (×3): qty 1

## 2012-04-30 MED ORDER — PREDNISONE 5 MG PO TABS
5.0000 mg | ORAL_TABLET | Freq: Every day | ORAL | Status: DC
  Administered 2012-04-30: 5 mg via ORAL
  Filled 2012-04-30: qty 1

## 2012-04-30 MED ORDER — FUROSEMIDE 40 MG PO TABS
40.0000 mg | ORAL_TABLET | Freq: Two times a day (BID) | ORAL | Status: DC
  Administered 2012-04-30 – 2012-05-03 (×6): 40 mg via ORAL
  Filled 2012-04-30 (×8): qty 1

## 2012-04-30 MED ORDER — MYCOPHENOLATE MOFETIL 250 MG PO CAPS
500.0000 mg | ORAL_CAPSULE | Freq: Two times a day (BID) | ORAL | Status: DC
  Administered 2012-04-30 – 2012-05-03 (×6): 500 mg via ORAL
  Filled 2012-04-30 (×7): qty 2

## 2012-04-30 MED ORDER — ACETAMINOPHEN 325 MG PO TABS
650.0000 mg | ORAL_TABLET | Freq: Once | ORAL | Status: AC
  Administered 2012-04-30: 650 mg via ORAL
  Filled 2012-04-30: qty 2

## 2012-04-30 MED ORDER — MYCOPHENOLATE MOFETIL 250 MG PO CAPS: 500.0000 mg | ORAL_CAPSULE | Freq: Two times a day (BID) | ORAL | Status: DC

## 2012-04-30 MED ORDER — PHENYLEPHRINE IN HARD FAT 0.25 % RE SUPP
1.0000 | Freq: Two times a day (BID) | RECTAL | Status: DC
  Filled 2012-04-30: qty 1

## 2012-04-30 MED ORDER — SODIUM CHLORIDE 0.45 % IV SOLN: INTRAVENOUS | Status: DC

## 2012-04-30 MED ORDER — DEXTROSE 5 % IV SOLN
1.0000 g | Freq: Once | INTRAVENOUS | Status: AC
  Administered 2012-04-30: 1 g via INTRAVENOUS
  Filled 2012-04-30: qty 10

## 2012-04-30 MED ORDER — FENTANYL 12 MCG/HR TD PT72
12.5000 ug | MEDICATED_PATCH | TRANSDERMAL | Status: DC
  Administered 2012-04-30: 12.5 ug via TRANSDERMAL
  Filled 2012-04-30: qty 1

## 2012-04-30 MED ORDER — SIROLIMUS 1 MG PO TABS: 2.0000 mg | ORAL_TABLET | Freq: Every day | ORAL | Status: DC

## 2012-04-30 MED ORDER — DEXTROSE 5 % IV SOLN
1.0000 g | INTRAVENOUS | Status: DC
  Administered 2012-05-01 – 2012-05-02 (×2): 1 g via INTRAVENOUS
  Filled 2012-04-30 (×3): qty 10

## 2012-04-30 MED ORDER — MAGNESIUM OXIDE 400 (241.3 MG) MG PO TABS
400.0000 mg | ORAL_TABLET | Freq: Every day | ORAL | Status: DC
  Administered 2012-04-30 – 2012-05-03 (×4): 400 mg via ORAL
  Filled 2012-04-30 (×4): qty 1

## 2012-04-30 MED ORDER — VITAMIN D (ERGOCALCIFEROL) 1.25 MG (50000 UNIT) PO CAPS: 50000.0000 [IU] | ORAL_CAPSULE | ORAL | Status: DC

## 2012-04-30 MED ORDER — CIPROFLOXACIN IN D5W 200 MG/100ML IV SOLN: 200.0000 mg | Freq: Two times a day (BID) | INTRAVENOUS | Status: DC

## 2012-04-30 MED ORDER — LATANOPROST 0.005 % OP SOLN
1.0000 [drp] | Freq: Every day | OPHTHALMIC | Status: DC
  Administered 2012-05-01 – 2012-05-02 (×2): 1 [drp] via OPHTHALMIC
  Filled 2012-04-30: qty 2.5

## 2012-04-30 MED ORDER — MAGNESIUM OXIDE 400 MG PO TABS: 400.0000 mg | ORAL_TABLET | Freq: Every day | ORAL | Status: DC

## 2012-04-30 MED ORDER — SIROLIMUS 1 MG PO TABS
3.0000 mg | ORAL_TABLET | Freq: Every day | ORAL | Status: DC
  Administered 2012-04-30 – 2012-05-03 (×4): 3 mg via ORAL
  Filled 2012-04-30 (×4): qty 3

## 2012-04-30 MED ORDER — SODIUM CHLORIDE 0.9 % IV SOLN
750.0000 mg | INTRAVENOUS | Status: DC
  Administered 2012-04-30 – 2012-05-02 (×3): 750 mg via INTRAVENOUS
  Filled 2012-04-30 (×3): qty 750

## 2012-04-30 MED ORDER — DIPHENHYDRAMINE HCL 50 MG/ML IJ SOLN
12.5000 mg | Freq: Once | INTRAMUSCULAR | Status: AC
  Administered 2012-04-30: 12.5 mg via INTRAVENOUS
  Filled 2012-04-30: qty 1

## 2012-04-30 MED ORDER — CALCITRIOL 0.5 MCG PO CAPS: 0.5000 ug | ORAL_CAPSULE | Freq: Every day | ORAL | Status: DC

## 2012-04-30 MED ORDER — AMLODIPINE BESYLATE 10 MG PO TABS
10.0000 mg | ORAL_TABLET | Freq: Every day | ORAL | Status: DC
  Administered 2012-05-01: 10 mg via ORAL
  Filled 2012-04-30 (×2): qty 1

## 2012-04-30 MED ORDER — GABAPENTIN 400 MG PO CAPS
400.0000 mg | ORAL_CAPSULE | Freq: Three times a day (TID) | ORAL | Status: DC
  Administered 2012-04-30 – 2012-05-03 (×8): 400 mg via ORAL
  Filled 2012-04-30 (×10): qty 1

## 2012-04-30 MED ORDER — SODIUM CHLORIDE 0.9 % IV BOLUS (SEPSIS)
500.0000 mL | Freq: Once | INTRAVENOUS | Status: AC
  Administered 2012-04-30: 500 mL via INTRAVENOUS

## 2012-04-30 MED ORDER — ALLOPURINOL 150 MG HALF TABLET
150.0000 mg | ORAL_TABLET | Freq: Every day | ORAL | Status: DC
  Administered 2012-05-01: 150 mg via ORAL
  Filled 2012-04-30 (×2): qty 1

## 2012-04-30 MED ORDER — SODIUM CHLORIDE 0.9 % IV SOLN
INTRAVENOUS | Status: DC
  Administered 2012-04-30 – 2012-05-01 (×2): via INTRAVENOUS
  Administered 2012-05-02: 1000 mL via INTRAVENOUS

## 2012-04-30 NOTE — Discharge Summary (Signed)
Physician Discharge Summary  Patient ID: [name] MRN: [id number] DOB/AGE: Dec 23, 1940 71 y.o.  Admit date: 04/28/2012 Discharge date: 04/30/2012  Admission Diagnoses: Urethral diverticulum  Discharge Diagnoses:  Urethral diverticulum  Discharged Condition: good  Hospital Course: The patient was admitted following urethral diverticulectomy. On postoperative day #1 she remained afebrile with stable vital signs. Her Foley catheter was draining clear urine. She was at her baseline ambulation status and had good pain control. She also is tolerating a regular diet. She was discharged to home.  Consults: None  Significant Diagnostic Studies: none  Treatments: surgery: Urethral diverticulectomy  Discharge Exam: Blood pressure 114/49, pulse 73, temperature 98.2 F (36.8 C), temperature source Oral, resp. rate 12, height 5\' 3"  (1.6 m), weight 90.1 kg (198 lb 10.2 oz), SpO2 93.00%. She is in no acute distress Alert and oriented Abdomen soft and nontender, right lower quadrant transplant kidney Foley catheter with clear urine  Disposition: 01-Home or Self Care   Medication List  As of 04/30/2012  9:57 AM   STOP taking these medications         nitrofurantoin (macrocrystal-monohydrate) 100 MG capsule      PROCRIT IJ         TAKE these medications         allopurinol 300 MG tablet   Commonly known as: ZYLOPRIM   Take 150 mg by mouth daily with breakfast.      amLODipine 10 MG tablet   Commonly known as: NORVASC   Take 10 mg by mouth daily with breakfast.      calcitRIOL 0.5 MCG capsule   Commonly known as: ROCALTROL   Take 0.5 mcg by mouth daily with breakfast.      calcium carbonate 500 MG chewable tablet   Commonly known as: TUMS - dosed in mg elemental calcium   Chew 2 tablets by mouth 3 (three) times daily with meals.      ciprofloxacin 250 MG tablet   Commonly known as: CIPRO   Take 1 tablet (250 mg total) by mouth 2 (two) times daily.      fentaNYL 50 MCG/HR   Commonly known as: DURAGESIC - dosed mcg/hr   Place 1 patch onto the skin every 3 (three) days.      fentaNYL 12 MCG/HR   Commonly known as: DURAGESIC - dosed mcg/hr   Place 1 patch onto the skin every 3 (three) days.      furosemide 40 MG tablet   Commonly known as: LASIX   Take 40 mg by mouth 2 (two) times daily.      gabapentin 400 MG capsule   Commonly known as: NEURONTIN   Take 400 mg by mouth 3 (three) times daily.      insulin detemir 100 UNIT/ML injection   Commonly known as: LEVEMIR   Inject 28 Units into the skin daily with breakfast.      insulin lispro 100 UNIT/ML injection   Commonly known as: HUMALOG   Inject 3-12 Units into the skin 3 (three) times daily before meals. Pt is on a sliding scale      ketoconazole 2 % cream   Commonly known as: NIZORAL   Apply 1 application topically 2 (two) times daily.      labetalol 200 MG tablet   Commonly known as: NORMODYNE   Take 300 mg by mouth 3 (three) times daily.      latanoprost 0.005 % ophthalmic solution   Commonly known as: XALATAN   Place 1 drop  into the right eye at bedtime.      levothyroxine 112 MCG tablet   Commonly known as: SYNTHROID, LEVOTHROID   Take 224 mcg by mouth daily with breakfast.      magnesium oxide 400 MG tablet   Commonly known as: MAG-OX   Take 400 mg by mouth daily with breakfast.      mycophenolate 250 MG capsule   Commonly known as: CELLCEPT   Take 500 mg by mouth 2 (two) times daily.      potassium chloride SA 20 MEQ tablet   Commonly known as: K-DUR,KLOR-CON   Take 20 mEq by mouth daily with breakfast.      predniSONE 5 MG tablet   Commonly known as: DELTASONE   Take 10 mg by mouth daily with breakfast.      ranitidine 150 MG tablet   Commonly known as: ZANTAC   Take 150 mg by mouth daily.      sirolimus 1 MG tablet   Commonly known as: RAPAMUNE   Take 2 mg by mouth daily.      traMADol 50 MG tablet   Commonly known as: ULTRAM   Take 1 tablet (50 mg total) by mouth  every 6 (six) hours as needed for pain.      Vitamin D (Ergocalciferol) 50000 UNITS Caps   Commonly known as: DRISDOL   Take 50,000 Units by mouth every 7 (seven) days. On Wednesdays           Follow-up Information    Follow up with Antony Haste, MD. (May 12, 2012; 1:45 PM)    Contact information:   [address] [facility] [address] [facility] [place] [address] [phone]          Signed: Antony Haste 04/30/2012, 9:57 AM

## 2012-04-30 NOTE — ED Notes (Signed)
ION:[id number]<MW> Expected date:04/30/12<BR> Expected time:12:00 PM<BR> Means of arrival:Ambulance<BR> Comments:<BR> [employer] [facility]

## 2012-04-30 NOTE — ED Provider Notes (Signed)
History     CSN: [id number]  Arrival date & time 04/30/12  1225   First MD Initiated Contact with Patient 04/30/12 1239      Chief Complaint  Patient presents with  . Fever  . Weakness    (Consider location/radiation/quality/duration/timing/severity/associated sxs/prior treatment) HPI Patient presents to emergency department by EMS with her husband at bedside with complaint of generalized weakness, altered mental status, and fever he noticed this morning. Patient's husband states that he took her blood sugar last night and was elevated and therefore gave her insulin before bed. Patient states he woke her at 7 AM and found that her blood sugar was still elevated to greater than 300 and therefore gave her some more insulin. He woke her again at 10 AM to recheck her blood sugar and states at that time she seemed "very out of it" noting that she was generally weak and would not follow commands well answering only yes or no and inappropriately to what he was asking. When he tried to stand her and states that she could not hold on to her walker with her right hand "like she could not grip". She is s/p renal transplant in 2007 and is followed by [facility] Dr Eliott Nine, PCP Dr. Evlyn Kanner and urology Dr. Mena Goes. Patient was d/c home on cipro yesterday with foley catheter in place. Husband states symptoms have improved since arrival to ER with patient conversing more appropriately.   Abbreviated d/c summary from Dr. Mena Goes below:  Admit date: 04/28/2012  Discharge date: 04/30/2012  Admission Diagnoses: Urethral diverticulum  Discharge Diagnoses:  Urethral diverticulum  Discharged Condition: good  Hospital Course: The patient was admitted following urethral diverticulectomy. On postoperative day #1 she remained afebrile with stable vital signs. Her Foley catheter was draining clear urine. She was at her baseline ambulation status and had good pain control. She also is tolerating a regular diet. She  was discharged to home.  Consults: None  Significant Diagnostic Studies: none  Treatments: surgery: Urethral diverticulectomy  Discharge Exam:  Blood pressure 114/49, pulse 73, temperature 98.2 F (36.8 C), temperature source Oral, resp. rate 12, height 5\' 3"  (1.6 m), weight 90.1 kg (198 lb 10.2 oz), SpO2 93.00%.  She is in no acute distress  Alert and oriented  Abdomen soft and nontender, right lower quadrant transplant kidney  Foley catheter with clear urine  Disposition: 01-Home or Self Care  Past Medical History  Diagnosis Date  . Diabetes mellitus   . Diabetic neuropathy   . Hypothyroidism   . Hypertension   . GERD (gastroesophageal reflux disease)   . Heart murmur     not treated for  . Arthritis   . Chronic kidney disease     RENAL TRANSPLANT IN 2008--PT IS FOLLOWED BY DR. Karie Fetch WITH BUN OF 37 AND CREAT 1.7--PER NOTE DR. NESI FROM 04/11/12  . Urethral diverticulum   . Eyesight diminished     GLAUCOMA   . History of shingles     LEFT EYE JAN 2012--STILL HAS EYE PAIN-STATES NOT ABLE TO TAKE THE MEDICATION FOR SHINGLES BECAUSE OF HER HX OF KIDNEY TRANSPLANT  . Fingernail abnormalities     FUNGUS OF FINGERNAILS    Past Surgical History  Procedure Date  . Kidney transplant 2008    At [facility]  . Back surgery     ruptured disk  . Abdominal hysterectomy   . Appendectomy   . Laser surgery of both eyes for hemorrhages   . 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 Urethral diverticulectomy 04/28/2012    Procedure: URETHRAL DIVERTICULECTOMY;  Surgeon: Antony Haste, MD;  Location: [facility];  Service: Urology;  Laterality: N/A;    Family History  Problem Relation Age of Onset  . Anesthesia problems Neg Hx     History  Substance Use Topics  . Smoking status: Former Smoker -- 0.5 packs/day for 20 years    Quit date: 10/21/1978  . Smokeless tobacco: Not on file  . Alcohol Use: No    OB History    Grav Para Term Preterm Abortions TAB SAB Ect Mult  Living                  Review of Systems  All other systems reviewed and are negative.    Allergies  Adhesive; Aspirin; Hydrocodone; Iohexol; Oxycodone; Penicillins; Tacrolimus; and Contrast media  Home Medications   Current Outpatient Rx  Name Route Sig Dispense Refill  . ALLOPURINOL 300 MG PO TABS Oral Take 150 mg by mouth daily with breakfast.     . AMLODIPINE BESYLATE 10 MG PO TABS Oral Take 10 mg by mouth daily with breakfast.     . CALCITRIOL 0.5 MCG PO CAPS Oral Take 0.5 mcg by mouth daily with breakfast.     . CALCIUM CARBONATE ANTACID 500 MG PO CHEW Oral Chew 2 tablets by mouth 3 (three) times daily with meals.    Marland Kitchen CIPROFLOXACIN HCL 250 MG PO TABS Oral Take 1 tablet (250 mg total) by mouth 2 (two) times daily. 20 tablet 0  . FENTANYL 12 MCG/HR TD PT72 Transdermal Place 1 patch onto the skin every 3 (three) days.    . FENTANYL 50 MCG/HR TD PT72 Transdermal Place 1 patch onto the skin every 3 (three) days.    . FUROSEMIDE 40 MG PO TABS Oral Take 40 mg by mouth 2 (two) times daily.    Marland Kitchen GABAPENTIN 400 MG PO CAPS Oral Take 400 mg by mouth 3 (three) times daily.    . INSULIN DETEMIR 100 UNIT/ML [place] SOLN Subcutaneous Inject 28 Units into the skin daily with breakfast.     . INSULIN LISPRO (HUMAN) 100 UNIT/ML Crystal SOLN Subcutaneous Inject 3-12 Units into the skin 3 (three) times daily before meals. Pt is on a sliding scale    . KETOCONAZOLE 2 % EX CREA Topical Apply 1 application topically 2 (two) times daily.    Marland Kitchen LABETALOL HCL 200 MG PO TABS Oral Take 300 mg by mouth 3 (three) times daily.     Marland Kitchen LATANOPROST 0.005 % OP SOLN Right Eye Place 1 drop into the right eye at bedtime.    Marland Kitchen LEVOTHYROXINE SODIUM 112 MCG PO TABS Oral Take 224 mcg by mouth daily with breakfast.    . MAGNESIUM OXIDE 400 MG PO TABS Oral Take 400 mg by mouth daily with breakfast.     . MYCOPHENOLATE MOFETIL 250 MG PO CAPS Oral Take 500 mg by mouth 2 (two) times daily.    Marland Kitchen POTASSIUM CHLORIDE CRYS ER 20 MEQ PO  TBCR Oral Take 20 mEq by mouth daily with breakfast.     . PREDNISONE 5 MG PO TABS Oral Take 10 mg by mouth daily with breakfast.     . RANITIDINE HCL 150 MG PO TABS Oral Take 150 mg by mouth daily.    Marland Kitchen SIROLIMUS 1 MG PO TABS Oral Take 2 mg by mouth daily.     . TRAMADOL HCL 50 MG PO TABS Oral Take 1  tablet (50 mg total) by mouth every 6 (six) hours as needed for pain. 30 tablet 0  . VITAMIN D (ERGOCALCIFEROL) 50000 UNITS PO CAPS Oral Take 50,000 Units by mouth every 7 (seven) days. On Wednesdays      BP 159/57  Pulse 80  Temp 101.8 F (38.8 C) (Rectal)  Resp 24  SpO2 100%  Physical Exam  Nursing note and vitals reviewed. Constitutional: She is oriented to person, place, and time. She appears well-developed and well-nourished. No distress.  HENT:  Head: Normocephalic and atraumatic.  Eyes: Conjunctivae and EOM are normal. Pupils are equal, round, and reactive to light.  Neck: Normal range of motion. Neck supple.  Cardiovascular: Normal rate, regular rhythm, normal heart sounds and intact distal pulses.  Exam reveals no gallop and no friction rub.   No murmur heard. Pulmonary/Chest: Effort normal and breath sounds normal. No respiratory distress. She has no wheezes. She has no rales. She exhibits no tenderness.  Abdominal: Soft. Bowel sounds are normal. She exhibits no distension and no mass. There is no tenderness. There is no rebound and no guarding.  Musculoskeletal: Normal range of motion. She exhibits no edema and no tenderness.       4/5 strength of bilateral UE and LU. No focal weakness.   Neurological: She is alert and oriented to person, place, and time. No cranial nerve deficit. Coordination normal.  Skin: Skin is warm and dry. No rash noted. She is not diaphoretic. No erythema.  Psychiatric: She has a normal mood and affect.    ED Course  Procedures (including critical care time)  IV fluids, IV rocephin.  Patient evaluated by DR. Hyman Hopes who agrees patient need  admission. There is a delay in the admission because I spoke with both Dr. Malena Catholic and Dr. Mena Goes to both have requested to consult on patient but would like the other to admit. At this time I'm requesting that Dr. Evlyn Kanner and Dr. Mena Goes speak with each other so that we can conclude who will be admitting the patient who needs admission to the hospital. Her vital signs are stable. She is in no acute distress. The neurofocal findings throughout ER stay.  Labs Reviewed  CBC WITH DIFFERENTIAL - Abnormal; Notable for the following:    Hemoglobin 11.3 (*)     RDW 18.7 (*)     Neutrophils Relative 82 (*)     Lymphocytes Relative 8 (*)     All other components within normal limits  BASIC METABOLIC PANEL - Abnormal; Notable for the following:    Chloride 92 (*)     CO2 37 (*)     Glucose, Bld 181 (*)     BUN 37 (*)     Creatinine, Ser 2.21 (*)     GFR calc non Af Amer 21 (*)     GFR calc Af Amer 24 (*)     All other components within normal limits  URINALYSIS, ROUTINE W REFLEX MICROSCOPIC - Abnormal; Notable for the following:    APPearance CLOUDY (*)     Hgb urine dipstick LARGE (*)     Protein, ur 100 (*)     Leukocytes, UA LARGE (*)     All other components within normal limits  GLUCOSE, CAPILLARY - Abnormal; Notable for the following:    Glucose-Capillary 212 (*)     All other components within normal limits  URINE MICROSCOPIC-ADD ON - Abnormal; Notable for the following:    Bacteria, UA MANY (*)  Casts HYALINE CASTS (*)     All other components within normal limits  LACTIC ACID, PLASMA  PROCALCITONIN  URINE CULTURE  CULTURE, BLOOD (ROUTINE X 2)  CULTURE, BLOOD (ROUTINE X 2)   Dg Chest 2 View  04/30/2012  *RADIOLOGY REPORT*  Clinical Data: Fever, diabetes  CHEST - 2 VIEW  Comparison: Chest radiograph 05/11/2011  Findings: Cardiac silhouette is enlarged to a greater degree than comparison exam 1 year prior.  There is chronic elevation of the right hemidiaphragm.  There is central  venous congestion.  No overt pulmonary edema.  Trace bilateral pleural effusions are present.  IMPRESSION: Enlarged cardiac silhouette, central venous congestion and small effusions.  Original Report Authenticated By: Genevive Bi, M.D.     1. Urinary tract infection   2. Renal insufficiency       MDM  Dr. Mena Goes to admit, spoke with him at 4:57pm        Drucie Opitz, PA 04/30/12 1657  Drucie Opitz, [place] 04/30/12 1659

## 2012-04-30 NOTE — Consult Note (Signed)
[name] is an 71 y.o. 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  2 days ago had removal of urethral diverticuli for recurrent UTI's and returns to ER today after waking up with fever, confusion and weakness.  Baseline Scr mid to upper 1's and in ER Scr 2.2.  UA + for WBC'c.  Poor PO intake over last 2 days.  Past Medical History  Diagnosis Date  . Diabetes mellitus   . Diabetic neuropathy   . Hypothyroidism   . Hypertension   . GERD (gastroesophageal reflux disease)   . Heart murmur     not treated for  . Arthritis   . Chronic kidney disease     RENAL TRANSPLANT IN 2008--PT IS FOLLOWED BY DR. Karie Fetch WITH BUN OF 37 AND CREAT 1.7--PER NOTE DR. NESI FROM 04/11/12  . Urethral diverticulum   . Eyesight diminished     GLAUCOMA   . History of shingles     LEFT EYE JAN 2012--STILL HAS EYE PAIN-STATES NOT ABLE TO TAKE THE MEDICATION FOR SHINGLES BECAUSE OF HER HX OF KIDNEY TRANSPLANT  . Fingernail abnormalities     FUNGUS OF FINGERNAILS  spinal stenosis Hx zoster infection Rt glenohumoral DJD Gout   Past Surgical History  Procedure Date  . Kidney transplant 2008    At [facility]  . Back surgery     ruptured disk  . Abdominal hysterectomy   . Appendectomy   . Laser surgery of both eyes for hemorrhages   . 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 Urethral diverticulectomy 04/28/2012    Procedure: URETHRAL DIVERTICULECTOMY;  Surgeon: Antony Haste, MD;  Location: [facility];  Service: Urology;  Laterality: N/A;    Family History  Problem Relation Age of Onset  . Anesthesia problems Neg Hx    Social History:  reports that she quit smoking about 33 years ago. She has never used smokeless tobacco. She reports that she does not drink alcohol or use illicit drugs.  Allergies:  Allergies  Allergen Reactions  . Adhesive (Tape)   . Aspirin     Crawling feeling under  skin  . Hydrocodone Other (See Comments)    Hallucinations   . Iohexol      Desc: pt unable to tell what the reaction was   . Oxycodone   . Penicillins Hives and Swelling  . Tacrolimus Other (See Comments)    Hallucinations   . Contrast Media (Iodinated Diagnostic Agents) Hives, Swelling and Rash     (Not in a hospital admission)   Lab Results: UA: 21-50 wbc's. TNTC rbc's  Basename 04/30/12 1325 04/29/12 0405 04/28/12 1126  WBC 9.0 11.1* 7.2  HGB 11.3* 10.3* 11.7*  HCT 37.7 33.6* 37.5  PLT 242 218 229   BMET  Basename 04/30/12 1325 04/29/12 0405 04/28/12 1126  NA 140 135 141  K 3.8 3.7 3.2*  CL 92* 90* 94*  CO2 37* 34* 37*  GLUCOSE 181* 300* 143*  BUN 37* 33* 26*  CREATININE 2.21* 1.86* 1.71*  CALCIUM 9.6 8.8 9.5  PHOS -- -- --   LFT No results found for this basename: PROT,ALBUMIN,AST,ALT,ALKPHOS,BILITOT,BILIDIR,IBILI in the last 72 hours Dg Chest 2 View  04/30/2012  *RADIOLOGY REPORT*  Clinical Data: Fever, diabetes  CHEST - 2 VIEW  Comparison: Chest radiograph 05/11/2011  Findings: Cardiac silhouette is enlarged to a greater degree than  comparison exam 1 year prior.  There is chronic elevation of the right hemidiaphragm.  There is central venous congestion.  No overt pulmonary edema.  Trace bilateral pleural effusions are present.  IMPRESSION: Enlarged cardiac silhouette, central venous congestion and small effusions.  Original Report Authenticated By: Genevive Bi, M.D.    ROS: Appetite poor No SOB No CP Chronic pain in back and Rt shoulder Chronic weakness legs No change in vision No change in bowels  PHYSICAL EXAM: Blood pressure 128/50, pulse 65, temperature 98.7 F (37.1 C), temperature source Oral, resp. rate 10, SpO2 100.00%. HEENT: PERRLA EOMI NECK:no JVD LUNGS:few faint basilar crackles CARDIAC:RRR with 2/6 systolic murmur ABD:+ BS NTND No HSM, Tx RLQ nontender, no bruit ZOX:WRUEA edema NEURO:CNI decreased strength in both legs.  Ox2.   Unable to tell me the year.  Assessment: 1. Mild Acute on CKD 3 most likely secondary to hemodynamic perterbations from mild volume depletion and infection 2. SP urethral diverticuli removal 3. ? UTI PLAN: 1. IV hydration at 75 cc/hr 2. IV antibiotics 3. Continue her current immunosuppressives and I ordered these 4. Hold lasix for now.  Resume at later date 5. Cont prednisone at 5 mg but if BP drops then cover with stress doses 6. Recheck labs in AM 7. Await urine and blood cultures   Ember Gottwald T 04/30/2012, 5:50 PM

## 2012-04-30 NOTE — ED Notes (Signed)
PER EMS, pt c/o fever, generalized weakness and body aches x1 day.  Pt was picked up from home, the husband called EMS because he was concerned about his wife not being able to grip with her right.  Pt was treated here yesterday with UTI and given cipro.  Pt is alert and oriented x3.

## 2012-04-30 NOTE — ED Notes (Signed)
Attempted to call report, RN unable to come to the phone. Will call ER when she is able to receive report.

## 2012-04-30 NOTE — ED Notes (Signed)
Patient transported to X-ray 

## 2012-04-30 NOTE — H&P (Signed)
[facility] Medical Consultation  [name] ZOX:[id number] DOB: 10-Mar-1941 DOA: 04/30/2012 PCP: Julian Hy, MD   Requesting physician: Jerilee Field Date of consultation: 04/30/2012 Reason for consultation: Management of medical problems including diabetes mellitus and hypertension  Impression/Recommendations Active Problems:  Heart murmur  Hypertension  Chronic kidney disease, stage III (moderate)  UTI (lower urinary tract infection)  Confusion   1. Fever: 101.8, with urinalysis indicating that she have UTI, this is likely is secondary to herUTI, patient is immunocompromised patient secondary to immunosuppressants. She'll be started on aztreonam and vancomycin. I will make sure she has blood cultures, lactate and procalcitonin ordered.  2. Acute renal failure: On background of CKD stage III, baseline creatinine of 1.7. Nephrology already notified, they recommended rehydration with IV fluids and to continue antibiotics and immunosuppressants.  3. Diabetes mellitus type 2: I will check hemoglobin A1c, start patient on insulin sliding scale and carbohydrate modified diet.   4. Hypertension: This is seems reasonably controlled, please note that patient is on chronic steroids, if she developed hypotension she will benefit from stress dose of steroids. I do not think she needs it now.  5. Confusion: Acute metabolic encephalopathy, secondary to infection.  Dr. Adrian Prince will followup again tomorrow. Please contact me if I can be of assistance in the meanwhile. Thank you for this consultation.  Chief Complaint: Fever and confusion  HPI:  [name] is a 71 year old African American female with history of hypertension, diabetes. She has history of renal transplant in 2007. Patient currently has CKD stage III with baseline creatinine of 1.7. Patient recently undergone urethral diverticulectomy secondary to recurrent UTIs done by Dr. Mena Goes on 04/28/2012. Patient back  today her to the hospital with confusion, generalized weakness and fever. Her urinalysis at the time of admission is consistent with UTI, creatinine is 2.2, please note that she has baseline creatinine of 1.7.  Review of Systems:  Constitutional: Fever  Eyes: negative for irritation, redness and visual disturbance  Ears, nose, mouth, throat, and face: negative for earaches, epistaxis, nasal congestion and sore throat  Respiratory: negative for cough, dyspnea on exertion, sputum and wheezing  Cardiovascular: negative for chest pain, dyspnea, lower extremity edema, orthopnea, palpitations and syncope  Gastrointestinal: negative for abdominal pain, constipation, diarrhea, melena, nausea and vomiting  Genitourinary:negative for dysuria, frequency and hematuria  Hematologic/lymphatic: negative for bleeding, easy bruising and lymphadenopathy  Musculoskeletal:negative for arthralgias, muscle weakness and stiff joints  Neurological: negative for coordination problems, gait problems, headaches and weakness  Endocrine: negative for diabetic symptoms including polydipsia, polyuria and weight loss  Allergic/Immunologic: negative for anaphylaxis, hay fever and urticaria   Past Medical History  Diagnosis Date  . Diabetes mellitus   . Diabetic neuropathy   . Hypothyroidism   . Hypertension   . GERD (gastroesophageal reflux disease)   . Heart murmur     not treated for  . Arthritis   . Chronic kidney disease     RENAL TRANSPLANT IN 2008--PT IS FOLLOWED BY DR. Karie Fetch WITH BUN OF 37 AND CREAT 1.7--PER NOTE DR. NESI FROM 04/11/12  . Urethral diverticulum   . Eyesight diminished     GLAUCOMA   . History of shingles     LEFT EYE JAN 2012--STILL HAS EYE PAIN-STATES NOT ABLE TO TAKE THE MEDICATION FOR SHINGLES BECAUSE OF HER HX OF KIDNEY TRANSPLANT  . Fingernail abnormalities     FUNGUS OF FINGERNAILS   Past Surgical History  Procedure Date  . Kidney transplant 2008  At [facility]  .  Back surgery     ruptured disk  . Abdominal hysterectomy   . Appendectomy   . Laser surgery of both eyes for hemorrhages   . 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 Urethral diverticulectomy 04/28/2012    Procedure: URETHRAL DIVERTICULECTOMY;  Surgeon: Antony Haste, MD;  Location: [facility];  Service: Urology;  Laterality: N/A;   Social History:  reports that she quit smoking about 33 years ago. She has never used smokeless tobacco. She reports that she does not drink alcohol or use illicit drugs.  Allergies  Allergen Reactions  . Adhesive (Tape)   . Aspirin     Crawling feeling under skin  . Hydrocodone Other (See Comments)    Hallucinations   . Iohexol      Desc: pt unable to tell what the reaction was   . Oxycodone   . Penicillins Hives and Swelling  . Tacrolimus Other (See Comments)    Hallucinations   . Contrast Media (Iodinated Diagnostic Agents) Hives, Swelling and Rash   Family History  Problem Relation Age of Onset  . Anesthesia problems Neg Hx     Prior to Admission medications   Medication Sig Start Date End Date Taking? Authorizing Provider  allopurinol (ZYLOPRIM) 300 MG tablet Take 150 mg by mouth daily with breakfast.    Yes Historical Provider, MD  amLODipine (NORVASC) 10 MG tablet Take 10 mg by mouth daily with breakfast.    Yes Historical Provider, MD  calcitRIOL (ROCALTROL) 0.5 MCG capsule Take 0.5 mcg by mouth daily with breakfast.    Yes Historical Provider, MD  calcium carbonate (TUMS - DOSED IN MG ELEMENTAL CALCIUM) 500 MG chewable tablet Chew 2 tablets by mouth 3 (three) times daily with meals.   Yes Historical Provider, MD  ciprofloxacin (CIPRO) 250 MG tablet Take 250 mg by mouth 2 (two) times daily. 04/29/12 05/09/12 Yes Historical Provider, MD  fentaNYL (DURAGESIC - DOSED MCG/HR) 12 MCG/HR Place 1 patch onto the skin every 3 (three) days.   Yes Historical Provider, MD  fentaNYL (DURAGESIC - DOSED MCG/HR) 50 MCG/HR Place 1 patch onto the skin every  3 (three) days.   Yes Historical Provider, MD  furosemide (LASIX) 40 MG tablet Take 40 mg by mouth 2 (two) times daily.   Yes Historical Provider, MD  gabapentin (NEURONTIN) 400 MG capsule Take 400 mg by mouth 3 (three) times daily.   Yes Historical Provider, MD  insulin detemir (LEVEMIR) 100 UNIT/ML injection Inject 28 Units into the skin daily with breakfast.    Yes Historical Provider, MD  insulin lispro (HUMALOG) 100 UNIT/ML injection Inject 3-12 Units into the skin 3 (three) times daily before meals. Pt is on a sliding scale   Yes Historical Provider, MD  ketoconazole (NIZORAL) 2 % cream Apply 1 application topically 2 (two) times daily.   Yes Historical Provider, MD  labetalol (NORMODYNE) 200 MG tablet Take 300 mg by mouth 3 (three) times daily.    Yes Historical Provider, MD  latanoprost (XALATAN) 0.005 % ophthalmic solution Place 1 drop into the right eye at bedtime.   Yes Historical Provider, MD  levothyroxine (SYNTHROID, LEVOTHROID) 112 MCG tablet Take 224 mcg by mouth daily with breakfast.   Yes Historical Provider, MD  magnesium oxide (MAG-OX) 400 MG tablet Take 400 mg by mouth daily with breakfast.    Yes Historical Provider, MD  mycophenolate (CELLCEPT) 250 MG capsule Take 500 mg by mouth 2 (two) times daily.  Yes Historical Provider, MD  predniSONE (DELTASONE) 5 MG tablet Take 10 mg by mouth daily with breakfast.    Yes Historical Provider, MD  ranitidine (ZANTAC) 150 MG tablet Take 150 mg by mouth daily.   Yes Historical Provider, MD  sirolimus (RAPAMUNE) 1 MG tablet Take 2 mg by mouth daily.    Yes Historical Provider, MD  traMADol (ULTRAM) 50 MG tablet Take 50 mg by mouth every 6 (six) hours as needed. pain   Yes Historical Provider, MD  Vitamin D, Ergocalciferol, (DRISDOL) 50000 UNITS CAPS Take 50,000 Units by mouth every 7 (seven) days. On Wednesdays   Yes Historical Provider, MD   Physical Exam: Blood pressure 128/50, pulse 65, temperature 98.7 F (37.1 C), temperature source  Oral, resp. rate 10, SpO2 100.00%. Filed Vitals:   04/30/12 1439 04/30/12 1459 04/30/12 1600 04/30/12 1700  BP: 129/63 148/56 136/50 128/50  Pulse:  68 67 65  Temp:  99.7 F (37.6 C) 98.7 F (37.1 C)   TempSrc:  Rectal Oral   Resp: 18 11 10    SpO2: 100% 100% 100%     General appearance: alert, cooperative and no distress  Head: Normocephalic, without obvious abnormality, atraumatic  Eyes: conjunctivae/corneas clear. PERRL, EOM's intact. Fundi benign.  Nose: Nares normal. Septum midline. Mucosa normal. No drainage or sinus tenderness.  Throat: lips, mucosa, and tongue normal; teeth and gums normal  Neck: Supple, no masses, no cervical lymphadenopathy, no JVD appreciated, no meningeal signs  Resp: clear to auscultation bilaterally  Chest wall: no tenderness  Cardio: regular rate and rhythm, S1, S2 normal, no murmur, click, rub or gallop  GI: soft, non-tender; bowel sounds normal; no masses, no organomegaly  Extremities: extremities normal, atraumatic, no cyanosis or edema  Skin: Skin color, texture, turgor normal. No rashes or lesions  Neurologic: Alert and oriented X 3, normal strength and tone. Normal symmetric reflexes. Normal coordination and gait  Labs on Admission:  Basic Metabolic Panel:  Lab 04/30/12 1610 04/29/12 0405 04/28/12 1126  NA 140 135 141  K 3.8 3.7 3.2*  CL 92* 90* 94*  CO2 37* 34* 37*  GLUCOSE 181* 300* 143*  BUN 37* 33* 26*  CREATININE 2.21* 1.86* 1.71*  CALCIUM 9.6 8.8 9.5  MG -- -- --  PHOS -- -- --   Liver Function Tests: No results found for this basename: AST:5,ALT:5,ALKPHOS:5,BILITOT:5,PROT:5,ALBUMIN:5 in the last 168 hours No results found for this basename: LIPASE:5,AMYLASE:5 in the last 168 hours No results found for this basename: AMMONIA:5 in the last 168 hours CBC:  Lab 04/30/12 1325 04/29/12 0405 04/28/12 1126  WBC 9.0 11.1* 7.2  NEUTROABS 7.3 -- --  HGB 11.3* 10.3* 11.7*  HCT 37.7 33.6* 37.5  MCV 87.7 84.8 84.7  PLT 242 218 229    Cardiac Enzymes: No results found for this basename: CKTOTAL:5,CKMB:5,CKMBINDEX:5,TROPONINI:5 in the last 168 hours BNP: No components found with this basename: POCBNP:5 CBG:  Lab 04/30/12 1800 04/30/12 1318 04/29/12 0713 04/29/12 0206 04/28/12 2106  GLUCAP 111* 212* 254* 300* 374*    Radiological Exams on Admission: Dg Chest 2 View  04/30/2012  *RADIOLOGY REPORT*  Clinical Data: Fever, diabetes  CHEST - 2 VIEW  Comparison: Chest radiograph 05/11/2011  Findings: Cardiac silhouette is enlarged to a greater degree than comparison exam 1 year prior.  There is chronic elevation of the right hemidiaphragm.  There is central venous congestion.  No overt pulmonary edema.  Trace bilateral pleural effusions are present.  IMPRESSION: Enlarged cardiac silhouette, central venous congestion  and small effusions.  Original Report Authenticated By: Genevive Bi, M.D.    EKG: Independently reviewed.     [facility] A [facility] Pager [phone]  If 7PM-7AM, please contact night-coverage [email or web address] Password [facility] 04/30/2012, 6:25 PM

## 2012-04-30 NOTE — ED Provider Notes (Addendum)
Medical screening examination/treatment/procedure(s) were conducted as a shared visit with non-physician practitioner(s) and myself.  I personally evaluated the patient during the encounter  BP 148/56  Pulse 68  Temp 99.7 F (37.6 C) (Rectal)  Resp 11  SpO2 100%  H/o renal txp pw fever, weakness. RRR, abd benign. No CVAT. On Cipro found to have UTI, renal insufficiency. Admit for IV abx and monitoring worsening renal fxn in setting of transplant.  Forbes Cellar, MD 04/30/12 1538  Forbes Cellar, MD 05/02/12 [phone]

## 2012-04-30 NOTE — ED Notes (Signed)
PA at bedside.

## 2012-04-30 NOTE — ED Notes (Signed)
Unsuccessfully attempted blood draw for second set of blood cultures x2.RN made aware

## 2012-04-30 NOTE — H&P (Addendum)
Urology Admission H&P  History of Present Illness: POD#2 urethral diverticulectomy. D/C yesterday AM on Cipro although wound/diverticulum Cx in OR shows no growth.   Patient woke this AM with weakness, altered mental status, and fever. Also her blood sugars have been elevated.   She was examined in the ER and cleared of focal deficits concerning for CVA.   She is s/p renal transplant in 2007 and is followed by [place] [facility] Dr Eliott Nine.   Past Medical History  Diagnosis Date  . Diabetes mellitus   . Diabetic neuropathy   . Hypothyroidism   . Hypertension   . GERD (gastroesophageal reflux disease)   . Heart murmur     not treated for  . Arthritis   . Chronic kidney disease     RENAL TRANSPLANT IN 2008--PT IS FOLLOWED BY DR. Karie Fetch WITH BUN OF 37 AND CREAT 1.7--PER NOTE DR. NESI FROM 04/11/12  . Urethral diverticulum   . Eyesight diminished     GLAUCOMA   . History of shingles     LEFT EYE JAN 2012--STILL HAS EYE PAIN-STATES NOT ABLE TO TAKE THE MEDICATION FOR SHINGLES BECAUSE OF HER HX OF KIDNEY TRANSPLANT  . Fingernail abnormalities     FUNGUS OF FINGERNAILS   Past Surgical History  Procedure Date  . Kidney transplant 2008    At [facility]  . Back surgery     ruptured disk  . Abdominal hysterectomy   . Appendectomy   . Laser surgery of both eyes for hemorrhages   . 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 Urethral diverticulectomy 04/28/2012    Procedure: URETHRAL DIVERTICULECTOMY;  Surgeon: Antony Haste, MD;  Location: [facility];  Service: Urology;  Laterality: N/A;    Home Medications:   (Not in a hospital admission) Allergies:  Allergies  Allergen Reactions  . Adhesive (Tape)   . Aspirin     Crawling feeling under skin  . Hydrocodone Other (See Comments)    Hallucinations   . Iohexol      Desc: pt unable to tell what the reaction was   . Oxycodone   . Penicillins Hives and Swelling  . Tacrolimus Other (See Comments)    Hallucinations     . Contrast Media (Iodinated Diagnostic Agents) Hives, Swelling and Rash    Family History  Problem Relation Age of Onset  . Anesthesia problems Neg Hx    Social History:  reports that she quit smoking about 33 years ago. She has never used smokeless tobacco. She reports that she does not drink alcohol or use illicit drugs.  Review of Systems  Constitutional: Positive for fever and malaise/fatigue.  HENT: Negative.   Eyes: Negative.   Respiratory: Negative.   Gastrointestinal: Negative.   Genitourinary: Negative.   Musculoskeletal: Positive for back pain.  Skin: Negative.   Neurological: Positive for focal weakness.  Endo/Heme/Allergies: Negative.   Psychiatric/Behavioral: Negative.     Physical Exam:  Vital signs in last 24 hours: Temp:  [98.7 F (37.1 C)-101.8 F (38.8 C)] 98.7 F (37.1 C) (07/11 1600) Pulse Rate:  [65-80] 65  (07/11 1700) Resp:  [10-24] 10  (07/11 1600) BP: (127-159)/(50-63) 128/50 mmHg (07/11 1700) SpO2:  [79 %-100 %] 100 % (07/11 1600) Physical Exam NAD A&Ox3 Abd - soft, NT, ND GU - vaginal flap intake, foley palpable in bladder, non-tender, foley draining well.  Ext - mild edema  Laboratory Data:  Results for orders placed during the hospital encounter of 04/30/12 (from the past 24 hour(s))  GLUCOSE, CAPILLARY     Status: Abnormal   Collection Time   04/30/12  1:18 PM      Component Value Range   Glucose-Capillary 212 (*) 70 - 99 mg/dL  CBC WITH DIFFERENTIAL     Status: Abnormal   Collection Time   04/30/12  1:25 PM      Component Value Range   WBC 9.0  4.0 - 10.5 K/uL   RBC 4.30  3.87 - 5.11 MIL/uL   Hemoglobin 11.3 (*) 12.0 - 15.0 g/dL   HCT 45.4  09.8 - 11.9 %   MCV 87.7  78.0 - 100.0 fL   MCH 26.3  26.0 - 34.0 pg   MCHC 30.0  30.0 - 36.0 g/dL   RDW 14.7 (*) 82.9 - 56.2 %   Platelets 242  150 - 400 K/uL   Neutrophils Relative 82 (*) 43 - 77 %   Neutro Abs 7.3  1.7 - 7.7 K/uL   Lymphocytes Relative 8 (*) 12 - 46 %   Lymphs Abs 0.7   0.7 - 4.0 K/uL   Monocytes Relative 10  3 - 12 %   Monocytes Absolute 0.9  0.1 - 1.0 K/uL   Eosinophils Relative 0  0 - 5 %   Eosinophils Absolute 0.0  0.0 - 0.7 K/uL   Basophils Relative 0  0 - 1 %   Basophils Absolute 0.0  0.0 - 0.1 K/uL  BASIC METABOLIC PANEL     Status: Abnormal   Collection Time   04/30/12  1:25 PM      Component Value Range   Sodium 140  135 - 145 mEq/L   Potassium 3.8  3.5 - 5.1 mEq/L   Chloride 92 (*) 96 - 112 mEq/L   CO2 37 (*) 19 - 32 mEq/L   Glucose, Bld 181 (*) 70 - 99 mg/dL   BUN 37 (*) 6 - 23 mg/dL   Creatinine, Ser 1.30 (*) 0.50 - 1.10 mg/dL   Calcium 9.6  8.4 - 86.5 mg/dL   GFR calc non Af Amer 21 (*) >90 mL/min   GFR calc Af Amer 24 (*) >90 mL/min  LACTIC ACID, PLASMA     Status: Normal   Collection Time   04/30/12  1:25 PM      Component Value Range   Lactic Acid, Venous 1.4  0.5 - 2.2 mmol/L  PROCALCITONIN     Status: Normal   Collection Time   04/30/12  1:25 PM      Component Value Range   Procalcitonin 0.98    URINALYSIS, ROUTINE W REFLEX MICROSCOPIC     Status: Abnormal   Collection Time   04/30/12  1:27 PM      Component Value Range   Color, Urine YELLOW  YELLOW   APPearance CLOUDY (*) CLEAR   Specific Gravity, Urine 1.020  1.005 - 1.030   pH 5.5  5.0 - 8.0   Glucose, UA NEGATIVE  NEGATIVE mg/dL   Hgb urine dipstick LARGE (*) NEGATIVE   Bilirubin Urine NEGATIVE  NEGATIVE   Ketones, ur NEGATIVE  NEGATIVE mg/dL   Protein, ur 784 (*) NEGATIVE mg/dL   Urobilinogen, UA 0.2  0.0 - 1.0 mg/dL   Nitrite NEGATIVE  NEGATIVE   Leukocytes, UA LARGE (*) NEGATIVE  URINE MICROSCOPIC-ADD ON     Status: Abnormal   Collection Time   04/30/12  1:27 PM      Component Value Range   Squamous Epithelial / LPF RARE  RARE  WBC, UA 21-50  <3 WBC/hpf   RBC / HPF TOO NUMEROUS TO COUNT  <3 RBC/hpf   Bacteria, UA MANY (*) RARE   Casts HYALINE CASTS (*) NEGATIVE   Urine-Other WBCS IN CLUMPS PRESENT    GLUCOSE, CAPILLARY     Status: Abnormal   Collection  Time   04/30/12  6:00 PM      Component Value Range   Glucose-Capillary 111 (*) 70 - 99 mg/dL   Recent Results (from the past 240 hour(s))  GRAM STAIN     Status: Normal   Collection Time   04/28/12  8:53 AM      Component Value Range Status Comment   Specimen Description DRAINAGE URETHRAL DIVERICULUM   Final    Special Requests PATIENT ON FOLLOWING CIPRO   Final    Gram Stain     Final    Value: RARE WBC SEEN     RARE GRAM NEGATIVE RODS     Gram Stain Report Called to,Read Back By and Verified With: DR Tyniesha Howald IN OR AT 1000 04/28/12 BARFIELD,T   Report Status 04/28/2012 FINAL   Final   WOUND CULTURE     Status: Normal   Collection Time   04/28/12  8:53 AM      Component Value Range Status Comment   Specimen Description DRAINAGE URETHRAL DIVERTICULUM   Final    Special Requests PATIENT ON FOLLOWING CIPRO   Final    Gram Stain     Final    Value: RARE WBC RARE GRAM NEGATIVE RODS     Performed by [facility] Gram Stain Report Called to,Read Back By and Verified With: Gram Stain Report Called to,Read Back By and Verified With: DR Council Munguia IN OR AT 1000 04/28/12 BARFIELD T   Culture NO GROWTH 2 DAYS   Final    Report Status 04/30/2012 FINAL   Final   ANAEROBIC CULTURE     Status: Normal (Preliminary result)   Collection Time   04/28/12  8:53 AM      Component Value Range Status Comment   Specimen Description DRAINAGE URETHRAL DVIERICULUM   Final    Special Requests PATIENT ON FOLLOWING CIPRO   Final    Gram Stain     Final    Value: NO WBC SEEN     NO SQUAMOUS EPITHELIAL CELLS SEEN     NO ORGANISMS SEEN   Culture NO ANAEROBES ISOLATED   Final    Report Status PENDING   Incomplete    Creatinine:  Basename 04/30/12 1325 04/29/12 0405 04/28/12 1126  CREATININE 2.21* 1.86* 1.71*     Impression/Assessment:  Fever - wound Cx and last urine Cx clear.  Acute on CKD Hyperglycemia  Plan:  -continue foley -rocephin and vanc -blood and urine Cx sent -ID consulted for abx  dosing and interval -Nephrology consulted for fluid status, CKD and immunosupression -Hospitalist consulted for hyperglycemia, HTN, hypothyroid   Antony Haste 04/30/2012, 6:26 PM   ADD: I should add I spoke with Dr. Ninetta Lights who suggested Vanc and Zosyn, but he would make specific recommendations in the AM. Given her penicillin allergy I started Vanc and continued rocephin.

## 2012-04-30 NOTE — ED Notes (Signed)
Lab notified to collect 2nd set of cultures. 2nd set collected and sent to lab for processing.

## 2012-04-30 NOTE — ED Notes (Signed)
Called report to Revonda Standard on [address]

## 2012-04-30 NOTE — Progress Notes (Signed)
ANTIBIOTIC CONSULT NOTE - INITIAL  Pharmacy Consult for vancomycin Indication: UTI  Allergies  Allergen Reactions  . Adhesive (Tape)   . Aspirin     Crawling feeling under skin  . Hydrocodone Other (See Comments)    Hallucinations   . Iohexol      Desc: pt unable to tell what the reaction was   . Oxycodone   . Penicillins Hives and Swelling  . Tacrolimus Other (See Comments)    Hallucinations   . Contrast Media (Iodinated Diagnostic Agents) Hives, Swelling and Rash    Patient Measurements: Height: 5' 2.99" (160 cm) Weight: 198 lb 10.2 oz (90.1 kg) IBW/kg (Calculated) : 52.38   Vital Signs: Temp: 98.7 F (37.1 C) (07/11 1600) Temp src: Oral (07/11 1600) BP: 128/50 mmHg (07/11 1700) Pulse Rate: 65  (07/11 1700) Intake/Output from previous day:   Intake/Output from this shift:    Labs:  Basename 04/30/12 1325 04/29/12 0405 04/28/12 1126  WBC 9.0 11.1* 7.2  HGB 11.3* 10.3* 11.7*  PLT 242 218 229  LABCREA -- -- --  CREATININE 2.21* 1.86* 1.71*   Estimated Creatinine Clearance: 24.2 ml/min (by C-G formula based on Cr of 2.21). No results found for this basename: VANCOTROUGH:2,VANCOPEAK:2,VANCORANDOM:2,GENTTROUGH:2,GENTPEAK:2,GENTRANDOM:2,TOBRATROUGH:2,TOBRAPEAK:2,TOBRARND:2,AMIKACINPEAK:2,AMIKACINTROU:2,AMIKACIN:2, in the last 72 hours   Microbiology: Recent Results (from the past 720 hour(s))  URINE CULTURE     Status: Normal   Collection Time   04/02/12  9:10 AM      Component Value Range Status Comment   Specimen Description URINE, CLEAN CATCH   Final    Special Requests ADDED AT 0944 ON [id number]   Final    Culture  Setup Time [id number]   Final    Colony Count 6,000 COLONIES/ML   Final    Culture INSIGNIFICANT GROWTH   Final    Report Status 04/03/2012 FINAL   Final   SURGICAL PCR SCREEN     Status: Normal   Collection Time   04/16/12  3:25 PM      Component Value Range Status Comment   MRSA, PCR NEGATIVE  NEGATIVE Final    Staphylococcus aureus  NEGATIVE  NEGATIVE Final   GRAM STAIN     Status: Normal   Collection Time   04/28/12  8:53 AM      Component Value Range Status Comment   Specimen Description DRAINAGE URETHRAL DIVERICULUM   Final    Special Requests PATIENT ON FOLLOWING CIPRO   Final    Gram Stain     Final    Value: RARE WBC SEEN     RARE GRAM NEGATIVE RODS     Gram Stain Report Called to,Read Back By and Verified With: DR ESKRIDGE IN OR AT 1000 04/28/12 BARFIELD,T   Report Status 04/28/2012 FINAL   Final   WOUND CULTURE     Status: Normal   Collection Time   04/28/12  8:53 AM      Component Value Range Status Comment   Specimen Description DRAINAGE URETHRAL DIVERTICULUM   Final    Special Requests PATIENT ON FOLLOWING CIPRO   Final    Gram Stain     Final    Value: RARE WBC RARE GRAM NEGATIVE RODS     Performed by [facility] Gram Stain Report Called to,Read Back By and Verified With: Gram Stain Report Called to,Read Back By and Verified With: DR ESKRIDGE IN OR AT 1000 04/28/12 [place]FIELD T   Culture NO GROWTH 2 DAYS   Final    Report Status  04/30/2012 FINAL   Final   ANAEROBIC CULTURE     Status: Normal (Preliminary result)   Collection Time   04/28/12  8:53 AM      Component Value Range Status Comment   Specimen Description DRAINAGE URETHRAL DVIERICULUM   Final    Special Requests PATIENT ON FOLLOWING CIPRO   Final    Gram Stain     Final    Value: NO WBC SEEN     NO SQUAMOUS EPITHELIAL CELLS SEEN     NO ORGANISMS SEEN   Culture NO ANAEROBES ISOLATED   Final    Report Status PENDING   Incomplete     Medical History: Past Medical History  Diagnosis Date  . Diabetes mellitus   . Diabetic neuropathy   . Hypothyroidism   . Hypertension   . GERD (gastroesophageal reflux disease)   . Heart murmur     not treated for  . Arthritis   . Chronic kidney disease     RENAL TRANSPLANT IN 2008--PT IS FOLLOWED BY DR. Karie Fetch WITH BUN OF 37 AND CREAT 1.7--PER NOTE DR. NESI FROM 04/11/12  . Urethral  diverticulum   . Eyesight diminished     GLAUCOMA   . History of shingles     LEFT EYE JAN 2012--STILL HAS EYE PAIN-STATES NOT ABLE TO TAKE THE MEDICATION FOR SHINGLES BECAUSE OF HER HX OF KIDNEY TRANSPLANT  . Fingernail abnormalities     FUNGUS OF FINGERNAILS    Medications:  Scheduled:    . acetaminophen  650 mg Oral Once  . calcitRIOL  0.5 mcg Oral Daily  . cefTRIAXone (ROCEPHIN)  IV  1 g Intravenous Once  . cefTRIAXone (ROCEPHIN)  IV  1 g Intravenous Q24H  . diphenhydrAMINE  12.5 mg Intravenous Once  . gabapentin  400 mg Oral TID  . labetalol  300 mg Oral TID  . magnesium oxide  400 mg Oral Daily  . mycophenolate  500 mg Oral BID  . predniSONE  5 mg Oral Q breakfast  . sirolimus  2 mg Oral Daily  . sirolimus  3 mg Oral Daily  . sodium chloride  500 mL Intravenous Once  . Vitamin D (Ergocalciferol)  50,000 Units Oral Q7 days  . DISCONTD: ciprofloxacin  200 mg Intravenous Q12H  . DISCONTD: phenylephrine  1 suppository Rectal BID   Infusions:    . sodium chloride    . 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

## 2012-04-30 NOTE — ED Notes (Signed)
Called to give report, was told that receiving nurse is tied up with another pt [name]), and will return phone call.

## 2012-05-01 DIAGNOSIS — N390 Urinary tract infection, site not specified: Secondary | ICD-10-CM

## 2012-05-01 LAB — GLUCOSE, CAPILLARY
Glucose-Capillary: 171 mg/dL — ABNORMAL HIGH (ref 70–99)
Glucose-Capillary: 267 mg/dL — ABNORMAL HIGH (ref 70–99)

## 2012-05-01 LAB — CBC
HCT: 32.7 % — ABNORMAL LOW (ref 36.0–46.0)
Hemoglobin: 9.8 g/dL — ABNORMAL LOW (ref 12.0–15.0)
MCHC: 30 g/dL (ref 30.0–36.0)
MCV: 87 fL (ref 78.0–100.0)

## 2012-05-01 LAB — COMPREHENSIVE METABOLIC PANEL
ALT: 6 U/L (ref 0–35)
Albumin: 3 g/dL — ABNORMAL LOW (ref 3.5–5.2)
BUN: 34 mg/dL — ABNORMAL HIGH (ref 6–23)
Calcium: 8.6 mg/dL (ref 8.4–10.5)
GFR calc Af Amer: 28 mL/min — ABNORMAL LOW (ref >90)
Glucose, Bld: 159 mg/dL — ABNORMAL HIGH (ref 70–99)
Sodium: 139 mEq/L (ref 135–145)
Total Protein: 5.8 g/dL — ABNORMAL LOW (ref 6.0–8.3)

## 2012-05-01 LAB — PHOSPHORUS: Phosphorus: 3.3 mg/dL (ref 2.3–4.6)

## 2012-05-01 LAB — HEMOGLOBIN A1C: Mean Plasma Glucose: 180 mg/dL — ABNORMAL HIGH (ref <117)

## 2012-05-01 LAB — URINE CULTURE: Culture: NO GROWTH

## 2012-05-01 MED ORDER — INSULIN ASPART 100 UNIT/ML ~~LOC~~ SOLN
2.0000 [IU] | Freq: Once | SUBCUTANEOUS | Status: AC
  Administered 2012-05-01: 2 [IU] via SUBCUTANEOUS

## 2012-05-01 MED ORDER — AMLODIPINE BESYLATE 10 MG PO TABS
10.0000 mg | ORAL_TABLET | Freq: Every day | ORAL | Status: DC
Start: 2012-05-02 — End: 2012-05-03
  Administered 2012-05-02 – 2012-05-03 (×2): 10 mg via ORAL
  Filled 2012-05-01 (×2): qty 1

## 2012-05-01 MED ORDER — ALLOPURINOL 150 MG HALF TABLET
150.0000 mg | ORAL_TABLET | Freq: Every day | ORAL | Status: DC
  Administered 2012-05-02 – 2012-05-03 (×2): 150 mg via ORAL
  Filled 2012-05-01 (×2): qty 1

## 2012-05-01 MED ORDER — INSULIN DETEMIR 100 UNIT/ML ~~LOC~~ SOLN
38.0000 [IU] | Freq: Every day | SUBCUTANEOUS | Status: DC
Start: 2012-05-01 — End: 2012-05-01
  Filled 2012-05-01: qty 10

## 2012-05-01 MED ORDER — INSULIN DETEMIR 100 UNIT/ML ~~LOC~~ SOLN
28.0000 [IU] | Freq: Every day | SUBCUTANEOUS | Status: DC
  Administered 2012-05-01 – 2012-05-03 (×3): 28 [IU] via SUBCUTANEOUS
  Filled 2012-05-01 (×2): qty 10

## 2012-05-01 NOTE — Progress Notes (Addendum)
Subjective: Feels a lot better. No chills or sweats overnight. Tmax 101.8 but no other spikes. Ate 100% of breakfast. Creat much better already. No BP issues  Objective: Vital signs in last 24 hours: Temp:  [98.1 F (36.7 C)-101.8 F (38.8 C)] 98.7 F (37.1 C) (07/12 0600) Pulse Rate:  [65-80] 69  (07/12 0600) Resp:  [10-24] 18  (07/12 0600) BP: (126-159)/(50-63) 141/62 mmHg (07/12 0600) SpO2:  [79 %-100 %] 99 % (07/12 0600) Weight:  [90.1 kg (198 lb 10.2 oz)] 90.1 kg (198 lb 10.2 oz) (07/11 1700)  Intake/Output from previous day: 07/11 0701 - 07/12 0700 In: -  Out: 1050 [Urine:1050] Intake/Output this shift:    General: alert, sitting up in no distress. Face symmetric. Eye less swollen. Neck supple. Lungs clear. Ht regular with sys murmur. Alert awake mentating well  Lab Results   Basename 05/01/12 0445 04/30/12 1325  WBC 10.8* 9.0  RBC 3.76* 4.30  HGB 9.8* 11.3*  HCT 32.7* 37.7  MCV 87.0 87.7  MCH 26.1 26.3  RDW 18.7* 18.7*  PLT 213 242    Basename 05/01/12 0445 04/30/12 1325  NA 139 140  K 3.7 3.8  CL 96 92*  CO2 37* 37*  GLUCOSE 159* 181*  BUN 34* 37*  CREATININE 1.95* 2.21*  CALCIUM 8.6 9.6    Studies/Results: Dg Chest 2 View  04/30/2012  *RADIOLOGY REPORT*  Clinical Data: Fever, diabetes  CHEST - 2 VIEW  Comparison: Chest radiograph 05/11/2011  Findings: Cardiac silhouette is enlarged to a greater degree than comparison exam 1 year prior.  There is chronic elevation of the right hemidiaphragm.  There is central venous congestion.  No overt pulmonary edema.  Trace bilateral pleural effusions are present.  IMPRESSION: Enlarged cardiac silhouette, central venous congestion and small effusions.  Original Report Authenticated By: Genevive Bi, M.D.    Scheduled Meds:   . acetaminophen  650 mg Oral Once  . allopurinol  150 mg Oral Q breakfast  . amLODipine  10 mg Oral Q breakfast  . calcitRIOL  0.5 mcg Oral Daily  . calcium carbonate  2 tablet Oral TID WC   . cefTRIAXone (ROCEPHIN)  IV  1 g Intravenous Once  . cefTRIAXone (ROCEPHIN)  IV  1 g Intravenous Q24H  . diphenhydrAMINE  12.5 mg Intravenous Once  . famotidine  20 mg Oral QHS  . fentaNYL  12.5 mcg Transdermal Q72H  . fentaNYL  50 mcg Transdermal Q72H  . furosemide  40 mg Oral BID  . gabapentin  400 mg Oral TID  . insulin aspart  0-9 Units Subcutaneous TID WC  . labetalol  300 mg Oral TID  . latanoprost  1 drop Right Eye QHS  . levothyroxine  224 mcg Oral Q breakfast  . magnesium oxide  400 mg Oral Daily  . mycophenolate  500 mg Oral BID  . predniSONE  10 mg Oral Q breakfast  . sirolimus  3 mg Oral Daily  . sodium chloride  500 mL Intravenous Once  . vancomycin  750 mg Intravenous Q24H  . Vitamin D (Ergocalciferol)  50,000 Units Oral Q Wed  . DISCONTD: calcitRIOL  0.5 mcg Oral Q breakfast  . DISCONTD: ciprofloxacin  200 mg Intravenous Q12H  . DISCONTD: magnesium oxide  400 mg Oral Q breakfast  . DISCONTD: mycophenolate  500 mg Oral BID  . DISCONTD: phenylephrine  1 suppository Rectal BID  . DISCONTD: predniSONE  5 mg Oral Q breakfast  . DISCONTD: sirolimus  2 mg Oral  Daily   Continuous Infusions:   . sodium chloride 75 mL/hr at 04/30/12 2341  . DISCONTD: sodium chloride     PRN Meds:  Assessment/Plan: 1. DM 2: will restart levemir 2. Renal transplant: Cr getting better 3. UTI Post Procedure:On rocephin and vanc, ID on board, WBC 10.8   LOS: 1 day   [name] 05/01/2012, 8:35 AM

## 2012-05-01 NOTE — Progress Notes (Signed)
Patient ID: [name], female   DOB: 03/02/1941, 71 y.o.   MRN: [id number]   [facility] Progress Note    Subjective:   Reports to be feeling better- husband states that "she is back to normal"   Objective:   BP 153/73  Pulse 78  Temp 98.9 F (37.2 C) (Oral)  Resp 20  Ht 5' 2.99" (1.6 m)  Wt 90.1 kg (198 lb 10.2 oz)  BMI 35.20 kg/m2  SpO2 99%  Intake/Output Summary (Last 24 hours) at 05/01/12 1148 Last data filed at 05/01/12 0500  Gross per 24 hour  Intake      0 ml  Output   1050 ml  Net  -1050 ml   Weight change:   Physical Exam: NWG:[id number] sitting on the side of her bed QIO:NGEXB RRR, normal S1 and S2  Resp:CTA bilaterally, no rales/rhonchi MWU:XLKG, obese, no allograft site tenderness Ext:No LE edema  Imaging: Dg Chest 2 View  04/30/2012  *RADIOLOGY REPORT*  Clinical Data: Fever, diabetes  CHEST - 2 VIEW  Comparison: Chest radiograph 05/11/2011  Findings: Cardiac silhouette is enlarged to a greater degree than comparison exam 1 year prior.  There is chronic elevation of the right hemidiaphragm.  There is central venous congestion.  No overt pulmonary edema.  Trace bilateral pleural effusions are present.  IMPRESSION: Enlarged cardiac silhouette, central venous congestion and small effusions.  Original Report Authenticated By: Genevive Bi, M.D.    Labs: BMET  Lab 05/01/12 0445 04/30/12 1325 04/29/12 0405 04/28/12 1126  NA 139 140 135 141  K 3.7 3.8 3.7 3.2*  CL 96 92* 90* 94*  CO2 37* 37* 34* 37*  GLUCOSE 159* 181* 300* 143*  BUN 34* 37* 33* 26*  CREATININE 1.95* 2.21* 1.86* 1.71*  ALB -- -- -- --  CALCIUM 8.6 9.6 8.8 9.5  PHOS 3.3 -- -- --   CBC  Lab 05/01/12 0445 04/30/12 1325 04/29/12 0405 04/28/12 1126  WBC 10.8* 9.0 11.1* 7.2  NEUTROABS -- 7.3 -- --  HGB 9.8* 11.3* 10.3* 11.7*  HCT 32.7* 37.7 33.6* 37.5  MCV 87.0 87.7 84.8 84.7  PLT 213 242 218 229   Results for orders placed during the hospital encounter of 04/30/12    CULTURE, BLOOD (ROUTINE X 2)     Status: Normal (Preliminary result)   Collection Time   04/30/12  1:25 PM      Component Value Range Status Comment   Specimen Description BLOOD RIGHT HAND   Final    Special Requests BOTTLES DRAWN AEROBIC AND ANAEROBIC   Final    Culture  Setup Time 04/30/2012 20:01   Final    Culture     Final    Value:        BLOOD CULTURE RECEIVED NO GROWTH TO DATE CULTURE WILL BE HELD FOR 5 DAYS BEFORE ISSUING A FINAL NEGATIVE REPORT   Report Status PENDING   Incomplete   CULTURE, BLOOD (ROUTINE X 2)     Status: Normal (Preliminary result)   Collection Time   04/30/12  2:30 PM      Component Value Range Status Comment   Specimen Description BLOOD RIGHT ARM   Final    Special Requests BOTTLES DRAWN AEROBIC AND ANAEROBIC 5CC   Final    Culture  Setup Time 04/30/2012 20:01   Final    Culture     Final    Value:        BLOOD CULTURE RECEIVED NO GROWTH TO  DATE CULTURE WILL BE HELD FOR 5 DAYS BEFORE ISSUING A FINAL NEGATIVE REPORT   Report Status PENDING   Incomplete      Medications:      . acetaminophen  650 mg Oral Once  . allopurinol  150 mg Oral Q breakfast  . amLODipine  10 mg Oral Q breakfast  . calcitRIOL  0.5 mcg Oral Daily  . calcium carbonate  2 tablet Oral TID WC  . cefTRIAXone (ROCEPHIN)  IV  1 g Intravenous Once  . cefTRIAXone (ROCEPHIN)  IV  1 g Intravenous Q24H  . diphenhydrAMINE  12.5 mg Intravenous Once  . famotidine  20 mg Oral QHS  . fentaNYL  12.5 mcg Transdermal Q72H  . fentaNYL  50 mcg Transdermal Q72H  . furosemide  40 mg Oral BID  . gabapentin  400 mg Oral TID  . insulin aspart  0-9 Units Subcutaneous TID WC  . insulin detemir  28 Units Subcutaneous Daily  . labetalol  300 mg Oral TID  . latanoprost  1 drop Right Eye QHS  . levothyroxine  224 mcg Oral Q breakfast  . magnesium oxide  400 mg Oral Daily  . mycophenolate  500 mg Oral BID  . predniSONE  10 mg Oral Q breakfast  . sirolimus  3 mg Oral Daily  . sodium chloride  500  mL Intravenous Once  . vancomycin  750 mg Intravenous Q24H  . Vitamin D (Ergocalciferol)  50,000 Units Oral Q Wed  . DISCONTD: calcitRIOL  0.5 mcg Oral Q breakfast  . DISCONTD: ciprofloxacin  200 mg Intravenous Q12H  . DISCONTD: insulin detemir  38 Units Subcutaneous Daily  . DISCONTD: magnesium oxide  400 mg Oral Q breakfast  . DISCONTD: mycophenolate  500 mg Oral BID  . DISCONTD: phenylephrine  1 suppository Rectal BID  . DISCONTD: predniSONE  5 mg Oral Q breakfast  . DISCONTD: sirolimus  2 mg Oral Daily     Assessment/ Plan:   1. Acute Renal failure on CKD 3T: most likely hemodynamic from mild volume depletion/ongoing diuretic therapy and possible urinary tract infection. Renal function improving with good UOP on IV fluids and no acute electrolyte concerns noted. Volume status appears to be nearing euvolemia. Will re-evaluate need to stop IVFs tomorrow. I rechecked immunosupression dosing with the husband and he confirms: Sirolimus 3mg  QDay, Prednisone 5mg  QDay and mycophenolate 500 BID. 2. SP urethral diverticuli removal: Plans noted by urology to leave foley catheter in-situ for the next 4 days followed by [facility] 3. Possible UTI: Blood cultures are so far negative, await UCx data. On IV rocephin. Clinically better and mentation appears to be back to baseline.  Zetta Bills, MD 05/01/2012, 11:48 AM

## 2012-05-01 NOTE — Consult Note (Signed)
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  Await urine Cx and blood Cx Comment- possible she could have vre from her multiple previous enterococci but she has improved rapidly and dramatically in hospital on therapy that would not treat VRE.  Will follow with you, thanks  Thank you so much for this interesting consult,   Johny Sax [phone]  Syd Manges Rogacki is an 71 y.o. female.  HPI: 71 yo F with hx of cadaveric renal txp 2008 (prednisone 10mg , sirolimus 3mg , cellcept 500mg  bid), urethral diverticulectomy 04-27-12. She was d/c home on 04-30-12 with a foley catheter (draining clear urine). She was d/c with a rx for cipro.  She returns 04-30-12 with fever, weakness, confusion (per husband "awake but not awake") and hyperglycemia beginning that AM. She was found in urology office to have WBC 9, temp 101.8 and Cr 2.21.  She was started on ceftriaxone and vanco. 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  Multiple previous UCx from July 2012 grew enterococcus (none VRE).   Past Medical History  Diagnosis Date  . Diabetes mellitus   . Diabetic neuropathy   . Hypothyroidism   . Hypertension   . GERD (gastroesophageal reflux disease)   . Heart murmur     not treated for  . Arthritis   . Chronic kidney disease     RENAL TRANSPLANT IN 2008--PT IS FOLLOWED BY DR. Karie Fetch WITH BUN OF 37 AND CREAT 1.7--PER NOTE DR. NESI FROM 04/11/12  . Urethral diverticulum   . Eyesight diminished     GLAUCOMA   . History of shingles     LEFT EYE JAN 2012--STILL HAS EYE PAIN-STATES NOT ABLE TO TAKE THE MEDICATION FOR SHINGLES BECAUSE OF HER HX OF KIDNEY TRANSPLANT  . Fingernail abnormalities     FUNGUS OF FINGERNAILS    Past Surgical History    Procedure Date  . Kidney transplant 2008    At [facility]  . Back surgery     ruptured disk  . Abdominal hysterectomy   . Appendectomy   . Laser surgery of both eyes for hemorrhages   . 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 Urethral diverticulectomy 04/28/2012    Procedure: URETHRAL DIVERTICULECTOMY;  Surgeon: Antony Haste, MD;  Location: [facility];  Service: Urology;  Laterality: N/A;  Allergies:   Allergies  Allergen Reactions  . Adhesive (Tape)   . Aspirin     Crawling feeling under skin  . Hydrocodone Other (See Comments)    Hallucinations   . Iohexol      Desc: pt unable to tell what the reaction was   . Oxycodone   . Penicillins Hives and Swelling    Has tolerated cefazolin and ceftriaxone.  . Tacrolimus Other (See Comments)    Hallucinations   . Contrast Media (Iodinated Diagnostic Agents) Hives, Swelling and Rash    Medications:  Scheduled:   . allopurinol  150 mg Oral Daily  . amLODipine  10 mg Oral Daily  . calcitRIOL  0.5 mcg Oral Daily  . calcium carbonate  2 tablet Oral TID WC  . cefTRIAXone (ROCEPHIN)  IV  1 g Intravenous Q24H  . famotidine  20 mg Oral QHS  . fentaNYL  12.5 mcg Transdermal Q72H  . fentaNYL  50 mcg Transdermal Q72H  .  furosemide  40 mg Oral BID  . gabapentin  400 mg Oral TID  . insulin aspart  0-9 Units Subcutaneous TID WC  . insulin detemir  28 Units Subcutaneous Daily  . labetalol  300 mg Oral TID  . latanoprost  1 drop Right Eye QHS  . levothyroxine  224 mcg Oral Q breakfast  . magnesium oxide  400 mg Oral Daily  . mycophenolate  500 mg Oral BID  . predniSONE  10 mg Oral Q breakfast  . sirolimus  3 mg Oral Daily  . vancomycin  750 mg Intravenous Q24H  . Vitamin D (Ergocalciferol)  50,000 Units Oral Q Wed  . DISCONTD: allopurinol  150 mg Oral Q breakfast  . DISCONTD: amLODipine  10 mg Oral Q breakfast  . DISCONTD: calcitRIOL  0.5 mcg Oral Q breakfast  . DISCONTD: ciprofloxacin  200 mg Intravenous Q12H  .  DISCONTD: insulin detemir  38 Units Subcutaneous Daily  . DISCONTD: magnesium oxide  400 mg Oral Q breakfast  . DISCONTD: mycophenolate  500 mg Oral BID  . DISCONTD: predniSONE  5 mg Oral Q breakfast  . DISCONTD: sirolimus  2 mg Oral Daily    Social History:  reports that she quit smoking about 33 years ago. She has never used smokeless tobacco. She reports that she does not drink alcohol or use illicit drugs.  Family History  Problem Relation Age of Onset  . Anesthesia problems Neg Hx     General ROS: see HPI, normal BM, no change in weight, no dysphagia, poor vision, pain in L foot from scratch at previous hospitalization. No abd or flank pain.   Blood pressure 126/54, pulse 77, temperature 98.5 F (36.9 C), temperature source Oral, resp. rate 18, height 5' 2.99" (1.6 m), weight 90.1 kg (198 lb 10.2 oz), SpO2 100.00%. General appearance: alert, cooperative and no distress Eyes: EOMI, pupils irregular. pt hears my voice but it is not clear she is tracking me from across the room.  Throat: normal findings: oropharynx pink & moist without lesions or evidence of thrush Neck: no adenopathy and supple, symmetrical, trachea midline Lungs: clear to auscultation bilaterally Heart: regular rate and rhythm Abdomen: normal findings: bowel sounds normal, soft, non-tender and no flank pain on R (side of renal txp) Extremities: edema trace edema Skin: no diabetic foot lesions.  Normal light touch LE grossly   Results for orders placed during the hospital encounter of 04/30/12 (from the past 48 hour(s))  GLUCOSE, CAPILLARY     Status: Abnormal   Collection Time   04/30/12  1:18 PM      Component Value Range Comment   Glucose-Capillary 212 (*) 70 - 99 mg/dL   CBC WITH DIFFERENTIAL     Status: Abnormal   Collection Time   04/30/12  1:25 PM      Component Value Range Comment   WBC 9.0  4.0 - 10.5 K/uL    RBC 4.30  3.87 - 5.11 MIL/uL    Hemoglobin 11.3 (*) 12.0 - 15.0 g/dL    HCT 08.6  57.8 -  46.9 %    MCV 87.7  78.0 - 100.0 fL    MCH 26.3  26.0 - 34.0 pg    MCHC 30.0  30.0 - 36.0 g/dL    RDW 62.9 (*) 52.8 - 15.5 %    Platelets 242  150 - 400 K/uL    Neutrophils Relative 82 (*) 43 - 77 %    Neutro Abs 7.3  1.7 -  7.7 K/uL    Lymphocytes Relative 8 (*) 12 - 46 %    Lymphs Abs 0.7  0.7 - 4.0 K/uL    Monocytes Relative 10  3 - 12 %    Monocytes Absolute 0.9  0.1 - 1.0 K/uL    Eosinophils Relative 0  0 - 5 %    Eosinophils Absolute 0.0  0.0 - 0.7 K/uL    Basophils Relative 0  0 - 1 %    Basophils Absolute 0.0  0.0 - 0.1 K/uL   BASIC METABOLIC PANEL     Status: Abnormal   Collection Time   04/30/12  1:25 PM      Component Value Range Comment   Sodium 140  135 - 145 mEq/L    Potassium 3.8  3.5 - 5.1 mEq/L    Chloride 92 (*) 96 - 112 mEq/L    CO2 37 (*) 19 - 32 mEq/L    Glucose, Bld 181 (*) 70 - 99 mg/dL    BUN 37 (*) 6 - 23 mg/dL    Creatinine, Ser 1.61 (*) 0.50 - 1.10 mg/dL    Calcium 9.6  8.4 - 09.6 mg/dL    GFR calc non Af Amer 21 (*) >90 mL/min    GFR calc Af Amer 24 (*) >90 mL/min   LACTIC ACID, PLASMA     Status: Normal   Collection Time   04/30/12  1:25 PM      Component Value Range Comment   Lactic Acid, Venous 1.4  0.5 - 2.2 mmol/L   PROCALCITONIN     Status: Normal   Collection Time   04/30/12  1:25 PM      Component Value Range Comment   Procalcitonin 0.98     CULTURE, BLOOD (ROUTINE X 2)     Status: Normal (Preliminary result)   Collection Time   04/30/12  1:25 PM      Component Value Range Comment   Specimen Description BLOOD RIGHT HAND      Special Requests BOTTLES DRAWN AEROBIC AND ANAEROBIC      Culture  Setup Time 04/30/2012 20:01      Culture        Value:        BLOOD CULTURE RECEIVED NO GROWTH TO DATE CULTURE WILL BE HELD FOR 5 DAYS BEFORE ISSUING A FINAL NEGATIVE REPORT   Report Status PENDING     HEMOGLOBIN A1C     Status: Abnormal   Collection Time   04/30/12  1:25 PM      Component Value Range Comment   Hemoglobin A1C 7.9 (*) <5.7 %      Mean Plasma Glucose 180 (*) <117 mg/dL   URINALYSIS, ROUTINE W REFLEX MICROSCOPIC     Status: Abnormal   Collection Time   04/30/12  1:27 PM      Component Value Range Comment   Color, Urine YELLOW  YELLOW    APPearance CLOUDY (*) CLEAR    Specific Gravity, Urine 1.020  1.005 - 1.030    pH 5.5  5.0 - 8.0    Glucose, UA NEGATIVE  NEGATIVE mg/dL    Hgb urine dipstick LARGE (*) NEGATIVE    Bilirubin Urine NEGATIVE  NEGATIVE    Ketones, ur NEGATIVE  NEGATIVE mg/dL    Protein, ur 045 (*) NEGATIVE mg/dL    Urobilinogen, UA 0.2  0.0 - 1.0 mg/dL    Nitrite NEGATIVE  NEGATIVE    Leukocytes, UA LARGE (*) NEGATIVE   URINE  MICROSCOPIC-ADD ON     Status: Abnormal   Collection Time   04/30/12  1:27 PM      Component Value Range Comment   Squamous Epithelial / LPF RARE  RARE    WBC, UA 21-50  <3 WBC/hpf    RBC / HPF TOO NUMEROUS TO COUNT  <3 RBC/hpf    Bacteria, UA MANY (*) RARE    Casts HYALINE CASTS (*) NEGATIVE    Urine-Other WBCS IN CLUMPS PRESENT     CULTURE, BLOOD (ROUTINE X 2)     Status: Normal (Preliminary result)   Collection Time   04/30/12  2:30 PM      Component Value Range Comment   Specimen Description BLOOD RIGHT ARM      Special Requests BOTTLES DRAWN AEROBIC AND ANAEROBIC 5CC      Culture  Setup Time 04/30/2012 20:01      Culture        Value:        BLOOD CULTURE RECEIVED NO GROWTH TO DATE CULTURE WILL BE HELD FOR 5 DAYS BEFORE ISSUING A FINAL NEGATIVE REPORT   Report Status PENDING     GLUCOSE, CAPILLARY     Status: Abnormal   Collection Time   04/30/12  6:00 PM      Component Value Range Comment   Glucose-Capillary 111 (*) 70 - 99 mg/dL   GLUCOSE, CAPILLARY     Status: Abnormal   Collection Time   04/30/12 10:50 PM      Component Value Range Comment   Glucose-Capillary 125 (*) 70 - 99 mg/dL    Comment 1 Notify RN     COMPREHENSIVE METABOLIC PANEL     Status: Abnormal   Collection Time   05/01/12  4:45 AM      Component Value Range Comment   Sodium 139  135 - 145  mEq/L    Potassium 3.7  3.5 - 5.1 mEq/L    Chloride 96  96 - 112 mEq/L    CO2 37 (*) 19 - 32 mEq/L    Glucose, Bld 159 (*) 70 - 99 mg/dL    BUN 34 (*) 6 - 23 mg/dL    Creatinine, Ser 0.45 (*) 0.50 - 1.10 mg/dL    Calcium 8.6  8.4 - 40.9 mg/dL    Total Protein 5.8 (*) 6.0 - 8.3 g/dL    Albumin 3.0 (*) 3.5 - 5.2 g/dL    AST 13  0 - 37 U/L    ALT 6  0 - 35 U/L    Alkaline Phosphatase 52  39 - 117 U/L    Total Bilirubin 0.4  0.3 - 1.2 mg/dL    GFR calc non Af Amer 24 (*) >90 mL/min    GFR calc Af Amer 28 (*) >90 mL/min   PHOSPHORUS     Status: Normal   Collection Time   05/01/12  4:45 AM      Component Value Range Comment   Phosphorus 3.3  2.3 - 4.6 mg/dL   MAGNESIUM     Status: Normal   Collection Time   05/01/12  4:45 AM      Component Value Range Comment   Magnesium 2.1  1.5 - 2.5 mg/dL   CBC     Status: Abnormal   Collection Time   05/01/12  4:45 AM      Component Value Range Comment   WBC 10.8 (*) 4.0 - 10.5 K/uL    RBC 3.76 (*) 3.87 - 5.11 MIL/uL  Hemoglobin 9.8 (*) 12.0 - 15.0 g/dL    HCT 81.1 (*) 91.4 - 46.0 %    MCV 87.0  78.0 - 100.0 fL    MCH 26.1  26.0 - 34.0 pg    MCHC 30.0  30.0 - 36.0 g/dL    RDW 78.2 (*) 95.6 - 15.5 %    Platelets 213  150 - 400 K/uL   GLUCOSE, CAPILLARY     Status: Abnormal   Collection Time   05/01/12  7:33 AM      Component Value Range Comment   Glucose-Capillary 171 (*) 70 - 99 mg/dL    Comment 1 Notify RN     GLUCOSE, CAPILLARY     Status: Abnormal   Collection Time   05/01/12 11:24 AM      Component Value Range Comment   Glucose-Capillary 267 (*) 70 - 99 mg/dL    Comment 1 Notify RN         Component Value Date/Time   SDES BLOOD RIGHT ARM 04/30/2012 1430   SPECREQUEST BOTTLES DRAWN AEROBIC AND ANAEROBIC 5CC 04/30/2012 1430   CULT        BLOOD CULTURE RECEIVED NO GROWTH TO DATE CULTURE WILL BE HELD FOR 5 DAYS BEFORE ISSUING A FINAL NEGATIVE REPORT 04/30/2012 1430   REPTSTATUS PENDING 04/30/2012 1430   Dg Chest 2 View  04/30/2012   *RADIOLOGY REPORT*  Clinical Data: Fever, diabetes  CHEST - 2 VIEW  Comparison: Chest radiograph 05/11/2011  Findings: Cardiac silhouette is enlarged to a greater degree than comparison exam 1 year prior.  There is chronic elevation of the right hemidiaphragm.  There is central venous congestion.  No overt pulmonary edema.  Trace bilateral pleural effusions are present.  IMPRESSION: Enlarged cardiac silhouette, central venous congestion and small effusions.  Original Report Authenticated By: Genevive Bi, M.D.   Recent Results (from the past 240 hour(s))  GRAM STAIN     Status: Normal   Collection Time   04/28/12  8:53 AM      Component Value Range Status Comment   Specimen Description DRAINAGE URETHRAL DIVERICULUM   Final    Special Requests PATIENT ON FOLLOWING CIPRO   Final    Gram Stain     Final    Value: RARE WBC SEEN     RARE GRAM NEGATIVE RODS     Gram Stain Report Called to,Read Back By and Verified With: DR ESKRIDGE IN OR AT 1000 04/28/12 BARFIELD,T   Report Status 04/28/2012 FINAL   Final   WOUND CULTURE     Status: Normal   Collection Time   04/28/12  8:53 AM      Component Value Range Status Comment   Specimen Description DRAINAGE URETHRAL DIVERTICULUM   Final    Special Requests PATIENT ON FOLLOWING CIPRO   Final    Gram Stain     Final    Value: RARE WBC RARE GRAM NEGATIVE RODS     Performed by [facility] Gram Stain Report Called to,Read Back By and Verified With: Gram Stain Report Called to,Read Back By and Verified With: DR ESKRIDGE IN OR AT 1000 04/28/12 BARFIELD T   Culture NO GROWTH 2 DAYS   Final    Report Status 04/30/2012 FINAL   Final   ANAEROBIC CULTURE     Status: Normal (Preliminary result)   Collection Time   04/28/12  8:53 AM      Component Value Range Status Comment   Specimen Description DRAINAGE URETHRAL DVIERICULUM  Final    Special Requests PATIENT ON FOLLOWING CIPRO   Final    Gram Stain     Final    Value: NO WBC SEEN     NO SQUAMOUS  EPITHELIAL CELLS SEEN     NO ORGANISMS SEEN   Culture NO ANAEROBES ISOLATED   Final    Report Status PENDING   Incomplete   CULTURE, BLOOD (ROUTINE X 2)     Status: Normal (Preliminary result)   Collection Time   04/30/12  1:25 PM      Component Value Range Status Comment   Specimen Description BLOOD RIGHT HAND   Final    Special Requests BOTTLES DRAWN AEROBIC AND ANAEROBIC   Final    Culture  Setup Time 04/30/2012 20:01   Final    Culture     Final    Value:        BLOOD CULTURE RECEIVED NO GROWTH TO DATE CULTURE WILL BE HELD FOR 5 DAYS BEFORE ISSUING A FINAL NEGATIVE REPORT   Report Status PENDING   Incomplete   CULTURE, BLOOD (ROUTINE X 2)     Status: Normal (Preliminary result)   Collection Time   04/30/12  2:30 PM      Component Value Range Status Comment   Specimen Description BLOOD RIGHT ARM   Final    Special Requests BOTTLES DRAWN AEROBIC AND ANAEROBIC 5CC   Final    Culture  Setup Time 04/30/2012 20:01   Final    Culture     Final    Value:        BLOOD CULTURE RECEIVED NO GROWTH TO DATE CULTURE WILL BE HELD FOR 5 DAYS BEFORE ISSUING A FINAL NEGATIVE REPORT   Report Status PENDING   Incomplete       05/01/2012, 4:52 PM     LOS: 1 day

## 2012-05-01 NOTE — Progress Notes (Signed)
Patient ID: [name], female   DOB: [birth year], 71 y.o.   MRN: [id number]  Patient without complaint. No recurrent fever.  CXR - small effusions  Imp - POD#3 urethral diverticulectomy  Plan - -on Vanc and rocephin but will defer to ID. Blood and urine Cx pending. -from a surgical standpoint will continue foley for 4 weeks and check a VCUG in the outpatient

## 2012-05-01 NOTE — Evaluation (Signed)
Physical Therapy Evaluation Patient Details Name: [name] MRN: [id number] DOB: Dec 24, 1940 Today's Date: 05/01/2012 Time: 4098-1191 PT Time Calculation (min): 15 min  PT Assessment / Plan / Recommendation Clinical Impression  71 yo female admitted with heart murmur. Requires +1 for safe ambulation. Pt states her husband is able to assist her at home. Recommend HHPT and supervision/assist for OOB/mobility.     PT Assessment  Patient needs continued PT services    Follow Up Recommendations  Home health PT;Supervision for mobility/OOB    Barriers to Discharge        Equipment Recommendations  None recommended by PT    Recommendations for Other Services OT consult   Frequency Min 3X/week    Precautions / Restrictions Precautions Precautions: Fall Restrictions Weight Bearing Restrictions: No   Pertinent Vitals/Pain       Mobility  Bed Mobility Bed Mobility: Supine to Sit;Sit to Supine Supine to Sit: 4: Min guard Sit to Supine: 4: Min guard Details for Bed Mobility Assistance: VCs safety.  Transfers Transfers: Sit to Stand;Stand to Sit Sit to Stand: 4: Min assist;From bed;With upper extremity assist Stand to Sit: 4: Min assist;To bed;With upper extremity assist Details for Transfer Assistance: VCs safety, technique, hand placement. Assist to rise, stabilize, control descent.  Ambulation/Gait Ambulation/Gait Assistance: 4: Min assist Ambulation Distance (Feet): 30 Feet Assistive device: Rolling walker Ambulation/Gait Assistance Details: VCs safety, technique, distance from RW. Assist to stabilize throughout ambulation and to keep RW at safe distance. Fatigues easily. Dyspnea 3/4. Pt declined to ambulate further.  Gait Pattern: Step-through pattern;Trunk flexed;Decreased stride length;Decreased step length - right;Decreased step length - left    Exercises     PT Diagnosis: Difficulty walking;Generalized weakness  PT Problem List: Decreased strength;Decreased activity  tolerance;Decreased balance;Decreased mobility;Decreased knowledge of use of DME PT Treatment Interventions: DME instruction;Gait training;Functional mobility training;Therapeutic activities;Therapeutic exercise;Balance training;Patient/family education   PT Goals Acute Rehab PT Goals PT Goal Formulation: With patient Potential to Achieve Goals: Good Pt will go Supine/Side to Sit: with supervision PT Goal: Supine/Side to Sit - Progress: Goal set today Pt will go Sit to Supine/Side: with supervision PT Goal: Sit to Supine/Side - Progress: Goal set today Pt will go Sit to Stand: with supervision PT Goal: Sit to Stand - Progress: Goal set today Pt will Ambulate: 51 - 150 feet;with supervision;with rolling walker PT Goal: Ambulate - Progress: Goal set today  Visit Information  Last PT Received On: 05/01/12 Assistance Needed: +1    Subjective Data  Subjective: "I just walked before my husband left" Patient Stated Goal: Home. Walk more   Prior Functioning  Home Living Lives With: Spouse Available Help at Discharge: Family Type of Home: House Home Access: Ramped entrance Home Layout: One level Home Adaptive Equipment: Built-in shower seat;Wheelchair - powered;Walker - rolling;Bedside commode/3-in-1 Prior Function Level of Independence: Needs assistance Needs Assistance: Bathing;Dressing;Meal Prep Bath: Minimal Dressing: Minimal Able to Take Stairs?: No Communication Communication: No difficulties    Cognition  Overall Cognitive Status: Appears within functional limits for tasks assessed/performed Arousal/Alertness: Awake/alert Orientation Level: Appears intact for tasks assessed Behavior During Session: [facility] for tasks performed    Extremity/Trunk Assessment Right Lower Extremity Assessment RLE ROM/Strength/Tone: Deficits RLE ROM/Strength/Tone Deficits: Strength at least 3+/5 with functional activity Left Lower Extremity Assessment LLE ROM/Strength/Tone: Deficits LLE  ROM/Strength/Tone Deficits: Strength at least 3+/5 with functional activity   Balance Balance Balance Assessed: Yes Dynamic Standing Balance Dynamic Standing - Balance Support: Bilateral upper extremity supported Dynamic Standing - Level of  Assistance: 4: Min assist  End of Session PT - End of Session Equipment Utilized During Treatment: Gait belt Activity Tolerance: Patient tolerated treatment well Patient left: in bed;with call bell/phone within reach  GP     Rebeca Alert [facility] 05/01/2012, 3:34 PM [phone]

## 2012-05-01 NOTE — Progress Notes (Signed)
Brief Nutrition Note  Patient identified on the Nutrition Risk Report for dependent feeder.   Body mass index is 35.20 kg/(m^2). Pt meets criteria for obesity class II based on current BMI.   Current diet order is CHO modified medium, patient is consuming approximately 90% of meals at this time. Labs and medications reviewed.   - Met with pt and husband who report pt ate well at home with good appetite and no weight loss other than fluid changes r/t acute renal failure. Pt up 36 pounds since end of June with +2 RLE and LLE edema. Pt states she has been monitoring the sodium in her food. Pt currently eating well without any nutritional needs.   No further nutrition interventions warranted at this time. If additional nutrition issues arise, please re-consult RD.   Dietitian# [phone]  Wt Readings from Last 10 Encounters:  04/30/12 198 lb 10.2 oz (90.1 kg)  04/28/12 198 lb 10.2 oz (90.1 kg)  04/28/12 198 lb 10.2 oz (90.1 kg)  04/16/12 162 lb (73.483 kg)  03/11/12 162 lb 11.2 oz (73.8 kg)  01/15/12 160 lb (72.576 kg)

## 2012-05-01 NOTE — Progress Notes (Signed)
05/01/12 2230 Nursing Dr Vernie Ammons called reg cbg tonight of 242. 2 units novolog insulin ordered

## 2012-05-02 DIAGNOSIS — IMO0002 Reserved for concepts with insufficient information to code with codable children: Secondary | ICD-10-CM | POA: Diagnosis present

## 2012-05-02 LAB — CBC
HCT: 33.7 % — ABNORMAL LOW (ref 36.0–46.0)
MCHC: 30 g/dL (ref 30.0–36.0)
MCV: 88 fL (ref 78.0–100.0)
RDW: 18.6 % — ABNORMAL HIGH (ref 11.5–15.5)

## 2012-05-02 LAB — GLUCOSE, CAPILLARY
Glucose-Capillary: 168 mg/dL — ABNORMAL HIGH (ref 70–99)
Glucose-Capillary: 231 mg/dL — ABNORMAL HIGH (ref 70–99)
Glucose-Capillary: 136 mg/dL — ABNORMAL HIGH (ref 70–99)
Glucose-Capillary: 212 mg/dL — ABNORMAL HIGH (ref 70–99)

## 2012-05-02 LAB — RENAL FUNCTION PANEL
Albumin: 3 g/dL — ABNORMAL LOW (ref 3.5–5.2)
BUN: 29 mg/dL — ABNORMAL HIGH (ref 6–23)
Calcium: 8.8 mg/dL (ref 8.4–10.5)
GFR calc Af Amer: 37 mL/min — ABNORMAL LOW (ref >90)
Glucose, Bld: 171 mg/dL — ABNORMAL HIGH (ref 70–99)
Phosphorus: 2.8 mg/dL (ref 2.3–4.6)
Sodium: 140 mEq/L (ref 135–145)

## 2012-05-02 MED ORDER — INSULIN ASPART 100 UNIT/ML ~~LOC~~ SOLN
4.0000 [IU] | Freq: Three times a day (TID) | SUBCUTANEOUS | Status: DC
  Administered 2012-05-02: 4 [IU] via SUBCUTANEOUS

## 2012-05-02 NOTE — Progress Notes (Signed)
Patient ID: [name], female   DOB: [birth year], 71 y.o.   MRN: [id number]   [facility] Progress Note    Subjective:   Reports to be feeling better- mentally more alert and had a comfortable night   Objective:   BP 149/79  Pulse 69  Temp 98.2 F (36.8 C) (Oral)  Resp 18  Ht 5' 2.99" (1.6 m)  Wt 90.1 kg (198 lb 10.2 oz)  BMI 35.20 kg/m2  SpO2 100%  Intake/Output Summary (Last 24 hours) at 05/02/12 1051 Last data filed at 05/02/12 0900  Gross per 24 hour  Intake   2175 ml  Output   1950 ml  Net    225 ml   Weight change:   Physical Exam: NWG:NFAOZHYQMVH resting in bed QIO:NGEXB RRR, normal S1 and S2  Resp:CTA bilaterally, no rales/rhonchi MWU:XLKG, flat, NT, BS normal Ext:No LE edema  Imaging: Dg Chest 2 View  04/30/2012  *RADIOLOGY REPORT*  Clinical Data: Fever, diabetes  CHEST - 2 VIEW  Comparison: Chest radiograph 05/11/2011  Findings: Cardiac silhouette is enlarged to a greater degree than comparison exam 1 year prior.  There is chronic elevation of the right hemidiaphragm.  There is central venous congestion.  No overt pulmonary edema.  Trace bilateral pleural effusions are present.  IMPRESSION: Enlarged cardiac silhouette, central venous congestion and small effusions.  Original Report Authenticated By: Genevive Bi, M.D.    Labs: BMET  Lab 05/02/12 0656 05/01/12 0445 04/30/12 1325 04/29/12 0405 04/28/12 1126  NA 140 139 140 135 141  K 3.5 3.7 3.8 3.7 3.2*  CL 97 96 92* 90* 94*  CO2 37* 37* 37* 34* 37*  GLUCOSE 171* 159* 181* 300* 143*  BUN 29* 34* 37* 33* 26*  CREATININE 1.57* 1.95* 2.21* 1.86* 1.71*  ALB -- -- -- -- --  CALCIUM 8.8 8.6 9.6 8.8 9.5  PHOS 2.8 3.3 -- -- --   CBC  Lab 05/02/12 0656 05/01/12 0445 04/30/12 1325 04/29/12 0405  WBC 7.1 10.8* 9.0 11.1*  NEUTROABS -- -- 7.3 --  HGB 10.1* 9.8* 11.3* 10.3*  HCT 33.7* 32.7* 37.7 33.6*  MCV 88.0 87.0 87.7 84.8  PLT 226 213 242 218    Medications:      . allopurinol   150 mg Oral Daily  . amLODipine  10 mg Oral Daily  . calcitRIOL  0.5 mcg Oral Daily  . calcium carbonate  2 tablet Oral TID WC  . cefTRIAXone (ROCEPHIN)  IV  1 g Intravenous Q24H  . famotidine  20 mg Oral QHS  . fentaNYL  12.5 mcg Transdermal Q72H  . fentaNYL  50 mcg Transdermal Q72H  . furosemide  40 mg Oral BID  . gabapentin  400 mg Oral TID  . insulin aspart  0-9 Units Subcutaneous TID WC  . insulin aspart  2 Units Subcutaneous Once  . insulin aspart  4 Units Subcutaneous TID WC  . insulin detemir  28 Units Subcutaneous Daily  . labetalol  300 mg Oral TID  . latanoprost  1 drop Right Eye QHS  . levothyroxine  224 mcg Oral Q breakfast  . magnesium oxide  400 mg Oral Daily  . mycophenolate  500 mg Oral BID  . predniSONE  10 mg Oral Q breakfast  . sirolimus  3 mg Oral Daily  . vancomycin  750 mg Intravenous Q24H  . Vitamin D (Ergocalciferol)  50,000 Units Oral Q Wed  . DISCONTD: allopurinol  150 mg Oral Q breakfast  .  DISCONTD: amLODipine  10 mg Oral Q breakfast   Results for orders placed during the hospital encounter of 04/30/12  CULTURE, BLOOD (ROUTINE X 2)     Status: Normal (Preliminary result)   Collection Time   04/30/12  1:25 PM      Component Value Range Status Comment   Specimen Description BLOOD RIGHT HAND   Final    Special Requests BOTTLES DRAWN AEROBIC AND ANAEROBIC   Final    Culture  Setup Time 04/30/2012 20:01   Final    Culture     Final    Value:        BLOOD CULTURE RECEIVED NO GROWTH TO DATE CULTURE WILL BE HELD FOR 5 DAYS BEFORE ISSUING A FINAL NEGATIVE REPORT   Report Status PENDING   Incomplete   URINE CULTURE     Status: Normal   Collection Time   04/30/12  1:27 PM      Component Value Range Status Comment   Specimen Description URINE, CATHETERIZED   Final    Special Requests NONE   Final    Culture  Setup Time 04/30/2012 20:21   Final    Colony Count NO GROWTH   Final    Culture NO GROWTH   Final    Report Status 05/01/2012 FINAL   Final     CULTURE, BLOOD (ROUTINE X 2)     Status: Normal (Preliminary result)   Collection Time   04/30/12  2:30 PM      Component Value Range Status Comment   Specimen Description BLOOD RIGHT ARM   Final    Special Requests BOTTLES DRAWN AEROBIC AND ANAEROBIC 5CC   Final    Culture  Setup Time 04/30/2012 20:01   Final    Culture     Final    Value:        BLOOD CULTURE RECEIVED NO GROWTH TO DATE CULTURE WILL BE HELD FOR 5 DAYS BEFORE ISSUING A FINAL NEGATIVE REPORT   Report Status PENDING   Incomplete      Assessment/ Plan:   1. Acute Renal failure on CKD 3T: most likely from mild volume depletion/ongoing diuretic with possible UTI. Renal function improving and appears to be nearing baseline with good UOP on IV fluids. Volume status appears to be euvolemic. Will stop IVFs today. No changes to immunosuppression and no indication to increase corticosteroid dosing.  2. SP urethral diverticuli removal: Plans noted by urology to leave foley catheter in-situ for the next 4 days followed by [facility] 3. Possible UTI: Blood and urine cultures are so far negative. Urethral diverticulum drainage fluid showed GNRs on 7/9- On IV rocephin and vancomycin- appears may be able to switch to monotherapy soon with PO quinolone or oral 3rd generation cephalosporin. Clinically better and appreciate input from ID.  Zetta Bills, MD 05/02/2012, 10:51 AM

## 2012-05-02 NOTE — ED Provider Notes (Signed)
Medical screening examination/treatment/procedure(s) were performed by non-physician practitioner and as supervising physician I was immediately available for consultation/collaboration.   Forbes Cellar, MD 05/02/12 [phone]

## 2012-05-02 NOTE — Progress Notes (Signed)
Subjective: "I feel so much better".  Still fatigued but feels better overall.  No fever, chills, sweats.    Objective: Vital signs in last 24 hours: Temp:  [98.2 F (36.8 C)-98.5 F (36.9 C)] 98.2 F (36.8 C) (07/13 0600) Pulse Rate:  [69-77] 69  (07/13 0600) Resp:  [18] 18  (07/13 0600) BP: (126-162)/(54-79) 149/79 mmHg (07/13 0600) SpO2:  [100 %] 100 % (07/13 0600) Weight change:  Last BM Date: 04/30/12  CBG (last 3)   Basename 05/02/12 0745 05/01/12 2130 05/01/12 1722  GLUCAP 168* 242* 244*    Intake/Output from previous day: 07/12 0701 - 07/13 0700 In: 1695 [P.O.:720; I.V.:825; IV Piggyback:150] Out: 1950 [Urine:1950] Intake/Output this shift: Total I/O In: 480 [P.O.:480] Out: -   General appearance: alert and no distress Eyes: no scleral icterus; left ptosis and upper lid swelling (related to prior shingles per patient) Throat: oropharynx moist without erythema Resp: minimal bibasilar crackles Cardio: regular rate and rhythm and grade 3/6 Systolic ejection murmur GI: soft, non-tender; bowel sounds normal; no masses,  no organomegaly Extremities: no clubbing, cyanosis or edema   Lab Results:  [facility] 05/02/12 0656 05/01/12 0445  NA 140 139  K 3.5 3.7  CL 97 96  CO2 37* 37*  GLUCOSE 171* 159*  BUN 29* 34*  CREATININE 1.57* 1.95*  CALCIUM 8.8 8.6  MG -- 2.1  PHOS 2.8 3.3    Basename 05/02/12 0656 05/01/12 0445  AST -- 13  ALT -- 6  ALKPHOS -- 52  BILITOT -- 0.4  PROT -- 5.8*  ALBUMIN 3.0* 3.0*    Basename 05/02/12 0656 05/01/12 0445 04/30/12 1325  WBC 7.1 10.8* --  NEUTROABS -- -- 7.3  HGB 10.1* 9.8* --  HCT 33.7* 32.7* --  MCV 88.0 87.0 --  PLT 226 213 --   Lab Results  Component Value Date   INR 1.0 02/19/2009   INR 0.9 04/02/2007   Blood Culture    Component Value Date/Time   SDES BLOOD RIGHT ARM 04/30/2012 1430   SPECREQUEST BOTTLES DRAWN AEROBIC AND ANAEROBIC 5CC 04/30/2012 1430   CULT        BLOOD CULTURE RECEIVED NO GROWTH TO  DATE CULTURE WILL BE HELD FOR 5 DAYS BEFORE ISSUING A FINAL NEGATIVE REPORT 04/30/2012 1430   REPTSTATUS PENDING 04/30/2012 1430    urine culture negative       Studies/Results: Dg Chest 2 View  04/30/2012  *RADIOLOGY REPORT*  Clinical Data: Fever, diabetes  CHEST - 2 VIEW  Comparison: Chest radiograph 05/11/2011  Findings: Cardiac silhouette is enlarged to a greater degree than comparison exam 1 year prior.  There is chronic elevation of the right hemidiaphragm.  There is central venous congestion.  No overt pulmonary edema.  Trace bilateral pleural effusions are present.  IMPRESSION: Enlarged cardiac silhouette, central venous congestion and small effusions.  Original Report Authenticated By: Genevive Bi, M.D.     Medications: Scheduled:   . allopurinol  150 mg Oral Daily  . amLODipine  10 mg Oral Daily  . calcitRIOL  0.5 mcg Oral Daily  . calcium carbonate  2 tablet Oral TID WC  . cefTRIAXone (ROCEPHIN)  IV  1 g Intravenous Q24H  . famotidine  20 mg Oral QHS  . fentaNYL  12.5 mcg Transdermal Q72H  . fentaNYL  50 mcg Transdermal Q72H  . furosemide  40 mg Oral BID  . gabapentin  400 mg Oral TID  . insulin aspart  0-9 Units Subcutaneous TID WC  .  insulin aspart  2 Units Subcutaneous Once  . insulin detemir  28 Units Subcutaneous Daily  . labetalol  300 mg Oral TID  . latanoprost  1 drop Right Eye QHS  . levothyroxine  224 mcg Oral Q breakfast  . magnesium oxide  400 mg Oral Daily  . mycophenolate  500 mg Oral BID  . predniSONE  10 mg Oral Q breakfast  . sirolimus  3 mg Oral Daily  . vancomycin  750 mg Intravenous Q24H  . Vitamin D (Ergocalciferol)  50,000 Units Oral Q Wed  . DISCONTD: allopurinol  150 mg Oral Q breakfast  . DISCONTD: amLODipine  10 mg Oral Q breakfast   Continuous:   . sodium chloride 75 mL/hr at 05/01/12 1438    Assessment/Plan: Active Problems: 1. Diabetes mellitus type 2, uncontrolled- will add prandial Novolog 4 units QAC.  Continue Levemir. 2.  Sepsis Syndrome- improving- continue broad spectrum antibiotics per ID.  Presumed urinary source though urine/blood cultures are negative (obtained while on Cipro) 3. UTI (lower urinary tract infection)- continue antibiotics per urology/ID. 4. Hypertension- continue home bp medications.   5. Acute on Chronic kidney disease, stage III (moderate)- improving with hydration.  Immunosuppressants per Renal  6. Metabolic Encephalopathy- resolved 7. Disposition- per ID/Urology based on antibiotic needs.   LOS: 2 days   Blia Totman,W DOUGLAS 05/02/2012, 10:27 AM

## 2012-05-03 LAB — GLUCOSE, CAPILLARY: Glucose-Capillary: 138 mg/dL — ABNORMAL HIGH (ref 70–99)

## 2012-05-03 MED ORDER — CEFDINIR 300 MG PO CAPS: 300.0000 mg | ORAL_CAPSULE | Freq: Two times a day (BID) | ORAL | Status: AC

## 2012-05-03 NOTE — Progress Notes (Signed)
Subjective: Ready to go home.   No complaints.  Feels better.  Objective: Vital signs in last 24 hours: Temp:  [98.3 F (36.8 C)-99.1 F (37.3 C)] 98.3 F (36.8 C) (07/14 0700) Pulse Rate:  [73-79] 73  (07/14 0700) Resp:  [18] 18  (07/14 0700) BP: (148-164)/(61-65) 164/64 mmHg (07/14 0700) SpO2:  [90 %-97 %] 90 % (07/14 0700) Weight change:  Last BM Date: 05/02/12  CBG (last 3)   Basename 05/03/12 0637 05/02/12 2157 05/02/12 1704  GLUCAP 138* 212* 136*    Intake/Output from previous day: 07/13 0701 - 07/14 0700 In: 1650.8 [P.O.:480; I.V.:1170.8] Out: 600 [Urine:600] Intake/Output this shift: Total I/O In: -  Out: 1800 [Urine:1800]  General appearance: alert and no distress Eyes: no scleral icterus Throat: oropharynx moist without erythema Resp: clear to auscultation bilaterally Cardio: regular rate and rhythm and grade 2/6 SEM GI: soft, non-tender; bowel sounds normal; no masses,  no organomegaly Extremities: no clubbing, cyanosis or edema   Lab Results:  [facility] 05/02/12 0656 05/01/12 0445  NA 140 139  K 3.5 3.7  CL 97 96  CO2 37* 37*  GLUCOSE 171* 159*  BUN 29* 34*  CREATININE 1.57* 1.95*  CALCIUM 8.8 8.6  MG -- 2.1  PHOS 2.8 3.3    Basename 05/02/12 0656 05/01/12 0445  AST -- 13  ALT -- 6  ALKPHOS -- 52  BILITOT -- 0.4  PROT -- 5.8*  ALBUMIN 3.0* 3.0*    Basename 05/02/12 0656 05/01/12 0445 04/30/12 1325  WBC 7.1 10.8* --  NEUTROABS -- -- 7.3  HGB 10.1* 9.8* --  HCT 33.7* 32.7* --  MCV 88.0 87.0 --  PLT 226 213 --   Lab Results  Component Value Date   INR 1.0 02/19/2009   INR 0.9 04/02/2007    Studies/Results: No results found.   Medications: Scheduled:   . allopurinol  150 mg Oral Daily  . amLODipine  10 mg Oral Daily  . calcitRIOL  0.5 mcg Oral Daily  . calcium carbonate  2 tablet Oral TID WC  . cefTRIAXone (ROCEPHIN)  IV  1 g Intravenous Q24H  . famotidine  20 mg Oral QHS  . fentaNYL  12.5 mcg Transdermal Q72H  . fentaNYL   50 mcg Transdermal Q72H  . furosemide  40 mg Oral BID  . gabapentin  400 mg Oral TID  . insulin aspart  0-9 Units Subcutaneous TID WC  . insulin aspart  4 Units Subcutaneous TID WC  . insulin detemir  28 Units Subcutaneous Daily  . labetalol  300 mg Oral TID  . latanoprost  1 drop Right Eye QHS  . levothyroxine  224 mcg Oral Q breakfast  . magnesium oxide  400 mg Oral Daily  . mycophenolate  500 mg Oral BID  . predniSONE  10 mg Oral Q breakfast  . sirolimus  3 mg Oral Daily  . vancomycin  750 mg Intravenous Q24H  . Vitamin D (Ergocalciferol)  50,000 Units Oral Q Wed   Continuous:   . DISCONTD: sodium chloride 1,000 mL (05/02/12 1841)    Assessment/Plan: Active Problems:  1. Diabetes mellitus type 2, uncontrolled- Sugars improved with control of her infection.  Continue Levemir and SSI. 2. Sepsis Syndrome-resolved- Presumed urinary source though urine/blood cultures are negative (obtained while on Cipro).  Urology discharging home on Cipro.  I will add Cefdinir as combination therapy as alternative to Ceftriaxone. 3. UTI (lower urinary tract infection)- continue antibiotics per urology. 4. Hypertension- continue home bp medications.  5. Acute on Chronic kidney disease, stage III (moderate)- improved with hydration. BMET being drawn now- will follow-up as outpatient.  Immunosuppressants per Renal  6. Metabolic Encephalopathy- resolved  7. Disposition- anticipating discharge today.    LOS: 3 days   Aaren Krog,W DOUGLAS 05/03/2012, 9:14 AM

## 2012-05-03 NOTE — Progress Notes (Signed)
I notify Dr. Allena Katz at this time that pt. Declines any further lab testing.  He states he is aware of this, and that pt. May be discharged.

## 2012-05-03 NOTE — Progress Notes (Signed)
She is awake, alert and in no distress.  Her very supportive and helpful husband gets her ready for discharge.  They verbalize understanding of foley care and perineal cleaning (with which her husband also capably helps her).  They further state that they have had to previously perform foley cath. Care at home.  She verbalizes that she was told by Dr. Vernie Ammons that she would need this foley for ~2 weeks; and that she has existing appointments this week with both her pcp and her urologist.  Her husband signs for receipt of her d/c instructions, and he also has the prescriptions from Dr. Clelia Croft.  She rides her own electric w/c to their vehicle without incident.

## 2012-05-03 NOTE — Discharge Summary (Signed)
Physician Discharge Summary  Patient ID: [name] MRN: [id number] DOB/AGE: [birth year] 73 y.o.  Admit date: 04/30/2012 Discharge date: 05/03/2012  Admission Diagnoses: Altered mental status with possible infection  Discharge Diagnoses: Altered mental status with possible infection Principal Problem:  *Sepsis syndrome Active Problems:  Heart murmur  Hypertension  Chronic kidney disease, stage III (moderate)  UTI (lower urinary tract infection)  Metabolic encephalopathy  Diabetes mellitus type 2, uncontrolled   Discharged Condition: good  Hospital Course: Patient was admitted to the hospital due altered mental status after having undergone a recent urethral diverticulectomy. Blood and urine cultures were obtained. Previous urine culture was found to be negative. She was started on broad-spectrum antibiotics with Rocephin and vancomycin and urine and blood cultures eventually came back negative. She began feeling better and his currently without complaint.  Consults: ID, nephrology and hospitalist  Discharge Exam: Blood pressure 164/64, pulse 73, temperature 98.3 F (36.8 C), temperature source Oral, resp. rate 18, height 5' 2.99" (1.6 m), weight 90.1 kg (198 lb 10.2 oz), SpO2 90.00%. General appearance: alert, cooperative and appears stated age. Her abdomen is soft and nontender. Her Foley catheter is in place and draining clear urine.  Disposition: 01-Home or Self Care  Discharge Orders    Future Orders Please Complete By Expires   Discontinue IV      Discharge patient        Medication List  As of 05/03/2012  8:04 AM   TAKE these medications         allopurinol 300 MG tablet   Commonly known as: ZYLOPRIM   Take 150 mg by mouth daily with breakfast.      amLODipine 10 MG tablet   Commonly known as: NORVASC   Take 10 mg by mouth daily with breakfast.      calcitRIOL 0.5 MCG capsule   Commonly known as: ROCALTROL   Take 0.5 mcg by mouth daily with breakfast.     calcium carbonate 500 MG chewable tablet   Commonly known as: TUMS - dosed in mg elemental calcium   Chew 2 tablets by mouth 3 (three) times daily with meals.      ciprofloxacin 250 MG tablet   Commonly known as: CIPRO   Take 250 mg by mouth 2 (two) times daily.      fentaNYL 50 MCG/HR   Commonly known as: DURAGESIC - dosed mcg/hr   Place 1 patch onto the skin every 3 (three) days.      fentaNYL 12 MCG/HR   Commonly known as: DURAGESIC - dosed mcg/hr   Place 1 patch onto the skin every 3 (three) days.      furosemide 40 MG tablet   Commonly known as: LASIX   Take 40 mg by mouth 2 (two) times daily.      gabapentin 400 MG capsule   Commonly known as: NEURONTIN   Take 400 mg by mouth 3 (three) times daily.      insulin detemir 100 UNIT/ML injection   Commonly known as: LEVEMIR   Inject 28 Units into the skin daily with breakfast.      insulin lispro 100 UNIT/ML injection   Commonly known as: HUMALOG   Inject 3-12 Units into the skin 3 (three) times daily before meals. Pt is on a sliding scale      ketoconazole 2 % cream   Commonly known as: NIZORAL   Apply 1 application topically 2 (two) times daily.      labetalol 200 MG tablet  Commonly known as: NORMODYNE   Take 300 mg by mouth 3 (three) times daily.      latanoprost 0.005 % ophthalmic solution   Commonly known as: XALATAN   Place 1 drop into the right eye at bedtime.      levothyroxine 112 MCG tablet   Commonly known as: SYNTHROID, LEVOTHROID   Take 224 mcg by mouth daily with breakfast.      magnesium oxide 400 MG tablet   Commonly known as: MAG-OX   Take 400 mg by mouth daily with breakfast.      mycophenolate 250 MG capsule   Commonly known as: CELLCEPT   Take 500 mg by mouth 2 (two) times daily.      predniSONE 5 MG tablet   Commonly known as: DELTASONE   Take 10 mg by mouth daily with breakfast.      ranitidine 150 MG tablet   Commonly known as: ZANTAC   Take 150 mg by mouth daily.       sirolimus 1 MG tablet   Commonly known as: RAPAMUNE   Take 3 mg by mouth daily. Pt takes three 1 mg tablets = 3 mg daily      traMADol 50 MG tablet   Commonly known as: ULTRAM   Take 50 mg by mouth every 6 (six) hours as needed. pain      Vitamin D (Ergocalciferol) 50000 UNITS Caps   Commonly known as: DRISDOL   Take 50,000 Units by mouth every 7 (seven) days. On Wednesdays            she will be discharged home today with followup with Dr. Mena Goes for a cystogram and Foley catheter removal next week. She will remain on Cipro 250 mg twice a day.  SignedGarnett Farm 05/03/2012, 8:04 AM

## 2012-05-05 LAB — ANAEROBIC CULTURE: Gram Stain: NONE SEEN

## 2012-05-06 LAB — CULTURE, BLOOD (ROUTINE X 2)
Culture: NO GROWTH
Culture: NO GROWTH

## 2012-05-12 ENCOUNTER — Other Ambulatory Visit (HOSPITAL_COMMUNITY): Payer: Self-pay | Admitting: Urology

## 2012-05-12 DIAGNOSIS — M549 Dorsalgia, unspecified: Secondary | ICD-10-CM

## 2012-05-13 ENCOUNTER — Other Ambulatory Visit (HOSPITAL_COMMUNITY): Payer: Self-pay | Admitting: Urology

## 2012-05-13 DIAGNOSIS — M549 Dorsalgia, unspecified: Secondary | ICD-10-CM

## 2012-05-26 ENCOUNTER — Ambulatory Visit (HOSPITAL_COMMUNITY)
Admission: RE | Admit: 2012-05-26 | Discharge: 2012-05-26 | Disposition: A | Discharge status: Home / Self Care | Payer: Medicare Other | Source: Ambulatory Visit | Attending: Urology | Admitting: Urology

## 2012-05-26 DIAGNOSIS — N361 Urethral diverticulum: Secondary | ICD-10-CM | POA: Insufficient documentation

## 2012-05-26 DIAGNOSIS — M549 Dorsalgia, unspecified: Secondary | ICD-10-CM

## 2012-05-26 MED ORDER — DIATRIZOATE MEGLUMINE 30 % UR SOLN: Freq: Once | URETHRAL | Status: AC | PRN

## 2012-06-02 ENCOUNTER — Emergency Department (HOSPITAL_COMMUNITY): Payer: Medicare Other

## 2012-06-02 ENCOUNTER — Encounter (HOSPITAL_COMMUNITY): Payer: Self-pay | Admitting: Emergency Medicine

## 2012-06-02 ENCOUNTER — Inpatient Hospital Stay (HOSPITAL_COMMUNITY)
Admission: EM | Admit: 2012-06-02 | Discharge: 2012-06-05 | DRG: 689 | Disposition: A | Discharge status: Home / Self Care | Payer: Medicare Other | Attending: Endocrinology | Admitting: Endocrinology

## 2012-06-02 DIAGNOSIS — Z79899 Other long term (current) drug therapy: Secondary | ICD-10-CM

## 2012-06-02 DIAGNOSIS — Z87891 Personal history of nicotine dependence: Secondary | ICD-10-CM

## 2012-06-02 DIAGNOSIS — Z794 Long term (current) use of insulin: Secondary | ICD-10-CM

## 2012-06-02 DIAGNOSIS — Z880 Allergy status to penicillin: Secondary | ICD-10-CM

## 2012-06-02 DIAGNOSIS — M129 Arthropathy, unspecified: Secondary | ICD-10-CM | POA: Diagnosis present

## 2012-06-02 DIAGNOSIS — E1142 Type 2 diabetes mellitus with diabetic polyneuropathy: Secondary | ICD-10-CM | POA: Diagnosis present

## 2012-06-02 DIAGNOSIS — IMO0002 Reserved for concepts with insufficient information to code with codable children: Secondary | ICD-10-CM

## 2012-06-02 DIAGNOSIS — G9341 Metabolic encephalopathy: Secondary | ICD-10-CM | POA: Diagnosis present

## 2012-06-02 DIAGNOSIS — E876 Hypokalemia: Secondary | ICD-10-CM | POA: Diagnosis present

## 2012-06-02 DIAGNOSIS — E1149 Type 2 diabetes mellitus with other diabetic neurological complication: Secondary | ICD-10-CM | POA: Diagnosis present

## 2012-06-02 DIAGNOSIS — Z91041 Radiographic dye allergy status: Secondary | ICD-10-CM

## 2012-06-02 DIAGNOSIS — K219 Gastro-esophageal reflux disease without esophagitis: Secondary | ICD-10-CM | POA: Diagnosis present

## 2012-06-02 DIAGNOSIS — R011 Cardiac murmur, unspecified: Secondary | ICD-10-CM | POA: Diagnosis present

## 2012-06-02 DIAGNOSIS — Z886 Allergy status to analgesic agent status: Secondary | ICD-10-CM

## 2012-06-02 DIAGNOSIS — Z885 Allergy status to narcotic agent status: Secondary | ICD-10-CM

## 2012-06-02 DIAGNOSIS — R4182 Altered mental status, unspecified: Secondary | ICD-10-CM

## 2012-06-02 DIAGNOSIS — N183 Chronic kidney disease, stage 3 unspecified: Secondary | ICD-10-CM | POA: Diagnosis present

## 2012-06-02 DIAGNOSIS — E1165 Type 2 diabetes mellitus with hyperglycemia: Secondary | ICD-10-CM | POA: Diagnosis present

## 2012-06-02 DIAGNOSIS — I10 Essential (primary) hypertension: Secondary | ICD-10-CM | POA: Diagnosis present

## 2012-06-02 DIAGNOSIS — H44009 Unspecified purulent endophthalmitis, unspecified eye: Secondary | ICD-10-CM | POA: Diagnosis present

## 2012-06-02 DIAGNOSIS — Z940 Kidney transplant status: Secondary | ICD-10-CM

## 2012-06-02 DIAGNOSIS — I129 Hypertensive chronic kidney disease with stage 1 through stage 4 chronic kidney disease, or unspecified chronic kidney disease: Secondary | ICD-10-CM | POA: Diagnosis present

## 2012-06-02 DIAGNOSIS — Z881 Allergy status to other antibiotic agents status: Secondary | ICD-10-CM

## 2012-06-02 DIAGNOSIS — N390 Urinary tract infection, site not specified: Principal | ICD-10-CM | POA: Diagnosis present

## 2012-06-02 DIAGNOSIS — E039 Hypothyroidism, unspecified: Secondary | ICD-10-CM | POA: Diagnosis present

## 2012-06-02 LAB — CBC WITH DIFFERENTIAL/PLATELET
Basophils Relative: 0 % (ref 0–1)
Eosinophils Absolute: 0.1 10*3/uL (ref 0.0–0.7)
Hemoglobin: 12 g/dL (ref 12.0–15.0)
Lymphs Abs: 1 10*3/uL (ref 0.7–4.0)
MCH: 26.4 pg (ref 26.0–34.0)
Monocytes Relative: 5 % (ref 3–12)
Neutro Abs: 7.4 10*3/uL (ref 1.7–7.7)
Neutrophils Relative %: 83 % — ABNORMAL HIGH (ref 43–77)
Platelets: 157 10*3/uL (ref 150–400)
RBC: 4.54 MIL/uL (ref 3.87–5.11)

## 2012-06-02 LAB — URINE MICROSCOPIC-ADD ON

## 2012-06-02 LAB — BASIC METABOLIC PANEL
Chloride: 98 mEq/L (ref 96–112)
GFR calc Af Amer: 33 mL/min — ABNORMAL LOW (ref >90)
GFR calc non Af Amer: 28 mL/min — ABNORMAL LOW (ref >90)
Glucose, Bld: 222 mg/dL — ABNORMAL HIGH (ref 70–99)
Potassium: 3.3 mEq/L — ABNORMAL LOW (ref 3.5–5.1)
Sodium: 144 mEq/L (ref 135–145)

## 2012-06-02 LAB — GLUCOSE, CAPILLARY
Glucose-Capillary: 208 mg/dL — ABNORMAL HIGH (ref 70–99)
Glucose-Capillary: 256 mg/dL — ABNORMAL HIGH (ref 70–99)

## 2012-06-02 LAB — URINALYSIS, ROUTINE W REFLEX MICROSCOPIC
Nitrite: NEGATIVE
Specific Gravity, Urine: 1.017 (ref 1.005–1.030)
Urobilinogen, UA: 0.2 mg/dL (ref 0.0–1.0)
pH: 6 (ref 5.0–8.0)

## 2012-06-02 MED ORDER — SIROLIMUS 1 MG PO TABS
3.0000 mg | ORAL_TABLET | Freq: Every day | ORAL | Status: DC
  Administered 2012-06-03 – 2012-06-05 (×3): 3 mg via ORAL
  Filled 2012-06-02 (×3): qty 3

## 2012-06-02 MED ORDER — AMLODIPINE BESYLATE 10 MG PO TABS
10.0000 mg | ORAL_TABLET | Freq: Every day | ORAL | Status: DC
  Administered 2012-06-03 – 2012-06-05 (×3): 10 mg via ORAL
  Filled 2012-06-02 (×4): qty 1

## 2012-06-02 MED ORDER — FAMOTIDINE 20 MG PO TABS
20.0000 mg | ORAL_TABLET | Freq: Every day | ORAL | Status: DC
  Administered 2012-06-03 – 2012-06-05 (×3): 20 mg via ORAL
  Filled 2012-06-02 (×4): qty 1

## 2012-06-02 MED ORDER — FENTANYL 12 MCG/HR TD PT72
12.5000 ug | MEDICATED_PATCH | TRANSDERMAL | Status: DC
  Administered 2012-06-04: 12.5 ug via TRANSDERMAL
  Filled 2012-06-02: qty 1

## 2012-06-02 MED ORDER — INSULIN ASPART 100 UNIT/ML ~~LOC~~ SOLN: 0.0000 [IU] | Freq: Every day | SUBCUTANEOUS | Status: DC

## 2012-06-02 MED ORDER — ACETAMINOPHEN 325 MG PO TABS: 650.0000 mg | ORAL_TABLET | Freq: Four times a day (QID) | ORAL | Status: DC | PRN

## 2012-06-02 MED ORDER — FENTANYL 50 MCG/HR TD PT72
50.0000 ug | MEDICATED_PATCH | TRANSDERMAL | Status: DC
  Administered 2012-06-04: 50 ug via TRANSDERMAL
  Filled 2012-06-02: qty 1

## 2012-06-02 MED ORDER — LEVOTHYROXINE SODIUM 112 MCG PO TABS
224.0000 ug | ORAL_TABLET | Freq: Every day | ORAL | Status: DC
  Administered 2012-06-03 – 2012-06-05 (×3): 224 ug via ORAL
  Filled 2012-06-02 (×4): qty 2

## 2012-06-02 MED ORDER — KETOCONAZOLE 2 % EX CREA: 1.0000 "application " | TOPICAL_CREAM | Freq: Every day | CUTANEOUS | Status: DC | PRN

## 2012-06-02 MED ORDER — PREDNISONE 10 MG PO TABS
10.0000 mg | ORAL_TABLET | Freq: Every day | ORAL | Status: DC
  Administered 2012-06-03 – 2012-06-05 (×3): 10 mg via ORAL
  Filled 2012-06-02 (×4): qty 1

## 2012-06-02 MED ORDER — VANCOMYCIN HCL IN DEXTROSE 1-5 GM/200ML-% IV SOLN
1000.0000 mg | Freq: Once | INTRAVENOUS | Status: AC
  Administered 2012-06-02: 1000 mg via INTRAVENOUS
  Filled 2012-06-02: qty 200

## 2012-06-02 MED ORDER — DEXTROSE 5 % IV SOLN
1.0000 g | Freq: Three times a day (TID) | INTRAVENOUS | Status: DC
  Administered 2012-06-02 – 2012-06-05 (×9): 1 g via INTRAVENOUS
  Filled 2012-06-02 (×14): qty 1

## 2012-06-02 MED ORDER — VITAMIN D (ERGOCALCIFEROL) 1.25 MG (50000 UNIT) PO CAPS
50000.0000 [IU] | ORAL_CAPSULE | ORAL | Status: DC
  Administered 2012-06-03: 50000 [IU] via ORAL
  Filled 2012-06-02: qty 1

## 2012-06-02 MED ORDER — MAGNESIUM OXIDE 400 (241.3 MG) MG PO TABS
400.0000 mg | ORAL_TABLET | Freq: Every day | ORAL | Status: DC
  Administered 2012-06-03 – 2012-06-05 (×3): 400 mg via ORAL
  Filled 2012-06-02 (×4): qty 1

## 2012-06-02 MED ORDER — LATANOPROST 0.005 % OP SOLN
1.0000 [drp] | Freq: Every day | OPHTHALMIC | Status: DC
  Administered 2012-06-02 – 2012-06-04 (×3): 1 [drp] via OPHTHALMIC
  Filled 2012-06-02: qty 2.5

## 2012-06-02 MED ORDER — MYCOPHENOLATE MOFETIL 250 MG PO CAPS
500.0000 mg | ORAL_CAPSULE | Freq: Two times a day (BID) | ORAL | Status: DC
  Administered 2012-06-02 – 2012-06-05 (×6): 500 mg via ORAL
  Filled 2012-06-02 (×7): qty 2

## 2012-06-02 MED ORDER — CALCITRIOL 0.5 MCG PO CAPS
0.5000 ug | ORAL_CAPSULE | Freq: Every day | ORAL | Status: DC
  Administered 2012-06-03 – 2012-06-05 (×3): 0.5 ug via ORAL
  Filled 2012-06-02 (×4): qty 1

## 2012-06-02 MED ORDER — ALLOPURINOL 150 MG HALF TABLET
150.0000 mg | ORAL_TABLET | Freq: Every day | ORAL | Status: DC
  Administered 2012-06-03 – 2012-06-05 (×3): 150 mg via ORAL
  Filled 2012-06-02 (×4): qty 1

## 2012-06-02 MED ORDER — FUROSEMIDE 40 MG PO TABS
40.0000 mg | ORAL_TABLET | Freq: Two times a day (BID) | ORAL | Status: DC
  Administered 2012-06-02 – 2012-06-05 (×6): 40 mg via ORAL
  Filled 2012-06-02 (×8): qty 1

## 2012-06-02 MED ORDER — ACETAMINOPHEN 650 MG RE SUPP: 650.0000 mg | Freq: Four times a day (QID) | RECTAL | Status: DC | PRN

## 2012-06-02 MED ORDER — INSULIN DETEMIR 100 UNIT/ML ~~LOC~~ SOLN
28.0000 [IU] | Freq: Every day | SUBCUTANEOUS | Status: DC
  Administered 2012-06-03 – 2012-06-05 (×3): 28 [IU] via SUBCUTANEOUS
  Filled 2012-06-02: qty 10

## 2012-06-02 MED ORDER — GABAPENTIN 400 MG PO CAPS
400.0000 mg | ORAL_CAPSULE | Freq: Three times a day (TID) | ORAL | Status: DC
  Administered 2012-06-02 – 2012-06-05 (×8): 400 mg via ORAL
  Filled 2012-06-02 (×10): qty 1

## 2012-06-02 MED ORDER — INSULIN ASPART 100 UNIT/ML ~~LOC~~ SOLN
0.0000 [IU] | Freq: Three times a day (TID) | SUBCUTANEOUS | Status: DC
  Administered 2012-06-02: 8 [IU] via SUBCUTANEOUS
  Administered 2012-06-03: 4 [IU] via SUBCUTANEOUS
  Filled 2012-06-02: qty 1

## 2012-06-02 MED ORDER — CALCIUM CARBONATE ANTACID 500 MG PO CHEW
2.0000 | CHEWABLE_TABLET | Freq: Three times a day (TID) | ORAL | Status: DC
  Administered 2012-06-03 – 2012-06-05 (×7): 400 mg via ORAL
  Filled 2012-06-02 (×10): qty 2

## 2012-06-02 MED ORDER — MAGNESIUM OXIDE 400 MG PO TABS: 400.0000 mg | ORAL_TABLET | Freq: Every day | ORAL | Status: DC

## 2012-06-02 MED ORDER — SODIUM CHLORIDE 0.9 % IV SOLN
750.0000 mg | INTRAVENOUS | Status: DC
  Administered 2012-06-03 – 2012-06-04 (×2): 750 mg via INTRAVENOUS
  Filled 2012-06-02 (×3): qty 750

## 2012-06-02 MED ORDER — LABETALOL HCL 300 MG PO TABS
300.0000 mg | ORAL_TABLET | Freq: Three times a day (TID) | ORAL | Status: DC
  Administered 2012-06-02 – 2012-06-05 (×8): 300 mg via ORAL
  Filled 2012-06-02 (×10): qty 1

## 2012-06-02 NOTE — Consult Note (Signed)
Urology Consult  Referring physician: Timothy Lasso Reason for referral: UTI  Chief Complaint: Chronic Foley  History of Present Illness: 25 YOF with history of recurrent UTI, urosepsis previously maintained on prophylactic nitrofurantoin. 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 WBC 9,000, abnormal u/a. Foley still in place.     Past Medical History  Diagnosis Date  . Diabetes mellitus   . Diabetic neuropathy   . Hypothyroidism   . Hypertension   . GERD (gastroesophageal reflux disease)   . Heart murmur     not treated for  . Arthritis   . Chronic kidney disease     RENAL TRANSPLANT IN 2008--PT IS FOLLOWED BY DR. Karie Fetch WITH BUN OF 37 AND CREAT 1.7--PER NOTE DR. NESI FROM 04/11/12  . Urethral diverticulum   . Eyesight diminished     GLAUCOMA   . History of shingles     LEFT EYE JAN 2012--STILL HAS EYE PAIN-STATES NOT ABLE TO TAKE THE MEDICATION FOR SHINGLES BECAUSE OF HER HX OF KIDNEY TRANSPLANT  . Fingernail abnormalities     FUNGUS OF FINGERNAILS   Past Surgical History  Procedure Date  . Kidney transplant 2008    At [facility]  . Back surgery     ruptured disk  . Abdominal hysterectomy   . Appendectomy   . Laser surgery of both eyes for hemorrhages   . 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 Urethral diverticulectomy 04/28/2012    Procedure: URETHRAL DIVERTICULECTOMY;  Surgeon: Antony Haste, MD;  Location: [facility];  Service: Urology;  Laterality: N/A;    Medications: I have reviewed the patient's current medications. Allergies:  Allergies  Allergen Reactions  . Adhesive (Tape)   . Aspirin     Crawling feeling under skin  . Hydrocodone Other (See Comments)    Hallucinations   . Iohexol      Desc: pt unable  to tell what the reaction was   . Oxycodone   . Penicillins Hives and Swelling    Has tolerated cefazolin and ceftriaxone.  . Tacrolimus Other (See Comments)    Hallucinations   . Contrast Media (Iodinated Diagnostic Agents) Hives, Swelling and Rash    Family History  Problem Relation Age of Onset  . Anesthesia problems Neg Hx    Social History:  reports that she quit smoking about 33 years ago. She has never used smokeless tobacco. She reports that she does not drink alcohol or use illicit drugs.  ROS: All systems are reviewed and negative except as noted.   Physical Exam:  Vital signs in last 24 hours: Temp:  [98.2 F (36.8 C)-99.4 F (37.4 C)] 98.2 F (36.8 C) (08/13 2210) Pulse Rate:  [51-76] 73  (08/13 2210) Resp:  [11-29] 19  (08/13 2210) BP: (129-181)/(55-114) 140/96 mmHg (08/13 2210) SpO2:  [90 %-100 %] 94 % (08/13 2210) Weight:  [90.1 kg (198 lb 10.2 oz)-90.357 kg (199 lb 3.2 oz)] 90.357 kg (199 lb 3.2 oz) (08/13 2210)  Cardiovascular: Skin warm; not flushed Respiratory: Breaths quiet; no shortness of breath Abdomen: No masses Neurological: Normal sensation to touch Musculoskeletal: Normal motor function arms and legs Lymphatics: No inguinal adenopathy Skin: No rashes Genitourinary:wnl. Foley in place.   Laboratory Data:  Results for orders placed during the  hospital encounter of 06/02/12 (from the past 72 hour(s))  CBC WITH DIFFERENTIAL     Status: Abnormal   Collection Time   06/02/12 11:41 AM      Component Value Range Comment   WBC 9.0  4.0 - 10.5 K/uL    RBC 4.54  3.87 - 5.11 MIL/uL    Hemoglobin 12.0  12.0 - 15.0 g/dL    HCT 16.1  09.6 - 04.5 %    MCV 87.9  78.0 - 100.0 fL    MCH 26.4  26.0 - 34.0 pg    MCHC 30.1  30.0 - 36.0 g/dL    RDW 40.9 (*) 81.1 - 15.5 %    Platelets 157  150 - 400 K/uL    Neutrophils Relative 83 (*) 43 - 77 %    Neutro Abs 7.4  1.7 - 7.7 K/uL    Lymphocytes Relative 11 (*) 12 - 46 %    Lymphs Abs 1.0  0.7 - 4.0 K/uL     Monocytes Relative 5  3 - 12 %    Monocytes Absolute 0.5  0.1 - 1.0 K/uL    Eosinophils Relative 1  0 - 5 %    Eosinophils Absolute 0.1  0.0 - 0.7 K/uL    Basophils Relative 0  0 - 1 %    Basophils Absolute 0.0  0.0 - 0.1 K/uL   BASIC METABOLIC PANEL     Status: Abnormal   Collection Time   06/02/12 11:41 AM      Component Value Range Comment   Sodium 144  135 - 145 mEq/L    Potassium 3.3 (*) 3.5 - 5.1 mEq/L    Chloride 98  96 - 112 mEq/L    CO2 38 (*) 19 - 32 mEq/L    Glucose, Bld 222 (*) 70 - 99 mg/dL    BUN 30 (*) 6 - 23 mg/dL    Creatinine, Ser 9.14 (*) 0.50 - 1.10 mg/dL    Calcium 9.1  8.4 - 78.2 mg/dL    GFR calc non Af Amer 28 (*) >90 mL/min    GFR calc Af Amer 33 (*) >90 mL/min   URINALYSIS, ROUTINE W REFLEX MICROSCOPIC     Status: Abnormal   Collection Time   06/02/12  1:06 PM      Component Value Range Comment   Color, Urine YELLOW  YELLOW    APPearance CLOUDY (*) CLEAR    Specific Gravity, Urine 1.017  1.005 - 1.030    pH 6.0  5.0 - 8.0    Glucose, UA 100 (*) NEGATIVE mg/dL    Hgb urine dipstick LARGE (*) NEGATIVE    Bilirubin Urine NEGATIVE  NEGATIVE    Ketones, ur NEGATIVE  NEGATIVE mg/dL    Protein, ur 956 (*) NEGATIVE mg/dL    Urobilinogen, UA 0.2  0.0 - 1.0 mg/dL    Nitrite NEGATIVE  NEGATIVE    Leukocytes, UA LARGE (*) NEGATIVE   URINE MICROSCOPIC-ADD ON     Status: Abnormal   Collection Time   06/02/12  1:06 PM      Component Value Range Comment   Squamous Epithelial / LPF FEW (*) RARE    WBC, UA TOO NUMEROUS TO COUNT  <3 WBC/hpf    RBC / HPF 11-20  <3 RBC/hpf    Bacteria, UA FEW (*) RARE    Urine-Other MANY YEAST     GLUCOSE, CAPILLARY     Status: Abnormal   Collection Time   06/02/12  2:35 PM      Component Value Range Comment   Glucose-Capillary 208 (*) 70 - 99 mg/dL   GLUCOSE, CAPILLARY     Status: Abnormal   Collection Time   06/02/12  7:01 PM      Component Value Range Comment   Glucose-Capillary 256 (*) 70 - 99 mg/dL   GLUCOSE, CAPILLARY      Status: Normal   Collection Time   06/02/12  9:55 PM      Component Value Range Comment   Glucose-Capillary 85  70 - 99 mg/dL    Comment 1 Documented in Chart      Comment 2 Notify RN      No results found for this or any previous visit (from the past 240 hour(s)). Creatinine:  Basename 06/02/12 1141  CREATININE 1.71*    Xrays: See report/chart   Impression/Assessment:  Complex UTI in pt with foley post urethral diverticulectomy. I will ck Dr. Mena Goes' notes ( He is on vacation ) and find out what his plans for the foley are.   Plan:  Will follow up in AM.   Mahlon Gabrielle I 06/02/2012, 11:40 PM

## 2012-06-02 NOTE — ED Notes (Signed)
Patient noted to have

## 2012-06-02 NOTE — H&P (Signed)
[name] is an 71 y.o. female.   PCP:   Julian Hy, MD   Chief Complaint:  Confusion/AMS/Lethargy, UTI  HPI: 58 F with MMP whose family called in today and stated that her blood sugars have been up the past 2-3 days but it's fine this am at 104.  Then went onto state that she has been dropping things, not able to grip things.  She's not responding when you say something to her, you have to keep repeating.  She is jerking all over also.  She had similar issues 7/31 and saw Dr Mena Goes @ 05/21/12.  We sent her to ED for eval of Confusion/AMS/Lethargy and Rx and to ?rule out CVA or Dx infection.  In ED she was found to have a complicated UTI and because her last one was treated with Aztreonam and Vanco - these Abx were again chosen and used for Treatment.  She has an indwelling Foley catheter after a bladder diverticulum surgery. Slight fevers. No cough. She is a kidney transplant patient.  WBC 9. Cr 1.7. UA +. CXR (-).  CCT not needed and not done.  No CP or SOB.  Mentally back to baseline..  On review of chart she had similar admit 7/11 - 7/14 for Urosepsis and Rxed with IV Abx.  Cr on 7/12 was 1.95  She will be admitted again. Her BPs are fine.     Past Medical History:  Past Medical History  Diagnosis Date  . Diabetes mellitus   . Diabetic neuropathy   . Hypothyroidism   . Hypertension   . GERD (gastroesophageal reflux disease)   . Heart murmur     not treated for  . Arthritis   . Chronic kidney disease     RENAL TRANSPLANT IN 2008--PT IS FOLLOWED BY DR. Karie Fetch WITH BUN OF 37 AND CREAT 1.7--PER NOTE DR. NESI FROM 04/11/12  . Urethral diverticulum   . Eyesight diminished     GLAUCOMA   . History of shingles     LEFT EYE JAN 2012--STILL HAS EYE PAIN-STATES NOT ABLE TO TAKE THE MEDICATION FOR SHINGLES BECAUSE OF HER HX OF KIDNEY TRANSPLANT  . Fingernail abnormalities     FUNGUS OF FINGERNAILS    Past Surgical History  Procedure Date  . Kidney transplant  2008    At [facility]  . Back surgery     ruptured disk  . Abdominal hysterectomy   . Appendectomy   . Laser surgery of both eyes for hemorrhages   . 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 Urethral diverticulectomy 04/28/2012    Procedure: URETHRAL DIVERTICULECTOMY;  Surgeon: Antony Haste, MD;  Location: [facility];  Service: Urology;  Laterality: N/A;      Allergies:   Allergies  Allergen Reactions  . Adhesive (Tape)   . Aspirin     Crawling feeling under skin  . Hydrocodone Other (See Comments)    Hallucinations   . Iohexol      Desc: pt unable to tell what the reaction was   . Oxycodone   . Penicillins Hives and Swelling    Has tolerated cefazolin and ceftriaxone.  . Tacrolimus Other (See Comments)    Hallucinations   . Contrast Media (Iodinated Diagnostic Agents) Hives, Swelling and Rash     Medications: Prior to Admission medications   Medication Sig Start Date End Date Taking? Authorizing Provider  allopurinol (ZYLOPRIM) 300 MG tablet Take 150 mg by mouth daily with breakfast.    Yes  Historical Provider, MD  amLODipine (NORVASC) 10 MG tablet Take 10 mg by mouth daily with breakfast.    Yes Historical Provider, MD  calcitRIOL (ROCALTROL) 0.5 MCG capsule Take 0.5 mcg by mouth daily with breakfast.    Yes Historical Provider, MD  calcium carbonate (TUMS - DOSED IN MG ELEMENTAL CALCIUM) 500 MG chewable tablet Chew 2 tablets by mouth 3 (three) times daily with meals.   Yes Historical Provider, MD  fentaNYL (DURAGESIC - DOSED MCG/HR) 12 MCG/HR Place 1 patch onto the skin every 3 (three) days.   Yes Historical Provider, MD  fentaNYL (DURAGESIC - DOSED MCG/HR) 50 MCG/HR Place 1 patch onto the skin every 3 (three) days.   Yes Historical Provider, MD  furosemide (LASIX) 40 MG tablet Take 40 mg by mouth 2 (two) times daily.   Yes Historical Provider, MD  gabapentin (NEURONTIN) 400 MG capsule Take 400 mg by mouth 3 (three) times daily.   Yes Historical Provider, MD    insulin detemir (LEVEMIR) 100 UNIT/ML injection Inject 28 Units into the skin daily with breakfast.    Yes Historical Provider, MD  insulin lispro (HUMALOG) 100 UNIT/ML injection Inject 3-12 Units into the skin 3 (three) times daily before meals. Pt is on a sliding scale   Yes Historical Provider, MD  ketoconazole (NIZORAL) 2 % cream Apply 1 application topically 2 (two) times daily.   Yes Historical Provider, MD  labetalol (NORMODYNE) 200 MG tablet Take 300 mg by mouth 3 (three) times daily.    Yes Historical Provider, MD  latanoprost (XALATAN) 0.005 % ophthalmic solution Place 1 drop into the right eye at bedtime.   Yes Historical Provider, MD  levothyroxine (SYNTHROID, LEVOTHROID) 112 MCG tablet Take 224 mcg by mouth daily with breakfast.   Yes Historical Provider, MD  magnesium oxide (MAG-OX) 400 MG tablet Take 400 mg by mouth daily.    Yes Historical Provider, MD  mycophenolate (CELLCEPT) 250 MG capsule Take 500 mg by mouth 2 (two) times daily.   Yes Historical Provider, MD  predniSONE (DELTASONE) 5 MG tablet Take 10 mg by mouth daily with breakfast.    Yes Historical Provider, MD  ranitidine (ZANTAC) 150 MG tablet Take 150 mg by mouth daily.   Yes Historical Provider, MD  sirolimus (RAPAMUNE) 1 MG tablet Take 3 mg by mouth daily. Pt takes three 1 mg tablets = 3 mg daily   Yes Historical Provider, MD  Vitamin D, Ergocalciferol, (DRISDOL) 50000 UNITS CAPS Take 50,000 Units by mouth every 7 (seven) days. On Wednesdays   Yes Historical Provider, MD      (Not in a hospital admission)   Social History:  reports that she quit smoking about 33 years ago. She has never used smokeless tobacco. She reports that she does not drink alcohol or use illicit drugs.  Family History: Family History  Problem Relation Age of Onset  . Anesthesia problems Neg Hx     Review of Systems:  Review of Systems - See HPI. All other ROS obtained and discussed and O/w (-) Dry parched mouth.  Physical Exam:   Blood pressure 149/61, pulse 75, temperature 98.5 F (36.9 C), temperature source Oral, resp. rate 16, height 5' 2.99" (1.6 m), weight 90.1 kg (198 lb 10.2 oz), SpO2 100.00%. Filed Vitals:   06/02/12 1500 06/02/12 1600 06/02/12 1700 06/02/12 1734  BP: 170/72 147/57 129/57 149/61  Pulse: 59 69 51 75  Temp:    98.5 F (36.9 C)  TempSrc:    Oral  Resp: 28 11 29 16   Height: 5' 2.99" (1.6 m)     Weight: 90.1 kg (198 lb 10.2 oz)     SpO2: 100% 99% 100% 100%   General appearance: Alert.  Sick appearing. Head: Normocephalic, without obvious abnormality, atraumatic Eyes: conjunctivae/corneas clear. PERRL, EOM's intact.  Nose: Nares normal. Septum midline. Mucosa normal. No drainage or sinus tenderness. Throat: lips, mucosa, and tongue dry Neck: no adenopathy, no carotid bruit, no JVD and thyroid not enlarged, symmetric, no tenderness/mass/nodules Resp: CTA Cardio: Reg with m GI: soft, non-tender; bowel sounds normal; no masses,  no organomegaly Extremities: extremities normal, atraumatic, no cyanosis or edema Lymph nodes:  no cervical lymphadenopathy Neurologic: Alert and oriented X 3, normal strength and tone. Normal symmetric reflexes. Fidgety. No current confusion.     Labs on Admission:   [facility] 06/02/12 1141  NA 144  K 3.3*  CL 98  CO2 38*  GLUCOSE 222*  BUN 30*  CREATININE 1.71*  CALCIUM 9.1  MG --  PHOS --   No results found for this basename: AST:2,ALT:2,ALKPHOS:2,BILITOT:2,PROT:2,ALBUMIN:2 in the last 72 hours No results found for this basename: LIPASE:2,AMYLASE:2 in the last 72 hours  Basename 06/02/12 1141  WBC 9.0  NEUTROABS 7.4  HGB 12.0  HCT 39.9  MCV 87.9  PLT 157   No results found for this basename: CKTOTAL:3,CKMB:3,CKMBINDEX:3,TROPONINI:3 in the last 72 hours Lab Results  Component Value Date   INR 1.0 02/19/2009   INR 0.9 04/02/2007     LAB RESULT POCT:  Results for orders placed during the hospital encounter of 06/02/12  CBC WITH  DIFFERENTIAL      Component Value Range   WBC 9.0  4.0 - 10.5 K/uL   RBC 4.54  3.87 - 5.11 MIL/uL   Hemoglobin 12.0  12.0 - 15.0 g/dL   HCT 40.9  81.1 - 91.4 %   MCV 87.9  78.0 - 100.0 fL   MCH 26.4  26.0 - 34.0 pg   MCHC 30.1  30.0 - 36.0 g/dL   RDW 78.2 (*) 95.6 - 21.3 %   Platelets 157  150 - 400 K/uL   Neutrophils Relative 83 (*) 43 - 77 %   Neutro Abs 7.4  1.7 - 7.7 K/uL   Lymphocytes Relative 11 (*) 12 - 46 %   Lymphs Abs 1.0  0.7 - 4.0 K/uL   Monocytes Relative 5  3 - 12 %   Monocytes Absolute 0.5  0.1 - 1.0 K/uL   Eosinophils Relative 1  0 - 5 %   Eosinophils Absolute 0.1  0.0 - 0.7 K/uL   Basophils Relative 0  0 - 1 %   Basophils Absolute 0.0  0.0 - 0.1 K/uL  BASIC METABOLIC PANEL      Component Value Range   Sodium 144  135 - 145 mEq/L   Potassium 3.3 (*) 3.5 - 5.1 mEq/L   Chloride 98  96 - 112 mEq/L   CO2 38 (*) 19 - 32 mEq/L   Glucose, Bld 222 (*) 70 - 99 mg/dL   BUN 30 (*) 6 - 23 mg/dL   Creatinine, Ser 0.86 (*) 0.50 - 1.10 mg/dL   Calcium 9.1  8.4 - 57.8 mg/dL   GFR calc non Af Amer 28 (*) >90 mL/min   GFR calc Af Amer 33 (*) >90 mL/min  URINALYSIS, ROUTINE W REFLEX MICROSCOPIC      Component Value Range   Color, Urine YELLOW  YELLOW   APPearance CLOUDY (*) CLEAR  Specific Gravity, Urine 1.017  1.005 - 1.030   pH 6.0  5.0 - 8.0   Glucose, UA 100 (*) NEGATIVE mg/dL   Hgb urine dipstick LARGE (*) NEGATIVE   Bilirubin Urine NEGATIVE  NEGATIVE   Ketones, ur NEGATIVE  NEGATIVE mg/dL   Protein, ur 161 (*) NEGATIVE mg/dL   Urobilinogen, UA 0.2  0.0 - 1.0 mg/dL   Nitrite NEGATIVE  NEGATIVE   Leukocytes, UA LARGE (*) NEGATIVE  URINE MICROSCOPIC-ADD ON      Component Value Range   Squamous Epithelial / LPF FEW (*) RARE   WBC, UA TOO NUMEROUS TO COUNT  <3 WBC/hpf   RBC / HPF 11-20  <3 RBC/hpf   Bacteria, UA FEW (*) RARE   Urine-Other MANY YEAST    GLUCOSE, CAPILLARY      Component Value Range   Glucose-Capillary 208 (*) 70 - 99 mg/dL      Radiological  Exams on Admission: Dg Chest 2 View  06/02/2012  *RADIOLOGY REPORT*  Clinical Data: This.  Hyperglycemia.  Fever.  CHEST - 2 VIEW  Comparison: 04/30/2012  Findings: The cardiac silhouette is mild to moderately enlarged, but stable.  No mediastinal or hilar masses or adenopathy.  The lungs are clear.  Elevation of the right hemidiaphragm, mild, is stable.  The bony thorax is diffusely demineralized, but intact.  IMPRESSION: No acute cardiopulmonary disease.  Original Report Authenticated By: Domenic Moras, M.D.      Orders placed during the hospital encounter of 06/02/12  . EKG 12-LEAD  . EKG 12-LEAD   EKG and strip showed sig Asxatic pause of  Assessment/Plan Active Problems:  Hypertension  Chronic kidney disease, stage III (moderate)  UTI (lower urinary tract infection)  Metabolic encephalopathy  Diabetes mellitus type 2, uncontrolled  1. Confusion/AMS/Lethargy due to recurrent UTI - She's post renal transplant has a Foley in place from recent bladder surgery - Dr Mena Goes wanted her to keep a foley for another 1-2 months.  Will admit, IVF and IV Abx.  Uro/Dr Mena Goes to consult and change out catheter.  Azteonam and Vanco ordered and had been on Cipro, Cefdinir and Rocephin.  Blood and Urine Cxs obtained.  Watch MS with her underlying Narcotics.  Currently pain controlled and MS fine so will leave home meds alone and follow.  2. Diabetes mellitus type 2, uncontrolled- Sugars elevated with current illness.Continue Levemir and SSI.  3. Hypertension- continue home bp medications.  4. Chronic kidney disease, stage III (moderate) in pt with H/O Kidney transplant - continue anti-rejection drugs. Cr stable.  Hydrate.  Follow labs. Immunosuppressants per Renal.  Renal was called per ED docs. 5. Metabolic Encephalopathy- resolving 6. Asymptomatic Cardiac Pause - Repeat EKG NSR Artifact.  There was @ 2-3 sec pause on Tele strip.  Admit to tele for eval.  May need cards consult 7 PT/OT/CW for  Adult FTT 8 DVT Proph ordered.  Iris Tatsch M 06/02/2012, 6:29 PM

## 2012-06-02 NOTE — ED Notes (Signed)
Notified RN of CBG [id number] 

## 2012-06-02 NOTE — Progress Notes (Signed)
ANTIBIOTIC CONSULT NOTE - INITIAL  Pharmacy Consult for vancomycin and aztreonam Indication: UTI in pt with indwelling foley catheter  Allergies  Allergen Reactions  . Adhesive (Tape)   . Aspirin     Crawling feeling under skin  . Hydrocodone Other (See Comments)    Hallucinations   . Iohexol      Desc: pt unable to tell what the reaction was   . Oxycodone   . Penicillins Hives and Swelling    Has tolerated cefazolin and ceftriaxone.  . Tacrolimus Other (See Comments)    Hallucinations   . Contrast Media (Iodinated Diagnostic Agents) Hives, Swelling and Rash    Patient Measurements: Height: 5' 2.99" (160 cm) Weight: 198 lb 10.2 oz (90.1 kg) IBW/kg (Calculated) : 52.38    Vital Signs: Temp: 99.4 F (37.4 C) (08/13 1117) Temp src: Oral (08/13 1117) BP: 170/72 mmHg (08/13 1500) Pulse Rate: 59  (08/13 1500) Intake/Output from previous day:   Intake/Output from this shift:    Labs:  Basename 06/02/12 1141  WBC 9.0  HGB 12.0  PLT 157  LABCREA --  CREATININE 1.71*   Estimated Creatinine Clearance: 31.2 ml/min (by C-G formula based on Cr of 1.71). No results found for this basename: VANCOTROUGH:2,VANCOPEAK:2,VANCORANDOM:2,GENTTROUGH:2,GENTPEAK:2,GENTRANDOM:2,TOBRATROUGH:2,TOBRAPEAK:2,TOBRARND:2,AMIKACINPEAK:2,AMIKACINTROU:2,AMIKACIN:2, in the last 72 hours   Microbiology: No results found for this or any previous visit (from the past 720 hour(s)).  Medical History: Past Medical History  Diagnosis Date  . Diabetes mellitus   . Diabetic neuropathy   . Hypothyroidism   . Hypertension   . GERD (gastroesophageal reflux disease)   . Heart murmur     not treated for  . Arthritis   . Chronic kidney disease     RENAL TRANSPLANT IN 2008--PT IS FOLLOWED BY DR. Karie Fetch WITH BUN OF 37 AND CREAT 1.7--PER NOTE DR. NESI FROM 04/11/12  . Urethral diverticulum   . Eyesight diminished     GLAUCOMA   . History of shingles     LEFT EYE JAN 2012--STILL HAS EYE  PAIN-STATES NOT ABLE TO TAKE THE MEDICATION FOR SHINGLES BECAUSE OF HER HX OF KIDNEY TRANSPLANT  . Fingernail abnormalities     FUNGUS OF FINGERNAILS    Medications: see medication history  Assessment: Mrs. Brunelle is a 71 yo lady presenting with altered mental status, UTI and hyperglycemia.  She has an indwelling foley catheter after a bladder surgery.  She had a kidney transplant in 2008 and has CKD stage 3.  Her creat is 1.7 (baseline per MD notes) and creat clearance ~ 31 ml/min.  Her temp was 99.4, WBC 9, with relative neutrophils elevated at 83%. Goal of Therapy:  Vancomycin trough level 10-15 mcg/ml  Plan:  1. Vancomycin loading dose of 1000 mg then vancomycin 750 mg IV q24 hours 2. Aztreonam 1000 mg IV q8h. 3. F/u renal function and culture data. Herby Abraham, Pharm.D. [phone] 06/02/2012 4:25 PM   Len Childs T 06/02/2012,4:18 PM

## 2012-06-02 NOTE — ED Provider Notes (Addendum)
History     CSN: [id number]  Arrival date & time 06/02/12  1058   First MD Initiated Contact with Patient 06/02/12 1159      Chief Complaint  Patient presents with  . Altered Mental Status  . Hyperglycemia  . Urinary Tract Infection   Level 5 caveat due to altered mental status. (Consider location/radiation/quality/duration/timing/severity/associated sxs/prior treatment) Patient is a 71 y.o. female presenting with altered mental status and urinary tract infection. The history is provided by the patient.  Altered Mental Status This is a new problem. Pertinent negatives include no chest pain and no headaches.  Urinary Tract Infection Pertinent negatives include no chest pain and no headaches.   patient had altered mental status her last few days. Sugars have been more difficult to control when up and going it. Family thinks that she may have a urinary tract infection. She has an indwelling Foley catheter after a bladder diverticulum surgery. No fevers. No cough. She is a kidney transplant patient.  Past Medical History  Diagnosis Date  . Diabetes mellitus   . Diabetic neuropathy   . Hypothyroidism   . Hypertension   . GERD (gastroesophageal reflux disease)   . Heart murmur     not treated for  . Arthritis   . Chronic kidney disease     RENAL TRANSPLANT IN 2008--PT IS FOLLOWED BY DR. Karie Fetch WITH BUN OF 37 AND CREAT 1.7--PER NOTE DR. NESI FROM 04/11/12  . Urethral diverticulum   . Eyesight diminished     GLAUCOMA   . History of shingles     LEFT EYE JAN 2012--STILL HAS EYE PAIN-STATES NOT ABLE TO TAKE THE MEDICATION FOR SHINGLES BECAUSE OF HER HX OF KIDNEY TRANSPLANT  . Fingernail abnormalities     FUNGUS OF FINGERNAILS    Past Surgical History  Procedure Date  . Kidney transplant 2008    At [facility]  . Back surgery     ruptured disk  . Abdominal hysterectomy   . Appendectomy   . Laser surgery of both eyes for hemorrhages   . 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 Urethral diverticulectomy 04/28/2012    Procedure: URETHRAL DIVERTICULECTOMY;  Surgeon: Antony Haste, MD;  Location: [facility];  Service: Urology;  Laterality: N/A;    Family History  Problem Relation Age of Onset  . Anesthesia problems Neg Hx     History  Substance Use Topics  . Smoking status: Former Smoker -- 0.5 packs/day for 20 years    Quit date: 10/21/1978  . Smokeless tobacco: Never Used  . Alcohol Use: No    OB History    Grav Para Term Preterm Abortions TAB SAB Ect Mult Living                  Review of Systems  Unable to perform ROS Constitutional: Negative for fever and chills.  Respiratory: Negative for choking.   Cardiovascular: Negative for chest pain.  Genitourinary: Negative for flank pain.  Musculoskeletal: Negative for back pain.  Neurological: Negative for headaches.  Psychiatric/Behavioral: Positive for altered mental status.    Allergies  Adhesive; Aspirin; Hydrocodone; Iohexol; Oxycodone; Penicillins; Tacrolimus; and Contrast media  Home Medications   Current Outpatient Rx  Name Route Sig Dispense Refill  . ALLOPURINOL 300 MG PO TABS Oral Take 150 mg by mouth daily with breakfast.     . AMLODIPINE BESYLATE 10 MG PO TABS Oral Take 10 mg by mouth daily with breakfast.     . CALCITRIOL  0.5 MCG PO CAPS Oral Take 0.5 mcg by mouth daily with breakfast.     . CALCIUM CARBONATE ANTACID 500 MG PO CHEW Oral Chew 2 tablets by mouth 3 (three) times daily with meals.    . FENTANYL 12 MCG/HR TD PT72 Transdermal Place 1 patch onto the skin every 3 (three) days.    . FENTANYL 50 MCG/HR TD PT72 Transdermal Place 1 patch onto the skin every 3 (three) days.    . FUROSEMIDE 40 MG PO TABS Oral Take 40 mg by mouth 2 (two) times daily.    Marland Kitchen GABAPENTIN 400 MG PO CAPS Oral Take 400 mg by mouth 3 (three) times daily.    . INSULIN DETEMIR 100 UNIT/ML [place] SOLN Subcutaneous Inject 28 Units into the skin daily with breakfast.     . INSULIN LISPRO (HUMAN)  100 UNIT/ML [place] SOLN Subcutaneous Inject 3-12 Units into the skin 3 (three) times daily before meals. Pt is on a sliding scale    . KETOCONAZOLE 2 % EX CREA Topical Apply 1 application topically 2 (two) times daily.    Marland Kitchen LABETALOL HCL 200 MG PO TABS Oral Take 300 mg by mouth 3 (three) times daily.     Marland Kitchen LATANOPROST 0.005 % OP SOLN Right Eye Place 1 drop into the right eye at bedtime.    Marland Kitchen LEVOTHYROXINE SODIUM 112 MCG PO TABS Oral Take 224 mcg by mouth daily with breakfast.    . MAGNESIUM OXIDE 400 MG PO TABS Oral Take 400 mg by mouth daily.     Marland Kitchen MYCOPHENOLATE MOFETIL 250 MG PO CAPS Oral Take 500 mg by mouth 2 (two) times daily.    Marland Kitchen PREDNISONE 5 MG PO TABS Oral Take 10 mg by mouth daily with breakfast.     . RANITIDINE HCL 150 MG PO TABS Oral Take 150 mg by mouth daily.    Marland Kitchen SIROLIMUS 1 MG PO TABS Oral Take 3 mg by mouth daily. Pt takes three 1 mg tablets = 3 mg daily    . VITAMIN D (ERGOCALCIFEROL) 50000 UNITS PO CAPS Oral Take 50,000 Units by mouth every 7 (seven) days. On Wednesdays      BP 170/72  Pulse 59  Temp 99.4 F (37.4 C) (Oral)  Resp 28  SpO2 100%  Physical Exam  Constitutional: She appears well-developed and well-nourished.  Eyes: Pupils are equal, round, and reactive to light.  Cardiovascular: Normal rate.   Pulmonary/Chest: Effort normal and breath sounds normal.  Abdominal: She exhibits distension.  Genitourinary:       Foley catheter no CVA tenderness.  Musculoskeletal: Normal range of motion.  Neurological: She is alert.       Mild confusion  Skin: Skin is warm and dry.    ED Course  Procedures (including critical care time)  Labs Reviewed  CBC WITH DIFFERENTIAL - Abnormal; Notable for the following:    RDW 18.5 (*)     Neutrophils Relative 83 (*)     Lymphocytes Relative 11 (*)     All other components within normal limits  BASIC METABOLIC PANEL - Abnormal; Notable for the following:    Potassium 3.3 (*)     CO2 38 (*)     Glucose, Bld 222 (*)     BUN  30 (*)     Creatinine, Ser 1.71 (*)     GFR calc non Af Amer 28 (*)     GFR calc Af Amer 33 (*)     All other components within  normal limits  URINALYSIS, ROUTINE W REFLEX MICROSCOPIC - Abnormal; Notable for the following:    APPearance CLOUDY (*)     Glucose, UA 100 (*)     Hgb urine dipstick LARGE (*)     Protein, ur 100 (*)     Leukocytes, UA LARGE (*)     All other components within normal limits  URINE MICROSCOPIC-ADD ON - Abnormal; Notable for the following:    Squamous Epithelial / LPF FEW (*)     Bacteria, UA FEW (*)     All other components within normal limits  GLUCOSE, CAPILLARY - Abnormal; Notable for the following:    Glucose-Capillary 208 (*)     All other components within normal limits  URINE CULTURE   Dg Chest 2 View  06/02/2012  *RADIOLOGY REPORT*  Clinical Data: This.  Hyperglycemia.  Fever.  CHEST - 2 VIEW  Comparison: 04/30/2012  Findings: The cardiac silhouette is mild to moderately enlarged, but stable.  No mediastinal or hilar masses or adenopathy.  The lungs are clear.  Elevation of the right hemidiaphragm, mild, is stable.  The bony thorax is diffusely demineralized, but intact.  IMPRESSION: No acute cardiopulmonary disease.  Original Report Authenticated By: Domenic Moras, M.D.     1. UTI (urinary tract infection)   2. Altered mental status   3. Chronic kidney disease, stage III (moderate)      Date: 06/29/2012  Rate: 73  Rhythm: normal sinus rhythm  QRS Axis: normal  Intervals: normal  ST/T Wave abnormalities: normal  Conduction Disutrbances:left bundle branch block  Narrative Interpretation: baseline artifact in multiple leads  Old EKG Reviewed: none available    MDM  Patient presents with altered mental status and lethargy the last few days. It is similar to previous UTIs that she's had. She's post renal transplant has a Foley in place from recent bladder surgery. Her sugars have also been somewhat elevated at home. Lab work shows possible  urinary tract infection. Creatinine is at her baseline. After discussion with nephrology the patient be admitted to the medicine service. I discussed with Dr. Timothy Lasso who will see the patient in ER.   Juliet Rude. Rubin Payor, MD 06/02/12 1603  Juliet Rude. Rubin Payor, MD 06/29/12 2055

## 2012-06-02 NOTE — ED Notes (Signed)
Patient was noted to have had an episode of pause in the monitor but the patient was asymptomatic. Will monitor.

## 2012-06-02 NOTE — ED Notes (Signed)
Pt here with AMS and lethargy x 3 days; pt family thinks she has may have UTI; pt with catheter from bladder sx; pt has had elevated blood sugar

## 2012-06-03 ENCOUNTER — Encounter (HOSPITAL_COMMUNITY): Payer: Self-pay | Admitting: General Practice

## 2012-06-03 LAB — CBC
HCT: 37 % (ref 36.0–46.0)
MCV: 86 fL (ref 78.0–100.0)
Platelets: 156 10*3/uL (ref 150–400)
RBC: 4.3 MIL/uL (ref 3.87–5.11)
RDW: 18.3 % — ABNORMAL HIGH (ref 11.5–15.5)
WBC: 4.8 10*3/uL (ref 4.0–10.5)

## 2012-06-03 LAB — BASIC METABOLIC PANEL
CO2: 40 mEq/L (ref 19–32)
Chloride: 98 mEq/L (ref 96–112)
Creatinine, Ser: 1.69 mg/dL — ABNORMAL HIGH (ref 0.50–1.10)
GFR calc Af Amer: 34 mL/min — ABNORMAL LOW (ref >90)
Sodium: 146 mEq/L — ABNORMAL HIGH (ref 135–145)

## 2012-06-03 LAB — GLUCOSE, CAPILLARY: Glucose-Capillary: 111 mg/dL — ABNORMAL HIGH (ref 70–99)

## 2012-06-03 MED ORDER — INSULIN ASPART 100 UNIT/ML ~~LOC~~ SOLN
0.0000 [IU] | Freq: Every day | SUBCUTANEOUS | Status: DC
  Administered 2012-06-03: 2 [IU] via SUBCUTANEOUS
  Administered 2012-06-04: 3 [IU] via SUBCUTANEOUS

## 2012-06-03 MED ORDER — INSULIN ASPART 100 UNIT/ML ~~LOC~~ SOLN
2.0000 [IU] | Freq: Three times a day (TID) | SUBCUTANEOUS | Status: DC
  Administered 2012-06-04 (×2): 2 [IU] via SUBCUTANEOUS
  Administered 2012-06-05: 6 [IU] via SUBCUTANEOUS

## 2012-06-03 MED ORDER — SODIUM CHLORIDE 0.9 % IV SOLN
INTRAVENOUS | Status: DC
  Administered 2012-06-03 – 2012-06-05 (×3): via INTRAVENOUS

## 2012-06-03 MED ORDER — POTASSIUM CHLORIDE CRYS ER 20 MEQ PO TBCR
30.0000 meq | EXTENDED_RELEASE_TABLET | Freq: Once | ORAL | Status: AC
  Administered 2012-06-03: 30 meq via ORAL
  Filled 2012-06-03: qty 1

## 2012-06-03 NOTE — Progress Notes (Signed)
CRITICAL VALUE ALERT  Critical value received:  CO2  Date of notification:  06/03/12  Time of notification:  0715  Critical value read back:yes  Nurse who received alert:  R.Ziad Maye RN  MD notified (1st page):  Dr. Evlyn Kanner   Time of first page:  0740  MD notified (2nd page):  Time of second page:  Responding MD:    Time MD responded:

## 2012-06-03 NOTE — Progress Notes (Signed)
Physical Therapy Evaluation Patient Details Name: [name] MRN: [id number] DOB: [birth year] Today's Date: 06/03/2012 Time: 4098-1191 PT Time Calculation (min): 31 min  PT Assessment / Plan / Recommendation Clinical Impression  71 yo female admitted with AMS, complex, recurrent UTI presents with decr functional mobility; Will benefit from PT to maximize independence and safety with mobility/transfers/amb to enable safe dc home    PT Assessment  Patient needs continued PT services    Follow Up Recommendations  Home health PT;Supervision/Assistance - 24 hour    Barriers to Discharge        Equipment Recommendations  None recommended by PT    Recommendations for Other Services     Frequency Min 3X/week    Precautions / Restrictions Precautions Precautions: Fall   Pertinent Vitals/Pain 0/10 without moving; 7/10 post amb to/from bathroom; Repositioned      Mobility  Bed Mobility Bed Mobility: Rolling Right;Right Sidelying to Sit;Sit to Supine;Sitting - Scoot to Edge of Bed Rolling Right: 4: Min guard;With rail Right Sidelying to Sit: 4: Min guard;With rails Sitting - Scoot to Edge of Bed: 5: Supervision Sit to Supine: 4: Min guard;With rail Details for Bed Mobility Assistance: Verbal cues for gross log roll technique to hopefully minimize back pain with getting up; pt got back supine without physical assist, but with inefficient movement, and did not perform sit to side lie Transfers Transfers: Sit to Stand;Stand to Sit Sit to Stand: 4: Min assist;With upper extremity assist;From bed;From chair/3-in-1 Stand to Sit: 4: Min guard;With upper extremity assist;To chair/3-in-1 Details for Transfer Assistance: Cues fro safety and hand placement; Noted pt tending to pull up on RW, and sits back to bed with hands on RW Ambulation/Gait Ambulation/Gait Assistance: 4: Min guard (with and without physical contact) Ambulation Distance (Feet): 20 Feet (to/from bathroom) Assistive device:  Rolling walker Ambulation/Gait Assistance Details: Cues for posture, safety , and RW proximity Gait Pattern: Decreased stride length    Exercises     PT Diagnosis: Difficulty walking;Generalized weakness  PT Problem List: Decreased strength;Decreased activity tolerance;Decreased balance;Decreased mobility;Decreased knowledge of use of DME;Pain PT Treatment Interventions: DME instruction;Gait training;Stair training;Functional mobility training;Therapeutic activities;Therapeutic exercise;Patient/family education   PT Goals Acute Rehab PT Goals PT Goal Formulation: With patient/family Time For Goal Achievement: 06/17/12 Potential to Achieve Goals: Good Pt will go Supine/Side to Sit: with modified independence PT Goal: Supine/Side to Sit - Progress: Goal set today Pt will go Sit to Supine/Side: with modified independence PT Goal: Sit to Supine/Side - Progress: Goal set today Pt will go Sit to Stand: with modified independence PT Goal: Sit to Stand - Progress: Goal set today Pt will go Stand to Sit: with modified independence PT Goal: Stand to Sit - Progress: Goal set today Pt will Ambulate: >150 feet;with modified independence;with least restrictive assistive device PT Goal: Ambulate - Progress: Goal set today Pt will Go Up / Down Stairs: 1-2 stairs;with supervision;with least restrictive assistive device PT Goal: Up/Down Stairs - Progress: Goal set today Additional Goals Additional Goal #1: Pt will utilize back precautions with mobility to hopefully mitigate back pain PT Goal: Additional Goal #1 - Progress: Goal set today  Visit Information  Last PT Received On: 06/03/12 Assistance Needed: +1 PT/OT Co-Evaluation/Treatment: Yes    Subjective Data  Subjective: Reports is interested in getting some extra help in the home Patient Stated Goal: get better; be able to get rid of foley   Prior Functioning  Home Living Lives With: Spouse Available Help at Discharge: Family;Available  24 hours/day Type of Home: House Home Access: Stairs to enter [employer] of Steps: 1 Entrance Stairs-Rails: None Home Layout: One level Bathroom Shower/Tub: Health visitor: Handicapped height Bathroom Accessibility: Yes How Accessible: Accessible via walker Home Adaptive Equipment: Built-in shower seat;Walker - four wheeled;Grab bars in shower Printmaker for community access/in stores) Prior Function Level of Independence: Needs assistance Needs Assistance: Bathing;Dressing;Light Housekeeping;Meal Prep (Reports incr time and decr endurance) Bath: Moderate Dressing: Moderate Meal Prep: Total Light Housekeeping: Total Able to Take Stairs?: Yes (one step with assist) Driving: No Communication Communication: No difficulties    Cognition  Overall Cognitive Status: Appears within functional limits for tasks assessed/performed Arousal/Alertness: Awake/alert Orientation Level: Appears intact for tasks assessed Behavior During Session: [facility] for tasks performed    Extremity/Trunk Assessment Right Upper Extremity Assessment RUE ROM/Strength/Tone: [facility] for tasks assessed Left Upper Extremity Assessment LUE ROM/Strength/Tone: WFL for tasks assessed Right Lower Extremity Assessment RLE ROM/Strength/Tone: Deficits RLE ROM/Strength/Tone Deficits: Generalized weakness, with dependence on UE push for successful sit to stand Left Lower Extremity Assessment LLE ROM/Strength/Tone: Deficits LLE ROM/Strength/Tone Deficits: Generalized weakness, with dependence on UE push for successful sit to stand Trunk Assessment Trunk Assessment: Other exceptions Trunk Exceptions: Reports periodic back pain, disc problems   Balance    End of Session PT - End of Session Activity Tolerance: Patient tolerated treatment well Patient left: in bed;with call bell/phone within reach;with family/visitor present Nurse Communication: Mobility status (Pt's desire for an icee (and to have her CBG  checked))  GP     Olen Pel [place], [place] [phone]   06/03/2012, 11:52 AM

## 2012-06-03 NOTE — Evaluation (Signed)
Occupational Therapy Evaluation Patient Details Name: [name] MRN: [id number] DOB: 1940/12/02 Today's Date: 06/03/2012 Time:  -     OT Assessment / Plan / Recommendation Clinical Impression  Pt demos decline in function with strength, balance, safety and activity tolerance. Pt would benefit from skilled OT services to address these impairments to restore PLOF to return home safely    OT Assessment  Patient needs continued OT Services    Follow Up Recommendations  Home health OT    Barriers to Discharge None Husband provides care and A at home  Equipment Recommendations  None recommended by OT    Recommendations for Other Services    Frequency  Min 2X/week    Precautions / Restrictions Precautions Precautions: Fall Restrictions Weight Bearing Restrictions: No       ADL  Grooming: Performed;Wash/dry hands;Wash/dry face;Min guard;Other (comment) (verabl cues for safety using RW) Where Assessed - Grooming: Supported standing;Other (comment) (at sink ) Upper Body Bathing: Simulated;Moderate assistance;Maximal assistance Where Assessed - Upper Body Bathing: Unsupported sitting Lower Body Bathing: Simulated;Maximal assistance;Moderate assistance Where Assessed - Lower Body Bathing: Unsupported sitting Upper Body Dressing: Performed;Minimal assistance;Moderate assistance Where Assessed - Upper Body Dressing: Unsupported sitting Lower Body Dressing: Performed;Minimal assistance;Other (comment) (donning socks) Where Assessed - Lower Body Dressing: Unsupported sitting Toilet Transfer: Minimal assistance;Other (comment) (verbal cues for safety using RW, 3 in 1 and grab bars) Toilet Transfer Method: Sit to stand;Other (comment) (ambulating from RW level) Toilet Transfer Equipment: Raised toilet seat with arms (or 3-in-1 over toilet);Grab bars Toileting - Clothing Manipulation and Hygiene: Minimal assistance Where Assessed - Glass blower/designer Manipulation and Hygiene:  Standing Equipment Used: Gait belt;Rolling walker;Other (comment) (3 in 1) Transfers/Ambulation Related to ADLs: Pt required mod verbal cues for safety using RW, correct hand placement and body position    OT Diagnosis: Generalized weakness;Blindness and low vision  OT Problem List: Decreased strength;Decreased range of motion;Decreased activity tolerance;Decreased safety awareness;Decreased knowledge of use of DME or AE;Impaired vision/perception;Decreased knowledge of precautions;Pain OT Treatment Interventions: Self-care/ADL training;Therapeutic activities;Therapeutic exercise;Neuromuscular education;Energy conservation;DME and/or AE instruction;Patient/family education;Balance training   OT Goals Acute Rehab OT Goals OT Goal Formulation: With patient/family Time For Goal Achievement: 06/10/12 Potential to Achieve Goals: Good ADL Goals Pt Will Perform Grooming: with supervision;with set-up;Standing at sink ADL Goal: Grooming - Progress: Goal set today Pt Will Perform Upper Body Bathing: with min assist;with mod assist;Sitting, edge of bed;Sitting at sink ADL Goal: Upper Body Bathing - Progress: Goal set today Pt Will Perform Lower Body Bathing: with min assist;with mod assist;Sitting, chair;Sitting, edge of bed;Sitting at sink ADL Goal: Lower Body Bathing - Progress: Goal set today Pt Will Perform Upper Body Dressing: with set-up;with supervision;Sitting, chair;Sitting, bed ADL Goal: Upper Body Dressing - Progress: Goal set today Pt Will Perform Lower Body Dressing: with min assist;with mod assist;Sitting, chair;Sitting, bed;Sit to stand from chair ADL Goal: Lower Body Dressing - Progress: Goal set today Pt Will Transfer to Toilet: with supervision;with DME;Grab bars ADL Goal: Toilet Transfer - Progress: Goal set today  Visit Information  Last OT Received On: 06/03/12 Assistance Needed: +1    Subjective Data  Subjective: " I would like to have some help at home to give my husband a  break " Patient Stated Goal: " To return home "   Prior Functioning  Vision/Perception  Home Living Lives With: Spouse Available Help at Discharge: Family;Available 24 hours/day Type of Home: House Home Access: Stairs to enter [employer] of Steps: 1 Entrance Stairs-Rails: None Home Layout:  One level Bathroom Shower/Tub: Health visitor: Handicapped height Bathroom Accessibility: Yes How Accessible: Accessible via walker Home Adaptive Equipment: Built-in shower seat;Walker - four wheeled;Grab bars in shower [employer]maker for community access/in stores) Prior Function Level of Independence: Needs assistance Needs Assistance: Bathing;Dressing;Light Housekeeping;Meal Prep (Reports incr time and decr endurance) Bath: Moderate Dressing: Moderate Meal Prep: Total Light Housekeeping: Total Able to Take Stairs?: Yes (one step with assist) Driving: No Vocation: Retired Musician: No difficulties Dominant Hand: Right      Cognition  Overall Cognitive Status: Appears within functional limits for tasks assessed/performed Arousal/Alertness: Awake/alert Orientation Level: Appears intact for tasks assessed Behavior During Session: [facility] for tasks performed    Extremity/Trunk Assessment Right Upper Extremity Assessment RUE ROM/Strength/Tone: [facility] for tasks assessed (R shoulder impaired approx 75 - 80 degrees) RUE Sensation: WFL - Light Touch RUE Coordination: WFL - gross/fine motor Left Upper Extremity Assessment LUE ROM/Strength/Tone: WFL for tasks assessed (L shoulder approx 90 degrees) LUE Sensation: WFL - Light Touch LUE Coordination: WFL - gross/fine motor Right Lower Extremity Assessment RLE ROM/Strength/Tone: Deficits RLE ROM/Strength/Tone Deficits: Generalized weakness, with dependence on UE push for successful sit to stand Left Lower Extremity Assessment LLE ROM/Strength/Tone: Deficits LLE ROM/Strength/Tone Deficits: Generalized  weakness, with dependence on UE push for successful sit to stand Trunk Assessment Trunk Assessment: Other exceptions Trunk Exceptions: Reports periodic back pain, disc problems   Mobility Bed Mobility Bed Mobility: Rolling Right;Right Sidelying to Sit;Sit to Supine;Sitting - Scoot to Edge of Bed Rolling Right: 4: Min guard;With rail Right Sidelying to Sit: 4: Min guard;With rails Sitting - Scoot to Edge of Bed: 5: Supervision Sit to Supine: 4: Min guard;With rail Details for Bed Mobility Assistance: Verbal cues for gross log roll technique to hopefully minimize back pain with getting up; pt got back supine without physical assist, but with inefficient movement, and did not perform sit to side lie Transfers Transfers: Sit to Stand;Stand to Sit Sit to Stand: 4: Min assist;With upper extremity assist;From bed;From chair/3-in-1 Stand to Sit: 4: Min guard;With upper extremity assist;To chair/3-in-1 Details for Transfer Assistance: Cues fro safety and hand placement; Noted pt tending to pull up on RW, and sits back to bed with hands on RW           End of Session OT - End of Session Equipment Utilized During Treatment: Gait belt;Other (comment) (RW, 3 in 1) Activity Tolerance: Patient tolerated treatment well Patient left: in bed;with call bell/phone within reach;with family/visitor present  GO     Galen Manila 06/03/2012, 12:47 PM

## 2012-06-03 NOTE — Progress Notes (Signed)
Pt had run of bigeminy. Pt asymptomatic. Will continue to monitor.

## 2012-06-03 NOTE — Plan of Care (Signed)
Problem: Phase II Progression Outcomes Goal: Discharge plan established Recommend HH OT eval for ADL safety after acute d/c

## 2012-06-03 NOTE — Progress Notes (Addendum)
Subjective: Patient presented to ED last night with lethargy and AMS.  She was admitted for treatment of UTI.  She has foley in place s/p urethral diverticulectomy 04/28/12 by Dr. Mena Goes.  Pt was started on broad spectrum IV Abx and this morning is feeling much better.  She is awake and A/O.  She feels well and is without complaint.  HX: Hx:  History of Present Illness: 72 YOF with history of recurrent UTI, urosepsis previously maintained on prophylactic nitrofurantoin. 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 WBC 9,000, abnormal u/a. Foley still in       place.    Objective: Vital signs in last 24 hours: Temp:  [97.5 F (36.4 C)-99.4 F (37.4 C)] 98.4 F (36.9 C) (08/14 0941) Pulse Rate:  [51-78] 78  (08/14 0941) Resp:  [11-29] 18  (08/14 0941) BP: (129-181)/(44-114) 151/44 mmHg (08/14 0941) SpO2:  [90 %-100 %] 94 % (08/14 0941) Weight:  [90.1 kg (198 lb 10.2 oz)-90.357 kg (199 lb 3.2 oz)] 90.357 kg (199 lb 3.2 oz) (08/13 2210)  Intake/Output from previous day: 08/13 0701 - 08/14 0700 In: 480 [P.O.:480] Out: 2050 [Urine:2050] Intake/Output this shift: Total I/O In: 240 [P.O.:240] Out: -   Physical Exam:  General:alert, cooperative and no distress GI: soft, non tender, normal bowel sounds, no palpable masses Foley: clear/yellow urine    Lab Results:  Basename 06/03/12 0605 06/02/12 1141  HGB 11.2* 12.0  HCT 37.0 39.9   BMET  Basename 06/03/12 0605 06/02/12 1141  NA 146* 144  K 3.3* 3.3*  CL 98 98  CO2 40* 38*  GLUCOSE 94 222*  BUN 30* 30*  CREATININE 1.69* 1.71*  CALCIUM 9.2 9.1   No results found for this basename: LABPT:3,INR:3 in the last 72 hours No results found for this basename: LABURIN:1 in the last 72  hours Results for orders placed during the hospital encounter of 04/30/12  CULTURE, BLOOD (ROUTINE X 2)     Status: Normal   Collection Time   04/30/12  1:25 PM      Component Value Range Status Comment   Specimen Description BLOOD RIGHT HAND   Final    Special Requests BOTTLES DRAWN AEROBIC AND ANAEROBIC   Final    Culture  Setup Time 04/30/2012 20:01   Final    Culture NO GROWTH 5 DAYS   Final    Report Status 05/06/2012 FINAL   Final   URINE CULTURE     Status: Normal   Collection Time   04/30/12  1:27 PM      Component Value Range Status Comment   Specimen Description URINE, CATHETERIZED   Final    Special Requests NONE   Final    Culture  Setup Time 04/30/2012 20:21   Final    Colony Count NO GROWTH   Final    Culture NO GROWTH   Final    Report Status 05/01/2012 FINAL   Final   CULTURE, BLOOD (ROUTINE X 2)     Status: Normal   Collection Time   04/30/12  2:30 PM      Component Value Range Status Comment   Specimen Description BLOOD RIGHT ARM   Final    Special Requests BOTTLES  DRAWN AEROBIC AND ANAEROBIC 5CC   Final    Culture  Setup Time 04/30/2012 20:01   Final    Culture NO GROWTH 5 DAYS   Final    Report Status 05/06/2012 FINAL   Final     Studies/Results: Dg Chest 2 View  06/02/2012  *RADIOLOGY REPORT*  Clinical Data: This.  Hyperglycemia.  Fever.  CHEST - 2 VIEW  Comparison: 04/30/2012  Findings: The cardiac silhouette is mild to moderately enlarged, but stable.  No mediastinal or hilar masses or adenopathy.  The lungs are clear.  Elevation of the right hemidiaphragm, mild, is stable.  The bony thorax is diffusely demineralized, but intact.  IMPRESSION: No acute cardiopulmonary disease.  Original Report Authenticated By: Domenic Moras, M.D.    Assessment/Plan: Pt is improving with IV Abx.  F/U urine culture for directed ABx therapy.    Pt was evaled in the office by Dr. Mena Goes on 05/26/12 with [facility].  She still had filling of the space where her diverticulum was,  therefore, he stated the foley should stay in place for at least another month to allow the space to continue to close.  She is scheduled to f/u with Dr. Mena Goes 06/24/12 for repeat eval.  Antibiotic suppression can be determined when culture results are available for review.   LOS: 1 day   YARBROUGH,Alexcis Bicking G. 06/03/2012, 10:38 AM

## 2012-06-03 NOTE — Progress Notes (Signed)
Subjective: Admitted with AMS due to recurrent UTI. S/P Bladder surgery and foley in place. Seen by Dr Patsi Sears last night and he will follow in am. On Abx. Cr stable. Pause in ED, Bigeminy on floor.  Asxatic. Bicarb of 40 on labs noted. Husband stated she is mentally at baseline. She is jovial and doing well this am. T max 99.4  Objective: Vital signs in last 24 hours: Temp:  [97.5 F (36.4 C)-99.4 F (37.4 C)] 97.5 F (36.4 C) (08/14 0623) Pulse Rate:  [51-76] 68  (08/14 0623) Resp:  [11-29] 18  (08/14 0623) BP: (129-181)/(55-114) 149/62 mmHg (08/14 0623) SpO2:  [90 %-100 %] 98 % (08/14 0623) Weight:  [90.1 kg (198 lb 10.2 oz)-90.357 kg (199 lb 3.2 oz)] 90.357 kg (199 lb 3.2 oz) (08/13 2210) Weight change:     CBG (last 3)   Basename 06/03/12 0750 06/02/12 2155 06/02/12 1901  GLUCAP 111* 85 256*    Intake/Output from previous day:  Intake/Output Summary (Last 24 hours) at 06/03/12 0756 Last data filed at 06/03/12 1610  Gross per 24 hour  Intake    480 ml  Output   2050 ml  Net  -1570 ml   08/13 0701 - 08/14 0700 In: 480 [P.O.:480] Out: 2050 [Urine:2050]   Physical Exam  General appearance: A and Oriented.  Looks better than last night. Eyes: no scleral icterus Throat: oropharynx moist without erythema Resp: CTA Cardio: Reg c m GI: soft, non-tender; bowel sounds normal; no masses,  no organomegaly Extremities: no clubbing, cyanosis or edema Foley in Place   Lab Results:  Basename 06/03/12 0605 06/02/12 1141  NA 146* 144  K 3.3* 3.3*  CL 98 98  CO2 40* 38*  GLUCOSE 94 222*  BUN 30* 30*  CREATININE 1.69* 1.71*  CALCIUM 9.2 9.1  MG -- --  PHOS -- --    No results found for this basename: AST:2,ALT:2,ALKPHOS:2,BILITOT:2,PROT:2,ALBUMIN:2 in the last 72 hours   Basename 06/03/12 0605 06/02/12 1141  WBC 4.8 9.0  NEUTROABS -- 7.4  HGB 11.2* 12.0  HCT 37.0 39.9  MCV 86.0 87.9  PLT 156 157    Lab Results  Component Value Date   INR 1.0  02/19/2009   INR 0.9 04/02/2007    No results found for this basename: CKTOTAL:3,CKMB:3,CKMBINDEX:3,TROPONINI:3 in the last 72 hours  No results found for this basename: TSH,T4TOTAL,FREET3,T3FREE,THYROIDAB in the last 72 hours  No results found for this basename: VITAMINB12:2,FOLATE:2,FERRITIN:2,TIBC:2,IRON:2,RETICCTPCT:2 in the last 72 hours  Micro Results: No results found for this or any previous visit (from the past 240 hour(s)).   Studies/Results: Dg Chest 2 View  06/02/2012  *RADIOLOGY REPORT*  Clinical Data: This.  Hyperglycemia.  Fever.  CHEST - 2 VIEW  Comparison: 04/30/2012  Findings: The cardiac silhouette is mild to moderately enlarged, but stable.  No mediastinal or hilar masses or adenopathy.  The lungs are clear.  Elevation of the right hemidiaphragm, mild, is stable.  The bony thorax is diffusely demineralized, but intact.  IMPRESSION: No acute cardiopulmonary disease.  Original Report Authenticated By: Domenic Moras, M.D.     Medications: Scheduled:   . allopurinol  150 mg Oral Q breakfast  . amLODipine  10 mg Oral Q breakfast  . aztreonam  1 g Intravenous Q8H  . calcitRIOL  0.5 mcg Oral Q breakfast  . calcium carbonate  2 tablet Oral TID WC  . famotidine  20 mg Oral Daily  . fentaNYL  12.5 mcg Transdermal Q72H  . fentaNYL  50 mcg Transdermal Q72H  . furosemide  40 mg Oral BID  . gabapentin  400 mg Oral TID  . insulin aspart  0-15 Units Subcutaneous TID WC  . insulin aspart  0-5 Units Subcutaneous QHS  . insulin detemir  28 Units Subcutaneous Q breakfast  . labetalol  300 mg Oral TID  . latanoprost  1 drop Right Eye QHS  . levothyroxine  224 mcg Oral Q breakfast  . magnesium oxide  400 mg Oral Daily  . mycophenolate  500 mg Oral BID  . predniSONE  10 mg Oral Q breakfast  . sirolimus  3 mg Oral Daily  . vancomycin  750 mg Intravenous Q24H  . vancomycin  1,000 mg Intravenous Once  . Vitamin D (Ergocalciferol)  50,000 Units Oral Q7 days  . DISCONTD: magnesium  oxide  400 mg Oral Daily   Continuous:    Assessment/Plan: Active Problems:  Hypertension  Chronic kidney disease, stage III (moderate)  UTI (lower urinary tract infection)  Metabolic encephalopathy  Diabetes mellitus type 2, uncontrolled  1. 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 She is maintaining her Catheter and Uro is helping [place] figure out short and long term treatment plan. Her DM and immunosuppression is keeping her susceptible to recurrent UTIs. She had been on Nitrofurantoin. S/P 7/0 Bladder Diverticulum Surgery and has Foley in place - Dr Mena Goes. Continue IVF and IV Abx =  Azteonam and Vanco.  Gm stain GNR.  Adjust Abx based on Cx.  Recent Abx include Cipro, Cefdinir, Macrodantin, and Rocephin.  Watch MS with her underlying Narcotics. Currently pain controlled and MS fine so will leave home meds alone and follow.    2. Diabetes mellitus type 2, uncontrolled- Sugars elevated with current illness.Continue Levemir and SSI.  Decent CBGs overnight.  -  Basename 06/03/12 0750 06/02/12 2155 06/02/12 1901  GLUCAP 111* 85 256*   ID -  Anti-infectives     Start     Dose/Rate Route Frequency Ordered Stop   06/03/12 1800   vancomycin (VANCOCIN) 750 mg in sodium chloride 0.9 % 150 mL IVPB        750 mg 150 mL/hr over 60 Minutes Intravenous Every 24 hours 06/02/12 1616     06/02/12 1630   vancomycin (VANCOCIN) IVPB 1000 mg/200 mL premix        1,000 mg 200 mL/hr over 60 Minutes Intravenous  Once 06/02/12 1616 06/02/12 1828   06/02/12 1630   aztreonam (AZACTAM) 1 g in dextrose 5 % 50 mL IVPB        1 g 100 mL/hr over 30 Minutes Intravenous 3 times per day 06/02/12 1617           3. Hypertension- continue home bp medications.  4. Chronic kidney disease, stage III (moderate) in pt with H/O Kidney transplant - continue anti-rejection drugs. Cr stable. Gentle  hydrate. Follow labs. Immunosuppressants per Renal. Renal was called per ED docs.  5. Metabolic Encephalopathy- resolving  6. Asymptomatic Cardiac Pause and Bigeminy Stay on tele for eval. May need cards consult?? 7 PT/OT/CW for Adult FTT  8 DVT Proph ordered. 9 Bicarb 40 and K3.3 - K repleted.  Follow labs. 10 Cr 1.69.  Hbg 11.2 - no issues.    LOS: 1 day   [place] Lapaglia M 06/03/2012, 7:56 AM

## 2012-06-04 LAB — CBC
HCT: 37.1 % (ref 36.0–46.0)
MCH: 26.9 pg (ref 26.0–34.0)
MCV: 86.1 fL (ref 78.0–100.0)
RBC: 4.31 MIL/uL (ref 3.87–5.11)
WBC: 4.1 10*3/uL (ref 4.0–10.5)

## 2012-06-04 LAB — URINE CULTURE

## 2012-06-04 LAB — BASIC METABOLIC PANEL
BUN: 33 mg/dL — ABNORMAL HIGH (ref 6–23)
CO2: 34 mEq/L — ABNORMAL HIGH (ref 19–32)
Calcium: 9.3 mg/dL (ref 8.4–10.5)
Chloride: 96 mEq/L (ref 96–112)
Creatinine, Ser: 1.7 mg/dL — ABNORMAL HIGH (ref 0.50–1.10)

## 2012-06-04 LAB — GLUCOSE, CAPILLARY: Glucose-Capillary: 271 mg/dL — ABNORMAL HIGH (ref 70–99)

## 2012-06-04 MED ORDER — POTASSIUM CHLORIDE 20 MEQ PO PACK
40.0000 meq | PACK | Freq: Every day | ORAL | Status: DC
  Filled 2012-06-04: qty 2

## 2012-06-04 MED ORDER — POTASSIUM CHLORIDE CRYS ER 20 MEQ PO TBCR
40.0000 meq | EXTENDED_RELEASE_TABLET | Freq: Every day | ORAL | Status: DC
  Administered 2012-06-04 – 2012-06-05 (×2): 40 meq via ORAL
  Filled 2012-06-04 (×2): qty 2

## 2012-06-04 NOTE — Progress Notes (Deleted)
Physical Therapy Treatment Patient Details Name: [name] MRN: [id number] DOB: 10-31-1940 Today's Date: 06/04/2012 Time: 4098-1191 PT Time Calculation (min): 16 min  PT Assessment / Plan / Recommendation Comments on Treatment Session  Pt. with decreased mobility from her baseline but wants to do all she can do to increase her independence.  Pt's O2 sats mid 90's throughout on 3L O2.    Follow Up Recommendations  Skilled nursing facility    Barriers to Discharge        Equipment Recommendations  Defer to next venue    Recommendations for Other Services    Frequency Min 3X/week   Plan Discharge plan needs to be updated    Precautions / Restrictions Precautions Precautions: Fall Restrictions Weight Bearing Restrictions: No   Pertinent Vitals/Pain O2 sats on 3L O2 at rest and with in room walk remained in the mid 90's.  HR range     Mobility  Bed Mobility Bed Mobility: Not assessed Transfers Transfers: Sit to Stand;Stand to Sit Sit to Stand: 4: Min assist;With armrests;From chair/3-in-1 Stand to Sit: 4: Min guard;With upper extremity assist;To chair/3-in-1 Details for Transfer Assistance: cues for hand placement and safety Ambulation/Gait Ambulation/Gait Assistance: 4: Min guard Ambulation Distance (Feet): 20 Feet Assistive device: Rolling walker Ambulation/Gait Assistance Details: Pt. needed postural cues as she started out upright but became more flexed as walk progressed. Gait Pattern: Decreased stride length    Exercises General Exercises - Lower Extremity Ankle Circles/Pumps: AROM;Both;10 reps;Seated Long Arc Quad: AROM;Both;10 reps;Seated Hip Flexion/Marching: AROM;Both;10 reps;Seated   PT Diagnosis:    PT Problem List:   PT Treatment Interventions:     PT Goals Acute Rehab PT Goals PT Goal: Sit to Stand - Progress: Progressing toward goal PT Goal: Stand to Sit - Progress: Progressing toward goal PT Goal: Ambulate - Progress: Progressing toward  goal  Visit Information  Last PT Received On: 06/04/12 Assistance Needed: +1    Subjective Data  Subjective: Wish I could get rid of thei cought   Cognition  Overall Cognitive Status: Appears within functional limits for tasks assessed/performed Arousal/Alertness: Awake/alert Orientation Level: Appears intact for tasks assessed Behavior During Session: [facility] for tasks performed    Balance     End of Session PT - End of Session Equipment Utilized During Treatment: Gait belt;Oxygen Activity Tolerance: Patient tolerated treatment well Patient left: in chair;with call bell/phone within reach;with family/visitor present Nurse Communication: Mobility status   GP     Ferman Hamming 06/04/2012, 1:39 PM

## 2012-06-04 NOTE — Progress Notes (Signed)
Subjective: Doing well. No pain. Mentating better. Eating OK. NO significant fever.     Objective: Vital signs in last 24 hours: Temp:  [97.6 F (36.4 C)-98.8 F (37.1 C)] 98.8 F (37.1 C) (08/15 0900) Pulse Rate:  [69-78] 78  (08/15 0900) Resp:  [18] 18  (08/15 0900) BP: (131-152)/(50-76) 136/75 mmHg (08/15 0900) SpO2:  [94 %-98 %] 98 % (08/15 0900) Weight:  [69.491 kg (153 lb 3.2 oz)] 69.491 kg (153 lb 3.2 oz) (08/14 1952)  Intake/Output from previous day: 08/14 0701 - 08/15 0700 In: 1189 [P.O.:480; I.V.:309; IV Piggyback:400] Out: 1800 [Urine:1800] Intake/Output this shift: Total I/O In: 240 [P.O.:240] Out: 650 [Urine:650]  General: alert left eye less swollen. Neck supple. Lungs clear. Ht regular with sys murmur. abd soft NT. Awake, alert clear speech  Lab Results   [facility] 06/04/12 0630 06/03/12 0605  WBC 4.1 4.8  RBC 4.31 4.30  HGB 11.6* 11.2*  HCT 37.1 37.0  MCV 86.1 86.0  MCH 26.9 26.0  RDW 17.9* 18.3*  PLT 166 156    Basename 06/04/12 0630 06/03/12 0605  NA 140 146*  K 3.4* 3.3*  CL 96 98  CO2 34* 40*  GLUCOSE 176* 94  BUN 33* 30*  CREATININE 1.70* 1.69*  CALCIUM 9.3 9.2    Studies/Results: No results found.  Scheduled Meds:   . allopurinol  150 mg Oral Q breakfast  . amLODipine  10 mg Oral Q breakfast  . aztreonam  1 g Intravenous Q8H  . calcitRIOL  0.5 mcg Oral Q breakfast  . calcium carbonate  2 tablet Oral TID WC  . famotidine  20 mg Oral Daily  . fentaNYL  12.5 mcg Transdermal Q72H  . fentaNYL  50 mcg Transdermal Q72H  . furosemide  40 mg Oral BID  . gabapentin  400 mg Oral TID  . insulin aspart  0-5 Units Subcutaneous QHS  . insulin aspart  2-7 Units Subcutaneous TID WC  . insulin detemir  28 Units Subcutaneous Q breakfast  . labetalol  300 mg Oral TID  . latanoprost  1 drop Right Eye QHS  . levothyroxine  224 mcg Oral Q breakfast  . magnesium oxide  400 mg Oral Daily  . mycophenolate  500 mg Oral BID  . potassium chloride  40  mEq Oral Daily  . predniSONE  10 mg Oral Q breakfast  . sirolimus  3 mg Oral Daily  . vancomycin  750 mg Intravenous Q24H  . Vitamin D (Ergocalciferol)  50,000 Units Oral Q7 days  . DISCONTD: insulin aspart  0-15 Units Subcutaneous TID WC  . DISCONTD: insulin aspart  0-5 Units Subcutaneous QHS   Continuous Infusions:   . sodium chloride 20 mL/hr at 06/03/12 1033   PRN Meds:acetaminophen, acetaminophen, ketoconazole  Assessment/Plan: Patient Active Problem List  Diagnosis  . Hypokalemia: replace  . Hypertension: BP fine  . Chronic kidney disease, stage III (moderate): doing well  . UTI (lower urinary tract infection):   Marland Kitchen Metabolic encephalopathy: mentating better  . Diabetes mellitus type 2, uncontrolled: BS's OK  . Sepsis syndrome: improved      LOS: 2 days   [name] 06/04/2012, 12:43 PM

## 2012-06-04 NOTE — Progress Notes (Signed)
Physical Therapy Treatment Patient Details Name: [name] MRN: [id number] DOB: 06-26-1941 Today's Date: 06/04/2012 Time: 4098-1191 PT Time Calculation (min): 16 min  PT Assessment / Plan / Recommendation Comments on Treatment Session  Pt. Progressively mobilizing and tolerating well.  Follow Up Recommendations  Home health PT;Supervision/Assistance - 24 hour    Barriers to Discharge        Equipment Recommendations  None recommended by PT    Recommendations for Other Services    Frequency Min 3X/week   Plan Discharge plan remains appropriate    Precautions / Restrictions Precautions Precautions: Fall Restrictions Weight Bearing Restrictions: No   Pertinent Vitals/Pain No pain, no distress    Mobility  Bed Mobility Bed Mobility: Rolling Right;Right Sidelying to Sit Rolling Right: 4: Min guard;With rail Right Sidelying to Sit: 4: Min guard;With rails Sitting - Scoot to Edge of Bed: 5: Supervision Details for Bed Mobility Assistance: vc's for technique Transfers Transfers: Sit to Stand;Stand to Sit Sit to Stand: 4: Min assist;With upper extremity assist;From bed Stand to Sit: 4: Min guard;With upper extremity assist;To chair/3-in-1 Details for Transfer Assistance: cues for hand placement and safety Ambulation/Gait Ambulation/Gait Assistance: 4: Min guard Ambulation Distance (Feet): 20 Feet Assistive device: Rolling walker Ambulation/Gait Assistance Details: Pt. needed postural cues and min guard assist to assure balance. Gait Pattern: Decreased stride length    Exercises     PT Diagnosis:    PT Problem List:   PT Treatment Interventions:     PT Goals Acute Rehab PT Goals PT Goal: Supine/Side to Sit - Progress: Progressing toward goal PT Goal: Sit to Supine/Side - Progress: Progressing toward goal  Visit Information  Last PT Received On: 06/04/12 Assistance Needed: +1    Subjective Data      Cognition  Overall Cognitive Status: Appears within  functional limits for tasks assessed/performed Arousal/Alertness: Awake/alert Orientation Level: Appears intact for tasks assessed Behavior During Session: [facility] for tasks performed    Balance     End of Session PT - End of Session Equipment Utilized During Treatment: Gait belt Activity Tolerance: Patient tolerated treatment well Patient left: in chair;with call bell/phone within reach;with family/visitor present Nurse Communication: Mobility status   GP     Ferman Hamming 06/04/2012, 2:05 PM Weldon Picking PT Acute Rehab Services [phone] Beeper [phone]

## 2012-06-04 NOTE — Progress Notes (Signed)
Chart reviewed regarding renal transplant. She is getting all of her transplant immunosuppressants at proper doses (Cellcept 500 bid, sirolimus 3 mg/d and prednisone 10mg /day). Renal function is stable at baseline with creat 1.6-1.8 range.  Will be available if needed. Will leave office notes on the shadow chart.   Vinson Moselle  MD [employer] [phone] pgr    [phone] cell 06/04/2012, 7:05 PM

## 2012-06-05 LAB — GLUCOSE, CAPILLARY
Glucose-Capillary: 187 mg/dL — ABNORMAL HIGH (ref 70–99)
Glucose-Capillary: 344 mg/dL — ABNORMAL HIGH (ref 70–99)

## 2012-06-05 LAB — CBC
Hemoglobin: 10.7 g/dL — ABNORMAL LOW (ref 12.0–15.0)
MCH: 26.5 pg (ref 26.0–34.0)
MCV: 86.1 fL (ref 78.0–100.0)
Platelets: 177 10*3/uL (ref 150–400)
RBC: 4.04 MIL/uL (ref 3.87–5.11)

## 2012-06-05 LAB — BASIC METABOLIC PANEL
CO2: 34 mEq/L — ABNORMAL HIGH (ref 19–32)
Calcium: 9.7 mg/dL (ref 8.4–10.5)
Creatinine, Ser: 1.72 mg/dL — ABNORMAL HIGH (ref 0.50–1.10)
Glucose, Bld: 186 mg/dL — ABNORMAL HIGH (ref 70–99)

## 2012-06-05 MED ORDER — SULFAMETHOXAZOLE-TRIMETHOPRIM 400-80 MG PO TABS: 1.0000 | ORAL_TABLET | Freq: Two times a day (BID) | ORAL | Status: AC

## 2012-06-05 MED ORDER — SULFAMETHOXAZOLE-TRIMETHOPRIM 400-80 MG PO TABS
1.0000 | ORAL_TABLET | Freq: Two times a day (BID) | ORAL | Status: DC
  Administered 2012-06-05: 1 via ORAL
  Filled 2012-06-05 (×3): qty 1

## 2012-06-05 NOTE — Discharge Summary (Signed)
DISCHARGE SUMMARY  [name]  MR#: [id number]  DOB:03-01-1941  Date of Admission: 06/02/2012 Date of Discharge: 06/05/2012  Attending Physician:Nikki Rusnak [name]  Patient's WUJ:[id number] [name], MD  Consults:Treatment Team:  Kathi Ludwig, MD   Discharge Diagnoses: Active Problems : UTI (lower urinary tract infection)STENOTROPHOMONAS MALTOPHILIA  Hypertension, stable  Chronic kidney disease, stage III (moderate)  Metabolic encephalopathy, improved  Diabetes mellitus type 2, uncontrolled Hx renal transplant, stable Anemia, stable Hypokalemia, mild  Chronic back pain, on rx Herpetic eye infection, stable Diabetic neuropathy, on rx Hypothyroidism, on therapy Gastroesophageal reflux, on therapy    Discharge Medications: Medication List  As of 06/05/2012  8:12 AM   TAKE these medications         allopurinol 300 MG tablet   Commonly known as: ZYLOPRIM   Take 150 mg by mouth daily with breakfast.      amLODipine 10 MG tablet   Commonly known as: NORVASC   Take 10 mg by mouth daily with breakfast.      calcitRIOL 0.5 MCG capsule   Commonly known as: ROCALTROL   Take 0.5 mcg by mouth daily with breakfast.      calcium carbonate 500 MG chewable tablet   Commonly known as: TUMS - dosed in mg elemental calcium   Chew 2 tablets by mouth 3 (three) times daily with meals.      fentaNYL 50 MCG/HR   Commonly known as: DURAGESIC - dosed mcg/hr   Place 1 patch onto the skin every 3 (three) days.      fentaNYL 12 MCG/HR   Commonly known as: DURAGESIC - dosed mcg/hr   Place 1 patch onto the skin every 3 (three) days.      furosemide 40 MG tablet   Commonly known as: LASIX   Take 40 mg by mouth 2 (two) times daily.      gabapentin 400 MG capsule   Commonly known as: NEURONTIN   Take 400 mg by mouth 3 (three) times daily.      insulin detemir 100 UNIT/ML injection   Commonly known as: LEVEMIR   Inject 28 Units into the skin daily with breakfast.     insulin lispro 100 UNIT/ML injection   Commonly known as: HUMALOG   Inject 3-12 Units into the skin 3 (three) times daily before meals. Pt is on a sliding scale      ketoconazole 2 % cream   Commonly known as: NIZORAL   Apply 1 application topically 2 (two) times daily.      labetalol 200 MG tablet   Commonly known as: NORMODYNE   Take 300 mg by mouth 3 (three) times daily.      latanoprost 0.005 % ophthalmic solution   Commonly known as: XALATAN   Place 1 drop into the right eye at bedtime.      levothyroxine 112 MCG tablet   Commonly known as: SYNTHROID, LEVOTHROID   Take 224 mcg by mouth daily with breakfast.      magnesium oxide 400 MG tablet   Commonly known as: MAG-OX   Take 400 mg by mouth daily.      mycophenolate 250 MG capsule   Commonly known as: CELLCEPT   Take 500 mg by mouth 2 (two) times daily.      predniSONE 5 MG tablet   Commonly known as: DELTASONE   Take 10 mg by mouth daily with breakfast.      ranitidine 150 MG tablet   Commonly known as: ZANTAC  Take 150 mg by mouth daily.      sirolimus 1 MG tablet   Commonly known as: RAPAMUNE   Take 3 mg by mouth daily. Pt takes three 1 mg tablets = 3 mg daily      Vitamin D (Ergocalciferol) 50000 UNITS Caps   Commonly known as: DRISDOL   Take 50,000 Units by mouth every 7 (seven) days. On Wednesdays            Hospital Procedures: Dg Chest 2 View  06/02/2012  *RADIOLOGY REPORT*  Clinical Data: This.  Hyperglycemia.  Fever.  CHEST - 2 VIEW  Comparison: 04/30/2012  Findings: The cardiac silhouette is mild to moderately enlarged, but stable.  No mediastinal or hilar masses or adenopathy.  The lungs are clear.  Elevation of the right hemidiaphragm, mild, is stable.  The bony thorax is diffusely demineralized, but intact.  IMPRESSION: No acute cardiopulmonary disease.  Original Report Authenticated By: Domenic Moras, M.D.   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  VOIDING CYSTOURETHROGRAM  Technique:  After catheterization of the urinary bladder following sterile technique by nursing personnel, the bladder was filled with 250 ml Cysto-hypaque 30% by drip infusion.  Serial spot images were obtained during bladder filling and voiding.  Fluoroscopy Time: 2.43 minutes slow pulsed fluoroscopy  Comparison:  Pelvic MRI dated 03/12/2012  Findings: Contrast was instilled through the Foley catheter.  There was reflux of the transplanted ureter to the level of the calyces. The circumferential urethral diverticulum does fill with contrast during the voiding phase.  There is no evidence of extravasation from the bladder.  There is slight irregularity of the bladder wall.  IMPRESSION:  1.  The partially sized urethral diverticulum does fill with contrast during the voiding phase. 2.  No extravasation from the bladder. 3.  Reflux of contrast into the right transplant kidney collecting system.  Original Report Authenticated By: Gwynn Burly, M.D.    History of Present Illness: Fever and confusion  Hospital Course: Is a 71 year old black female with history of renal transplant and diabetes on chronic immunosuppression. She presented to my partner Dr. Timothy Lasso with fever confusion and a UTI. Fortunately she had not progressed to full urosepsis. She has a chronic indwelling catheter after a repair of a bladder diverticulum several weeks ago. She's had recurrent UTIs in several occasions. She was treated with broad-spectrum antibiotics and IV fluids and has done fairly well. She's had no fever in 48 hours. She is eating moderately well. Her mental status is markedly better and back to her baseline. Her blood sugars are reasonable. Blood pressure is fair. Renal function is stable at her  Baseline. Her chronic pain is under control. She notes no chills or sweats. We now identified the bacterium as noted above. It is sensitive to  trimethoprim sulfa and she's not allergic to this medication so this is what we will go with. She needs to see the urologist in the upcoming days to decide the status of catheter. This will be arranged.  Day of Discharge Exam BP 178/73  Pulse 69  Temp 98 F (36.7 C) (Oral)  Resp 17  Ht 5' 2.99" (1.6 m)  Wt 70.625 kg (155 lb 11.2 oz)  BMI 27.59 kg/m2  SpO2 93%  Physical Exam: General appearance: alert, sitting up in no distress. The left eye swelling is much less than before. There is some clouding of the cornea on  this side. Face is otherwise symmetric. Neck is supple. Resp: clear to auscultation bilaterally, no wheezes, rales, or rhonchi are present. Cardio: regular rate and rhythm with a systolic murmur. GI: soft, non-tender; bowel sounds normal; no masses,  no organomegaly, with well-healed scars. Extremities: no clubbing, cyanosis or edema Neuro: Alert and awake mentating fairly well. No tremor is present. Speech is clear.  Discharge Labs:  [facility] 06/04/12 0630 06/03/12 0605  NA 140 146*  K 3.4* 3.3*  CL 96 98  CO2 34* 40*  GLUCOSE 176* 94  BUN 33* 30*  CREATININE 1.70* 1.69*  CALCIUM 9.3 9.2  MG -- --  PHOS -- --      Basename 06/05/12 0700 06/04/12 0630 06/02/12 1141  WBC 3.8* 4.1 --  NEUTROABS -- -- 7.4  HGB 10.7* 11.6* --  HCT 34.8* 37.1 --  MCV 86.1 86.1 --  PLT 177 166 --   URINE CX: STENOTROPHOMONAS MALTOPHILIA     Discharge instructions: check sugars as before. Resume home meds as before Call if you have fever   Disposition: to home  Follow-up Appts: Follow-up with Dr. Evlyn Kanner at [facility] in 2 weeks Call for appointment.  Condition on Discharge: improved  Tests Needing Follow-up: none  Please send a copy of the note to Dr. Jerilee Field at [facility] Please also send a copy to [facility] Signed: Julian Hy 06/05/2012, 8:12 AM

## 2012-06-05 NOTE — Progress Notes (Signed)
Patient was discharged home with discharge instructions and prescriptions. Patient was told about appointment times and was told to call doctor or go to the emergency room if signs and symptoms reappear or in case of an emergency.

## 2012-06-05 NOTE — Progress Notes (Signed)
Physical Therapy Treatment Patient Details Name: [name] MRN: [id number] DOB: [birth year] Today's Date: 06/05/2012 Time: 1308-6578 PT Time Calculation (min): 14 min  PT Assessment / Plan / Recommendation Comments on Treatment Session  Pt. with progressing mobility.  Husband states pt. is close to her baseline and feel they will mangae well at home.      Follow Up Recommendations  Home health PT;Supervision/Assistance - 24 hour    Barriers to Discharge        Equipment Recommendations  None recommended by PT    Recommendations for Other Services    Frequency Min 3X/week   Plan Discharge plan remains appropriate    Precautions / Restrictions Precautions Precautions: Fall Restrictions Weight Bearing Restrictions: No   Pertinent Vitals/Pain No pain, no distress    Mobility  Bed Mobility Bed Mobility: Rolling Right;Right Sidelying to Sit Rolling Right: 5: Supervision;With rail Right Sidelying to Sit: 5: Supervision;With rails Sitting - Scoot to Edge of Bed: 5: Supervision Sit to Supine: 5: Supervision Details for Bed Mobility Assistance: Pt. appears shakey and trembly this am.  She was able to manage her bed mobility at the supervision level today (supervision for safety). Transfers Transfers: Sit to Stand;Stand to Sit Sit to Stand: 4: Min guard;With upper extremity assist;From bed Stand to Sit: 4: Min guard;With upper extremity assist;To chair/3-in-1 Details for Transfer Assistance: min guard assist for safety , no physical touch needed today.   Ambulation/Gait Ambulation/Gait Assistance: 4: Min guard Ambulation Distance (Feet): 40 Feet Assistive device: Rolling walker Ambulation/Gait Assistance Details: needed cues for posture and for safety with RW Gait Pattern: Decreased stride length;Trunk flexed    Exercises     PT Diagnosis:    PT Problem List:   PT Treatment Interventions:     PT Goals Acute Rehab PT Goals PT Goal: Supine/Side to Sit - Progress:  Progressing toward goal PT Goal: Sit to Stand - Progress: Progressing toward goal PT Goal: Stand to Sit - Progress: Progressing toward goal PT Goal: Ambulate - Progress: Progressing toward goal  Visit Information  Last PT Received On: 06/05/12 Assistance Needed: +1    Subjective Data  Subjective: Pt. and husband report they believe they will manage well at home upon DC.   Cognition  Overall Cognitive Status: Appears within functional limits for tasks assessed/performed Arousal/Alertness: Awake/alert Orientation Level: Appears intact for tasks assessed Behavior During Session: [facility] for tasks performed    Balance     End of Session PT - End of Session Equipment Utilized During Treatment: Gait belt Activity Tolerance: Patient limited by fatigue;Patient tolerated treatment well Patient left: in chair;with call bell/phone within reach;with family/visitor present Nurse Communication: Mobility status   GP     Ferman Hamming 06/05/2012, 11:42 AM Weldon Picking PT Acute Rehab Services [phone] Beeper [phone]

## 2012-06-09 LAB — CULTURE, BLOOD (ROUTINE X 2): Culture: NO GROWTH

## 2012-06-11 ENCOUNTER — Other Ambulatory Visit (HOSPITAL_COMMUNITY): Payer: Self-pay | Admitting: Urology

## 2012-06-11 DIAGNOSIS — N361 Urethral diverticulum: Secondary | ICD-10-CM

## 2012-06-18 ENCOUNTER — Encounter (HOSPITAL_COMMUNITY)
Admission: RE | Admit: 2012-06-18 | Discharge: 2012-06-18 | Disposition: A | Discharge status: Home / Self Care | Payer: Medicare Other | Source: Ambulatory Visit | Attending: Nephrology | Admitting: Nephrology

## 2012-06-18 DIAGNOSIS — R6889 Other general symptoms and signs: Secondary | ICD-10-CM | POA: Insufficient documentation

## 2012-06-18 DIAGNOSIS — N2581 Secondary hyperparathyroidism of renal origin: Secondary | ICD-10-CM | POA: Insufficient documentation

## 2012-06-18 DIAGNOSIS — E119 Type 2 diabetes mellitus without complications: Secondary | ICD-10-CM | POA: Insufficient documentation

## 2012-06-18 DIAGNOSIS — N183 Chronic kidney disease, stage 3 unspecified: Secondary | ICD-10-CM | POA: Insufficient documentation

## 2012-06-18 DIAGNOSIS — D638 Anemia in other chronic diseases classified elsewhere: Secondary | ICD-10-CM | POA: Insufficient documentation

## 2012-06-18 LAB — PROTEIN / CREATININE RATIO, URINE
Creatinine, Urine: 82.65 mg/dL
Total Protein, Urine: 54.5 mg/dL

## 2012-06-18 LAB — COMPREHENSIVE METABOLIC PANEL
ALT: 7 U/L (ref 0–35)
AST: 12 U/L (ref 0–37)
Albumin: 3.7 g/dL (ref 3.5–5.2)
Alkaline Phosphatase: 58 U/L (ref 39–117)
Glucose, Bld: 284 mg/dL — ABNORMAL HIGH (ref 70–99)
Potassium: 3.3 mEq/L — ABNORMAL LOW (ref 3.5–5.1)
Sodium: 142 mEq/L (ref 135–145)
Total Protein: 7.2 g/dL (ref 6.0–8.3)

## 2012-06-18 LAB — URINALYSIS, ROUTINE W REFLEX MICROSCOPIC
Glucose, UA: 250 mg/dL — AB
Specific Gravity, Urine: 1.015 (ref 1.005–1.030)
pH: 6 (ref 5.0–8.0)

## 2012-06-18 LAB — URINE MICROSCOPIC-ADD ON

## 2012-06-18 LAB — POCT HEMOGLOBIN-HEMACUE: Hemoglobin: 11.9 g/dL — ABNORMAL LOW (ref 12.0–15.0)

## 2012-06-18 LAB — URIC ACID: Uric Acid, Serum: 6.5 mg/dL (ref 2.4–7.0)

## 2012-06-18 MED ORDER — EPOETIN ALFA 10000 UNIT/ML IJ SOLN
5000.0000 [IU] | INTRAMUSCULAR | Status: DC
  Administered 2012-06-18: 09:00:00 via SUBCUTANEOUS

## 2012-06-18 MED ORDER — EPOETIN ALFA 10000 UNIT/ML IJ SOLN
INTRAMUSCULAR | Status: AC
  Filled 2012-06-18: qty 1

## 2012-06-22 LAB — URINE CULTURE: Colony Count: >=100000

## 2012-06-23 ENCOUNTER — Ambulatory Visit (HOSPITAL_COMMUNITY)
Admission: RE | Admit: 2012-06-23 | Discharge: 2012-06-23 | Disposition: A | Discharge status: Home / Self Care | Payer: Medicare Other | Source: Ambulatory Visit | Attending: Urology | Admitting: Urology

## 2012-06-23 DIAGNOSIS — N361 Urethral diverticulum: Secondary | ICD-10-CM | POA: Insufficient documentation

## 2012-06-23 MED ORDER — DIATRIZOATE MEGLUMINE 30 % UR SOLN: Freq: Once | URETHRAL | Status: AC | PRN

## 2012-06-27 ENCOUNTER — Emergency Department (HOSPITAL_COMMUNITY): Payer: Medicare Other

## 2012-06-27 ENCOUNTER — Encounter (HOSPITAL_COMMUNITY): Payer: Self-pay | Admitting: Emergency Medicine

## 2012-06-27 ENCOUNTER — Emergency Department (HOSPITAL_COMMUNITY)
Admission: EM | Admit: 2012-06-27 | Discharge: 2012-06-27 | Disposition: A | Discharge status: Home / Self Care | Payer: Medicare Other | Attending: Emergency Medicine | Admitting: Emergency Medicine

## 2012-06-27 DIAGNOSIS — I129 Hypertensive chronic kidney disease with stage 1 through stage 4 chronic kidney disease, or unspecified chronic kidney disease: Secondary | ICD-10-CM | POA: Insufficient documentation

## 2012-06-27 DIAGNOSIS — Z940 Kidney transplant status: Secondary | ICD-10-CM | POA: Insufficient documentation

## 2012-06-27 DIAGNOSIS — R5381 Other malaise: Secondary | ICD-10-CM | POA: Insufficient documentation

## 2012-06-27 DIAGNOSIS — R531 Weakness: Secondary | ICD-10-CM

## 2012-06-27 DIAGNOSIS — E119 Type 2 diabetes mellitus without complications: Secondary | ICD-10-CM | POA: Insufficient documentation

## 2012-06-27 DIAGNOSIS — F29 Unspecified psychosis not due to a substance or known physiological condition: Secondary | ICD-10-CM | POA: Insufficient documentation

## 2012-06-27 DIAGNOSIS — N189 Chronic kidney disease, unspecified: Secondary | ICD-10-CM | POA: Insufficient documentation

## 2012-06-27 DIAGNOSIS — Z794 Long term (current) use of insulin: Secondary | ICD-10-CM | POA: Insufficient documentation

## 2012-06-27 DIAGNOSIS — N390 Urinary tract infection, site not specified: Secondary | ICD-10-CM | POA: Insufficient documentation

## 2012-06-27 LAB — CBC
HCT: 36.1 % (ref 36.0–46.0)
MCH: 26.4 pg (ref 26.0–34.0)
MCHC: 30.7 g/dL (ref 30.0–36.0)
MCV: 86 fL (ref 78.0–100.0)
Platelets: 195 10*3/uL (ref 150–400)
RDW: 18.1 % — ABNORMAL HIGH (ref 11.5–15.5)
WBC: 13.6 10*3/uL — ABNORMAL HIGH (ref 4.0–10.5)

## 2012-06-27 LAB — BASIC METABOLIC PANEL
BUN: 38 mg/dL — ABNORMAL HIGH (ref 6–23)
Calcium: 9.6 mg/dL (ref 8.4–10.5)
Chloride: 97 mEq/L (ref 96–112)
Creatinine, Ser: 2.45 mg/dL — ABNORMAL HIGH (ref 0.50–1.10)
GFR calc Af Amer: 21 mL/min — ABNORMAL LOW (ref >90)
GFR calc non Af Amer: 18 mL/min — ABNORMAL LOW (ref >90)

## 2012-06-27 LAB — URINALYSIS, ROUTINE W REFLEX MICROSCOPIC
Bilirubin Urine: NEGATIVE
Nitrite: NEGATIVE
Specific Gravity, Urine: 1.016 (ref 1.005–1.030)
pH: 6 (ref 5.0–8.0)

## 2012-06-27 LAB — URINE MICROSCOPIC-ADD ON

## 2012-06-27 LAB — URINALYSIS, MICROSCOPIC ONLY
Bilirubin Urine: NEGATIVE
Nitrite: NEGATIVE
Specific Gravity, Urine: 1.016 (ref 1.005–1.030)
Urobilinogen, UA: 0.2 mg/dL (ref 0.0–1.0)
pH: 5.5 (ref 5.0–8.0)

## 2012-06-27 LAB — GLUCOSE, CAPILLARY: Glucose-Capillary: 196 mg/dL — ABNORMAL HIGH (ref 70–99)

## 2012-06-27 LAB — TROPONIN I: Troponin I: <0.3 ng/mL (ref <0.30)

## 2012-06-27 MED ORDER — IBUPROFEN 200 MG PO TABS
600.0000 mg | ORAL_TABLET | Freq: Once | ORAL | Status: AC
  Administered 2012-06-27: 600 mg via ORAL
  Filled 2012-06-27: qty 3

## 2012-06-27 MED ORDER — DEXTROSE 5 % IV SOLN
1.0000 g | Freq: Once | INTRAVENOUS | Status: AC
  Administered 2012-06-27: 1 g via INTRAVENOUS
  Filled 2012-06-27: qty 10

## 2012-06-27 MED ORDER — CEPHALEXIN 500 MG PO CAPS: 500.0000 mg | ORAL_CAPSULE | Freq: Four times a day (QID) | ORAL | Status: AC

## 2012-06-27 MED ORDER — VANCOMYCIN HCL IN DEXTROSE 1-5 GM/200ML-% IV SOLN
1000.0000 mg | Freq: Once | INTRAVENOUS | Status: AC
  Administered 2012-06-27: 1000 mg via INTRAVENOUS
  Filled 2012-06-27: qty 200

## 2012-06-27 MED ORDER — NALOXONE HCL 0.4 MG/ML IJ SOLN: 0.4000 mg | Freq: Once | INTRAMUSCULAR | Status: DC

## 2012-06-27 MED ORDER — SODIUM CHLORIDE 0.9 % IV SOLN
1000.0000 mL | INTRAVENOUS | Status: DC
  Administered 2012-06-27: 1000 mL via INTRAVENOUS

## 2012-06-27 MED ORDER — DEXTROSE 5 % IV SOLN
1.0000 g | INTRAVENOUS | Status: DC
  Administered 2012-06-27: 1 g via INTRAVENOUS
  Filled 2012-06-27: qty 1

## 2012-06-27 MED ORDER — SODIUM CHLORIDE 0.9 % IV SOLN
1000.0000 mL | Freq: Once | INTRAVENOUS | Status: AC
  Administered 2012-06-27: 1000 mL via INTRAVENOUS

## 2012-06-27 NOTE — ED Notes (Signed)
Patient is alert and oriented x4. Discharge instructions were explained to her and her husband and no questions were asked.  Patient is not complaining of pain.

## 2012-06-27 NOTE — ED Notes (Signed)
She has a catheter in place after surgery and she gets UtI

## 2012-06-27 NOTE — ED Provider Notes (Signed)
History     CSN: [id number]  Arrival date & time 06/27/12  1040   First MD Initiated Contact with Patient 06/27/12 1328      Chief Complaint  Patient presents with  . Weakness     The history is provided by the patient.   does reports that the patient has been more weak and sleepy today and that this is that she usually gets when she has urinary tract infection.  She has a chronic indwelling Foley catheter.  She's also reported some lower suprapubic abdominal pain.  She's had a fever to 100.8 on arrival.  Her initial O2 sats on arrival were 88% per the nursing note however on my reevaluation her O2 sats are 99% on room air.  She denies cough or congestion.  She's had no recent head trauma.  She is on Duragesic patches for chronic pain.  She reports her dose has not changed.  She reports she just feels generally weak and tired. She does not abuse drugs or alcohol.  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  She also has a history of diabetes hypothyroidism hypertension and chronic renal insufficiency  Past Medical History  Diagnosis Date  . Diabetes mellitus   . Diabetic neuropathy   . Hypothyroidism   . Hypertension   . GERD (gastroesophageal reflux disease)   . Heart murmur     not treated for  . Arthritis   . Chronic kidney disease     RENAL TRANSPLANT IN 2008--PT IS FOLLOWED BY DR. Karie Fetch WITH BUN OF 37 AND CREAT 1.7--PER NOTE DR. NESI FROM 04/11/12  . Urethral diverticulum   . Eyesight diminished     GLAUCOMA   . History of shingles     LEFT EYE JAN 2012--STILL HAS EYE PAIN-STATES NOT ABLE TO TAKE THE MEDICATION FOR SHINGLES BECAUSE OF HER HX OF KIDNEY TRANSPLANT  . Fingernail abnormalities     FUNGUS OF FINGERNAILS    Past Surgical History  Procedure Date  . Kidney transplant 2008    At [facility]  . Back surgery     ruptured disk  . Abdominal hysterectomy   . Appendectomy   . Laser surgery of both eyes for hemorrhages   .  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 Urethral diverticulectomy 04/28/2012    Procedure: URETHRAL DIVERTICULECTOMY;  Surgeon: Antony Haste, MD;  Location: [facility];  Service: Urology;  Laterality: N/A;    Family History  Problem Relation Age of Onset  . Anesthesia problems Neg Hx     History  Substance Use Topics  . Smoking status: Former Smoker -- 0.5 packs/day for 20 years    Quit date: 10/21/1978  . Smokeless tobacco: Never Used  . Alcohol Use: No    OB History    Grav Para Term Preterm Abortions TAB SAB Ect Mult Living                  Review of Systems  All other systems reviewed and are negative.    Allergies  Adhesive; Aspirin; Hydrocodone; Iohexol; Oxycodone; Penicillins; Tacrolimus; and Contrast media  Home Medications   Current Outpatient Rx  Name Route Sig Dispense Refill  . ALLOPURINOL 300 MG PO TABS Oral Take 150 mg by mouth daily with breakfast.     . AMLODIPINE BESYLATE 10 MG PO TABS Oral Take 10 mg by mouth daily with breakfast.     . CALCITRIOL 0.5 MCG PO CAPS Oral Take  0.5 mcg by mouth daily with breakfast.     . CALCIUM CARBONATE ANTACID 500 MG PO CHEW Oral Chew 2 tablets by mouth 3 (three) times daily with meals.    . FENTANYL 12 MCG/HR TD PT72 Transdermal Place 1 patch onto the skin every 3 (three) days.    . FENTANYL 50 MCG/HR TD PT72 Transdermal Place 1 patch onto the skin every 3 (three) days.    . FUROSEMIDE 40 MG PO TABS Oral Take 40 mg by mouth 2 (two) times daily.    Marland Kitchen GABAPENTIN 400 MG PO CAPS Oral Take 400 mg by mouth 3 (three) times daily.    . INSULIN DETEMIR 100 UNIT/ML [place] SOLN Subcutaneous Inject 28 Units into the skin daily with breakfast.     . INSULIN LISPRO (HUMAN) 100 UNIT/ML  SOLN Subcutaneous Inject 3-12 Units into the skin 3 (three) times daily before meals. Pt is on a sliding scale    . KETOCONAZOLE 2 % EX CREA Topical Apply 1 application topically 2 (two) times daily.    Marland Kitchen LABETALOL HCL 200 MG PO TABS Oral Take 300 mg  by mouth 3 (three) times daily.     Marland Kitchen LATANOPROST 0.005 % OP SOLN Right Eye Place 1 drop into the right eye at bedtime.    Marland Kitchen LEVOTHYROXINE SODIUM 112 MCG PO TABS Oral Take 224 mcg by mouth daily with breakfast.    . MAGNESIUM OXIDE 400 MG PO TABS Oral Take 400 mg by mouth daily.     Marland Kitchen MYCOPHENOLATE MOFETIL 250 MG PO CAPS Oral Take 500 mg by mouth 2 (two) times daily.    Marland Kitchen PREDNISONE 5 MG PO TABS Oral Take 10 mg by mouth daily with breakfast.     . RANITIDINE HCL 150 MG PO TABS Oral Take 150 mg by mouth daily.    Marland Kitchen SIROLIMUS 1 MG PO TABS Oral Take 3 mg by mouth daily. Pt takes three 1 mg tablets = 3 mg daily    . UREA 40 % EX CREA Topical Apply 1 application topically 2 (two) times daily. Apply to finger nails, and cuticles.    Marland Kitchen VITAMIN D (ERGOCALCIFEROL) 50000 UNITS PO CAPS Oral Take 50,000 Units by mouth every 7 (seven) days. On Wednesdays      BP 148/45  Pulse 79  Temp 100.8 F (38.2 C) (Oral)  Resp 16  SpO2 88%  Physical Exam  Nursing note and vitals reviewed. Constitutional: She appears well-developed and well-nourished. No distress.  HENT:  Head: Normocephalic and atraumatic.  Eyes: EOM are normal.  Neck: Normal range of motion.  Cardiovascular: Normal rate, regular rhythm and normal heart sounds.   Pulmonary/Chest: Effort normal and breath sounds normal.  Abdominal: Soft. She exhibits no distension. There is no tenderness.  Genitourinary:       Chronic indwelling Foley catheter  Musculoskeletal: Normal range of motion.  Neurological: She is alert.       Arouses easily to voice and sound  Skin: Skin is warm and dry.  Psychiatric: She has a normal mood and affect. Judgment normal.    ED Course  Procedures (including critical care time)  Labs Reviewed  URINALYSIS, ROUTINE W REFLEX MICROSCOPIC - Abnormal; Notable for the following:    APPearance CLOUDY (*)     Glucose, UA 100 (*)     Hgb urine dipstick LARGE (*)     Protein, ur 100 (*)     Leukocytes, UA LARGE (*)       All other  components within normal limits  URINE MICROSCOPIC-ADD ON - Abnormal; Notable for the following:    Casts HYALINE CASTS (*)     All other components within normal limits  CBC - Abnormal; Notable for the following:    WBC 13.6 (*)     Hemoglobin 11.1 (*)     RDW 18.1 (*)     All other components within normal limits  BASIC METABOLIC PANEL - Abnormal; Notable for the following:    CO2 35 (*)     Glucose, Bld 148 (*)     BUN 38 (*)     Creatinine, Ser 2.45 (*)     GFR calc non Af Amer 18 (*)     GFR calc Af Amer 21 (*)     All other components within normal limits  TROPONIN I  LACTIC ACID, PLASMA  CULTURE, BLOOD (ROUTINE X 2)  CULTURE, BLOOD (ROUTINE X 2)  URINE CULTURE  URINALYSIS, WITH MICROSCOPIC   Dg Chest 2 View  06/27/2012  *RADIOLOGY REPORT*  Clinical Data: Weakness and confusion  CHEST - 2 VIEW  Comparison: Chest radiograph 06/02/2012 and 04/30/2012  Findings: Stable cardiomegaly.  Atherosclerotic calcification of the thoracic aortic arch.    Slight left basilar atelectasis.  Tiny bilateral pleural effusions are suspected in the posterior costophrenic angles on the lateral view. Bilateral high-riding shoulders.  Surgical clips in the thyroid bed.  IMPRESSION:  1.  Cardiomegaly. 2.  Suspect tiny bilateral pleural effusions and slight left basilar atelectasis.   Original Report Authenticated By: Britta Mccreedy, M.D.    Ct Head Wo Contrast  06/27/2012  *RADIOLOGY REPORT*  Clinical Data: 71 year old female weakness.  CT HEAD WITHOUT CONTRAST  Technique:  Contiguous axial images were obtained from the base of the skull through the vertex without contrast.  Comparison: 05/11/2011 and earlier.  Findings: Stable mild ethmoid sinus mucosal thickening.  Other Visualized paranasal sinuses and mastoids are clear.  No acute osseous abnormality identified.  Negative orbit and scalp soft tissues.  Calcified atherosclerosis at the skull base.  Stable cerebral volume.  No ventriculomegaly.  No midline shift, mass effect, or evidence of mass lesion.  No acute intracranial hemorrhage identified.  Minimal white matter hypodensity. No evidence of cortically based acute infarction identified.  No suspicious intracranial vascular hyperdensity.  IMPRESSION: Stable, negative for age noncontrast CT appearance of the brain.   Original Report Authenticated By: Ulla Potash III, M.D.      1. Weakness   2. Urinary tract infection       MDM  For surgeries and the patient's alertness significantly improved on arrival to the emergency department.  Her CT head and chest x-ray are normal.  Blood and urine cultures are pending.  The patient is a 21-50 white blood cells with leukocytes and therefore she'll be covered for possible urinary tract infection.  Urine culture sent.  She'll need primary care followup on Monday for a recheck.  At that time her urine culture results can be reviewed and if he demonstrates no evidence of culture positive bacteria then antibiotics can be stopped.  Her abdomen is benign.  Her mental status is returned to baseline.  Some of this may represented acute delirium from her fever.  Her physical patches were also removed which I think need to be reevaluated by her primary care physician as that may of been affecting her mental status.  The patient and family understand to return to the emergency apartment for new or worsening symptoms  Lyanne Co, MD 06/27/12 [phone]

## 2012-06-27 NOTE — ED Notes (Signed)
Husband stated, shes real weak and sleepy and when she gets like this she usually has a UTI , she also has lower stomach pain.

## 2012-06-27 NOTE — ED Notes (Signed)
Removed Duragesic patch dose and next to each other left upper back.

## 2012-06-27 NOTE — ED Notes (Signed)
Pt put on 2 l of O2 in triage waiting

## 2012-06-27 NOTE — ED Notes (Signed)
Patient stated onset 3 days ago general weakness and sleepy intermittently. Husband at bedside states sometimes in the past these symptoms resemble UTI. Airway intact bilateral equal chest rise and fall. Ax4 answering and following commands appropriate.  States feels more awake now compared to this morning.

## 2012-06-27 NOTE — ED Notes (Signed)
Blood cultures drawn prior to administering antibiotics.

## 2012-07-01 LAB — URINE CULTURE

## 2012-07-02 NOTE — ED Notes (Signed)
+   urine Chart sent to EDP office for review. 

## 2012-07-03 LAB — CULTURE, BLOOD (ROUTINE X 2): Culture: NO GROWTH

## 2012-07-05 ENCOUNTER — Telehealth (HOSPITAL_COMMUNITY): Payer: Self-pay | Admitting: Emergency Medicine

## 2012-07-06 ENCOUNTER — Telehealth (HOSPITAL_COMMUNITY): Payer: Self-pay | Admitting: *Deleted

## 2012-07-06 ENCOUNTER — Other Ambulatory Visit (HOSPITAL_COMMUNITY): Payer: Self-pay | Admitting: Urology

## 2012-07-06 DIAGNOSIS — N361 Urethral diverticulum: Secondary | ICD-10-CM

## 2012-07-06 NOTE — ED Notes (Signed)
Rx for Nitrofurantoin 100 mg sig: one tablet po BID #10 no refills written by Coolidge Breeze needs to be called to pharmacy.

## 2012-07-06 NOTE — ED Notes (Addendum)
rx called to [facility].-[phone] by Carollee Herter  PFM.

## 2012-07-15 ENCOUNTER — Other Ambulatory Visit (HOSPITAL_COMMUNITY): Payer: Self-pay | Admitting: *Deleted

## 2012-07-16 ENCOUNTER — Encounter (HOSPITAL_COMMUNITY)
Admission: RE | Admit: 2012-07-16 | Discharge: 2012-07-16 | Disposition: A | Discharge status: Home / Self Care | Payer: Medicare Other | Source: Ambulatory Visit | Attending: Nephrology | Admitting: Nephrology

## 2012-07-16 DIAGNOSIS — N183 Chronic kidney disease, stage 3 unspecified: Secondary | ICD-10-CM | POA: Insufficient documentation

## 2012-07-16 DIAGNOSIS — R6889 Other general symptoms and signs: Secondary | ICD-10-CM | POA: Insufficient documentation

## 2012-07-16 DIAGNOSIS — N2581 Secondary hyperparathyroidism of renal origin: Secondary | ICD-10-CM | POA: Insufficient documentation

## 2012-07-16 DIAGNOSIS — D638 Anemia in other chronic diseases classified elsewhere: Secondary | ICD-10-CM | POA: Insufficient documentation

## 2012-07-16 DIAGNOSIS — E119 Type 2 diabetes mellitus without complications: Secondary | ICD-10-CM | POA: Insufficient documentation

## 2012-07-16 LAB — COMPREHENSIVE METABOLIC PANEL
Alkaline Phosphatase: 60 U/L (ref 39–117)
BUN: 38 mg/dL — ABNORMAL HIGH (ref 6–23)
CO2: 36 mEq/L — ABNORMAL HIGH (ref 19–32)
Chloride: 95 mEq/L — ABNORMAL LOW (ref 96–112)
Creatinine, Ser: 2.4 mg/dL — ABNORMAL HIGH (ref 0.50–1.10)
GFR calc non Af Amer: 19 mL/min — ABNORMAL LOW (ref >90)
Glucose, Bld: 241 mg/dL — ABNORMAL HIGH (ref 70–99)
Total Bilirubin: 0.6 mg/dL (ref 0.3–1.2)

## 2012-07-16 LAB — URINE MICROSCOPIC-ADD ON

## 2012-07-16 LAB — PHOSPHORUS: Phosphorus: 4 mg/dL (ref 2.3–4.6)

## 2012-07-16 LAB — URINALYSIS, ROUTINE W REFLEX MICROSCOPIC
Bilirubin Urine: NEGATIVE
Ketones, ur: NEGATIVE mg/dL
Nitrite: NEGATIVE
Urobilinogen, UA: 0.2 mg/dL (ref 0.0–1.0)
pH: 5.5 (ref 5.0–8.0)

## 2012-07-16 LAB — IRON AND TIBC: Iron: 48 ug/dL (ref 42–135)

## 2012-07-16 MED ORDER — EPOETIN ALFA 10000 UNIT/ML IJ SOLN: 5000.0000 [IU] | INTRAMUSCULAR | Status: DC

## 2012-07-17 ENCOUNTER — Ambulatory Visit (HOSPITAL_COMMUNITY)
Admission: RE | Admit: 2012-07-17 | Discharge: 2012-07-17 | Disposition: A | Discharge status: Home / Self Care | Payer: Medicare Other | Source: Ambulatory Visit | Attending: Urology | Admitting: Urology

## 2012-07-17 DIAGNOSIS — Q638 Other specified congenital malformations of kidney: Secondary | ICD-10-CM | POA: Insufficient documentation

## 2012-07-17 DIAGNOSIS — N1370 Vesicoureteral-reflux, unspecified: Secondary | ICD-10-CM | POA: Insufficient documentation

## 2012-07-17 DIAGNOSIS — Z09 Encounter for follow-up examination after completed treatment for conditions other than malignant neoplasm: Secondary | ICD-10-CM | POA: Insufficient documentation

## 2012-07-17 DIAGNOSIS — N368 Other specified disorders of urethra: Secondary | ICD-10-CM | POA: Insufficient documentation

## 2012-07-17 DIAGNOSIS — N361 Urethral diverticulum: Secondary | ICD-10-CM

## 2012-07-17 LAB — SIROLIMUS LEVEL: Sirolimus (Rapamycin): 10.4

## 2012-07-17 MED ORDER — DIATRIZOATE MEGLUMINE 30 % UR SOLN: Freq: Once | URETHRAL | Status: AC | PRN

## 2012-08-13 ENCOUNTER — Encounter (HOSPITAL_COMMUNITY)
Admission: RE | Admit: 2012-08-13 | Discharge: 2012-08-13 | Disposition: A | Discharge status: Home / Self Care | Payer: Medicare Other | Source: Ambulatory Visit | Attending: Nephrology | Admitting: Nephrology

## 2012-08-13 DIAGNOSIS — E119 Type 2 diabetes mellitus without complications: Secondary | ICD-10-CM | POA: Insufficient documentation

## 2012-08-13 DIAGNOSIS — R6889 Other general symptoms and signs: Secondary | ICD-10-CM | POA: Insufficient documentation

## 2012-08-13 DIAGNOSIS — N183 Chronic kidney disease, stage 3 unspecified: Secondary | ICD-10-CM | POA: Insufficient documentation

## 2012-08-13 DIAGNOSIS — N2581 Secondary hyperparathyroidism of renal origin: Secondary | ICD-10-CM | POA: Insufficient documentation

## 2012-08-13 DIAGNOSIS — D638 Anemia in other chronic diseases classified elsewhere: Secondary | ICD-10-CM | POA: Insufficient documentation

## 2012-08-13 LAB — COMPREHENSIVE METABOLIC PANEL
ALT: 7 U/L (ref 0–35)
AST: 18 U/L (ref 0–37)
CO2: 29 mEq/L (ref 19–32)
Chloride: 96 mEq/L (ref 96–112)
GFR calc Af Amer: 27 mL/min — ABNORMAL LOW (ref >90)
GFR calc non Af Amer: 23 mL/min — ABNORMAL LOW (ref >90)
Glucose, Bld: 225 mg/dL — ABNORMAL HIGH (ref 70–99)
Sodium: 141 mEq/L (ref 135–145)
Total Bilirubin: 0.8 mg/dL (ref 0.3–1.2)

## 2012-08-13 LAB — FERRITIN: Ferritin: 696 ng/mL — ABNORMAL HIGH (ref 10–291)

## 2012-08-13 LAB — IRON AND TIBC
Iron: 56 ug/dL (ref 42–135)
TIBC: 237 ug/dL — ABNORMAL LOW (ref 250–470)

## 2012-08-13 LAB — URINALYSIS, MICROSCOPIC ONLY
Glucose, UA: 100 mg/dL — AB
Specific Gravity, Urine: 1.018 (ref 1.005–1.030)
pH: 5.5 (ref 5.0–8.0)

## 2012-08-13 MED ORDER — EPOETIN ALFA 10000 UNIT/ML IJ SOLN: 5000.0000 [IU] | INTRAMUSCULAR | Status: DC

## 2012-08-14 LAB — URINE CULTURE
Colony Count: NO GROWTH
Culture: NO GROWTH

## 2012-08-22 ENCOUNTER — Emergency Department (HOSPITAL_COMMUNITY): Payer: Medicare Other

## 2012-08-22 ENCOUNTER — Emergency Department (HOSPITAL_COMMUNITY)
Admission: EM | Admit: 2012-08-22 | Discharge: 2012-08-22 | Disposition: A | Discharge status: Home / Self Care | Payer: Medicare Other | Attending: Emergency Medicine | Admitting: Emergency Medicine

## 2012-08-22 ENCOUNTER — Encounter (HOSPITAL_COMMUNITY): Payer: Self-pay | Admitting: Emergency Medicine

## 2012-08-22 DIAGNOSIS — K219 Gastro-esophageal reflux disease without esophagitis: Secondary | ICD-10-CM | POA: Insufficient documentation

## 2012-08-22 DIAGNOSIS — Z792 Long term (current) use of antibiotics: Secondary | ICD-10-CM | POA: Insufficient documentation

## 2012-08-22 DIAGNOSIS — I129 Hypertensive chronic kidney disease with stage 1 through stage 4 chronic kidney disease, or unspecified chronic kidney disease: Secondary | ICD-10-CM | POA: Insufficient documentation

## 2012-08-22 DIAGNOSIS — G589 Mononeuropathy, unspecified: Secondary | ICD-10-CM | POA: Insufficient documentation

## 2012-08-22 DIAGNOSIS — M129 Arthropathy, unspecified: Secondary | ICD-10-CM | POA: Insufficient documentation

## 2012-08-22 DIAGNOSIS — N390 Urinary tract infection, site not specified: Secondary | ICD-10-CM | POA: Insufficient documentation

## 2012-08-22 DIAGNOSIS — N189 Chronic kidney disease, unspecified: Secondary | ICD-10-CM | POA: Insufficient documentation

## 2012-08-22 DIAGNOSIS — E1149 Type 2 diabetes mellitus with other diabetic neurological complication: Secondary | ICD-10-CM | POA: Insufficient documentation

## 2012-08-22 DIAGNOSIS — Z940 Kidney transplant status: Secondary | ICD-10-CM | POA: Insufficient documentation

## 2012-08-22 DIAGNOSIS — Z79899 Other long term (current) drug therapy: Secondary | ICD-10-CM | POA: Insufficient documentation

## 2012-08-22 DIAGNOSIS — E039 Hypothyroidism, unspecified: Secondary | ICD-10-CM | POA: Insufficient documentation

## 2012-08-22 DIAGNOSIS — R4182 Altered mental status, unspecified: Secondary | ICD-10-CM | POA: Insufficient documentation

## 2012-08-22 DIAGNOSIS — H409 Unspecified glaucoma: Secondary | ICD-10-CM | POA: Insufficient documentation

## 2012-08-22 DIAGNOSIS — Z8619 Personal history of other infectious and parasitic diseases: Secondary | ICD-10-CM | POA: Insufficient documentation

## 2012-08-22 DIAGNOSIS — Z794 Long term (current) use of insulin: Secondary | ICD-10-CM | POA: Insufficient documentation

## 2012-08-22 LAB — CBC WITH DIFFERENTIAL/PLATELET
Eosinophils Absolute: 0.1 10*3/uL (ref 0.0–0.7)
Lymphs Abs: 0.6 10*3/uL — ABNORMAL LOW (ref 0.7–4.0)
MCH: 26.3 pg (ref 26.0–34.0)
Neutro Abs: 5.1 10*3/uL (ref 1.7–7.7)
Neutrophils Relative %: 84 % — ABNORMAL HIGH (ref 43–77)
Platelets: 141 10*3/uL — ABNORMAL LOW (ref 150–400)
RBC: 4.64 MIL/uL (ref 3.87–5.11)
WBC: 6.1 10*3/uL (ref 4.0–10.5)

## 2012-08-22 LAB — COMPREHENSIVE METABOLIC PANEL
ALT: 7 U/L (ref 0–35)
Albumin: 3.6 g/dL (ref 3.5–5.2)
Alkaline Phosphatase: 52 U/L (ref 39–117)
Glucose, Bld: 236 mg/dL — ABNORMAL HIGH (ref 70–99)
Potassium: 3.2 mEq/L — ABNORMAL LOW (ref 3.5–5.1)
Sodium: 145 mEq/L (ref 135–145)
Total Protein: 7.3 g/dL (ref 6.0–8.3)

## 2012-08-22 LAB — URINALYSIS, ROUTINE W REFLEX MICROSCOPIC
Bilirubin Urine: NEGATIVE
Glucose, UA: 100 mg/dL — AB
Ketones, ur: NEGATIVE mg/dL
pH: 5.5 (ref 5.0–8.0)

## 2012-08-22 LAB — URINE MICROSCOPIC-ADD ON

## 2012-08-22 MED ORDER — CIPROFLOXACIN HCL 250 MG PO TABS: 250.0000 mg | ORAL_TABLET | Freq: Two times a day (BID) | ORAL | Status: DC

## 2012-08-22 MED ORDER — SODIUM CHLORIDE 0.9 % IV SOLN
Freq: Once | INTRAVENOUS | Status: AC
  Administered 2012-08-22: 12:00:00 via INTRAVENOUS

## 2012-08-22 MED ORDER — CIPROFLOXACIN IN D5W 400 MG/200ML IV SOLN
400.0000 mg | Freq: Once | INTRAVENOUS | Status: AC
  Administered 2012-08-22: 400 mg via INTRAVENOUS
  Filled 2012-08-22: qty 200

## 2012-08-22 NOTE — ED Provider Notes (Signed)
Patient has been seen and evaluated by Dr. Jacky Kindle, with plan as follows: " Plan again I see no reason to admit as she is nontoxic and at her baseline. We'll give a dose of IV Cipro and follow with oral. Blood sugars electrolytes hematologic picture is at her baseline. Neurologically she is at baseline. We'll follow up further as an outpatient."  Patient will be discharged after completion of IV cipro--prescription for home cipro provided by Dr. Jacky Kindle.   Jimmye Norman, NP 08/22/12 1719

## 2012-08-22 NOTE — ED Provider Notes (Signed)
History     CSN: [id number]  Arrival date & time 08/22/12  0946   First MD Initiated Contact with Patient 08/22/12 1000      Chief Complaint  Patient presents with  . Muscle Pain    (Consider location/radiation/quality/duration/timing/severity/associated sxs/prior treatment) Patient is a 71 y.o. female presenting with weakness. The history is provided by the patient.  Weakness The primary symptoms include fever. Primary symptoms do not include headaches, nausea or vomiting.  Additional symptoms include weakness.   Pt is a 71 yo female with significant history of kidney transplant, DM, and HTN who presents to ER with fever and chills.  She is on chronic immunosuppressants for her kidney transplant.  Per husband fevers began Thursday shortly after receiving a flu shot.  Pt complains of myalgias. Pt had a foley catheter after a surgical repair of her urethral diverticulosis.  Husband states that the foley has been out for a month.  Husband states that she is not ambulating as much.  She is normally ambulatory with a walker. Denies upper respiratory symptoms, chest pain, SOB, HA, rash, dysuria, pelvic pain, diarrhea, abdominal pain, nausea and vomiting. She also denies eye pain where she previously had shingles.  Past Medical History  Diagnosis Date  . Diabetes mellitus   . Diabetic neuropathy   . Hypothyroidism   . Hypertension   . GERD (gastroesophageal reflux disease)   . Heart murmur     not treated for  . Arthritis   . Chronic kidney disease     RENAL TRANSPLANT IN 2008--PT IS FOLLOWED BY DR. Karie Fetch WITH BUN OF 37 AND CREAT 1.7--PER NOTE DR. NESI FROM 04/11/12  . Urethral diverticulum   . Eyesight diminished     GLAUCOMA   . History of shingles     LEFT EYE JAN 2012--STILL HAS EYE PAIN-STATES NOT ABLE TO TAKE THE MEDICATION FOR SHINGLES BECAUSE OF HER HX OF KIDNEY TRANSPLANT  . Fingernail abnormalities     FUNGUS OF FINGERNAILS    Past Surgical History  Procedure  Date  . Kidney transplant 2008    At [facility]  . Back surgery     ruptured disk  . Abdominal hysterectomy   . Appendectomy   . Laser surgery of both eyes for hemorrhages   . 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 Urethral diverticulectomy 04/28/2012    Procedure: URETHRAL DIVERTICULECTOMY;  Surgeon: Antony Haste, MD;  Location: [facility];  Service: Urology;  Laterality: N/A;    Family History  Problem Relation Age of Onset  . Anesthesia problems Neg Hx     History  Substance Use Topics  . Smoking status: Former Smoker -- 0.5 packs/day for 20 years    Quit date: 10/21/1978  . Smokeless tobacco: Never Used  . Alcohol Use: No    OB History    Grav Para Term Preterm Abortions TAB SAB Ect Mult Living                  Review of Systems  Constitutional: Positive for fever, chills and activity change.  Eyes: Negative for pain.  Respiratory: Negative for cough and shortness of breath.   Gastrointestinal: Negative for nausea, vomiting, abdominal pain and diarrhea.  Genitourinary: Negative for dysuria and pelvic pain.  Musculoskeletal: Positive for myalgias.  Skin: Negative for rash.  Neurological: Positive for weakness. Negative for headaches.    Allergies  Adhesive; Aspirin; Hydrocodone; Iohexol; Oxycodone; Penicillins; Tacrolimus; and Contrast media  Home Medications   Current  Outpatient Rx  Name Route Sig Dispense Refill  . ALLOPURINOL 300 MG PO TABS Oral Take 150 mg by mouth daily with breakfast.     . AMLODIPINE BESYLATE 10 MG PO TABS Oral Take 10 mg by mouth daily with breakfast.     . CALCITRIOL 0.5 MCG PO CAPS Oral Take 0.5 mcg by mouth daily with breakfast.     . CALCIUM CARBONATE ANTACID 500 MG PO CHEW Oral Chew 2 tablets by mouth 3 (three) times daily with meals.    . FENTANYL 12 MCG/HR TD PT72 Transdermal Place 1 patch onto the skin every 3 (three) days.    . FENTANYL 50 MCG/HR TD PT72 Transdermal Place 1 patch onto the skin every 3 (three) days.     . FUROSEMIDE 40 MG PO TABS Oral Take 40 mg by mouth 2 (two) times daily.    Marland Kitchen GABAPENTIN 400 MG PO CAPS Oral Take 400 mg by mouth 3 (three) times daily.    . INSULIN DETEMIR 100 UNIT/ML [place] SOLN Subcutaneous Inject 28 Units into the skin daily with breakfast.     . INSULIN LISPRO (HUMAN) 100 UNIT/ML [place] SOLN Subcutaneous Inject 3-12 Units into the skin 3 (three) times daily before meals. Pt is on a sliding scale    . KETOCONAZOLE 2 % EX CREA Topical Apply 1 application topically 2 (two) times daily.    Marland Kitchen LABETALOL HCL 200 MG PO TABS Oral Take 300 mg by mouth 3 (three) times daily.     Marland Kitchen LATANOPROST 0.005 % OP SOLN Right Eye Place 1 drop into the right eye at bedtime.    Marland Kitchen LEVOTHYROXINE SODIUM 112 MCG PO TABS Oral Take 224 mcg by mouth daily with breakfast.    . MAGNESIUM OXIDE 400 MG PO TABS Oral Take 400 mg by mouth daily.     Marland Kitchen MYCOPHENOLATE MOFETIL 250 MG PO CAPS Oral Take 500 mg by mouth 2 (two) times daily.    Marland Kitchen PREDNISONE 5 MG PO TABS Oral Take 10 mg by mouth daily with breakfast.     . RANITIDINE HCL 150 MG PO TABS Oral Take 150 mg by mouth daily.    Marland Kitchen SIROLIMUS 1 MG PO TABS Oral Take 3 mg by mouth daily. Pt takes three 1 mg tablets = 3 mg daily    . UREA 40 % EX CREA Topical Apply 1 application topically 2 (two) times daily. Apply to finger nails, and cuticles.    Marland Kitchen VITAMIN D (ERGOCALCIFEROL) 50000 UNITS PO CAPS Oral Take 50,000 Units by mouth every 7 (seven) days. On Wednesdays      BP 122/62  Pulse 80  Temp 100.5 F (38.1 C) (Oral)  Resp 22  Ht 5\' 3"  (1.6 m)  Wt 150 lb (68.04 kg)  BMI 26.57 kg/m2  SpO2 96%  Physical Exam  Constitutional: She appears well-developed and well-nourished. No distress.  HENT:  Head: Normocephalic and atraumatic.  Eyes: Pupils are equal, round, and reactive to light.  Neck: Normal range of motion.  Cardiovascular: Normal rate, regular rhythm, S1 normal and S2 normal.   Murmur heard.  Systolic murmur is present with a grade of 2/6    Pulmonary/Chest: Effort normal. She has no wheezes. She has rales. She exhibits no tenderness.  Abdominal: Soft. Bowel sounds are normal. She exhibits no distension. There is no tenderness.  Musculoskeletal: Normal range of motion. She exhibits no edema.  Neurological: She displays normal reflexes.       Somnolent, easily awaken but has  to be prompted to stay awake. Oriented to person and place. Follows command when awake.   Skin: Skin is warm and dry. No rash noted.    ED Course  Procedures (including critical care time)   Labs Reviewed  CBC WITH DIFFERENTIAL  COMPREHENSIVE METABOLIC PANEL  URINALYSIS, ROUTINE W REFLEX MICROSCOPIC  CULTURE, BLOOD (ROUTINE X 2)  CULTURE, BLOOD (ROUTINE X 2)  URINE CULTURE   Results for orders placed during the hospital encounter of 08/22/12  CBC WITH DIFFERENTIAL      Component Value Range   WBC 6.1  4.0 - 10.5 K/uL   RBC 4.64  3.87 - 5.11 MIL/uL   Hemoglobin 12.2  12.0 - 15.0 g/dL   HCT 14.7  82.9 - 56.2 %   MCV 86.0  78.0 - 100.0 fL   MCH 26.3  26.0 - 34.0 pg   MCHC 30.6  30.0 - 36.0 g/dL   RDW 13.0 (*) 86.5 - 78.4 %   Platelets 141 (*) 150 - 400 K/uL   Neutrophils Relative 84 (*) 43 - 77 %   Neutro Abs 5.1  1.7 - 7.7 K/uL   Lymphocytes Relative 10 (*) 12 - 46 %   Lymphs Abs 0.6 (*) 0.7 - 4.0 K/uL   Monocytes Relative 5  3 - 12 %   Monocytes Absolute 0.3  0.1 - 1.0 K/uL   Eosinophils Relative 1  0 - 5 %   Eosinophils Absolute 0.1  0.0 - 0.7 K/uL   Basophils Relative 0  0 - 1 %   Basophils Absolute 0.0  0.0 - 0.1 K/uL  COMPREHENSIVE METABOLIC PANEL      Component Value Range   Sodium 145  135 - 145 mEq/L   Potassium 3.2 (*) 3.5 - 5.1 mEq/L   Chloride 98  96 - 112 mEq/L   CO2 38 (*) 19 - 32 mEq/L   Glucose, Bld 236 (*) 70 - 99 mg/dL   BUN 31 (*) 6 - 23 mg/dL   Creatinine, Ser 6.96 (*) 0.50 - 1.10 mg/dL   Calcium 9.3  8.4 - 29.5 mg/dL   Total Protein 7.3  6.0 - 8.3 g/dL   Albumin 3.6  3.5 - 5.2 g/dL   AST 14  0 - 37 U/L   ALT 7   0 - 35 U/L   Alkaline Phosphatase 52  39 - 117 U/L   Total Bilirubin 0.7  0.3 - 1.2 mg/dL   GFR calc non Af Amer 24 (*) >90 mL/min   GFR calc Af Amer 27 (*) >90 mL/min  URINALYSIS, ROUTINE W REFLEX MICROSCOPIC      Component Value Range   Color, Urine YELLOW  YELLOW   APPearance CLOUDY (*) CLEAR   Specific Gravity, Urine 1.012  1.005 - 1.030   pH 5.5  5.0 - 8.0   Glucose, UA 100 (*) NEGATIVE mg/dL   Hgb urine dipstick TRACE (*) NEGATIVE   Bilirubin Urine NEGATIVE  NEGATIVE   Ketones, ur NEGATIVE  NEGATIVE mg/dL   Protein, ur NEGATIVE  NEGATIVE mg/dL   Urobilinogen, UA 0.2  0.0 - 1.0 mg/dL   Nitrite NEGATIVE  NEGATIVE   Leukocytes, UA MODERATE (*) NEGATIVE  URINE MICROSCOPIC-ADD ON      Component Value Range   Squamous Epithelial / LPF RARE  RARE   WBC, UA 21-50  <3 WBC/hpf   RBC / HPF 0-2  <3 RBC/hpf   Bacteria, UA RARE  RARE   Urine-Other RARE YEAST  Dg Chest 2 View  08/22/2012  *RADIOLOGY REPORT*  Clinical Data: Shortness of breath.  muscular pain  CHEST - 2 VIEW  Comparison: 06/27/2012  Findings: There is mild cardiac enlargement.  Lung volumes are low and there is asymmetric elevation of the right hemidiaphragm. There is a right pleural effusion and pulmonary vascular congestion.  IMPRESSION:  1.  Suspect CHF. 2.  Low lung volumes with asymmetric elevation of the right hemidiaphragm.   Original Report Authenticated By: Signa Kell, M.D.    No results found.   No diagnosis found. 1. UTI 2. H/o renal transplant 3. Altered mental status    MDM  Pt presents to the ER with fever and chills.  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  She remains quiet, with low grade temperature. Lab studies show baseline status, positive UTI. Given h/o transplant and previous uti infections, as well as reported altered mental status by husband, Dr. Evlyn Kanner paged for  admission. Discussed with dr. Jacky Kindle who will see and evaluate in the ED.       Rodena Medin, PA-C 08/22/12 1349

## 2012-08-22 NOTE — ED Notes (Signed)
Family at bedside. 

## 2012-08-22 NOTE — ED Notes (Signed)
Patients husband advised that patient received flu shot Thursday morning.   Patient started having generalized pain after that.  Patient has trouble answering questions.   Patient uses walker and motorized wheelchair at home secondary to ruptured disc.

## 2012-08-22 NOTE — Consult Note (Signed)
PCP:   Julian Hy, MD   Chief Complaint:  weakness HPI: [name] is Russo patient of Dr. Evlyn Kanner presenting to the emergency room with 24 hours of weakness and questionable can confusion. I been asked to evaluate her and possibly admit her for  Russo urinary tract infection. Past history includes diabetes mellitus type 2 with renal, neurologic and vascular complications. Additionally she has Russo history of end-stage renal disease with prior renal transplant and most recently her course of been plagued by UTIs having had Russo ladder diverticulum repaired by Dr. Mena Goes in recent times. According to husband she's been really at her baseline and has not been on Cipro for her urinary tract in quite some time. She has seen Dr. Domenic Schwab this week and got Russo good checkup and has recently had Russo Russo flu vaccine this week as well. Evidently yesterday she was somewhat listless with blood sugars in the 102 100 range which are her baseline. He did not contact anyone her contact the office if she was "unwilling". This morning she was bit more confused and given the fact she was still listless he brought her to the emergency room somewhat against her will. Crux of symptomatology was Russo failure to thrive, weakness, mild mental status changes. There was no fever chills or sweats. There was no nausea vomiting abdominal pain or diarrhea. He went take Russo been reasonable until yesterday where she was off some in her intake is much better by his report today. She denies any urinary symptoms but obviously this been Russo problem in the past. She's had no cough sputum. He is had no rash. In the emergency room she was evaluated felt to have possibly urinary tract infection and I was consulted for further management   Past Medical History: Past Medical History  Diagnosis Date  . Diabetes mellitus   . Diabetic neuropathy   . Hypothyroidism   . Hypertension   . GERD (gastroesophageal reflux disease)   . Heart murmur     not treated for  .  Arthritis   . Chronic kidney disease     RENAL TRANSPLANT IN 2008--PT IS FOLLOWED BY DR. Karie Fetch WITH BUN OF 37 AND CREAT 1.7--PER NOTE DR. NESI FROM 04/11/12  . Urethral diverticulum   . Eyesight diminished     GLAUCOMA   . History of shingles     LEFT EYE JAN 2012--STILL HAS EYE PAIN-STATES NOT ABLE TO TAKE THE MEDICATION FOR SHINGLES BECAUSE OF HER HX OF KIDNEY TRANSPLANT  . Fingernail abnormalities     FUNGUS OF FINGERNAILS   Past Surgical History  Procedure Date  . Kidney transplant 2008    At [facility]  . Back surgery     ruptured disk  . Abdominal hysterectomy   . Appendectomy   . Laser surgery of both eyes for hemorrhages   . 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 Urethral diverticulectomy 04/28/2012    Procedure: URETHRAL DIVERTICULECTOMY;  Surgeon: Antony Haste, MD;  Location: [facility];  Service: Urology;  Laterality: N/Russo;    Medications: Prior to Admission medications   Medication Sig Start Date End Date Taking? Authorizing Provider  allopurinol (ZYLOPRIM) 300 MG tablet Take 150 mg by mouth daily with breakfast.    Yes Historical Provider, MD  amLODipine (NORVASC) 10 MG tablet Take 10 mg by mouth daily with breakfast.    Yes Historical Provider, MD  calcitRIOL (ROCALTROL) 0.5 MCG capsule Take 0.5 mcg by mouth daily with breakfast.  Yes Historical Provider, MD  calcium carbonate (TUMS - DOSED IN MG ELEMENTAL CALCIUM) 500 MG chewable tablet Chew 2 tablets by mouth 3 (three) times daily with meals.   Yes Historical Provider, MD  fentaNYL (DURAGESIC - DOSED MCG/HR) 12 MCG/HR Place 1 patch onto the skin every 3 (three) days. New patch was applied on Thursday   Yes Historical Provider, MD  fentaNYL (DURAGESIC - DOSED MCG/HR) 50 MCG/HR Place 1 patch onto the skin every 3 (three) days. New patch was applied on Thursday   Yes Historical Provider, MD  furosemide (LASIX) 40 MG tablet Take 40 mg by mouth 2 (two) times daily.   Yes Historical Provider, MD    gabapentin (NEURONTIN) 400 MG capsule Take 400 mg by mouth 3 (three) times daily.   Yes Historical Provider, MD  insulin detemir (LEVEMIR) 100 UNIT/ML injection Inject 28 Units into the skin daily with breakfast.    Yes Historical Provider, MD  insulin lispro (HUMALOG) 100 UNIT/ML injection Inject 3-12 Units into the skin 3 (three) times daily before meals. Pt is on Russo sliding scale   Yes Historical Provider, MD  ketoconazole (NIZORAL) 2 % cream Apply 1 application topically 2 (two) times daily.   Yes Historical Provider, MD  labetalol (NORMODYNE) 200 MG tablet Take 300 mg by mouth 3 (three) times daily.    Yes Historical Provider, MD  latanoprost (XALATAN) 0.005 % ophthalmic solution Place 1 drop into the right eye at bedtime.   Yes Historical Provider, MD  levothyroxine (SYNTHROID, LEVOTHROID) 112 MCG tablet Take 224 mcg by mouth daily with breakfast.   Yes Historical Provider, MD  magnesium oxide (MAG-OX) 400 MG tablet Take 400 mg by mouth daily.    Yes Historical Provider, MD  mycophenolate (CELLCEPT) 250 MG capsule Take 500 mg by mouth 2 (two) times daily.   Yes Historical Provider, MD  predniSONE (DELTASONE) 5 MG tablet Take 10 mg by mouth daily with breakfast.    Yes Historical Provider, MD  ranitidine (ZANTAC) 150 MG tablet Take 150 mg by mouth daily.   Yes Historical Provider, MD  sirolimus (RAPAMUNE) 1 MG tablet Take 3 mg by mouth daily. Pt takes three 1 mg tablets = 3 mg daily   Yes Historical Provider, MD  urea (CARMOL) 40 % CREA Apply 1 application topically 2 (two) times daily. Apply to finger nails, and cuticles.   Yes Historical Provider, MD    Allergies:   Allergies  Allergen Reactions  . Adhesive (Tape)     Skin irritation, "pulls the skin off"   . Aspirin     Crawling feeling under skin  . Hydrocodone Other (See Comments)    Hallucinations   . Iohexol      Desc: pt unable to tell what the reaction was   . Oxycodone     hallucinations  . Penicillins Hives and  Swelling    Has tolerated cefazolin and ceftriaxone.  . Tacrolimus Other (See Comments)    Hallucinations   . Contrast Media (Iodinated Diagnostic Agents) Hives, Swelling and Rash    Social History:  reports that she quit smoking about 33 years ago. She has never used smokeless tobacco. She reports that she does not drink alcohol or use illicit drugs.  Family History: Family History  Problem Relation Age of Onset  . Anesthesia problems Neg Hx     Physical Exam: Filed Vitals:   08/22/12 1230 08/22/12 1318 08/22/12 1330 08/22/12 1430  BP: 151/55 146/55 149/54 130/55  Pulse: 74 77  74 76  Temp:  100.2 F (37.9 C)    TempSrc:  Oral    Resp:  22    Height:      Weight:      SpO2: 100% 100% 100% 100%   General appearance: alert, cooperative and no distress Head: Normocephalic, without obvious abnormality, atraumatic Eyes: conjunctivae/corneas clear. PERRL, EOM's intact.  Nose: Nares normal. Septum midline. Mucosa normal. No drainage or sinus tenderness. Throat: lips, mucosa, and tongue normal; teeth and gums normal Neck: no adenopathy, no carotid bruit, no JVD and thyroid not enlarged, symmetric, no tenderness/mass/nodules Resp: clear to auscultation bilaterally Cardio: regular rate and rhythm, S1, S2 normal and systolic murmur: early systolic 2/6, crescendo at lower left sternal border GI: soft, non-tender; bowel sounds normal; no masses,  no organomegaly Extremities: extremities normal, atraumatic, no cyanosis or edema, she does have some subacute deformity of the right shoulder which her husband and she both status awaiting surgery sounds like is Russo chronic displacement but this is unchanged and certainly no erythema or warmth or fluctuance Pulses: 2+ and symmetric Lymph nodes: Cervical adenopathy: no cervical lymphadenopathy Neurologic: Alert and oriented X 3, normal strength and tone. Normal symmetric reflexes.     Labs on Admission:   [facility] 08/22/12 1116  NA 145  K  3.2*  CL 98  CO2 38*  GLUCOSE 236*  BUN 31*  CREATININE 2.00*  CALCIUM 9.3  MG --  PHOS --    Basename 08/22/12 1116  AST 14  ALT 7  ALKPHOS 52  BILITOT 0.7  PROT 7.3  ALBUMIN 3.6   No results found for this basename: LIPASE:2,AMYLASE:2 in the last 72 hours  Basename 08/22/12 1116  WBC 6.1  NEUTROABS 5.1  HGB 12.2  HCT 39.9  MCV 86.0  PLT 141*   No results found for this basename: CKTOTAL:3,CKMB:3,CKMBINDEX:3,TROPONINI:3 in the last 72 hours No results found for this basename: TSH,T4TOTAL,FREET3,T3FREE,THYROIDAB in the last 72 hours No results found for this basename: VITAMINB12:2,FOLATE:2,FERRITIN:2,TIBC:2,IRON:2,RETICCTPCT:2 in the last 72 hours  Radiological Exams on Admission: Dg Chest 2 View  08/22/2012  *RADIOLOGY REPORT*  Clinical Data: Shortness of breath.  muscular pain  CHEST - 2 VIEW  Comparison: 06/27/2012  Findings: There is mild cardiac enlargement.  Lung volumes are low and there is asymmetric elevation of the right hemidiaphragm. There is Russo right pleural effusion and pulmonary vascular congestion.  IMPRESSION:  1.  Suspect CHF. 2.  Low lung volumes with asymmetric elevation of the right hemidiaphragm.   Original Report Authenticated By: Signa Kell, M.D.    Orders placed during the hospital encounter of 06/27/12  . EKG 12-LEAD  . EKG 12-LEAD    Assessment/Plan #1 probable urinary tract infection certainly not turbid toxic tissues cognating and just had Russo wonderful lunch no vomiting so we will treat her with one dose of Cipro and further orally I will call his to [facility] in: [place] and stress the need to get it filled today and started tonight she will have one dose IV  #2 altered mental status reported currently clear and at baseline with good intake may be related to #1  #3 diabetes mellitus type 2 with renal, neurologic and vascular complications at her baseline no evidence of hyperosmolar state her DKA  #4 chronic kidney disease status post  renal transplant creatinine at her baseline at 2.0  #5chronic back pain on Duragesic patch  #6 primary hypothyroidism  Plan again I see no reason to admit as she is nontoxic and at  her baseline. We'll give Russo dose of IV Cipro and follow with oral. Blood sugars electrolytes hematologic picture is at her baseline. Neurologically she is at baseline. We'll follow up further as an outpatient.   [name] 08/22/2012, 3:05 PM

## 2012-08-22 NOTE — ED Notes (Signed)
Report given and care transferred to [facility], [place].

## 2012-08-22 NOTE — ED Notes (Signed)
MD at bedside. 

## 2012-08-22 NOTE — ED Provider Notes (Signed)
Medical screening examination/treatment/procedure(s) were conducted as a shared visit with non-physician practitioner(s) and myself.  I personally evaluated the patient during the encounter   Loren Racer, MD 08/22/12 1505

## 2012-08-22 NOTE — ED Notes (Signed)
According to patient's husband patient is at her baseline. She is A/O x4.

## 2012-08-23 LAB — URINE CULTURE

## 2012-08-23 NOTE — ED Provider Notes (Signed)
Medical screening examination/treatment/procedure(s) were performed by non-physician practitioner and as supervising physician I was immediately available for consultation/collaboration.   Glynn Octave, MD 08/23/12 0020

## 2012-08-27 ENCOUNTER — Encounter (HOSPITAL_COMMUNITY): Payer: Medicare Other

## 2012-08-28 ENCOUNTER — Encounter (HOSPITAL_COMMUNITY)
Admission: RE | Admit: 2012-08-28 | Discharge: 2012-08-28 | Disposition: A | Discharge status: Home / Self Care | Payer: Medicare Other | Source: Ambulatory Visit | Attending: Nephrology | Admitting: Nephrology

## 2012-08-28 DIAGNOSIS — E119 Type 2 diabetes mellitus without complications: Secondary | ICD-10-CM | POA: Insufficient documentation

## 2012-08-28 DIAGNOSIS — N2581 Secondary hyperparathyroidism of renal origin: Secondary | ICD-10-CM | POA: Insufficient documentation

## 2012-08-28 DIAGNOSIS — N183 Chronic kidney disease, stage 3 unspecified: Secondary | ICD-10-CM | POA: Insufficient documentation

## 2012-08-28 DIAGNOSIS — D638 Anemia in other chronic diseases classified elsewhere: Secondary | ICD-10-CM | POA: Insufficient documentation

## 2012-08-28 DIAGNOSIS — R6889 Other general symptoms and signs: Secondary | ICD-10-CM | POA: Insufficient documentation

## 2012-08-28 LAB — URIC ACID: Uric Acid, Serum: 5.5 mg/dL (ref 2.4–7.0)

## 2012-08-28 LAB — CULTURE, BLOOD (ROUTINE X 2): Culture: NO GROWTH

## 2012-08-28 LAB — POCT HEMOGLOBIN-HEMACUE: Hemoglobin: 12.1 g/dL (ref 12.0–15.0)

## 2012-08-28 MED ORDER — EPOETIN ALFA 10000 UNIT/ML IJ SOLN: 5000.0000 [IU] | INTRAMUSCULAR | Status: DC

## 2012-08-29 LAB — CULTURE, BLOOD (ROUTINE X 2)

## 2012-08-29 LAB — HEMOGLOBIN A1C
Hgb A1c MFr Bld: 8.5 % — ABNORMAL HIGH (ref <5.7)
Mean Plasma Glucose: 197 mg/dL — ABNORMAL HIGH (ref <117)

## 2012-09-11 ENCOUNTER — Encounter (HOSPITAL_COMMUNITY)
Admission: RE | Admit: 2012-09-11 | Discharge: 2012-09-11 | Disposition: A | Discharge status: Home / Self Care | Payer: Medicare Other | Source: Ambulatory Visit | Attending: Nephrology | Admitting: Nephrology

## 2012-09-11 LAB — URINE MICROSCOPIC-ADD ON

## 2012-09-11 LAB — IRON AND TIBC
Iron: 34 ug/dL — ABNORMAL LOW (ref 42–135)
UIBC: 180 ug/dL (ref 125–400)

## 2012-09-11 LAB — COMPREHENSIVE METABOLIC PANEL
BUN: 26 mg/dL — ABNORMAL HIGH (ref 6–23)
CO2: 33 mEq/L — ABNORMAL HIGH (ref 19–32)
Chloride: 94 mEq/L — ABNORMAL LOW (ref 96–112)
Creatinine, Ser: 1.51 mg/dL — ABNORMAL HIGH (ref 0.50–1.10)
GFR calc Af Amer: 38 mL/min — ABNORMAL LOW (ref >90)
GFR calc non Af Amer: 33 mL/min — ABNORMAL LOW (ref >90)
Glucose, Bld: 173 mg/dL — ABNORMAL HIGH (ref 70–99)
Total Bilirubin: 0.7 mg/dL (ref 0.3–1.2)

## 2012-09-11 LAB — PHOSPHORUS: Phosphorus: 3.8 mg/dL (ref 2.3–4.6)

## 2012-09-11 LAB — URINALYSIS, ROUTINE W REFLEX MICROSCOPIC
Bilirubin Urine: NEGATIVE
Ketones, ur: NEGATIVE mg/dL
Nitrite: NEGATIVE
Urobilinogen, UA: 0.2 mg/dL (ref 0.0–1.0)

## 2012-09-11 LAB — PROTEIN / CREATININE RATIO, URINE: Protein Creatinine Ratio: 0.5 — ABNORMAL HIGH (ref 0.00–0.15)

## 2012-09-11 MED ORDER — EPOETIN ALFA 10000 UNIT/ML IJ SOLN: 5000.0000 [IU] | INTRAMUSCULAR | Status: DC

## 2012-09-15 LAB — SIROLIMUS LEVEL: Sirolimus (Rapamycin): 7.8

## 2012-09-24 ENCOUNTER — Other Ambulatory Visit (HOSPITAL_COMMUNITY): Payer: Self-pay | Admitting: *Deleted

## 2012-09-25 ENCOUNTER — Encounter (HOSPITAL_COMMUNITY)
Admission: RE | Admit: 2012-09-25 | Discharge: 2012-09-25 | Disposition: A | Discharge status: Home / Self Care | Payer: Medicare Other | Source: Ambulatory Visit | Attending: Nephrology | Admitting: Nephrology

## 2012-09-25 DIAGNOSIS — R6889 Other general symptoms and signs: Secondary | ICD-10-CM | POA: Insufficient documentation

## 2012-09-25 DIAGNOSIS — E119 Type 2 diabetes mellitus without complications: Secondary | ICD-10-CM | POA: Insufficient documentation

## 2012-09-25 DIAGNOSIS — N183 Chronic kidney disease, stage 3 unspecified: Secondary | ICD-10-CM | POA: Insufficient documentation

## 2012-09-25 DIAGNOSIS — D638 Anemia in other chronic diseases classified elsewhere: Secondary | ICD-10-CM | POA: Insufficient documentation

## 2012-09-25 DIAGNOSIS — N2581 Secondary hyperparathyroidism of renal origin: Secondary | ICD-10-CM | POA: Insufficient documentation

## 2012-09-25 LAB — RENAL FUNCTION PANEL
Albumin: 3.7 g/dL (ref 3.5–5.2)
BUN: 32 mg/dL — ABNORMAL HIGH (ref 6–23)
Chloride: 95 mEq/L — ABNORMAL LOW (ref 96–112)
Creatinine, Ser: 2.03 mg/dL — ABNORMAL HIGH (ref 0.50–1.10)
GFR calc non Af Amer: 23 mL/min — ABNORMAL LOW (ref >90)
Phosphorus: 3.8 mg/dL (ref 2.3–4.6)
Potassium: 3.3 mEq/L — ABNORMAL LOW (ref 3.5–5.1)

## 2012-09-25 LAB — POCT HEMOGLOBIN-HEMACUE: Hemoglobin: 11.4 g/dL — ABNORMAL LOW (ref 12.0–15.0)

## 2012-09-25 MED ORDER — EPOETIN ALFA 10000 UNIT/ML IJ SOLN
INTRAMUSCULAR | Status: AC
  Administered 2012-09-25: 5000 [IU] via SUBCUTANEOUS
  Filled 2012-09-25: qty 1

## 2012-09-25 MED ORDER — EPOETIN ALFA 10000 UNIT/ML IJ SOLN
5000.0000 [IU] | INTRAMUSCULAR | Status: DC
  Administered 2012-09-25: 5000 [IU] via SUBCUTANEOUS

## 2012-10-23 ENCOUNTER — Encounter (HOSPITAL_COMMUNITY)
Admission: RE | Admit: 2012-10-23 | Discharge: 2012-10-23 | Disposition: A | Discharge status: Home / Self Care | Payer: Medicare Other | Source: Ambulatory Visit | Attending: Nephrology | Admitting: Nephrology

## 2012-10-23 DIAGNOSIS — D638 Anemia in other chronic diseases classified elsewhere: Secondary | ICD-10-CM | POA: Insufficient documentation

## 2012-10-23 DIAGNOSIS — N183 Chronic kidney disease, stage 3 unspecified: Secondary | ICD-10-CM | POA: Insufficient documentation

## 2012-10-23 DIAGNOSIS — E119 Type 2 diabetes mellitus without complications: Secondary | ICD-10-CM | POA: Insufficient documentation

## 2012-10-23 DIAGNOSIS — R6889 Other general symptoms and signs: Secondary | ICD-10-CM | POA: Insufficient documentation

## 2012-10-23 DIAGNOSIS — N2581 Secondary hyperparathyroidism of renal origin: Secondary | ICD-10-CM | POA: Insufficient documentation

## 2012-10-23 LAB — COMPREHENSIVE METABOLIC PANEL
AST: 13 U/L (ref 0–37)
Albumin: 4 g/dL (ref 3.5–5.2)
Alkaline Phosphatase: 60 U/L (ref 39–117)
CO2: 30 mEq/L (ref 19–32)
Chloride: 98 mEq/L (ref 96–112)
GFR calc non Af Amer: 25 mL/min — ABNORMAL LOW (ref >90)
Potassium: 3.7 mEq/L (ref 3.5–5.1)
Total Bilirubin: 0.6 mg/dL (ref 0.3–1.2)

## 2012-10-23 LAB — URINE MICROSCOPIC-ADD ON

## 2012-10-23 LAB — URINALYSIS, ROUTINE W REFLEX MICROSCOPIC
Bilirubin Urine: NEGATIVE
Glucose, UA: 500 mg/dL — AB
Specific Gravity, Urine: 1.025 (ref 1.005–1.030)
pH: 5.5 (ref 5.0–8.0)

## 2012-10-23 LAB — PROTEIN / CREATININE RATIO, URINE
Protein Creatinine Ratio: 0.36 — ABNORMAL HIGH (ref 0.00–0.15)
Total Protein, Urine: 34.5 mg/dL

## 2012-10-23 LAB — IRON AND TIBC: UIBC: 161 ug/dL (ref 125–400)

## 2012-10-23 LAB — POCT HEMOGLOBIN-HEMACUE: Hemoglobin: 12 g/dL (ref 12.0–15.0)

## 2012-10-23 LAB — FERRITIN: Ferritin: 872 ng/mL — ABNORMAL HIGH (ref 10–291)

## 2012-10-23 MED ORDER — EPOETIN ALFA 10000 UNIT/ML IJ SOLN: 5000.0000 [IU] | INTRAMUSCULAR | Status: DC

## 2012-10-24 LAB — URINE CULTURE

## 2012-11-06 ENCOUNTER — Encounter (HOSPITAL_COMMUNITY): Payer: Medicare Other

## 2012-11-13 ENCOUNTER — Encounter (HOSPITAL_COMMUNITY)
Admission: RE | Admit: 2012-11-13 | Discharge: 2012-11-13 | Disposition: A | Discharge status: Home / Self Care | Payer: Medicare Other | Source: Ambulatory Visit | Attending: Nephrology | Admitting: Nephrology

## 2012-11-13 LAB — POCT HEMOGLOBIN-HEMACUE: Hemoglobin: 11.8 g/dL — ABNORMAL LOW (ref 12.0–15.0)

## 2012-11-13 MED ORDER — EPOETIN ALFA 10000 UNIT/ML IJ SOLN
INTRAMUSCULAR | Status: AC
  Administered 2012-11-13: 5000 [IU] via SUBCUTANEOUS
  Filled 2012-11-13: qty 1

## 2012-11-13 MED ORDER — EPOETIN ALFA 10000 UNIT/ML IJ SOLN: 5000.0000 [IU] | INTRAMUSCULAR | Status: DC

## 2012-12-11 ENCOUNTER — Encounter (HOSPITAL_COMMUNITY)
Admission: RE | Admit: 2012-12-11 | Discharge: 2012-12-11 | Disposition: A | Discharge status: Home / Self Care | Payer: Medicare Other | Source: Ambulatory Visit | Attending: Nephrology | Admitting: Nephrology

## 2012-12-11 DIAGNOSIS — N183 Chronic kidney disease, stage 3 unspecified: Secondary | ICD-10-CM | POA: Insufficient documentation

## 2012-12-11 DIAGNOSIS — N2581 Secondary hyperparathyroidism of renal origin: Secondary | ICD-10-CM | POA: Insufficient documentation

## 2012-12-11 DIAGNOSIS — E119 Type 2 diabetes mellitus without complications: Secondary | ICD-10-CM | POA: Insufficient documentation

## 2012-12-11 DIAGNOSIS — D638 Anemia in other chronic diseases classified elsewhere: Secondary | ICD-10-CM | POA: Insufficient documentation

## 2012-12-11 DIAGNOSIS — R6889 Other general symptoms and signs: Secondary | ICD-10-CM | POA: Insufficient documentation

## 2012-12-11 LAB — PROTEIN / CREATININE RATIO, URINE
Creatinine, Urine: 82.39 mg/dL
Total Protein, Urine: 19.2 mg/dL

## 2012-12-11 LAB — COMPREHENSIVE METABOLIC PANEL
ALT: 9 U/L (ref 0–35)
AST: 18 U/L (ref 0–37)
Albumin: 3.8 g/dL (ref 3.5–5.2)
Alkaline Phosphatase: 55 U/L (ref 39–117)
BUN: 35 mg/dL — ABNORMAL HIGH (ref 6–23)
Chloride: 97 mEq/L (ref 96–112)
Potassium: 3 mEq/L — ABNORMAL LOW (ref 3.5–5.1)
Sodium: 145 mEq/L (ref 135–145)
Total Bilirubin: 0.6 mg/dL (ref 0.3–1.2)
Total Protein: 7.2 g/dL (ref 6.0–8.3)

## 2012-12-11 LAB — URINALYSIS, ROUTINE W REFLEX MICROSCOPIC
Bilirubin Urine: NEGATIVE
Glucose, UA: 100 mg/dL — AB
Hgb urine dipstick: NEGATIVE
Specific Gravity, Urine: 1.013 (ref 1.005–1.030)
Urobilinogen, UA: 0.2 mg/dL (ref 0.0–1.0)
pH: 5.5 (ref 5.0–8.0)

## 2012-12-11 LAB — HEMOGLOBIN A1C
Hgb A1c MFr Bld: 9 % — ABNORMAL HIGH (ref <5.7)
Mean Plasma Glucose: 212 mg/dL — ABNORMAL HIGH (ref <117)

## 2012-12-11 LAB — IRON AND TIBC
Saturation Ratios: 25 % (ref 20–55)
UIBC: 183 ug/dL (ref 125–400)

## 2012-12-11 LAB — PHOSPHORUS: Phosphorus: 4.1 mg/dL (ref 2.3–4.6)

## 2012-12-11 LAB — URINE MICROSCOPIC-ADD ON

## 2012-12-11 LAB — POCT HEMOGLOBIN-HEMACUE: Hemoglobin: 13.1 g/dL (ref 12.0–15.0)

## 2012-12-11 LAB — TSH: TSH: 2.366 u[IU]/mL (ref 0.350–4.500)

## 2012-12-11 MED ORDER — EPOETIN ALFA 10000 UNIT/ML IJ SOLN: 5000.0000 [IU] | INTRAMUSCULAR | Status: DC

## 2012-12-13 LAB — URINE CULTURE: Colony Count: >=100000

## 2012-12-25 ENCOUNTER — Inpatient Hospital Stay (HOSPITAL_COMMUNITY): Admission: RE | Admit: 2012-12-25 | Payer: Medicare Other | Source: Ambulatory Visit

## 2012-12-29 ENCOUNTER — Encounter (HOSPITAL_COMMUNITY)
Admission: RE | Admit: 2012-12-29 | Discharge: 2012-12-29 | Disposition: A | Discharge status: Home / Self Care | Payer: Medicare Other | Source: Ambulatory Visit | Attending: Nephrology | Admitting: Nephrology

## 2012-12-29 DIAGNOSIS — D638 Anemia in other chronic diseases classified elsewhere: Secondary | ICD-10-CM | POA: Insufficient documentation

## 2012-12-29 DIAGNOSIS — N183 Chronic kidney disease, stage 3 unspecified: Secondary | ICD-10-CM | POA: Insufficient documentation

## 2012-12-29 DIAGNOSIS — E119 Type 2 diabetes mellitus without complications: Secondary | ICD-10-CM | POA: Insufficient documentation

## 2012-12-29 DIAGNOSIS — N2581 Secondary hyperparathyroidism of renal origin: Secondary | ICD-10-CM | POA: Insufficient documentation

## 2012-12-29 DIAGNOSIS — R6889 Other general symptoms and signs: Secondary | ICD-10-CM | POA: Insufficient documentation

## 2012-12-29 MED ORDER — EPOETIN ALFA 10000 UNIT/ML IJ SOLN: 5000.0000 [IU] | INTRAMUSCULAR | Status: DC

## 2013-01-03 ENCOUNTER — Emergency Department (HOSPITAL_COMMUNITY)
Admission: EM | Admit: 2013-01-03 | Discharge: 2013-01-04 | Disposition: A | Discharge status: Home / Self Care | Payer: Medicare Other | Source: Home / Self Care | Attending: Emergency Medicine | Admitting: Emergency Medicine

## 2013-01-03 DIAGNOSIS — R4182 Altered mental status, unspecified: Secondary | ICD-10-CM

## 2013-01-03 DIAGNOSIS — N390 Urinary tract infection, site not specified: Secondary | ICD-10-CM

## 2013-01-03 DIAGNOSIS — N289 Disorder of kidney and ureter, unspecified: Secondary | ICD-10-CM

## 2013-01-03 MED ORDER — SODIUM CHLORIDE 0.9 % IV BOLUS (SEPSIS)
500.0000 mL | Freq: Once | INTRAVENOUS | Status: AC
  Administered 2013-01-04: 500 mL via INTRAVENOUS

## 2013-01-03 NOTE — ED Notes (Signed)
Bed:WA12<BR> Expected date:<BR> Expected time:<BR> Means of arrival:<BR> Comments:<BR> EMS

## 2013-01-03 NOTE — ED Notes (Signed)
AS per EMS, pt husband sts pt has been feeling weak for a couple of days. Pt had a fentanyl patch on shoulder. Pupils are pin point. Pt spouse sts she has the same type of behavior when she develops a UTI.

## 2013-01-04 ENCOUNTER — Encounter (HOSPITAL_COMMUNITY): Payer: Self-pay | Admitting: Emergency Medicine

## 2013-01-04 ENCOUNTER — Emergency Department (HOSPITAL_COMMUNITY): Payer: Medicare Other

## 2013-01-04 ENCOUNTER — Inpatient Hospital Stay (HOSPITAL_COMMUNITY)
Admission: EM | Admit: 2013-01-04 | Discharge: 2013-01-08 | DRG: 689 | Disposition: A | Discharge status: Skilled Nursing Facility | Payer: Medicare Other | Attending: Endocrinology | Admitting: Endocrinology

## 2013-01-04 DIAGNOSIS — H409 Unspecified glaucoma: Secondary | ICD-10-CM | POA: Diagnosis present

## 2013-01-04 DIAGNOSIS — Z87891 Personal history of nicotine dependence: Secondary | ICD-10-CM

## 2013-01-04 DIAGNOSIS — K219 Gastro-esophageal reflux disease without esophagitis: Secondary | ICD-10-CM | POA: Diagnosis present

## 2013-01-04 DIAGNOSIS — I10 Essential (primary) hypertension: Secondary | ICD-10-CM | POA: Diagnosis present

## 2013-01-04 DIAGNOSIS — IMO0002 Reserved for concepts with insufficient information to code with codable children: Secondary | ICD-10-CM | POA: Diagnosis present

## 2013-01-04 DIAGNOSIS — Z940 Kidney transplant status: Secondary | ICD-10-CM

## 2013-01-04 DIAGNOSIS — B952 Enterococcus as the cause of diseases classified elsewhere: Secondary | ICD-10-CM | POA: Diagnosis present

## 2013-01-04 DIAGNOSIS — E1142 Type 2 diabetes mellitus with diabetic polyneuropathy: Secondary | ICD-10-CM | POA: Diagnosis present

## 2013-01-04 DIAGNOSIS — G9341 Metabolic encephalopathy: Secondary | ICD-10-CM | POA: Diagnosis present

## 2013-01-04 DIAGNOSIS — N390 Urinary tract infection, site not specified: Principal | ICD-10-CM | POA: Diagnosis present

## 2013-01-04 DIAGNOSIS — Z794 Long term (current) use of insulin: Secondary | ICD-10-CM

## 2013-01-04 DIAGNOSIS — R531 Weakness: Secondary | ICD-10-CM

## 2013-01-04 DIAGNOSIS — M129 Arthropathy, unspecified: Secondary | ICD-10-CM | POA: Diagnosis present

## 2013-01-04 DIAGNOSIS — N179 Acute kidney failure, unspecified: Secondary | ICD-10-CM

## 2013-01-04 DIAGNOSIS — N183 Chronic kidney disease, stage 3 unspecified: Secondary | ICD-10-CM | POA: Diagnosis present

## 2013-01-04 DIAGNOSIS — R5381 Other malaise: Secondary | ICD-10-CM | POA: Diagnosis present

## 2013-01-04 DIAGNOSIS — N2581 Secondary hyperparathyroidism of renal origin: Secondary | ICD-10-CM | POA: Diagnosis present

## 2013-01-04 DIAGNOSIS — I129 Hypertensive chronic kidney disease with stage 1 through stage 4 chronic kidney disease, or unspecified chronic kidney disease: Secondary | ICD-10-CM | POA: Diagnosis present

## 2013-01-04 DIAGNOSIS — E1149 Type 2 diabetes mellitus with other diabetic neurological complication: Secondary | ICD-10-CM | POA: Diagnosis present

## 2013-01-04 DIAGNOSIS — Z79899 Other long term (current) drug therapy: Secondary | ICD-10-CM

## 2013-01-04 DIAGNOSIS — E1165 Type 2 diabetes mellitus with hyperglycemia: Secondary | ICD-10-CM | POA: Diagnosis present

## 2013-01-04 DIAGNOSIS — E039 Hypothyroidism, unspecified: Secondary | ICD-10-CM | POA: Diagnosis present

## 2013-01-04 DIAGNOSIS — E860 Dehydration: Secondary | ICD-10-CM

## 2013-01-04 DIAGNOSIS — E876 Hypokalemia: Secondary | ICD-10-CM | POA: Diagnosis present

## 2013-01-04 LAB — CBC WITH DIFFERENTIAL/PLATELET
Basophils Absolute: 0 10*3/uL (ref 0.0–0.1)
Basophils Relative: 0 % (ref 0–1)
Eosinophils Absolute: 0.1 10*3/uL (ref 0.0–0.7)
Eosinophils Relative: 1 % (ref 0–5)
HCT: 38.1 % (ref 36.0–46.0)
Hemoglobin: 12.2 g/dL (ref 12.0–15.0)
Lymphocytes Relative: 14 % (ref 12–46)
Lymphs Abs: 1.2 K/uL (ref 0.7–4.0)
MCH: 27.5 pg (ref 26.0–34.0)
MCHC: 32 g/dL (ref 30.0–36.0)
MCV: 85.8 fL (ref 78.0–100.0)
Monocytes Absolute: 0.5 10*3/uL (ref 0.1–1.0)
Monocytes Relative: 6 % (ref 3–12)
Neutro Abs: 6.9 K/uL (ref 1.7–7.7)
Neutrophils Relative %: 79 % — ABNORMAL HIGH (ref 43–77)
Platelets: 196 10*3/uL (ref 150–400)
RBC: 4.44 MIL/uL (ref 3.87–5.11)
RDW: 18.1 % — ABNORMAL HIGH (ref 11.5–15.5)
WBC: 8.7 K/uL (ref 4.0–10.5)

## 2013-01-04 LAB — CBC
Hemoglobin: 12 g/dL (ref 12.0–15.0)
MCH: 26.7 pg (ref 26.0–34.0)
MCV: 86.6 fL (ref 78.0–100.0)
RBC: 4.49 MIL/uL (ref 3.87–5.11)

## 2013-01-04 LAB — URINE MICROSCOPIC-ADD ON

## 2013-01-04 LAB — BASIC METABOLIC PANEL
Calcium: 9.5 mg/dL (ref 8.4–10.5)
GFR calc Af Amer: 22 mL/min — ABNORMAL LOW (ref >90)
GFR calc non Af Amer: 19 mL/min — ABNORMAL LOW (ref >90)
Potassium: 3.3 mEq/L — ABNORMAL LOW (ref 3.5–5.1)
Sodium: 142 mEq/L (ref 135–145)

## 2013-01-04 LAB — BASIC METABOLIC PANEL WITH GFR
BUN: 36 mg/dL — ABNORMAL HIGH (ref 6–23)
CO2: 36 meq/L — ABNORMAL HIGH (ref 19–32)
Chloride: 97 meq/L (ref 96–112)
Creatinine, Ser: 2.38 mg/dL — ABNORMAL HIGH (ref 0.50–1.10)
Glucose, Bld: 115 mg/dL — ABNORMAL HIGH (ref 70–99)

## 2013-01-04 LAB — URINALYSIS, ROUTINE W REFLEX MICROSCOPIC
Bilirubin Urine: NEGATIVE
Bilirubin Urine: NEGATIVE
Glucose, UA: NEGATIVE mg/dL
Ketones, ur: NEGATIVE mg/dL
Nitrite: NEGATIVE
Nitrite: NEGATIVE
Protein, ur: 30 mg/dL — AB
Specific Gravity, Urine: 1.013 (ref 1.005–1.030)
Specific Gravity, Urine: 1.016 (ref 1.005–1.030)
Urobilinogen, UA: 0.2 mg/dL (ref 0.0–1.0)
Urobilinogen, UA: 0.2 mg/dL (ref 0.0–1.0)
pH: 5 (ref 5.0–8.0)

## 2013-01-04 LAB — COMPREHENSIVE METABOLIC PANEL
BUN: 37 mg/dL — ABNORMAL HIGH (ref 6–23)
CO2: 38 mEq/L — ABNORMAL HIGH (ref 19–32)
Calcium: 9.4 mg/dL (ref 8.4–10.5)
Creatinine, Ser: 2.21 mg/dL — ABNORMAL HIGH (ref 0.50–1.10)
GFR calc Af Amer: 24 mL/min — ABNORMAL LOW (ref >90)
GFR calc non Af Amer: 21 mL/min — ABNORMAL LOW (ref >90)
Glucose, Bld: 62 mg/dL — ABNORMAL LOW (ref 70–99)

## 2013-01-04 LAB — GLUCOSE, CAPILLARY
Glucose-Capillary: 87 mg/dL (ref 70–99)
Glucose-Capillary: 63 mg/dL — ABNORMAL LOW (ref 70–99)

## 2013-01-04 MED ORDER — PREDNISONE 10 MG PO TABS
10.0000 mg | ORAL_TABLET | Freq: Every day | ORAL | Status: DC
  Administered 2013-01-05 – 2013-01-08 (×4): 10 mg via ORAL
  Filled 2013-01-04 (×5): qty 1

## 2013-01-04 MED ORDER — LABETALOL HCL 300 MG PO TABS
300.0000 mg | ORAL_TABLET | Freq: Three times a day (TID) | ORAL | Status: DC
  Administered 2013-01-04 – 2013-01-08 (×12): 300 mg via ORAL
  Filled 2013-01-04 (×13): qty 1

## 2013-01-04 MED ORDER — VANCOMYCIN HCL IN DEXTROSE 1-5 GM/200ML-% IV SOLN
1000.0000 mg | Freq: Once | INTRAVENOUS | Status: AC
  Administered 2013-01-05: 1000 mg via INTRAVENOUS
  Filled 2013-01-04 (×2): qty 200

## 2013-01-04 MED ORDER — POTASSIUM CHLORIDE IN NACL 20-0.9 MEQ/L-% IV SOLN
INTRAVENOUS | Status: DC
  Administered 2013-01-05 – 2013-01-08 (×5): via INTRAVENOUS
  Filled 2013-01-04 (×8): qty 1000

## 2013-01-04 MED ORDER — KETOCONAZOLE 2 % EX CREA
1.0000 "application " | TOPICAL_CREAM | Freq: Two times a day (BID) | CUTANEOUS | Status: DC
  Administered 2013-01-04 – 2013-01-08 (×8): 1 via TOPICAL
  Filled 2013-01-04: qty 15

## 2013-01-04 MED ORDER — POTASSIUM CHLORIDE CRYS ER 20 MEQ PO TBCR
20.0000 meq | EXTENDED_RELEASE_TABLET | Freq: Two times a day (BID) | ORAL | Status: DC
  Administered 2013-01-04 – 2013-01-05 (×3): 20 meq via ORAL
  Filled 2013-01-04 (×5): qty 1

## 2013-01-04 MED ORDER — ACETAMINOPHEN 650 MG RE SUPP: 650.0000 mg | Freq: Four times a day (QID) | RECTAL | Status: DC | PRN

## 2013-01-04 MED ORDER — CALCIUM CARBONATE ANTACID 500 MG PO CHEW
2.0000 | CHEWABLE_TABLET | Freq: Three times a day (TID) | ORAL | Status: DC
  Administered 2013-01-05 – 2013-01-08 (×11): 400 mg via ORAL
  Filled 2013-01-04 (×13): qty 2

## 2013-01-04 MED ORDER — ACETAMINOPHEN 325 MG PO TABS: 650.0000 mg | ORAL_TABLET | Freq: Four times a day (QID) | ORAL | Status: DC | PRN

## 2013-01-04 MED ORDER — SIROLIMUS 1 MG PO TABS
3.0000 mg | ORAL_TABLET | Freq: Every day | ORAL | Status: DC
  Administered 2013-01-05 – 2013-01-08 (×4): 3 mg via ORAL
  Filled 2013-01-04 (×5): qty 3

## 2013-01-04 MED ORDER — FENTANYL 25 MCG/HR TD PT72
50.0000 ug | MEDICATED_PATCH | TRANSDERMAL | Status: DC
  Administered 2013-01-04 – 2013-01-07 (×2): 50 ug via TRANSDERMAL
  Filled 2013-01-04: qty 2
  Filled 2013-01-04 (×2): qty 1

## 2013-01-04 MED ORDER — CIPROFLOXACIN IN D5W 400 MG/200ML IV SOLN
400.0000 mg | Freq: Once | INTRAVENOUS | Status: AC
  Administered 2013-01-04: 400 mg via INTRAVENOUS
  Filled 2013-01-04: qty 200

## 2013-01-04 MED ORDER — LATANOPROST 0.005 % OP SOLN
1.0000 [drp] | Freq: Every day | OPHTHALMIC | Status: DC
  Administered 2013-01-04 – 2013-01-07 (×4): 1 [drp] via OPHTHALMIC
  Filled 2013-01-04 (×2): qty 2.5

## 2013-01-04 MED ORDER — INSULIN ASPART 100 UNIT/ML ~~LOC~~ SOLN
0.0000 [IU] | Freq: Three times a day (TID) | SUBCUTANEOUS | Status: DC
  Administered 2013-01-05: 1 [IU] via SUBCUTANEOUS
  Administered 2013-01-05: 5 [IU] via SUBCUTANEOUS
  Administered 2013-01-05 – 2013-01-06 (×2): 9 [IU] via SUBCUTANEOUS

## 2013-01-04 MED ORDER — METHYLPREDNISOLONE SODIUM SUCC 40 MG IJ SOLR
40.0000 mg | Freq: Two times a day (BID) | INTRAMUSCULAR | Status: DC
  Administered 2013-01-04 – 2013-01-06 (×4): 40 mg via INTRAVENOUS
  Filled 2013-01-04 (×6): qty 1

## 2013-01-04 MED ORDER — CALCITRIOL 0.5 MCG PO CAPS
0.5000 ug | ORAL_CAPSULE | Freq: Every day | ORAL | Status: DC
  Administered 2013-01-05 – 2013-01-08 (×4): 0.5 ug via ORAL
  Filled 2013-01-04 (×5): qty 1

## 2013-01-04 MED ORDER — DEXTROSE 50 % IV SOLN
1.0000 | Freq: Once | INTRAVENOUS | Status: AC
  Administered 2013-01-04: 50 mL via INTRAVENOUS
  Filled 2013-01-04: qty 50

## 2013-01-04 MED ORDER — FENTANYL 12 MCG/HR TD PT72
12.5000 ug | MEDICATED_PATCH | TRANSDERMAL | Status: DC
  Administered 2013-01-04 – 2013-01-07 (×2): 12.5 ug via TRANSDERMAL
  Filled 2013-01-04 (×2): qty 1

## 2013-01-04 MED ORDER — AMLODIPINE BESYLATE 10 MG PO TABS
10.0000 mg | ORAL_TABLET | Freq: Every day | ORAL | Status: DC
  Administered 2013-01-05 – 2013-01-08 (×4): 10 mg via ORAL
  Filled 2013-01-04 (×5): qty 1

## 2013-01-04 MED ORDER — CEFTRIAXONE SODIUM 1 G IJ SOLR
1.0000 g | INTRAMUSCULAR | Status: DC
  Administered 2013-01-05 – 2013-01-06 (×3): 1 g via INTRAVENOUS
  Filled 2013-01-04 (×5): qty 10

## 2013-01-04 MED ORDER — LEVOTHYROXINE SODIUM 112 MCG PO TABS
224.0000 ug | ORAL_TABLET | Freq: Every day | ORAL | Status: DC
  Administered 2013-01-05 – 2013-01-08 (×4): 224 ug via ORAL
  Filled 2013-01-04 (×5): qty 2

## 2013-01-04 MED ORDER — FAMOTIDINE 20 MG PO TABS
20.0000 mg | ORAL_TABLET | Freq: Every day | ORAL | Status: DC
  Administered 2013-01-05 – 2013-01-08 (×4): 20 mg via ORAL
  Filled 2013-01-04 (×5): qty 1

## 2013-01-04 MED ORDER — CIPROFLOXACIN HCL 500 MG PO TABS: 500.0000 mg | ORAL_TABLET | Freq: Two times a day (BID) | ORAL | Status: DC

## 2013-01-04 MED ORDER — INSULIN DETEMIR 100 UNIT/ML ~~LOC~~ SOLN
10.0000 [IU] | Freq: Every day | SUBCUTANEOUS | Status: DC
  Administered 2013-01-05: 10 [IU] via SUBCUTANEOUS
  Filled 2013-01-04: qty 0.1
  Filled 2013-01-04: qty 10

## 2013-01-04 MED ORDER — HEPARIN SODIUM (PORCINE) 5000 UNIT/ML IJ SOLN
5000.0000 [IU] | Freq: Three times a day (TID) | INTRAMUSCULAR | Status: DC
  Administered 2013-01-04 – 2013-01-08 (×11): 5000 [IU] via SUBCUTANEOUS
  Filled 2013-01-04 (×14): qty 1

## 2013-01-04 MED ORDER — MYCOPHENOLATE MOFETIL 250 MG PO CAPS
500.0000 mg | ORAL_CAPSULE | Freq: Two times a day (BID) | ORAL | Status: DC
  Administered 2013-01-04 – 2013-01-08 (×8): 500 mg via ORAL
  Filled 2013-01-04 (×9): qty 2

## 2013-01-04 MED ORDER — SODIUM CHLORIDE 0.9 % IV BOLUS (SEPSIS)
500.0000 mL | Freq: Once | INTRAVENOUS | Status: AC
  Administered 2013-01-04: 500 mL via INTRAVENOUS

## 2013-01-04 MED ORDER — ALLOPURINOL 150 MG HALF TABLET
150.0000 mg | ORAL_TABLET | Freq: Every day | ORAL | Status: DC
  Administered 2013-01-05 – 2013-01-08 (×4): 150 mg via ORAL
  Filled 2013-01-04 (×5): qty 1

## 2013-01-04 MED ORDER — GABAPENTIN 300 MG PO CAPS
300.0000 mg | ORAL_CAPSULE | Freq: Three times a day (TID) | ORAL | Status: DC
  Administered 2013-01-04: 300 mg via ORAL
  Filled 2013-01-04 (×4): qty 1

## 2013-01-04 NOTE — ED Provider Notes (Signed)
History     CSN: [id number]  Arrival date & time 01/04/13  1442   First MD Initiated Contact with Patient 01/04/13 1714      Chief Complaint  Patient presents with  . Urinary Tract Infection    (Consider location/radiation/quality/duration/timing/severity/associated sxs/prior treatment) HPI Comments: Pt with generalized weakness, fatigue, inability to walk on her own using walker at home for past few days, very similar to prior episodes of UTI.  She was seen in the ED yesterday, told UTI and given a dose of IV abx, adn given a Rx for home.  Pt left the ED early this AM.  Pt and family slept some, this morning, spouse got up and she couldn't get out of bed actually soiled the bed due to not being able to get up.  Normally he reports he could care for her but he had a back procedure in the recent past.  She denies pain, dysuria, HA, neck pain, rash, N/V.  Pt has not yet had second dose of abx.  EMS called and brought here.  Pt had an appt with nephrologist today, she is a renal transplant patient.  Did not make her appt today due to weakness.    The history is provided by the patient, the spouse and medical records.    Past Medical History  Diagnosis Date  . Diabetes mellitus   . Diabetic neuropathy   . Hypothyroidism   . Hypertension   . GERD (gastroesophageal reflux disease)   . Heart murmur     not treated for  . Arthritis   . Chronic kidney disease     RENAL TRANSPLANT IN 2008--PT IS FOLLOWED BY DR. Karie Fetch WITH BUN OF 37 AND CREAT 1.7--PER NOTE DR. NESI FROM 04/11/12  . Urethral diverticulum   . Eyesight diminished     GLAUCOMA   . History of shingles     LEFT EYE JAN 2012--STILL HAS EYE PAIN-STATES NOT ABLE TO TAKE THE MEDICATION FOR SHINGLES BECAUSE OF HER HX OF KIDNEY TRANSPLANT  . Fingernail abnormalities     FUNGUS OF FINGERNAILS    Past Surgical History  Procedure Laterality Date  . Kidney transplant  2008    At [facility]  . Back surgery       ruptured disk  . Abdominal hysterectomy    . Appendectomy    . Laser surgery of both eyes for hemorrhages    . 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 Urethral diverticulectomy  04/28/2012    Procedure: URETHRAL DIVERTICULECTOMY;  Surgeon: Antony Haste, MD;  Location: [facility];  Service: Urology;  Laterality: N/A;    Family History  Problem Relation Age of Onset  . Anesthesia problems Neg Hx     History  Substance Use Topics  . Smoking status: Former Smoker -- 0.50 packs/day for 20 years    Quit date: 10/21/1978  . Smokeless tobacco: Never Used  . Alcohol Use: No    OB History   Grav Para Term Preterm Abortions TAB SAB Ect Mult Living                  Review of Systems  Constitutional: Positive for fatigue.  HENT: Negative for neck stiffness.   Respiratory: Negative for cough and shortness of breath.   Gastrointestinal: Negative for nausea, vomiting, abdominal pain and diarrhea.  Genitourinary: Negative for decreased urine volume and difficulty urinating.  Neurological: Positive for weakness. Negative for headaches.  All other systems reviewed and are negative.  Allergies  Adhesive; Aspirin; Hydrocodone; Iohexol; Oxycodone; Penicillins; Tacrolimus; and Contrast media  Home Medications   No current outpatient prescriptions on file.  BP 151/55  Pulse 80  Temp(Src) 99.5 F (37.5 C) (Oral)  Resp 16  Ht 5\' 4"  (1.626 m)  Wt 139 lb 8.8 oz (63.3 kg)  BMI 23.94 kg/m2  SpO2 95%  Physical Exam  Nursing note and vitals reviewed. Constitutional: She is oriented to person, place, and time. She appears well-developed and well-nourished.  HENT:  Head: Normocephalic and atraumatic.  Eyes: EOM are normal. No scleral icterus.  Neck: Normal range of motion. Neck supple.  Cardiovascular: Normal rate.   Pulmonary/Chest: Effort normal. No respiratory distress. She has no wheezes. She has no rales.  Abdominal: Soft. She exhibits no distension. There is no tenderness.   Neurological: She is alert and oriented to person, place, and time. She exhibits normal muscle tone. Coordination normal.  Skin: Skin is warm and dry. No rash noted.    ED Course  Procedures (including critical care time)  Labs Reviewed  CBC WITH DIFFERENTIAL - Abnormal; Notable for the following:    RDW 18.1 (*)    Neutrophils Relative 79 (*)    All other components within normal limits  BASIC METABOLIC PANEL - Abnormal; Notable for the following:    Potassium 3.3 (*)    CO2 36 (*)    Glucose, Bld 115 (*)    BUN 36 (*)    Creatinine, Ser 2.38 (*)    GFR calc non Af Amer 19 (*)    GFR calc Af Amer 22 (*)    All other components within normal limits  URINALYSIS, ROUTINE W REFLEX MICROSCOPIC - Abnormal; Notable for the following:    APPearance TURBID (*)    Hgb urine dipstick LARGE (*)    Protein, ur 30 (*)    Leukocytes, UA LARGE (*)    All other components within normal limits  URINE MICROSCOPIC-ADD ON - Abnormal; Notable for the following:    Squamous Epithelial / LPF MANY (*)    Bacteria, UA MANY (*)    Casts GRANULAR CAST (*)    All other components within normal limits  GLUCOSE, CAPILLARY - Abnormal; Notable for the following:    Glucose-Capillary 63 (*)    All other components within normal limits  URINE CULTURE  URINE CULTURE  GLUCOSE, CAPILLARY  BASIC METABOLIC PANEL  CBC   Dg Chest 2 View  01/04/2013  *RADIOLOGY REPORT*  Clinical Data: Fatigue.  Generalized weakness.  Shortness of breath.  CHEST - 2 VIEW  Comparison: Two-view chest x-ray 08/22/2012, 06/27/2012, 04/30/2012.  Findings: Cardiac silhouette moderately enlarged but stable. Pulmonary vascularity normal without evidence of pulmonary edema. Stable chronic elevation of the right hemidiaphragm.  No confluent airspace consolidation.  No pleural effusions.  Degenerative changes involving the thoracic spine and the right shoulder joint.  IMPRESSION: Stable cardiomegaly.  No acute cardiopulmonary disease.    Original Report Authenticated By: Hulan Saas, M.D.      1. Renal failure (ARF), acute on chronic   2. Urinary tract infection     ra sat is 98% and I interpret to be normal   5:51 PM I spoke to Case management who informs me that home health can only come to home twice a week.  Will need admission given weakness, UTI, renal insuff.  Pt is a renal transplant patient and relatively immunocompromised.     6:18 PM Pt's Cr is now more elevated to 2.38 up  from 2.21 yesterday which was already above baseline of 1.9. UA is pending.  Cr clearance is 22 and pt received dose of Cipro around 1 AM early this AM, so will hold off on any further dosing for now.  Pt is orthostatic as well.  Pt received IVF last night and again here.  Despite this, her systolic drops by 20 and HR up by 20 between laying and standing.    MDM  Pt with generalized weakness, not able to get out of bed, spouse wasn't able to lift pt or help her ambulate at home.  No home health care or other help.  Pt with generalized weakness c/w prior UTI episodes.  UA yesterday shows mild evidence of UTI.  Cultures are neg thus far, still in process.            Gavin Pound. Oletta Lamas, MD 01/05/13 [phone]

## 2013-01-04 NOTE — ED Notes (Signed)
Pts husband states pt can't get to bathroom by herself; pts husband states since she got the UTI pt has been more confused, unable to walk, and states pt has not been able to get her sentences out; pt alert; pt c/o back pain and pain all over; pt denies difficulty breathing.

## 2013-01-04 NOTE — ED Provider Notes (Signed)
History     CSN: [id number]  Arrival date & time 01/03/13  2325   First MD Initiated Contact with Patient 01/03/13 2328      Chief Complaint  Patient presents with  . Fatigue    (Consider location/radiation/quality/duration/timing/severity/associated sxs/prior treatment) HPI A LEVEL 5 CAVEAT PERTAINS DUE TO ALTERED MENTAL STATUS Pt presenting with generalized weakness and mild confusion over the past 2 days.  Husband states this is her similar symptoms when she has urinary tract infections.  He states she also urinated on herself tonight which prompted him to bring her to the ED.  No fever, no vomiting.  Has continued to drink liquids normally.  No focal weakness.    Past Medical History  Diagnosis Date  . Diabetes mellitus   . Diabetic neuropathy   . Hypothyroidism   . Hypertension   . GERD (gastroesophageal reflux disease)   . Heart murmur     not treated for  . Arthritis   . Chronic kidney disease     RENAL TRANSPLANT IN 2008--PT IS FOLLOWED BY DR. Karie Fetch WITH BUN OF 37 AND CREAT 1.7--PER NOTE DR. NESI FROM 04/11/12  . Urethral diverticulum   . Eyesight diminished     GLAUCOMA   . History of shingles     LEFT EYE JAN 2012--STILL HAS EYE PAIN-STATES NOT ABLE TO TAKE THE MEDICATION FOR SHINGLES BECAUSE OF HER HX OF KIDNEY TRANSPLANT  . Fingernail abnormalities     FUNGUS OF FINGERNAILS    Past Surgical History  Procedure Laterality Date  . Kidney transplant  2008    At [facility]  . Back surgery      ruptured disk  . Abdominal hysterectomy    . Appendectomy    . Laser surgery of both eyes for hemorrhages    . 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 Urethral diverticulectomy  04/28/2012    Procedure: URETHRAL DIVERTICULECTOMY;  Surgeon: Antony Haste, MD;  Location: [facility];  Service: Urology;  Laterality: N/A;    Family History  Problem Relation Age of Onset  . Anesthesia problems Neg Hx     History  Substance Use Topics  . Smoking  status: Former Smoker -- 0.50 packs/day for 20 years    Quit date: 10/21/1978  . Smokeless tobacco: Never Used  . Alcohol Use: No    OB History   Grav Para Term Preterm Abortions TAB SAB Ect Mult Living                  Review of Systems UNABLE TO OBTAIN ROS DUE TO LEVEL 5 CAVEAT   Allergies  Adhesive; Aspirin; Hydrocodone; Iohexol; Oxycodone; Penicillins; Tacrolimus; and Contrast media  Home Medications   Current Outpatient Rx  Name  Route  Sig  Dispense  Refill  . allopurinol (ZYLOPRIM) 300 MG tablet   Oral   Take 150 mg by mouth daily with breakfast.          . amLODipine (NORVASC) 10 MG tablet   Oral   Take 10 mg by mouth daily with breakfast.          . calcitRIOL (ROCALTROL) 0.5 MCG capsule   Oral   Take 0.5 mcg by mouth daily with breakfast.          . calcium carbonate (TUMS - DOSED IN MG ELEMENTAL CALCIUM) 500 MG chewable tablet   Oral   Chew 2 tablets by mouth 3 (three) times daily with meals.         Marland Kitchen  fentaNYL (DURAGESIC - DOSED MCG/HR) 12 MCG/HR   Transdermal   Place 1 patch onto the skin every 3 (three) days. New patch was applied on Thursday         . fentaNYL (DURAGESIC - DOSED MCG/HR) 50 MCG/HR   Transdermal   Place 1 patch onto the skin every 3 (three) days. New patch was applied on Thursday         . furosemide (LASIX) 40 MG tablet   Oral   Take 40 mg by mouth 2 (two) times daily.         Marland Kitchen gabapentin (NEURONTIN) 300 MG capsule   Oral   Take 300 mg by mouth 3 (three) times daily.         . insulin detemir (LEVEMIR) 100 UNIT/ML injection   Subcutaneous   Inject 28 Units into the skin daily with breakfast.          . insulin lispro (HUMALOG) 100 UNIT/ML injection   Subcutaneous   Inject 3-12 Units into the skin 3 (three) times daily before meals. Pt is on a sliding scale         . ketoconazole (NIZORAL) 2 % cream   Topical   Apply 1 application topically 2 (two) times daily.         Marland Kitchen labetalol (NORMODYNE) 200 MG  tablet   Oral   Take 300 mg by mouth 3 (three) times daily. Takes 1 and 1/2 tabs         . latanoprost (XALATAN) 0.005 % ophthalmic solution   Right Eye   Place 1 drop into the right eye at bedtime.         Marland Kitchen levothyroxine (SYNTHROID, LEVOTHROID) 112 MCG tablet   Oral   Take 224 mcg by mouth daily with breakfast.         . mycophenolate (CELLCEPT) 250 MG capsule   Oral   Take 500 mg by mouth 2 (two) times daily.         . potassium chloride SA (K-DUR,KLOR-CON) 20 MEQ tablet   Oral   Take 20 mEq by mouth 2 (two) times daily.         . ranitidine (ZANTAC) 150 MG tablet   Oral   Take 150 mg by mouth daily.         . sirolimus (RAPAMUNE) 1 MG tablet   Oral   Take 3 mg by mouth daily. Pt takes three 1 mg tablets = 3 mg daily         . urea (CARMOL) 40 % CREA   Topical   Apply 1 application topically 2 (two) times daily. Apply to finger nails, and cuticles.         . ciprofloxacin (CIPRO) 500 MG tablet   Oral   Take 1 tablet (500 mg total) by mouth 2 (two) times daily.   14 tablet   0   . predniSONE (DELTASONE) 5 MG tablet   Oral   Take 10 mg by mouth daily with breakfast.            BP 134/50  Pulse 65  Temp(Src) 98.9 F (37.2 C) (Oral)  Resp 12  SpO2 100% Vitals reviewed Physical Exam Physical Examination: General appearance - alert, well appearing, and in no distress Mental status - alert, oriented to person, place, not to time Eyes - pupils equal and reactive, approx 1mm, no conjunctival injection, no scleral icterus Mouth - mucous membranes moist, pharynx normal without lesions Chest - clear  to auscultation, no wheezes, rales or rhonchi, symmetric air entry Heart - normal rate, regular rhythm, normal S1, S2, no murmurs, rubs, clicks or gallops Abdomen - soft, nontender, nondistended, no masses or organomegaly, nabs Neurological - alert, oriented, normal speech, cranial nerves 2-12 intact, strength 5/5 in extremities x 4, sensation  intact Extremities - peripheral pulses normal, no pedal edema, no clubbing or cyanosis Skin - normal coloration and turgor, no rashes  ED Course  Procedures (including critical care time)  Labs Reviewed  CBC - Abnormal; Notable for the following:    RDW 18.1 (*)    All other components within normal limits  COMPREHENSIVE METABOLIC PANEL - Abnormal; Notable for the following:    Potassium 3.2 (*)    CO2 38 (*)    Glucose, Bld 62 (*)    BUN 37 (*)    Creatinine, Ser 2.21 (*)    GFR calc non Af Amer 21 (*)    GFR calc Af Amer 24 (*)    All other components within normal limits  URINALYSIS, ROUTINE W REFLEX MICROSCOPIC - Abnormal; Notable for the following:    APPearance CLOUDY (*)    Glucose, UA 250 (*)    Leukocytes, UA SMALL (*)    All other components within normal limits  URINE CULTURE  CULTURE, BLOOD (ROUTINE X 2)  CULTURE, BLOOD (ROUTINE X 2)  URINE MICROSCOPIC-ADD ON  GLUCOSE, CAPILLARY   Dg Chest 2 View  01/04/2013  *RADIOLOGY REPORT*  Clinical Data: Fatigue.  Generalized weakness.  Shortness of breath.  CHEST - 2 VIEW  Comparison: Two-view chest x-ray 08/22/2012, 06/27/2012, 04/30/2012.  Findings: Cardiac silhouette moderately enlarged but stable. Pulmonary vascularity normal without evidence of pulmonary edema. Stable chronic elevation of the right hemidiaphragm.  No confluent airspace consolidation.  No pleural effusions.  Degenerative changes involving the thoracic spine and the right shoulder joint.  IMPRESSION: Stable cardiomegaly.  No acute cardiopulmonary disease.   Original Report Authenticated By: Hulan Saas, M.D.      1. Urinary tract infection   2. Renal insufficiency   3. Altered mental status       MDM  Pt presenting with c/o altered mental status.  Urine is c/w likely UTI, urine and blood cultures sent as patient is immunosuppressed due to renal transplant.  Creatinine is near recent baseline.  Per chart review, pt has been treated on an outpatient  basis for her UTIs in the past- no significant change in her labs or chronic illnesses.  Pt has appointment scheduled tomorrow at 4pm with her doctor, per husband.  Encouraged to attend this appointment for  Are[facility].  Discharged with strict return precautions.  Pt agreeable with plan.        Ethelda Chick, MD 01/04/13 [phone]

## 2013-01-04 NOTE — ED Notes (Signed)
Pt c/o pain in back; pt states she is feeling better after finishing fluids; pt alert and mentating appropriately; NAD noted at this time

## 2013-01-04 NOTE — H&P (Signed)
[name] is an 72 y.o. female.   PCP:   Julian Hy, MD   Chief Complaint:  AMS and UTI  HPI: 74 F With MMP on Immunosuppression post Renal Transplant presented to Med attention yesterday in [facility] ED and was Rxed with a dose of IV abx, and given a Rx for home for a recurrent UTI.  She presents back today with generalized weakness, fatigue, inability to walk on her own using walker at home. She couldn't get out of bed this am and soiled the bed due to not being able to get up. She denies pain, dysuria, HA, neck pain, rash, N/V. EMS called and brought here. Pt had an appt with nephrologist today, she is a renal transplant patient. Did not make her appt today due to weakness. Pt has not yet had second dose of abx of Cipro 500 mg PO BID. U Cx is P.  I was called for inpt admit.  Her Cr is 2.38 and up from baseline.    Past Medical History:  Past Medical History  Diagnosis Date  . Diabetes mellitus   . Diabetic neuropathy   . Hypothyroidism   . Hypertension   . GERD (gastroesophageal reflux disease)   . Heart murmur     not treated for  . Arthritis   . Chronic kidney disease     RENAL TRANSPLANT IN 2008--PT IS FOLLOWED BY DR. Karie Fetch WITH BUN OF 37 AND CREAT 1.7--PER NOTE DR. NESI FROM 04/11/12  . Urethral diverticulum   . Eyesight diminished     GLAUCOMA   . History of shingles     LEFT EYE JAN 2012--STILL HAS EYE PAIN-STATES NOT ABLE TO TAKE THE MEDICATION FOR SHINGLES BECAUSE OF HER HX OF KIDNEY TRANSPLANT  . Fingernail abnormalities     FUNGUS OF FINGERNAILS    Past Surgical History  Procedure Laterality Date  . Kidney transplant  2008    At [facility]  . Back surgery      ruptured disk  . Abdominal hysterectomy    . Appendectomy    . Laser surgery of both eyes for hemorrhages    . 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 Urethral diverticulectomy  04/28/2012    Procedure: URETHRAL DIVERTICULECTOMY;  Surgeon: Antony Haste, MD;  Location: [facility];   Service: Urology;  Laterality: N/A;      Allergies:   Allergies  Allergen Reactions  . Adhesive (Tape)     Skin irritation, "pulls the skin off"   . Aspirin     Crawling feeling under skin  . Hydrocodone Other (See Comments)    Hallucinations   . Iohexol      Desc: pt unable to tell what the reaction was   . Oxycodone     hallucinations  . Penicillins Hives and Swelling    Has tolerated cefazolin and ceftriaxone.  . Tacrolimus Other (See Comments)    Hallucinations   . Contrast Media (Iodinated Diagnostic Agents) Hives, Swelling and Rash     Medications: Prior to Admission medications   Medication Sig Start Date End Date Taking? Authorizing Provider  allopurinol (ZYLOPRIM) 300 MG tablet Take 150 mg by mouth daily with breakfast.    Yes Historical Provider, MD  amLODipine (NORVASC) 10 MG tablet Take 10 mg by mouth daily with breakfast.    Yes Historical Provider, MD  calcitRIOL (ROCALTROL) 0.5 MCG capsule Take 0.5 mcg by mouth daily with breakfast.    Yes Historical Provider, MD  calcium carbonate (TUMS - DOSED IN MG ELEMENTAL CALCIUM) 500 MG chewable tablet Chew 2 tablets by mouth 3 (three) times daily with meals.   Yes Historical Provider, MD  fentaNYL (DURAGESIC - DOSED MCG/HR) 12 MCG/HR Place 1 patch onto the skin every 3 (three) days. New patch was applied on Thursday   Yes Historical Provider, MD  fentaNYL (DURAGESIC - DOSED MCG/HR) 50 MCG/HR Place 1 patch onto the skin every 3 (three) days. New patch was applied on Thursday   Yes Historical Provider, MD  furosemide (LASIX) 40 MG tablet Take 40 mg by mouth 2 (two) times daily.   Yes Historical Provider, MD  gabapentin (NEURONTIN) 300 MG capsule Take 300 mg by mouth 3 (three) times daily.   Yes Historical Provider, MD  insulin detemir (LEVEMIR) 100 UNIT/ML injection Inject 28 Units into the skin daily with breakfast.    Yes Historical Provider, MD  insulin lispro (HUMALOG) 100 UNIT/ML injection Inject 3-12 Units into the  skin 3 (three) times daily before meals. Pt is on a sliding scale   Yes Historical Provider, MD  ketoconazole (NIZORAL) 2 % cream Apply 1 application topically 2 (two) times daily.   Yes Historical Provider, MD  labetalol (NORMODYNE) 200 MG tablet Take 300 mg by mouth 3 (three) times daily. Takes 1 and 1/2 tabs   Yes Historical Provider, MD  latanoprost (XALATAN) 0.005 % ophthalmic solution Place 1 drop into the right eye at bedtime.   Yes Historical Provider, MD  levothyroxine (SYNTHROID, LEVOTHROID) 112 MCG tablet Take 224 mcg by mouth daily with breakfast.   Yes Historical Provider, MD  mycophenolate (CELLCEPT) 250 MG capsule Take 500 mg by mouth 2 (two) times daily.   Yes Historical Provider, MD  potassium chloride SA (K-DUR,KLOR-CON) 20 MEQ tablet Take 20 mEq by mouth 2 (two) times daily.   Yes Historical Provider, MD  predniSONE (DELTASONE) 5 MG tablet Take 10 mg by mouth daily with breakfast.    Yes Historical Provider, MD  ranitidine (ZANTAC) 150 MG tablet Take 150 mg by mouth daily.   Yes Historical Provider, MD  sirolimus (RAPAMUNE) 1 MG tablet Take 3 mg by mouth daily. Pt takes three 1 mg tablets = 3 mg daily   Yes Historical Provider, MD  urea (CARMOL) 40 % CREA Apply 1 application topically 2 (two) times daily. Apply to finger nails, and cuticles.   Yes Historical Provider, MD  ciprofloxacin (CIPRO) 500 MG tablet Take 1 tablet (500 mg total) by mouth 2 (two) times daily. 01/04/13   Ethelda Chick, MD      (Not in a hospital admission)   Social History:  reports that she quit smoking about 34 years ago. She has never used smokeless tobacco. She reports that she does not drink alcohol or use illicit drugs.  Family History: Family History  Problem Relation Age of Onset  . Anesthesia problems Neg Hx     Review of Systems:  Review of Systems - Weakness, FTT, Cold/Shivering/Dehydration, Hungry. No CP or SOB See HPI. Uses Walker at home.   Physical Exam:  Blood pressure  110/44, pulse 95, temperature 98.8 F (37.1 C), temperature source Oral, resp. rate 18, SpO2 98.00%. Filed Vitals:   01/04/13 1712 01/04/13 1807 01/04/13 1808 01/04/13 1809  BP: 142/59 132/55 111/69 110/44  Pulse: 75 77 80 95  Temp:      TempSrc:      Resp: 18     SpO2: 98%      General appearance: Looks  older and weak. Eyes: conjunctivae/corneas clear. PERRL, EOM's intact.  OP - Dry. Edentulous. Nose: Nares normal. Septum midline. Mucosa normal. No drainage or sinus tenderness. Neck: no adenopathy, no carotid bruit, no JVD and thyroid not enlarged, symmetric, no tenderness/mass/nodules Resp: Reg c m. Cardio: Clear GI: soft, non-tender; bowel sounds normal; no masses,  no organomegaly Extremities: extremities normal, atraumatic, no cyanosis or edema Pulses: 2+ and symmetric Lymph nodes: no cervical lymphadenopathy Neurologic: Alert and oriented. Generalized weakness.  Nothing focal.    Labs on Admission:   Recent Labs  01/03/13 2343 01/04/13 1657  NA 144 142  K 3.2* 3.3*  CL 98 97  CO2 38* 36*  GLUCOSE 62* 115*  BUN 37* 36*  CREATININE 2.21* 2.38*  CALCIUM 9.4 9.5    Recent Labs  01/03/13 2343  AST 17  ALT 8  ALKPHOS 53  BILITOT 0.6  PROT 7.1  ALBUMIN 3.5   No results found for this basename: LIPASE, AMYLASE,  in the last 72 hours  Recent Labs  01/03/13 2343 01/04/13 1657  WBC 6.1 8.7  NEUTROABS  --  6.9  HGB 12.0 12.2  HCT 38.9 38.1  MCV 86.6 85.8  PLT 214 196   No results found for this basename: CKTOTAL, CKMB, CKMBINDEX, TROPONINI,  in the last 72 hours Lab Results  Component Value Date   INR 1.0 02/19/2009   INR 0.9 04/02/2007     LAB RESULT POCT:  Results for orders placed during the hospital encounter of 01/04/13  CBC WITH DIFFERENTIAL      Result Value Range   WBC 8.7  4.0 - 10.5 K/uL   RBC 4.44  3.87 - 5.11 MIL/uL   Hemoglobin 12.2  12.0 - 15.0 g/dL   HCT 16.1  09.6 - 04.5 %   MCV 85.8  78.0 - 100.0 fL   MCH 27.5  26.0 - 34.0 pg    MCHC 32.0  30.0 - 36.0 g/dL   RDW 40.9 (*) 81.1 - 91.4 %   Platelets 196  150 - 400 K/uL   Neutrophils Relative 79 (*) 43 - 77 %   Lymphocytes Relative 14  12 - 46 %   Monocytes Relative 6  3 - 12 %   Eosinophils Relative 1  0 - 5 %   Basophils Relative 0  0 - 1 %   Neutro Abs 6.9  1.7 - 7.7 K/uL   Lymphs Abs 1.2  0.7 - 4.0 K/uL   Monocytes Absolute 0.5  0.1 - 1.0 K/uL   Eosinophils Absolute 0.1  0.0 - 0.7 K/uL   Basophils Absolute 0.0  0.0 - 0.1 K/uL   Smear Review MORPHOLOGY UNREMARKABLE    BASIC METABOLIC PANEL      Result Value Range   Sodium 142  135 - 145 mEq/L   Potassium 3.3 (*) 3.5 - 5.1 mEq/L   Chloride 97  96 - 112 mEq/L   CO2 36 (*) 19 - 32 mEq/L   Glucose, Bld 115 (*) 70 - 99 mg/dL   BUN 36 (*) 6 - 23 mg/dL   Creatinine, Ser 7.82 (*) 0.50 - 1.10 mg/dL   Calcium 9.5  8.4 - 95.6 mg/dL   GFR calc non Af Amer 19 (*) >90 mL/min   GFR calc Af Amer 22 (*) >90 mL/min  URINALYSIS, ROUTINE W REFLEX MICROSCOPIC      Result Value Range   Color, Urine YELLOW  YELLOW   APPearance TURBID (*) CLEAR   Specific Gravity, Urine  1.016  1.005 - 1.030   pH 5.0  5.0 - 8.0   Glucose, UA NEGATIVE  NEGATIVE mg/dL   Hgb urine dipstick LARGE (*) NEGATIVE   Bilirubin Urine NEGATIVE  NEGATIVE   Ketones, ur NEGATIVE  NEGATIVE mg/dL   Protein, ur 30 (*) NEGATIVE mg/dL   Urobilinogen, UA 0.2  0.0 - 1.0 mg/dL   Nitrite NEGATIVE  NEGATIVE   Leukocytes, UA LARGE (*) NEGATIVE  URINE MICROSCOPIC-ADD ON      Result Value Range   Squamous Epithelial / LPF MANY (*) RARE   WBC, UA TOO NUMEROUS TO COUNT  <3 WBC/hpf   RBC / HPF 11-20  <3 RBC/hpf   Bacteria, UA MANY (*) RARE   Casts GRANULAR CAST (*) NEGATIVE      Radiological Exams on Admission: Dg Chest 2 View  01/04/2013  *RADIOLOGY REPORT*  Clinical Data: Fatigue.  Generalized weakness.  Shortness of breath.  CHEST - 2 VIEW  Comparison: Two-view chest x-ray 08/22/2012, 06/27/2012, 04/30/2012.  Findings: Cardiac silhouette moderately  enlarged but stable. Pulmonary vascularity normal without evidence of pulmonary edema. Stable chronic elevation of the right hemidiaphragm.  No confluent airspace consolidation.  No pleural effusions.  Degenerative changes involving the thoracic spine and the right shoulder joint.  IMPRESSION: Stable cardiomegaly.  No acute cardiopulmonary disease.   Original Report Authenticated By: Hulan Saas, M.D.       Orders placed during the hospital encounter of 06/27/12  . EKG 12-LEAD  . EKG 12-LEAD     Assessment/Plan Principal Problem:   UTI (lower urinary tract infection) Active Problems:   Hypertension   Chronic kidney disease, stage III (moderate)   Diabetes mellitus type 2, uncontrolled   Dehydration   Weakness  1. 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 Currently 2.38 and up from 2.21 yesterday. Her DM and immunosuppression is keeping her susceptible to recurrent UTIs.  Admit for IVF and IV Abx = Rocephin (she has tolerated in past) and Vanco (times one). Cx P.  2. AMS/Metabolic Encephalopathy- watch with her underlying Narcotics. Currently pain controlled and MS is ok - she is joking with me. Will leave home meds alone and follow.  3. Diabetes mellitus type 2, uncontrolled- Sugars erratic with current illness.Continue Levemir and SSI. Currently Hypoglycemia at @60 - Food and bump Pred. 4. Hypertension- continue home bp medications. Just hold Lasix. 5. Chronic kidney disease, stage III (moderate) in pt with H/O Kidney transplant - continue anti-rejection drugs. Cr up with dehydration. Gentle hydrate/Hold Lasix. Follow labs. Immunosuppressants. Renal consult per Dr Evlyn Kanner in am. 6 PT/OT/CW for Adult FTT/Weakness and deconditioning. 7 DVT Proph ordered.  8  K3.3 - replete. Follow labs.  9. On chronic Prednisone - bump  the dose for stress dose and it will help with Hypoglycemia.Gwen Pounds 01/04/2013, 6:57 PM

## 2013-01-04 NOTE — ED Notes (Signed)
Per EMS: pt from home recently discharged from [facility] today; pt here due to not being able to walk due to generalized weakness and her husband is unable to care for her; pt did not get meds filled or take any of them

## 2013-01-05 LAB — BASIC METABOLIC PANEL
CO2: 32 mEq/L (ref 19–32)
Chloride: 100 mEq/L (ref 96–112)
GFR calc Af Amer: 24 mL/min — ABNORMAL LOW (ref >90)
Potassium: 3.8 mEq/L (ref 3.5–5.1)

## 2013-01-05 LAB — CBC
HCT: 38.4 % (ref 36.0–46.0)
Hemoglobin: 12.3 g/dL (ref 12.0–15.0)
MCV: 83.5 fL (ref 78.0–100.0)
WBC: 6.7 10*3/uL (ref 4.0–10.5)

## 2013-01-05 LAB — GLUCOSE, CAPILLARY
Glucose-Capillary: 88 mg/dL (ref 70–99)
Glucose-Capillary: 149 mg/dL — ABNORMAL HIGH (ref 70–99)
Glucose-Capillary: 262 mg/dL — ABNORMAL HIGH (ref 70–99)

## 2013-01-05 LAB — TSH: TSH: 0.511 u[IU]/mL (ref 0.350–4.500)

## 2013-01-05 MED ORDER — INSULIN ASPART 100 UNIT/ML ~~LOC~~ SOLN
5.0000 [IU] | Freq: Once | SUBCUTANEOUS | Status: AC
  Administered 2013-01-05: 5 [IU] via SUBCUTANEOUS

## 2013-01-05 MED ORDER — GABAPENTIN 300 MG PO CAPS
300.0000 mg | ORAL_CAPSULE | Freq: Every day | ORAL | Status: DC
  Administered 2013-01-05 – 2013-01-08 (×4): 300 mg via ORAL
  Filled 2013-01-05 (×4): qty 1

## 2013-01-05 NOTE — Evaluation (Signed)
Physical Therapy Evaluation Patient Details Name: [name] MRN: [id number] DOB: 1941/01/12 Today's Date: 01/05/2013 Time: 0865-7846 PT Time Calculation (min): 10 min  PT Assessment / Plan / Recommendation Clinical Impression  Pt adm with AMS and UTI.  Needs skilled PT to maximize I and safety so pt can return home with supportive husband.  Already has equipment at home.    PT Assessment  Patient needs continued PT services    Follow Up Recommendations  Home health PT;Supervision/Assistance - 24 hour    Does the patient have the potential to tolerate intense rehabilitation      Barriers to Discharge        Equipment Recommendations  None recommended by PT    Recommendations for Other Services     Frequency Min 3X/week    Precautions / Restrictions Precautions Precautions: Fall Restrictions Weight Bearing Restrictions: No   Pertinent Vitals/Pain N/A      Mobility  Bed Mobility Bed Mobility: Supine to Sit Supine to Sit: 2: Max assist;HOB elevated Details for Bed Mobility Assistance: Pt requires max assistance for transition from supine to sitting EOB including scooting forward. Transfers Sit to Stand: 4: Min assist;With upper extremity assist;From chair/3-in-1 Stand to Sit: 3: Mod assist;To chair/3-in-1;Without upper extremity assist Details for Transfer Assistance: Assist to lift hips and to slow descent to chair. Ambulation/Gait Ambulation/Gait Assistance: 4: Min assist;3: Mod assist Ambulation Distance (Feet): 7 Feet Assistive device: Rolling walker Ambulation/Gait Assistance Details: verbal cues to step up into walker. Gait Pattern: Step-through pattern;Decreased step length - left;Decreased stance time - right;Trunk flexed Gait velocity: decr General Gait Details: Initially pt requiring min A but fatigued quickly and required mod A and someone to bring chair up behind.    Exercises     PT Diagnosis: Difficulty walking;Generalized weakness  PT Problem  List: Decreased strength;Decreased activity tolerance;Decreased balance;Decreased mobility PT Treatment Interventions: DME instruction;Gait training;Functional mobility training;Therapeutic activities;Therapeutic exercise;Balance training;Patient/family education   PT Goals Acute Rehab PT Goals PT Goal Formulation: With patient Time For Goal Achievement: 01/12/13 Potential to Achieve Goals: Good Pt will go Supine/Side to Sit: with supervision PT Goal: Supine/Side to Sit - Progress: Goal set today Pt will go Sit to Supine/Side: with supervision PT Goal: Sit to Supine/Side - Progress: Goal set today Pt will go Sit to Stand: with supervision PT Goal: Sit to Stand - Progress: Goal set today Pt will go Stand to Sit: with supervision PT Goal: Stand to Sit - Progress: Goal set today Pt will Ambulate: 16 - 50 feet;with supervision;with least restrictive assistive device PT Goal: Ambulate - Progress: Goal set today  Visit Information  Last PT Received On: 01/05/13 Assistance Needed: +1 (can incr distance easier if follow with chair)    Subjective Data  Subjective: Pt's husband states she can't stand very long. Patient Stated Goal: Return home   Prior Functioning  Home Living Lives With: Spouse Available Help at Discharge: Available 24 hours/day Type of Home: House Home Access: Stairs to enter [employer] of Steps: 2 Entrance Stairs-Rails: None Home Layout: One level Bathroom Shower/Tub: Walk-in shower;Other (comment) (built in seat) Bathroom Toilet: Standard Bathroom Accessibility: Yes Home Adaptive Equipment: Grab bars in shower;Bedside commode/3-in-1;Walker - four wheeled;Wheelchair - powered Additional Comments: used the walker in the house and the powered wheelchair out in the community. Prior Function Level of Independence: Needs assistance Needs Assistance: Bathing;Dressing;Grooming Bath: Minimal Dressing: Supervision/set-up Grooming: Moderate Driving:  No Comments: pt could walk to the bathroom with the walker without help when she  was doing good. Communication Communication: HOH Dominant Hand: Right    Cognition  Cognition Overall Cognitive Status: Impaired Area of Impairment: Memory Arousal/Alertness: Awake/alert Orientation Level: Time;Place;Disoriented to    Extremity/Trunk Assessment Right Upper Extremity Assessment RUE ROM/Strength/Tone: Deficits RUE ROM/Strength/Tone Deficits: Pt with AROM shoulder flexion 0-50 degrees.  Pt with history of right shoulder arthritis and pain.  Elbow flexion and extension AROM WFLs, but elbow strength 3/5, grip strength 4/5 RUE Sensation: WFL - Light Touch RUE Coordination: WFL - gross/fine motor Left Upper Extremity Assessment LUE ROM/Strength/Tone: Deficits LUE ROM/Strength/Tone Deficits: Pt exhibits strength deficits in the shoulder 3/5 for flexion , elbow 3+5 LUE Sensation: WFL - Light Touch LUE Coordination: WFL - gross/fine motor Right Lower Extremity Assessment RLE ROM/Strength/Tone: Deficits RLE ROM/Strength/Tone Deficits: grossly 3+/5 Left Lower Extremity Assessment LLE ROM/Strength/Tone: Deficits LLE ROM/Strength/Tone Deficits: grossly 3+/5   Balance Balance Balance Assessed: Yes Static Sitting Balance Static Sitting - Balance Support: Right upper extremity supported;Left upper extremity supported;Feet supported;Feet unsupported Static Sitting - Level of Assistance: 5: Stand by assistance  End of Session PT - End of Session Equipment Utilized During Treatment: Gait belt Activity Tolerance: Patient limited by fatigue Patient left: in chair;with call bell/phone within reach;with family/visitor present  GP     [facility] 01/05/2013, 10:44 AM  Skip Mayer PT [phone]

## 2013-01-05 NOTE — Clinical Social Work Psychosocial (Signed)
Clinical Social Work Department BRIEF PSYCHOSOCIAL ASSESSMENT 01/05/2013  Patient:  Erika, Russo     Account Number:  [id number]     Admit date:  01/04/2013  Clinical Social Worker:  Delmer Islam  Date/Time:  01/05/2013 04:45 PM  Referred by:  Physician  Date Referred:  01/04/2013 Referred for  Other - See comment   Other Referral:   Weakness; SNF for short term rehab   Interview type:  Patient Other interview type:   Patient's husband    PSYCHOSOCIAL DATA Living Status:  ALONE Admitted from facility:   Level of care:   Primary support name:  BERNADETTA ROELL Primary support relationship to patient:  SPOUSE Degree of support available:   Husband expresses a great deal of concern for the patient.    CURRENT CONCERNS Current Concerns  Post-Acute Placement   Other Concerns:    SOCIAL WORK ASSESSMENT / PLAN CSW intern received a consult from MD to speak with the patient about discharge plans. CSW intern introduced self and acknowledged the patient who is alert and oriented. Patient discussed with CSW intern her plans for discharge. Patient expressed that she would like to return home but is concerned that her husband may not be able to assist her. Patient informed CSW intern about her husband's physical condition in January and expressed that she did not want to be a burden on him. CSW intern informed the patient of SNF Placement as another option. Patient appeared interested in more information about SNF placement but advised CSW intern to follow up once her husband arrives.    CSW and CSW intern followed up with patient and husband to provide information about SNF's. Patient was very concerned with the quality of care she would receive at a SNF. Husband expressed to CSW that the patient needs to gain more strength before returning home. Patient was able to acknowledge that she is weak and does need more assistance. CSW intern provided the patient and husband with a list of skilled  nursing facility in [facility] and explained the process of SNF placement for the patient. Patient and husband provided CSW with facility preferences with [facility] being the first and [facility] as the second. CSW intern informed the patient and husband that once CSW is contacted with facility responses, CSW will follow up to provide bed offers.   Assessment/plan status:  Psychosocial Support/Ongoing Assessment of Needs Other assessment/ plan:   CSW intern will follow up to provide bed offers to the patient and husband.   Information/referral to community resources:   CSW intern provided the patient and husband with a list of SNF in [place].    PATIENTS/FAMILYS RESPONSE TO PLAN OF CARE: Patient and husband was very appreciative of the services provided by CSW.    [name], Social Work Intern 01/05/2013

## 2013-01-05 NOTE — Progress Notes (Signed)
Pt's CBG 410,Dr. South's office was call to the after hours number,operator supposed to page the MD. On call for the RN,still waiting from the call back.pt. Got 9 units of novo log and a stat blood glucose was ordered.keep monitoring pt. Closely and assessing her needs.

## 2013-01-05 NOTE — Progress Notes (Signed)
Utilization review completed.  

## 2013-01-05 NOTE — Clinical Social Work Placement (Addendum)
Clinical Social Work Department CLINICAL SOCIAL WORK PLACEMENT NOTE 01/05/2013  Patient:  [name], [name]  Account Number:  [id number] Admit date:  01/04/2013  Clinical Social Worker:  Genelle Bal, LCSW  Date/time:  01/05/2013 05:26 PM  Clinical Social Work is seeking post-discharge placement for this patient at the following level of care:   SKILLED NURSING   (*CSW will update this form in Epic as items are completed)   01/05/2013  Patient/family provided with [facility] Department of Clinical Social Work's list of facilities offering this level of care within the geographic area requested by the patient (or if unable, by the patient's family).  01/05/2013  Patient/family informed of their freedom to choose among providers that offer the needed level of care, that participate in Medicare, Medicaid or managed care program needed by the patient, have an available bed and are willing to accept the patient.    Patient/family informed of [facility]' ownership interest in [facility], as well as of the fact that they are under no obligation to receive care at this facility.  PASARR submitted to EDS on 01/06/13 PASARR number received from EDS on 01/06/13  FL2 transmitted to all facilities in geographic area requested by pt/family on 01/06/13  FL2 transmitted to all facilities within larger geographic area on   Patient informed that his/her managed care company has contracts with or will negotiate with  certain facilities, including the following:     Patient/family informed of bed offers received: 01/06/13  Patient chooses bed at [facility] Physician recommends and patient chooses bed at    Patient to be transferred to [facility] on 01/08/13   Patient to be transferred to facility by ambulance  The following physician request were entered in Epic:   Additional Comments: 01/08/13 - Patient aware of d/c today. Mr. Stegner plans to visit [facility] to see patient's room.     Patient's medical packet compiled and will accompany her to facility.    Fernande Boyden, Social Work Intern 01/06/2103

## 2013-01-05 NOTE — Consult Note (Signed)
Referring Provider: No ref. provider found Primary Care Physician:  Julian Hy, MD Primary Nephrologist:  Dr. 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 She presented to ER with generalized weakness, fatigue, inability to walk on her own using walker at home.Baseline Creatinine 2mg /dl   Past Medical History  Diagnosis Date  . Diabetes mellitus   . Diabetic neuropathy   . Hypothyroidism   . Hypertension   . GERD (gastroesophageal reflux disease)   . Heart murmur     not treated for  . Arthritis   . Chronic kidney disease     RENAL TRANSPLANT IN 2008--PT IS FOLLOWED BY DR. Karie Fetch WITH BUN OF 37 AND CREAT 1.7--PER NOTE DR. NESI FROM 04/11/12  . Urethral diverticulum   . Eyesight diminished     GLAUCOMA   . History of shingles     LEFT EYE JAN 2012--STILL HAS EYE PAIN-STATES NOT ABLE TO TAKE THE MEDICATION FOR SHINGLES BECAUSE OF HER HX OF KIDNEY TRANSPLANT  . Fingernail abnormalities     FUNGUS OF FINGERNAILS    Past Surgical History  Procedure Laterality Date  . Kidney transplant  2008    At [facility]  . Back surgery      ruptured disk  . Abdominal hysterectomy    . Appendectomy    . Laser surgery of both eyes for hemorrhages    . 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 Urethral diverticulectomy  04/28/2012    Procedure: URETHRAL DIVERTICULECTOMY;  Surgeon: Antony Haste, MD;  Location: [facility];  Service: Urology;  Laterality: N/A;    Prior to Admission medications   Medication Sig Start Date End Date Taking? Authorizing Provider  allopurinol (ZYLOPRIM) 300 MG tablet Take 150 mg by mouth daily with breakfast.    Yes Historical Provider, MD  amLODipine (NORVASC) 10 MG tablet Take 10 mg by mouth daily with breakfast.    Yes Historical Provider, MD  calcitRIOL (ROCALTROL) 0.5 MCG capsule  Take 0.5 mcg by mouth daily with breakfast.    Yes Historical Provider, MD  calcium carbonate (TUMS - DOSED IN MG ELEMENTAL CALCIUM) 500 MG chewable tablet Chew 2 tablets by mouth 3 (three) times daily with meals.   Yes Historical Provider, MD  fentaNYL (DURAGESIC - DOSED MCG/HR) 12 MCG/HR Place 1 patch onto the skin every 3 (three) days. New patch was applied on Thursday   Yes Historical Provider, MD  fentaNYL (DURAGESIC - DOSED MCG/HR) 50 MCG/HR Place 1 patch onto the skin every 3 (three) days. New patch was applied on Thursday   Yes Historical Provider, MD  furosemide (LASIX) 40 MG tablet Take 40 mg by mouth 2 (two) times daily.   Yes Historical Provider, MD  gabapentin (NEURONTIN) 300 MG capsule Take 300 mg by mouth 3 (three) times daily.   Yes Historical Provider, MD  insulin detemir (LEVEMIR) 100 UNIT/ML injection Inject 28 Units into the skin daily with breakfast.    Yes Historical Provider, MD  insulin lispro (HUMALOG) 100 UNIT/ML injection Inject 3-12 Units into the skin 3 (three) times daily before meals. Pt is on a sliding scale   Yes Historical Provider, MD  ketoconazole (NIZORAL) 2 % cream Apply 1 application topically 2 (two) times daily.   Yes Historical Provider, MD  labetalol (NORMODYNE) 200 MG tablet Take 300  mg by mouth 3 (three) times daily. Takes 1 and 1/2 tabs   Yes Historical Provider, MD  latanoprost (XALATAN) 0.005 % ophthalmic solution Place 1 drop into the right eye at bedtime.   Yes Historical Provider, MD  levothyroxine (SYNTHROID, LEVOTHROID) 112 MCG tablet Take 224 mcg by mouth daily with breakfast.   Yes Historical Provider, MD  mycophenolate (CELLCEPT) 250 MG capsule Take 500 mg by mouth 2 (two) times daily.   Yes Historical Provider, MD  potassium chloride SA (K-DUR,KLOR-CON) 20 MEQ tablet Take 20 mEq by mouth 2 (two) times daily.   Yes Historical Provider, MD  predniSONE (DELTASONE) 5 MG tablet Take 10 mg by mouth daily with breakfast.    Yes Historical Provider, MD   ranitidine (ZANTAC) 150 MG tablet Take 150 mg by mouth daily.   Yes Historical Provider, MD  sirolimus (RAPAMUNE) 1 MG tablet Take 3 mg by mouth daily. Pt takes three 1 mg tablets = 3 mg daily   Yes Historical Provider, MD  urea (CARMOL) 40 % CREA Apply 1 application topically 2 (two) times daily. Apply to finger nails, and cuticles.   Yes Historical Provider, MD  ciprofloxacin (CIPRO) 500 MG tablet Take 1 tablet (500 mg total) by mouth 2 (two) times daily. 01/04/13   Ethelda Chick, MD    Current Facility-Administered Medications  Medication Dose Route Frequency Provider Last Rate Last Dose  . 0.9 % NaCl with KCl 20 mEq/ L  infusion   Intravenous Continuous Gwen Pounds, MD 50 mL/hr at 01/05/13 1100    . acetaminophen (TYLENOL) tablet 650 mg  650 mg Oral Q6H PRN Gwen Pounds, MD       Or  . acetaminophen (TYLENOL) suppository 650 mg  650 mg Rectal Q6H PRN Gwen Pounds, MD      . allopurinol (ZYLOPRIM) tablet 150 mg  150 mg Oral Q breakfast Gwen Pounds, MD   150 mg at 01/05/13 0844  . amLODipine (NORVASC) tablet 10 mg  10 mg Oral Q breakfast Gwen Pounds, MD   10 mg at 01/05/13 0844  . calcitRIOL (ROCALTROL) capsule 0.5 mcg  0.5 mcg Oral Q breakfast Gwen Pounds, MD   0.5 mcg at 01/05/13 0844  . calcium carbonate (TUMS - dosed in mg elemental calcium) chewable tablet 400 mg of elemental calcium  2 tablet Oral TID WC Gwen Pounds, MD   400 mg of elemental calcium at 01/05/13 0845  . cefTRIAXone (ROCEPHIN) 1 g in dextrose 5 % 50 mL IVPB  1 g Intravenous Q24H Gwen Pounds, MD   1 g at 01/05/13 0120  . famotidine (PEPCID) tablet 20 mg  20 mg Oral Daily Gwen Pounds, MD   20 mg at 01/05/13 1054  . fentaNYL (DURAGESIC - dosed mcg/hr) 12.5 mcg  12.5 mcg Transdermal Q72H Gwen Pounds, MD   12.5 mcg at 01/04/13 2335  . fentaNYL (DURAGESIC - dosed mcg/hr) patch 50 mcg  50 mcg Transdermal Q72H Gwen Pounds, MD   50 mcg at 01/04/13 2334  . gabapentin (NEURONTIN) capsule 300 mg  300 mg Oral Daily Garnetta Buddy, MD      . heparin injection 5,000 Units  5,000 Units Subcutaneous Q8H Gwen Pounds, MD   5,000 Units at 01/05/13 0631  . insulin aspart (novoLOG) injection 0-9 Units  0-9 Units Subcutaneous TID WC Gwen Pounds, MD   1 Units at 01/05/13 [phone]  . insulin detemir (LEVEMIR) injection 10  Units  10 Units Subcutaneous Q breakfast Gwen Pounds, MD   10 Units at 01/05/13 [phone]  . ketoconazole (NIZORAL) 2 % cream 1 application  1 application Topical BID Gwen Pounds, MD   1 application at 01/05/13 1055  . labetalol (NORMODYNE) tablet 300 mg  300 mg Oral TID Gwen Pounds, MD   300 mg at 01/05/13 1054  . latanoprost (XALATAN) 0.005 % ophthalmic solution 1 drop  1 drop Right Eye QHS Gwen Pounds, MD   1 drop at 01/04/13 2332  . levothyroxine (SYNTHROID, LEVOTHROID) tablet 224 mcg  224 mcg Oral Q breakfast Gwen Pounds, MD   224 mcg at 01/05/13 0845  . methylPREDNISolone sodium succinate (SOLU-MEDROL) 40 mg/mL injection 40 mg  40 mg Intravenous Q12H Gwen Pounds, MD   40 mg at 01/05/13 0846  . mycophenolate (CELLCEPT) capsule 500 mg  500 mg Oral BID Gwen Pounds, MD   500 mg at 01/05/13 1053  . potassium chloride SA (K-DUR,KLOR-CON) CR tablet 20 mEq  20 mEq Oral BID Gwen Pounds, MD   20 mEq at 01/05/13 1053  . predniSONE (DELTASONE) tablet 10 mg  10 mg Oral Q breakfast Gwen Pounds, MD   10 mg at 01/05/13 0844  . sirolimus (RAPAMUNE) tablet 3 mg  3 mg Oral Daily Gwen Pounds, MD   3 mg at 01/05/13 1053   Facility-Administered Medications Ordered in Other Encounters  Medication Dose Route Frequency Provider Last Rate Last Dose  . clindamycin (CLEOCIN) 2 % vaginal cream 1 Applicatorful  1 Applicatorful Vaginal QHS Antony Haste, MD        Allergies as of 01/04/2013 - Review Complete 01/04/2013  Allergen Reaction Noted  . Adhesive (tape)  03/05/2012  . Aspirin  09/11/2011  . Hydrocodone Other (See Comments) 09/11/2011  . Iohexol  11/13/2010  . Oxycodone  03/05/2012  . Penicillins Hives and  Swelling 09/11/2011  . Tacrolimus Other (See Comments) 09/11/2011  . Contrast media (iodinated diagnostic agents) Hives, Swelling, and Rash 09/11/2011    Family History  Problem Relation Age of Onset  . Anesthesia problems Neg Hx     History   Social History  . Marital Status: Married    Spouse Name: N/A    Number of Children: N/A  . Years of Education: N/A   Occupational History  . Not on file.   Social History Main Topics  . Smoking status: Former Smoker -- 0.50 packs/day for 20 years    Quit date: 10/21/1978  . Smokeless tobacco: Never Used  . Alcohol Use: No  . Drug Use: No  . Sexually Active: No   Other Topics Concern  . Not on file   Social History Narrative  . No narrative on file    Review of Systems: Gen: Chivering chills and weakness HEENT: No visual complaints, No history of Retinopathy. Normal external appearance No Epistaxis or Sore throat. No sinusitis.   CV: Denies chest pain, angina, palpitations, syncope, orthopnea, PND, peripheral edema, and claudication. Resp: Denies dyspnea at rest, dyspnea with exercise, cough, sputum, wheezing, coughing up blood, and pleurisy. GI: Denies vomiting blood, jaundice, and fecal incontinence.   Denies dysphagia or odynophagia. GU : Denies urinary burning, blood in urine, urinary frequency, urinary hesitancy, nocturnal urination, and urinary incontinence.  No renal calculi. MS: Denies joint pain, limitation of movement, and swelling, stiffness, low back pain, extremity pain. Denies muscle weakness, cramps, atrophy.  No use of non steroidal antiinflammatory drugs.  Derm: Denies rash, itching, dry skin, hives, moles, warts, or unhealing ulcers.  Psych: Denies depression, anxiety, memory loss, suicidal ideation, hallucinations, paranoia, and confusion. Heme: Denies bruising, bleeding, and enlarged lymph nodes. Neuro: Weakness   Physical Exam: Vital signs in last 24 hours: Temp:  [98.8 F (37.1 C)-100.5 F (38.1 C)]  99.5 F (37.5 C) (03/18 0942) Pulse Rate:  [73-95] 86 (03/18 0942) Resp:  [16-18] 16 (03/18 0942) BP: (110-151)/(44-77) 134/51 mmHg (03/18 0942) SpO2:  [91 %-98 %] 92 % (03/18 0942) Weight:  [63.3 kg (139 lb 8.8 oz)] 63.3 kg (139 lb 8.8 oz) (03/17 2003) Last BM Date: 01/04/13 General:   Alert,  Well-developed, well-nourished, pleasant and cooperative in NAD Head:  Normocephalic and atraumatic. Eyes:  Sclera clear, no icterus.   Conjunctiva pink. Ears:  Normal auditory acuity. Nose:  No deformity, discharge,  or lesions. Mouth:  edentulous Neck:  Supple; no masses or thyromegaly. JVP not elevated Lungs:  Clear throughout to auscultation.   No wheezes, crackles, or rhonchi. No acute distress. Heart:  Regular rate and rhythm; no murmurs, clicks, rubs,  or gallops. Abdomen:  Soft, nontender and nondistended. No masses, hepatosplenomegaly or hernias noted. Normal bowel sounds, without guarding, and without rebound.   Msk:  Symmetrical without gross deformities. Normal posture. Pulses:  No carotid, renal, femoral bruits. DP and PT symmetrical and equal Extremities:  Without clubbing or edema. Neurologic:  Alert and  oriented x4;  grossly normal neurologically. Skin:  Intact without significant lesions or rashes. Cervical Nodes:  No significant cervical adenopathy. Psych:  Alert and cooperative. Normal mood and affect.  Intake/Output from previous day: 03/17 0701 - 03/18 0700 In: 120 [P.O.:120] Out: 200 [Urine:200] Intake/Output this shift: Total I/O In: 400 [P.O.:200; I.V.:200] Out: -   Lab Results:  Recent Labs  01/03/13 2343 01/04/13 1657 01/05/13 0545  WBC 6.1 8.7 6.7  HGB 12.0 12.2 12.3  HCT 38.9 38.1 38.4  PLT 214 196 183   BMET  Recent Labs  01/03/13 2343 01/04/13 1657 01/05/13 0545  NA 144 142 143  K 3.2* 3.3* 3.8  CL 98 97 100  CO2 38* 36* 32  GLUCOSE 62* 115* 88  BUN 37* 36* 33*  CREATININE 2.21* 2.38* 2.22*  CALCIUM 9.4 9.5 9.0   LFT  Recent Labs   01/03/13 2343  PROT 7.1  ALBUMIN 3.5  AST 17  ALT 8  ALKPHOS 53  BILITOT 0.6   PT/INR No results found for this basename: LABPROT, INR,  in the last 72 hours Hepatitis Panel No results found for this basename: HEPBSAG, HCVAB, HEPAIGM, HEPBIGM,  in the last 72 hours  Studies/Results: Dg Chest 2 View  01/04/2013  *RADIOLOGY REPORT*  Clinical Data: Fatigue.  Generalized weakness.  Shortness of breath.  CHEST - 2 VIEW  Comparison: Two-view chest x-ray 08/22/2012, 06/27/2012, 04/30/2012.  Findings: Cardiac silhouette moderately enlarged but stable. Pulmonary vascularity normal without evidence of pulmonary edema. Stable chronic elevation of the right hemidiaphragm.  No confluent airspace consolidation.  No pleural effusions.  Degenerative changes involving the thoracic spine and the right shoulder joint.  IMPRESSION: Stable cardiomegaly.  No acute cardiopulmonary disease.   Original Report Authenticated By: Hulan Saas, M.D.     Assessment/Plan:  ESRD-deceased donor  Cr 2.2 baseline  ANEMIA-stable  MBD-stable  HTN/VOL-controlled  Immunosuppression MMF Sirolimus    LOS: 1 Mykal Kirchman W @TODAY @11 :43 AM

## 2013-01-05 NOTE — Care Management Note (Signed)
   CARE MANAGEMENT NOTE 01/05/2013  Patient:  [name], [name]   Account Number:  [id number]  Date Initiated:  01/05/2013  Documentation initiated by:  Lavone Barrientes  Subjective/Objective Assessment:   Consult for [facility] needs.     Action/Plan:   Await PT/OT eval and recommendations.   Anticipated DC Date:  01/07/2013   Anticipated DC Plan:           Choice offered to / List presented to:             Status of service:  In process, will continue to follow Medicare Important Message given?   (If response is "NO", the following Medicare IM given date fields will be blank) Date Medicare IM given:   Date Additional Medicare IM given:    Discharge Disposition:    Per UR Regulation:    If discussed at Long Length of Stay Meetings, dates discussed:    Comments:

## 2013-01-05 NOTE — Progress Notes (Signed)
Subjective: Had a pretty good night. No chills or sweats. No nausea. Some back pain. Breathing OK Objective: Vital signs in last 24 hours: Temp:  [98.8 F (37.1 C)-100.5 F (38.1 C)] 100.5 F (38.1 C) (03/18 0519) Pulse Rate:  [73-95] 73 (03/18 0519) Resp:  [16-18] 16 (03/18 0519) BP: (110-151)/(44-77) 147/53 mmHg (03/18 0519) SpO2:  [93 %-98 %] 93 % (03/18 0519) Weight:  [63.3 kg (139 lb 8.8 oz)] 63.3 kg (139 lb 8.8 oz) (03/17 2003)  Intake/Output from previous day: 03/17 0701 - 03/18 0700 In: 120 [P.O.:120] Out: 200 [Urine:200] Intake/Output this shift:    General: fatigued left eye is less swollen than usual. Oral membranes moist. Neck supple. Lungs clear no wheeze. Ht regular with sys murmur. abd soft NT, good BS's. Awake, relatively clear speech, mentating well, no tremor  Lab Results   Recent Labs  01/04/13 1657 01/05/13 0545  WBC 8.7 6.7  RBC 4.44 4.60  HGB 12.2 12.3  HCT 38.1 38.4  MCV 85.8 83.5  MCH 27.5 26.7  RDW 18.1* 18.3*  PLT 196 183    Recent Labs  01/04/13 1657 01/05/13 0545  NA 142 143  K 3.3* 3.8  CL 97 100  CO2 36* 32  GLUCOSE 115* 88  BUN 36* 33*  CREATININE 2.38* 2.22*  CALCIUM 9.5 9.0    Studies/Results: Dg Chest 2 View  01/04/2013  *RADIOLOGY REPORT*  Clinical Data: Fatigue.  Generalized weakness.  Shortness of breath.  CHEST - 2 VIEW  Comparison: Two-view chest x-ray 08/22/2012, 06/27/2012, 04/30/2012.  Findings: Cardiac silhouette moderately enlarged but stable. Pulmonary vascularity normal without evidence of pulmonary edema. Stable chronic elevation of the right hemidiaphragm.  No confluent airspace consolidation.  No pleural effusions.  Degenerative changes involving the thoracic spine and the right shoulder joint.  IMPRESSION: Stable cardiomegaly.  No acute cardiopulmonary disease.   Original Report Authenticated By: Hulan Saas, M.D.     Scheduled Meds: . allopurinol  150 mg Oral Q breakfast  . amLODipine  10 mg Oral Q  breakfast  . calcitRIOL  0.5 mcg Oral Q breakfast  . calcium carbonate  2 tablet Oral TID WC  . cefTRIAXone (ROCEPHIN)  IV  1 g Intravenous Q24H  . famotidine  20 mg Oral Daily  . fentaNYL  12.5 mcg Transdermal Q72H  . fentaNYL  50 mcg Transdermal Q72H  . gabapentin  300 mg Oral TID  . heparin  5,000 Units Subcutaneous Q8H  . insulin aspart  0-9 Units Subcutaneous TID WC  . insulin detemir  10 Units Subcutaneous Q breakfast  . ketoconazole  1 application Topical BID  . labetalol  300 mg Oral TID  . latanoprost  1 drop Right Eye QHS  . levothyroxine  224 mcg Oral Q breakfast  . methylPREDNISolone (SOLU-MEDROL) injection  40 mg Intravenous Q12H  . mycophenolate  500 mg Oral BID  . potassium chloride SA  20 mEq Oral BID  . predniSONE  10 mg Oral Q breakfast  . sirolimus  3 mg Oral Daily   Continuous Infusions: . 0.9 % NaCl with KCl 20 mEq / L 50 mL/hr at 01/05/13 0018   PRN Meds:acetaminophen, acetaminophen  Assessment/Plan:  1. Confusion/AMS/Lethargy due to recurrent UTI/S/P Bladder Diverticulum surgery (Dr Mena Goes) 05/2012 -: mentating much better this AM.  Tmax 100.5 overnight. Prior UTI in FEB was enterococcus. Current cxs pending.  2. AMS/Metabolic Encephalopathy- improving 3. Diabetes mellitus type 2, uncontrolled-FBS reasonable at 149. Need to avoid lows  4. Hypertension- improved  5. Chronic kidney disease, stage III (moderate) in pt with H/O Kidney transplant - continue anti-rejection drugs. Doing a little better  6 PT/OT/CW for Adult FTT/Weakness and deconditioning.  7 DVT Proph ordered.  8 Hypokalemia: better at 3.8  9. On chronic Prednisone - bump the dose for stress dose and it will help with Hypoglycemia.. 10.Hypothyroid: check TSH 11. DM neuropathy: on Rx    LOS: 1 day   Delora Gravatt [name] 01/05/2013, 7:53 AM

## 2013-01-05 NOTE — Evaluation (Signed)
Occupational Therapy Evaluation Patient Details Name: [name] MRN: [id number] DOB: [birth year] Today's Date: 01/05/2013 Time: 4098-1191 OT Time Calculation (min): 31 min  OT Assessment / Plan / Recommendation Clinical Impression  Pt is an 72 yr old female admiitted with UTI and overall weakness.  Overall needs mod to max assist for simulated selfcare tasks and functional transfers.  Will benefit from acute care OT to help increase overall independence with basic selfcare and functional transfers in order to return home with her husband.  He can provide 24 hour supervision so feel pt will benefit from follow-up HHOT at discharge.      OT Assessment  Patient needs continued OT Services    Follow Up Recommendations  Home health OT    Barriers to Discharge None    Equipment Recommendations  None recommended by OT       Frequency  Min 2X/week    Precautions / Restrictions Precautions Precautions: Fall Restrictions Weight Bearing Restrictions: No   Pertinent Vitals/Pain Pt with no report of pain O2 sats 91 % on room air, and HR 4 BPM    ADL  Eating/Feeding: Simulated;Set up Where Assessed - Eating/Feeding: Chair Grooming: Simulated;Minimal assistance Where Assessed - Grooming: Supported sitting Upper Body Bathing: Simulated;Set up Where Assessed - Upper Body Bathing: Unsupported sitting Lower Body Bathing: Simulated;Moderate assistance Where Assessed - Lower Body Bathing: Supported sit to stand Upper Body Dressing: Simulated;Minimal assistance Where Assessed - Upper Body Dressing: Unsupported sitting Lower Body Dressing: Simulated;Moderate assistance Where Assessed - Lower Body Dressing: Supported sit to Pharmacist, hospital: Simulated;Moderate assistance Toilet Transfer Method: Stand pivot Toilet Transfer Equipment: Other (comment) (to bedside chair) Toileting - Clothing Manipulation and Hygiene: Simulated;Moderate assistance Where Assessed - Toileting Clothing  Manipulation and Hygiene: Other (comment) (sit to stand from the EOB) Tub/Shower Transfer Method: Not assessed Transfers/Ambulation Related to ADLs: Pt required mod assistance to perform stand pivot from bed to bedside chair, without assistive device. ADL Comments: Pt with decreased ability to perform LB dressing tasks secondary to weakness.  She was able to cross her LEs over each knee and reach down to her foot but because of her right shoulder weakness and arthritic changes she is unable to reach her foot.on the left.    OT Diagnosis: Generalized weakness;Acute pain  OT Problem List: Decreased strength;Decreased activity tolerance;Impaired balance (sitting and/or standing);Decreased knowledge of use of DME or AE;Impaired UE functional use;Decreased range of motion OT Treatment Interventions: Self-care/ADL training;Therapeutic activities;Therapeutic exercise;Energy conservation;Patient/family education;DME and/or AE instruction;Balance training   OT Goals Acute Rehab OT Goals OT Goal Formulation: With patient/family Time For Goal Achievement: 01/19/13 Potential to Achieve Goals: Good ADL Goals Pt Will Perform Grooming: with min assist;Standing at sink;Other (comment);Supported (2 tasks) ADL Goal: Grooming - Progress: Goal set today Pt Will Perform Lower Body Bathing: with min assist;Supported;Sit to stand from bed ADL Goal: Lower Body Bathing - Progress: Goal set today Pt Will Perform Lower Body Dressing: with min assist;Sit to stand from bed;Supported ADL Goal: Lower Body Dressing - Progress: Goal set today Pt Will Transfer to Toilet: with min assist;with DME;3-in-1;Ambulation ADL Goal: Toilet Transfer - Progress: Goal set today Pt Will Perform Toileting - Clothing Manipulation: with min assist;Sitting on 3-in-1 or toilet;Standing ADL Goal: Toileting - Clothing Manipulation - Progress: Goal set today Pt Will Perform Toileting - Hygiene: with min assist;Sit to stand from 3-in-1/toilet ADL  Goal: Toileting - Hygiene - Progress: Goal set today Miscellaneous OT Goals Miscellaneous OT Goal #1: Pt will increase right shoulder  strength to 3-/5 for greater use with functional grooming tasks. OT Goal: Miscellaneous Goal #1 - Progress: Goal set today  Visit Information  Last OT Received On: 01/05/13 Assistance Needed: +1    Subjective Data  Subjective: You want me to get up in the chair now? Patient Stated Goal: Husband wants her to get back her strength.   Prior Functioning     Home Living Lives With: Spouse Available Help at Discharge: Available 24 hours/day Type of Home: House Home Access: Stairs to enter [employer] of Steps: 2 Entrance Stairs-Rails: None Home Layout: One level Bathroom Shower/Tub: Walk-in shower;Other (comment) (built in seat) Bathroom Toilet: Standard Bathroom Accessibility: Yes Home Adaptive Equipment: Grab bars in shower;Bedside commode/3-in-1;Walker - four wheeled;Wheelchair - powered Additional Comments: used the walker in the house and the powered wheelchair out in the community. Prior Function Level of Independence: Needs assistance Needs Assistance: Bathing;Dressing;Grooming Bath: Minimal Dressing: Supervision/set-up Grooming: Moderate Driving: No Comments: pt could walk to the bathroom with the walker without help when she was doing good. 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 for a glasses change. Perception Perception: Within Functional Limits Praxis Praxis: Intact   Cognition  Cognition Overall Cognitive Status: Impaired Area of Impairment: Memory Arousal/Alertness:  Awake/alert Orientation Level: Time;Place;Disoriented to    Extremity/Trunk Assessment Right Upper Extremity Assessment RUE ROM/Strength/Tone: Deficits RUE ROM/Strength/Tone Deficits: Pt with AROM shoulder flexion 0-50 degrees.  Pt with history of right shoulder arthritis and pain.  Elbow flexion and extension AROM WFLs, but elbow strength 3/5, grip strength 4/5 RUE Sensation: WFL - Light Touch RUE Coordination: WFL - gross/fine motor Left Upper Extremity Assessment LUE ROM/Strength/Tone: Deficits LUE ROM/Strength/Tone Deficits: Pt exhibits strength deficits in the shoulder 3/5 for flexion , elbow 3+5 LUE Sensation: WFL - Light Touch LUE Coordination: WFL - gross/fine motor     Mobility Bed Mobility Bed Mobility: Supine to Sit Supine to Sit: 2: Max assist;HOB elevated Details for Bed Mobility Assistance: Pt requires max assistance for transition from supine to sitting EOB including scooting forward. Transfers Transfers: Sit to Stand;Stand to Sit Sit to Stand: 3: Mod assist;With upper extremity assist;From bed Stand to Sit: 3: Mod assist;To chair/3-in-1        Balance Balance Balance Assessed: Yes Static Sitting Balance Static Sitting - Balance Support: Right upper extremity supported;Left upper extremity supported;Feet supported;Feet unsupported Static Sitting - Level of Assistance: 5: Stand by assistance   End of Session OT - End of Session Activity Tolerance: Patient limited by fatigue Patient left: in chair;with call bell/phone within reach;with family/visitor present Nurse Communication: Mobility status     Valina Maes OTR/L Pager number [phone] 01/05/2013, 9:52 AM

## 2013-01-06 LAB — GLUCOSE, CAPILLARY
Glucose-Capillary: 416 mg/dL — ABNORMAL HIGH (ref 70–99)
Glucose-Capillary: 450 mg/dL — ABNORMAL HIGH (ref 70–99)
Glucose-Capillary: 396 mg/dL — ABNORMAL HIGH (ref 70–99)
Glucose-Capillary: 248 mg/dL — ABNORMAL HIGH (ref 70–99)
Glucose-Capillary: 240 mg/dL — ABNORMAL HIGH (ref 70–99)

## 2013-01-06 LAB — COMPREHENSIVE METABOLIC PANEL
ALT: 7 U/L (ref 0–35)
AST: 11 U/L (ref 0–37)
Albumin: 3 g/dL — ABNORMAL LOW (ref 3.5–5.2)
Calcium: 8.6 mg/dL (ref 8.4–10.5)
GFR calc Af Amer: 23 mL/min — ABNORMAL LOW (ref >90)
Potassium: 5.6 mEq/L — ABNORMAL HIGH (ref 3.5–5.1)
Sodium: 140 mEq/L (ref 135–145)
Total Protein: 6.5 g/dL (ref 6.0–8.3)

## 2013-01-06 LAB — URINE CULTURE
Colony Count: >=100000
Colony Count: >=100000

## 2013-01-06 MED ORDER — INSULIN ASPART 100 UNIT/ML ~~LOC~~ SOLN
0.0000 [IU] | Freq: Three times a day (TID) | SUBCUTANEOUS | Status: DC
  Administered 2013-01-06: 7 [IU] via SUBCUTANEOUS
  Administered 2013-01-06: 13:00:00 via SUBCUTANEOUS
  Administered 2013-01-07 (×2): 3 [IU] via SUBCUTANEOUS
  Administered 2013-01-07: 7 [IU] via SUBCUTANEOUS

## 2013-01-06 MED ORDER — INSULIN DETEMIR 100 UNIT/ML ~~LOC~~ SOLN
20.0000 [IU] | Freq: Every day | SUBCUTANEOUS | Status: DC
  Administered 2013-01-07: 20 [IU] via SUBCUTANEOUS
  Filled 2013-01-06 (×3): qty 0.2

## 2013-01-06 MED ORDER — INSULIN ASPART 100 UNIT/ML ~~LOC~~ SOLN
0.0000 [IU] | Freq: Every day | SUBCUTANEOUS | Status: DC
  Administered 2013-01-06: 2 [IU] via SUBCUTANEOUS

## 2013-01-06 MED ORDER — INSULIN ASPART 100 UNIT/ML ~~LOC~~ SOLN
4.0000 [IU] | Freq: Three times a day (TID) | SUBCUTANEOUS | Status: DC
  Administered 2013-01-06 – 2013-01-08 (×6): 4 [IU] via SUBCUTANEOUS

## 2013-01-06 MED ORDER — INSULIN ASPART 100 UNIT/ML ~~LOC~~ SOLN
10.0000 [IU] | Freq: Once | SUBCUTANEOUS | Status: AC
  Administered 2013-01-06: 10 [IU] via SUBCUTANEOUS

## 2013-01-06 MED ORDER — INSULIN DETEMIR 100 UNIT/ML ~~LOC~~ SOLN
15.0000 [IU] | Freq: Every day | SUBCUTANEOUS | Status: DC
  Administered 2013-01-06 – 2013-01-08 (×3): 15 [IU] via SUBCUTANEOUS
  Filled 2013-01-06 (×4): qty 0.15

## 2013-01-06 NOTE — Progress Notes (Signed)
Inpatient Diabetes Program Recommendations  AACE/ADA: New Consensus Statement on Inpatient Glycemic Control (2013)  Target Ranges:  Prepandial:   less than 140 mg/dL      Peak postprandial:   less than 180 mg/dL (1-2 hours)      Critically ill patients:  140 - 180 mg/dL    Results for GLENDA, SPELMAN (MRN [id number]) as of 01/06/2013 11:12  Ref. Range 01/05/2013 07:30 01/05/2013 11:21 01/05/2013 16:48 01/05/2013 20:37  Glucose-Capillary Latest Range: 70-99 mg/dL 409 (H) 811 (H) 914 (H) 416 (H)    Results for TALESHA, ELLITHORPE (MRN [id number]) as of 01/06/2013 11:12  Ref. Range 01/06/2013 05:16 01/06/2013 07:49  Glucose-Capillary Latest Range: 70-99 mg/dL 086 (H) 578 (H)   Noted patient having severe glucose elevations.    Per EPIC records, patient received 10 units Levemir yesterday morning at ~9am.  CBGs gradually rose throughout the afternoon, and CBG by 5pm was 410 mg/dl.    Patient was getting IV Solumedrol yesterday in addition to Prednisone.  Noted Solumedrol was ordered as 40 mg IV Q12 hours.  Patient ended up receiving 3 doses of Solumedrol yesterday within a 24 hour span (got 40 mg at midnight, 40 mg at 9am, and 40 mg at 8pm).  Patient only received a portion of her home Levemir dose as well.  These two factors likely caused the extreme hyperglycemia patient has experienced.  Noted Levemir dose increased to bid dosing today.  Also noted Novolog correction scale increased and Novolog meal coverage started at noon today.  IV Soulmedrol d/c'd.  Got last dose at 8am today.   Will follow. Ambrose Finland RN, MSN, CDE Diabetes Coordinator Inpatient Diabetes Program [phone]

## 2013-01-06 NOTE — Progress Notes (Signed)
[facility] ROUNDING NOTE   Subjective:   Interval History: no complaints  Objective:  Vital signs in last 24 hours:  Temp:  [97.3 F (36.3 C)-99.2 F (37.3 C)] 97.5 F (36.4 C) (03/19 0745) Pulse Rate:  [79-88] 88 (03/19 0745) Resp:  [16-18] 16 (03/19 0745) BP: (113-157)/(49-69) 148/69 mmHg (03/19 0745) SpO2:  [93 %-99 %] 94 % (03/19 0745) Weight:  [63.1 kg (139 lb 1.8 oz)] 63.1 kg (139 lb 1.8 oz) (03/18 2042)  Weight change: -0.2 kg (-7.1 oz) Filed Weights   01/04/13 2003 01/05/13 2042  Weight: 63.3 kg (139 lb 8.8 oz) 63.1 kg (139 lb 1.8 oz)    Intake/Output: I/O last 3 completed shifts: In: 2027.5 [P.O.:800; I.V.:1177.5; IV Piggyback:50] Out: 200 [Urine:200]   Intake/Output this shift:  Total I/O In: 120 [P.O.:120] Out: -   CVS- RRR RS- CTA ABD- BS present soft non-distended EXT- no edema   Basic Metabolic Panel:  Recent Labs Lab 01/03/13 2343 01/04/13 1657 01/05/13 0545 01/05/13 1755 01/06/13 0545  NA 144 142 143  --  140  K 3.2* 3.3* 3.8  --  5.6*  CL 98 97 100  --  102  CO2 38* 36* 32  --  29  GLUCOSE 62* 115* 88 480* 441*  BUN 37* 36* 33*  --  44*  CREATININE 2.21* 2.38* 2.22*  --  2.33*  CALCIUM 9.4 9.5 9.0  --  8.6    Liver Function Tests:  Recent Labs Lab 01/03/13 2343 01/06/13 0545  AST 17 11  ALT 8 7  ALKPHOS 53 49  BILITOT 0.6 0.3  PROT 7.1 6.5  ALBUMIN 3.5 3.0*   No results found for this basename: LIPASE, AMYLASE,  in the last 168 hours No results found for this basename: AMMONIA,  in the last 168 hours  CBC:  Recent Labs Lab 01/03/13 2343 01/04/13 1657 01/05/13 0545  WBC 6.1 8.7 6.7  NEUTROABS  --  6.9  --   HGB 12.0 12.2 12.3  HCT 38.9 38.1 38.4  MCV 86.6 85.8 83.5  PLT 214 196 183    Cardiac Enzymes: No results found for this basename: CKTOTAL, CKMB, CKMBINDEX, TROPONINI,  in the last 168 hours  BNP: No components found with this basename: POCBNP,   CBG:  Recent Labs Lab 01/05/13 1121  01/05/13 1648 01/05/13 2037 01/06/13 0516 01/06/13 0749  GLUCAP 262* 410* 416* 450* 396*    Microbiology: Results for orders placed during the hospital encounter of 01/04/13  URINE CULTURE     Status: None   Collection Time    01/04/13 10:36 PM      Result Value Range Status   Specimen Description URINE, RANDOM   Final   Special Requests NONE   Final   Culture  Setup Time 01/05/2013 06:31   Final   Colony Count >=100,000 COLONIES/ML   Final   Culture ENTEROCOCCUS SPECIES   Final   Report Status PENDING   Incomplete    Coagulation Studies: No results found for this basename: LABPROT, INR,  in the last 72 hours  Urinalysis:  Recent Labs  01/04/13 0112 01/04/13 1825  COLORURINE YELLOW YELLOW  LABSPEC 1.013 1.016  PHURINE 5.5 5.0  GLUCOSEU 250* NEGATIVE  HGBUR NEGATIVE LARGE*  BILIRUBINUR NEGATIVE NEGATIVE  KETONESUR NEGATIVE NEGATIVE  PROTEINUR NEGATIVE 30*  UROBILINOGEN 0.2 0.2  NITRITE NEGATIVE NEGATIVE  LEUKOCYTESUR SMALL* LARGE*      Imaging: No results found.   Medications:   . 0.9 % NaCl  with KCl 20 mEq / L 50 mL/hr at 01/05/13 1940   . allopurinol  150 mg Oral Q breakfast  . amLODipine  10 mg Oral Q breakfast  . calcitRIOL  0.5 mcg Oral Q breakfast  . calcium carbonate  2 tablet Oral TID WC  . cefTRIAXone (ROCEPHIN)  IV  1 g Intravenous Q24H  . famotidine  20 mg Oral Daily  . fentaNYL  12.5 mcg Transdermal Q72H  . fentaNYL  50 mcg Transdermal Q72H  . gabapentin  300 mg Oral Daily  . heparin  5,000 Units Subcutaneous Q8H  . insulin aspart  0-20 Units Subcutaneous TID WC  . insulin aspart  0-5 Units Subcutaneous QHS  . insulin aspart  4 Units Subcutaneous TID WC  . insulin detemir  15 Units Subcutaneous Q breakfast  . insulin detemir  20 Units Subcutaneous QHS  . ketoconazole  1 application Topical BID  . labetalol  300 mg Oral TID  . latanoprost  1 drop Right Eye QHS  . levothyroxine  224 mcg Oral Q breakfast  . mycophenolate  500 mg Oral BID   . predniSONE  10 mg Oral Q breakfast  . 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

## 2013-01-06 NOTE — Progress Notes (Signed)
PT Cancellation Note  Patient Details Name: [name] MRN: [id number] DOB: October 24, 1940   Cancelled Treatment:    Reason Eval/Treat Not Completed: Medical issues which prohibited therapy Pt with blood sugar problems this am, this pm she defers "I'm just out of it today"   Donnetta Hail 01/06/2013, 4:10 PM

## 2013-01-06 NOTE — Progress Notes (Signed)
01/05/13 21:00 -Patient's CBG=416.Call made to Dr. Wylene Simmer and made aware of pt's cbg result.Order to give 5 units of novolog SQ. Alira Fretwell Joselita,RN

## 2013-01-06 NOTE — Clinical Social Work Note (Signed)
On 01/06/13 CSW followed up with patient and husband to give bed offers: [facility] (1st choice) said yes and call made to [facility] for their decision. During our conversation patient was not as expressive as she was on 3/18 and she kept her eyes closed most of the time CSW was in room. She did open her eyes while addressing CSW however. When asked about accepting bed (private room) at [facility], patient said that she was not going to a facility, but home. Mr. Revels expressed to wife that it would be good for her to go, but patient continued to say no.  Genelle Bal, MSW, LCSW [phone]

## 2013-01-06 NOTE — Progress Notes (Signed)
CBG checked per husband's request and got 450.Dr. Wylene Simmer notified,order received to give 10 units of novolog SQ. Nakeitha Milligan Joselita,RN

## 2013-01-06 NOTE — Progress Notes (Signed)
Subjective: BS's are way up over 400. Not eating more or anything from outside Still weak, no chills or sweats   Objective: Vital signs in last 24 hours: Temp:  [97.3 F (36.3 C)-99.5 F (37.5 C)] 97.3 F (36.3 C) (03/19 0520) Pulse Rate:  [74-86] 84 (03/19 0520) Resp:  [16-18] 16 (03/19 0520) BP: (113-157)/(49-66) 157/66 mmHg (03/19 0520) SpO2:  [91 %-99 %] 94 % (03/19 0520) Weight:  [63.1 kg (139 lb 1.8 oz)] 63.1 kg (139 lb 1.8 oz) (03/18 2042)  Intake/Output from previous day: 03/18 0701 - 03/19 0700 In: 1907.5 [P.O.:680; I.V.:1177.5; IV Piggyback:50] Out: 0  Intake/Output this shift:    General: alert a bit shaky. Lungs clear ht regular abd soft NT. Awake, weak  Lab Results   Recent Labs  01/04/13 1657 01/05/13 0545  WBC 8.7 6.7  RBC 4.44 4.60  HGB 12.2 12.3  HCT 38.1 38.4  MCV 85.8 83.5  MCH 27.5 26.7  RDW 18.1* 18.3*  PLT 196 183    Recent Labs  01/05/13 0545 01/05/13 1755 01/06/13 0545  NA 143  --  140  K 3.8  --  5.6*  CL 100  --  102  CO2 32  --  29  GLUCOSE 88 480* 441*  BUN 33*  --  44*  CREATININE 2.22*  --  2.33*  CALCIUM 9.0  --  8.6    Studies/Results: No results found.  Scheduled Meds: . allopurinol  150 mg Oral Q breakfast  . amLODipine  10 mg Oral Q breakfast  . calcitRIOL  0.5 mcg Oral Q breakfast  . calcium carbonate  2 tablet Oral TID WC  . cefTRIAXone (ROCEPHIN)  IV  1 g Intravenous Q24H  . famotidine  20 mg Oral Daily  . fentaNYL  12.5 mcg Transdermal Q72H  . fentaNYL  50 mcg Transdermal Q72H  . gabapentin  300 mg Oral Daily  . heparin  5,000 Units Subcutaneous Q8H  . insulin aspart  0-9 Units Subcutaneous TID WC  . insulin detemir  15 Units Subcutaneous Q breakfast  . ketoconazole  1 application Topical BID  . labetalol  300 mg Oral TID  . latanoprost  1 drop Right Eye QHS  . levothyroxine  224 mcg Oral Q breakfast  . methylPREDNISolone (SOLU-MEDROL) injection  40 mg Intravenous Q12H  . mycophenolate  500 mg Oral BID   . predniSONE  10 mg Oral Q breakfast  . sirolimus  3 mg Oral Daily   Continuous Infusions: . 0.9 % NaCl with KCl 20 mEq / L 50 mL/hr at 01/05/13 1940   PRN Meds:acetaminophen, acetaminophen  Assessment/Plan: 1. Confusion/AMS/Lethargy due to recurrent UTI/S/P Bladder Diverticulum surgery (Dr Mena Goes) 05/2012 On abx, await cx  -:  2. AMS/Metabolic Encephalopathy- improvied 3. Diabetes mellitus type 2, uncontrolled-FBS way up. Home dose of levemir is 28. Will stop IV steroids  4. Hypertension- improved  5. Chronic kidney disease, stage III (moderate) in pt with H/O Kidney transplant - continue anti-rejection drugs. Doing a little worse 6 PT/OT/CW for Adult FTT/Weakness and deconditioning.  7 DVT Proph ordered.  8 Hypokalemia: overshot at 5.6. Stop Rx  9. On chronic Prednisone - bump the dose for stress dose and it will help with Hypoglycemia..  10.Hypothyroid: check TSH  11. DM neuropathy: on Rx     LOS: 2 days   [name] [name] 01/06/2013, 8:37 AM

## 2013-01-07 LAB — BASIC METABOLIC PANEL
BUN: 53 mg/dL — ABNORMAL HIGH (ref 6–23)
CO2: 29 mEq/L (ref 19–32)
Calcium: 9.3 mg/dL (ref 8.4–10.5)
GFR calc non Af Amer: 18 mL/min — ABNORMAL LOW (ref >90)
Glucose, Bld: 268 mg/dL — ABNORMAL HIGH (ref 70–99)
Potassium: 4.5 mEq/L (ref 3.5–5.1)
Sodium: 142 mEq/L (ref 135–145)

## 2013-01-07 LAB — GLUCOSE, CAPILLARY
Glucose-Capillary: 229 mg/dL — ABNORMAL HIGH (ref 70–99)
Glucose-Capillary: 125 mg/dL — ABNORMAL HIGH (ref 70–99)

## 2013-01-07 LAB — URINE CULTURE

## 2013-01-07 MED ORDER — VANCOMYCIN HCL IN DEXTROSE 1-5 GM/200ML-% IV SOLN
1000.0000 mg | Freq: Once | INTRAVENOUS | Status: AC
  Administered 2013-01-07: 1000 mg via INTRAVENOUS
  Filled 2013-01-07 (×2): qty 200

## 2013-01-07 MED ORDER — INSULIN DETEMIR 100 UNIT/ML ~~LOC~~ SOLN
24.0000 [IU] | Freq: Every day | SUBCUTANEOUS | Status: DC
  Administered 2013-01-07: 24 [IU] via SUBCUTANEOUS
  Filled 2013-01-07 (×2): qty 0.24

## 2013-01-07 NOTE — Care Management Note (Signed)
   CARE MANAGEMENT NOTE 01/07/2013  Patient:  [name], [name]   Account Number:  [id number]  Date Initiated:  01/05/2013  Documentation initiated by:  Antawn Sison  Subjective/Objective Assessment:   Consult for [facility] needs.     Action/Plan:   Await PT/OT eval and recommendations.  01/07/2013 Plan is for d/c to [facility].   Anticipated DC Date:  01/08/2013   Anticipated DC Plan:  SKILLED NURSING FACILITY         Choice offered to / List presented to:             Status of service:  Completed, signed off Medicare Important Message given?   (If response is "NO", the following Medicare IM given date fields will be blank) Date Medicare IM given:   Date Additional Medicare IM given:    Discharge Disposition:  SKILLED NURSING FACILITY  Per UR Regulation:    If discussed at Long Length of Stay Meetings, dates discussed:    Comments:

## 2013-01-07 NOTE — Progress Notes (Signed)
OT Cancellation Note  Patient Details Name: [name] MRN: [id number] DOB: February 04, 1941   Cancelled Treatment:    Reason Eval/Treat Not Completed:  (Pt politely declining saying she doesn't feel like therapy)  Evette Georges [phone] 01/07/2013, 3:50 PM

## 2013-01-07 NOTE — Progress Notes (Signed)
PT Cancellation Note  Patient Details Name: [name] MRN: [id number] DOB: 23-Feb-1941   Cancelled Treatment:    Reason Eval/Treat Not Completed:  (pt just back in bed. defers PT at this time) Pt has just walked around the bed and got back into bed per pt and husband.  She does not want to get up again or even do exercise.  She said she will be going to rehab before she goes home and asks that we come back tomorrow   Donnetta Hail 01/07/2013, 2:44 PM

## 2013-01-07 NOTE — Clinical Social Work Note (Signed)
Patient has now decided to go to a SNF for short term rehab and her first choice remains [facility]. CSW intern contacted Bjorn Loser with [facility] regarding possible discharge today to confirm private room availability. CSW intern was advised that a semi-private room is available today, and a private room will be available on 01/08/2013. CSW intern later advised by MD that patient is not ready for discharge today and facility notified.   Fernande Boyden, Social Work Intern 01/07/2013  Co-sign - Genelle Bal, LCSW

## 2013-01-07 NOTE — Progress Notes (Signed)
Subjective: Was still confused last night. Better this AM. BS's somewhat better. Still very weak and husband feels she needs SNF. Cx show enterococcus.   Objective: Vital signs in last 24 hours: Temp:  [97.3 F (36.3 C)-98.3 F (36.8 C)] 97.7 F (36.5 C) (03/20 0908) Pulse Rate:  [77-84] 79 (03/20 0908) Resp:  [16-18] 18 (03/20 0908) BP: (130-163)/(40-72) 163/62 mmHg (03/20 0908) SpO2:  [94 %-98 %] 98 % (03/20 0908) Weight:  [67.359 kg (148 lb 8 oz)] 67.359 kg (148 lb 8 oz) (03/19 2054)  Intake/Output from previous day: 03/19 0701 - 03/20 0700 In: 1030.8 [P.O.:480; I.V.:550.8] Out: 250 [Urine:250] Intake/Output this shift:    General: fatigued a bit more left eye swelling. Lungs clear. Ht regular with sys murmur . abd soft NT. extrems trace edema. Mentation fair, a bit slow in speech. Some tremor  Lab Results   Recent Labs  01/04/13 1657 01/05/13 0545  WBC 8.7 6.7  RBC 4.44 4.60  HGB 12.2 12.3  HCT 38.1 38.4  MCV 85.8 83.5  MCH 27.5 26.7  RDW 18.1* 18.3*  PLT 196 183    Recent Labs  01/06/13 0545 01/07/13 0430  NA 140 142  K 5.6* 4.5  CL 102 103  CO2 29 29  GLUCOSE 441* 268*  BUN 44* 53*  CREATININE 2.33* 2.50*  CALCIUM 8.6 9.3    Studies/Results: No results found.  Scheduled Meds: . allopurinol  150 mg Oral Q breakfast  . amLODipine  10 mg Oral Q breakfast  . calcitRIOL  0.5 mcg Oral Q breakfast  . calcium carbonate  2 tablet Oral TID WC  . cefTRIAXone (ROCEPHIN)  IV  1 g Intravenous Q24H  . famotidine  20 mg Oral Daily  . fentaNYL  12.5 mcg Transdermal Q72H  . fentaNYL  50 mcg Transdermal Q72H  . gabapentin  300 mg Oral Daily  . heparin  5,000 Units Subcutaneous Q8H  . insulin aspart  0-20 Units Subcutaneous TID WC  . insulin aspart  0-5 Units Subcutaneous QHS  . insulin aspart  4 Units Subcutaneous TID WC  . insulin detemir  15 Units Subcutaneous Q breakfast  . insulin detemir  20 Units Subcutaneous QHS  . ketoconazole  1 application Topical  BID  . labetalol  300 mg Oral TID  . latanoprost  1 drop Right Eye QHS  . levothyroxine  224 mcg Oral Q breakfast  . mycophenolate  500 mg Oral BID  . predniSONE  10 mg Oral Q breakfast  . sirolimus  3 mg Oral Daily   Continuous Infusions: . 0.9 % NaCl with KCl 20 mEq / L 50 mL/hr at 01/06/13 1734   PRN Meds:acetaminophen, acetaminophen  Assessment/Plan:  1. Confusion/AMS/Lethargy due to recurrent UTI/S/P Bladder Diverticulum surgery (Dr Mena Goes) 05/2012 . Has enterococcus in urine. Can Rx nitrofurantoin.     -: 2. AMS/Metabolic Encephalopathy- fair  3. Diabetes mellitus type 2, uncontrolled-FBS way up. Home dose of levemir is 28.sl better, increase Rx  4. Hypertension- improved  5. Chronic kidney disease, stage III (moderate) in pt with H/O Kidney transplant - continue anti-rejection drugs. Doing a little worse to 2.50, increase fluids 6 PT/OT/CW for Adult FTT/Weakness and deconditioning. , needs SNF 7 DVT Proph ordered.  8 Hypokalemia: better  9. On chronic Prednisone on  Oral; 10.Hypothyroid: Results for DENISE, WASHBURN (MRN [id number]) as of 01/07/2013 09:58  Ref. Range 01/05/2013 08:18  TSH Latest Range: 0.350-4.500 uIU/mL 0.511    11. DM neuropathy: on Rx  LOS: 3 days   Terricka Onofrio [name] 01/07/2013, 9:55 AM

## 2013-01-07 NOTE — Progress Notes (Signed)
Abx consult:  Pt with enterococcal UTI. She did received a dose of vanc on admission. Then she was started on rocephin due to allergies other PCN. Unfortunately, rocephin doesn't cover enterococcus. D/w Dr. Felipa Eth, we can give her another dose of vanc and it should last for several more days in her system due to her poor renal function. Between the two doses, it should give her around 7 days of abx. (FYI - macrodantin would not work for her since her renal function is so poor)  Plan  Vanc 1g IV x1

## 2013-01-08 LAB — COMPREHENSIVE METABOLIC PANEL
ALT: 18 U/L (ref 0–35)
AST: 20 U/L (ref 0–37)
Alkaline Phosphatase: 47 U/L (ref 39–117)
Calcium: 9.2 mg/dL (ref 8.4–10.5)
Potassium: 4.4 mEq/L (ref 3.5–5.1)
Sodium: 141 mEq/L (ref 135–145)
Total Protein: 6.5 g/dL (ref 6.0–8.3)

## 2013-01-08 LAB — CBC
HCT: 35.9 % — ABNORMAL LOW (ref 36.0–46.0)
Hemoglobin: 11.6 g/dL — ABNORMAL LOW (ref 12.0–15.0)
MCH: 26.8 pg (ref 26.0–34.0)
MCHC: 32.3 g/dL (ref 30.0–36.0)
MCV: 82.9 fL (ref 78.0–100.0)
RDW: 17.7 % — ABNORMAL HIGH (ref 11.5–15.5)

## 2013-01-08 MED ORDER — NITROFURANTOIN MONOHYD MACRO 100 MG PO CAPS: 100.0000 mg | ORAL_CAPSULE | Freq: Two times a day (BID) | ORAL | Status: DC

## 2013-01-08 MED ORDER — ALLOPURINOL 150 MG HALF TABLET: 150.0000 mg | ORAL_TABLET | Freq: Every day | ORAL | Status: DC

## 2013-01-08 MED ORDER — INSULIN DETEMIR 100 UNIT/ML ~~LOC~~ SOLN: 24.0000 [IU] | Freq: Every day | SUBCUTANEOUS | Status: DC

## 2013-01-08 NOTE — Progress Notes (Signed)
Hypoglycemic Event  CBG: 57  Treatment: 15 GM carbohydrate snack  Symptoms: None  Follow-up CBG: Time:0819 CBG Result:72  Possible Reasons for Event: Medication regimen: Levemir  Comments/MD notified:Dr [place], Rosanna Randy  Remember to initiate Hypoglycemia Order Set & complete

## 2013-01-08 NOTE — Discharge Summary (Signed)
DISCHARGE SUMMARY  [name]  MR#: [id number]  DOB:Oct 27, 1940  Date of Admission: 01/04/2013 Date of Discharge: 01/08/2013  Attending Physician:Alaiah Lundy [name]  Patient's EXB:[id number] Erika Diener, MD  Consults:Treatment Team:  Garnetta Buddy, MD and  Discharge Diagnoses: Principal Problem:   UTI (lower urinary tract infection), enterococcus Active Problems:   Hypertension, stable   Chronic kidney disease, stage III (moderate) History renal transplant with creatinine stable at 2.2   Diabetes mellitus type 2, uncontrolled, improved, insulin requiring   Dehydration, resolved   Weakness, requiring SNF stay Altered mental status, improved Gout, on prophylaxis Diabetic neuropathy Chronic pain, medications Secondary hyperparathyroidism, on calcitriol Gastroesophageal reflux, on therapy   Discharge Medications:   Medication List    STOP taking these medications       ciprofloxacin 500 MG tablet  Commonly known as:  CIPRO      TAKE these medications       allopurinol 150 mg Tabs  Commonly known as:  ZYLOPRIM  Take 0.5 tablets (150 mg total) by mouth daily with breakfast.     amLODipine 10 MG tablet  Commonly known as:  NORVASC  Take 10 mg by mouth daily with breakfast.     calcitRIOL 0.5 MCG capsule  Commonly known as:  ROCALTROL  Take 0.5 mcg by mouth daily with breakfast.     calcium carbonate 500 MG chewable tablet  Commonly known as:  TUMS - dosed in mg elemental calcium  Chew 2 tablets by mouth 3 (three) times daily with meals.     fentaNYL 50 MCG/HR  Commonly known as:  DURAGESIC - dosed mcg/hr  Place 1 patch onto the skin every 3 (three) days. New patch was applied on Thursday     fentaNYL 12 MCG/HR  Commonly known as:  DURAGESIC - dosed mcg/hr  Place 1 patch onto the skin every 3 (three) days. New patch was applied on Thursday     furosemide 40 MG tablet  Commonly known as:  LASIX  Take 40 mg by mouth 2 (two) times daily.     gabapentin 300  MG capsule  Commonly known as:  NEURONTIN  Take 300 mg by mouth 3 (three) times daily.     insulin detemir 100 UNIT/ML injection  Commonly known as:  LEVEMIR  Inject 0.24 mLs (24 Units total) into the skin daily with breakfast.     insulin lispro 100 UNIT/ML injection  Commonly known as:  HUMALOG  Inject 3-12 Units into the skin 3 (three) times daily before meals. Pt is on a sliding scale     ketoconazole 2 % cream  Commonly known as:  NIZORAL  Apply 1 application topically 2 (two) times daily.     labetalol 200 MG tablet  Commonly known as:  NORMODYNE  Take 300 mg by mouth 3 (three) times daily. Takes 1 and 1/2 tabs     latanoprost 0.005 % ophthalmic solution  Commonly known as:  XALATAN  Place 1 drop into the right eye at bedtime.     levothyroxine 112 MCG tablet  Commonly known as:  SYNTHROID, LEVOTHROID  Take 224 mcg by mouth daily with breakfast.     mycophenolate 250 MG capsule  Commonly known as:  CELLCEPT  Take 500 mg by mouth 2 (two) times daily.     nitrofurantoin (macrocrystal-monohydrate) 100 MG capsule  Commonly known as:  MACROBID  Take 1 capsule (100 mg total) by mouth 2 (two) times daily.     potassium chloride SA 20 MEQ tablet  Commonly known as:  K-DUR,KLOR-CON  Take 20 mEq by mouth 2 (two) times daily.     predniSONE 5 MG tablet  Commonly known as:  DELTASONE  Take 10 mg by mouth daily with breakfast.     ranitidine 150 MG tablet  Commonly known as:  ZANTAC  Take 150 mg by mouth daily.     sirolimus 1 MG tablet  Commonly known as:  RAPAMUNE  Take 3 mg by mouth daily. Pt takes three 1 mg tablets = 3 mg daily     urea 40 % Crea  Commonly known as:  CARMOL  Apply 1 application topically 2 (two) times daily. Apply to finger nails, and cuticles.        Hospital Procedures: Dg Chest 2 View  01/04/2013  *RADIOLOGY REPORT*  Clinical Data: Fatigue.  Generalized weakness.  Shortness of breath.  CHEST - 2 VIEW  Comparison: Two-view chest x-ray  08/22/2012, 06/27/2012, 04/30/2012.  Findings: Cardiac silhouette moderately enlarged but stable. Pulmonary vascularity normal without evidence of pulmonary edema. Stable chronic elevation of the right hemidiaphragm.  No confluent airspace consolidation.  No pleural effusions.  Degenerative changes involving the thoracic spine and the right shoulder joint.  IMPRESSION: Stable cardiomegaly.  No acute cardiopulmonary disease.   Original Report Authenticated By: Hulan Saas, M.D.     History of Present Illness: Confusion  Hospital Course:  This is 72 year old white female with a history recurrent UTIs as well as multiple chronic issues including prior renal transplant, with  diabetes, and chronic pain. She presented in her typical fashion with the urinary tract infection with altered mental status. She had volume deficits the repleted. She's treated with broad-spectrum antibiotics and the cultures returned showing enterococcus. She's now been transitioned to oral therapy. Her blood sugars have been variable as they often are in the hospital. This morning's fasting sugar is 83. She's had prior severe lows with encephalopathy so we try not to run her sugars too tightly. Her blood pressure is been stable. Her oral intake has been good. Her bowels been mildly soft but showing no diarrhea. Mobilizations been difficult and she's weak just getting to the bathroom. Due to this we are going to have her have a skilled nursing stay for attempted rehabilitation and improvement in strength. Her chronic pain has been well-controlled. Her creatinine is subtle down back to his baseline of around 2. She has some minor anemia. She should be placed back on long-term ciprofloxacin as prophylaxis after her nitrofurantoin is completed. Day of Discharge Exam BP 162/64  Pulse 73  Temp(Src) 98.9 F (37.2 C) (Oral)  Resp 18  Ht 5\' 4"  (1.626 m)  Wt 69.582 kg (153 lb 6.4 oz)  BMI 26.32 kg/m2  SpO2 95%  Physical  Exam: General appearance: fatigued, with left facial swelling is chronic. Oral membranes are moist. Eyes: no scleral icterus   Resp: clear to auscultation bilaterally, with no wheezes rales or rhonchi Cardio: regular rate and rhythm , with systolic murmur GI: Obese soft, non-tender; bowel sounds normal; no masses,  no organomegaly, with healed scars Extremities: Trace edema with reasonably good pulses Neuro: Patient's awake alert with clear speech. She is mentating fairly well. She has a trace of tremor. Discharge Labs:  Recent Labs  01/07/13 0430 01/08/13 0530  NA 142 141  K 4.5 4.4  CL 103 104  CO2 29 26  GLUCOSE 268* 83  BUN 53* 46*  CREATININE 2.50* 2.12*  CALCIUM 9.3 9.2    Recent Labs  01/06/13 0545 01/08/13 0530  AST 11 20  ALT 7 18  ALKPHOS 49 47  BILITOT 0.3 0.3  PROT 6.5 6.5  ALBUMIN 3.0* 2.9*    Recent Labs  01/08/13 0530  WBC 5.1  HGB 11.6*  HCT 35.9*  MCV 82.9  PLT 207  Specimen Description    URINE, RANDOM    Special Requests    NONE    Culture  Setup Time    01/05/2013 06:31    Colony Count    >=100,000 COLONIES/ML    Culture    ENTEROCOCCUS SPECIES    Report Status    01/06/2013 FINAL    Organism ID, Bacteria    ENTEROCOCCUS SPECIES     Culture & Susceptibility    ENTEROCOCCUS SPECIES    Antibiotic Sensitivity Microscan Status    AMPICILLIN Sensitive <=2 SENSITIVE Final    Method: MIC    LEVOFLOXACIN Resistant >=8 RESISTANT Final    Method: MIC    NITROFURANTOIN Sensitive <=16 SENSITIVE Final    Method: MIC    TETRACYCLINE Resistant >=16 RESISTANT Final    Method: MIC    VANCOMYCIN Sensitive 1 SENSITIVE Final    Method: MIC    Comments ENTEROCOCCUS SPECIES (MIC)    ENTEROCOCCUS SPECIES     Results for [name], [name] (MRN [id number]) as of 01/08/2013 07:29  Ref. Range 12/11/2012 11:00  Sirolimus (Rapamycin) Latest Range: Not Established  7.6    s  Recent Labs  01/05/13 0818  TSH 0.511     Discharge instructions:       Future Appointments Provider Department Dept Phone   01/12/2013 10:30 AM [facility] Injection Room [facility] [phone]      Disposition:home  Follow-up Appts: Follow-up with Dr. Evlyn Kanner at [facility]2 weeks after SNF discharge.  Call for appointment.  Condition on Discharge: improved  Tests Needing Follow-up: none  Signed: Julieann Drummonds [name] 01/08/2013, 7:29 AM

## 2013-01-08 NOTE — Progress Notes (Signed)
Report called to Chyrl Civatte, LPN at [facility]. Pt prepared for discharge. IV and tele removed. Pt remains stable. Awaiting EMS transport to nursing home. [place], Rosanna Randy

## 2013-01-10 LAB — CULTURE, BLOOD (ROUTINE X 2)

## 2013-01-11 ENCOUNTER — Other Ambulatory Visit (HOSPITAL_COMMUNITY): Payer: Self-pay | Admitting: *Deleted

## 2013-01-12 ENCOUNTER — Encounter (HOSPITAL_COMMUNITY)
Admission: RE | Admit: 2013-01-12 | Discharge: 2013-01-12 | Disposition: A | Discharge status: Home / Self Care | Payer: Medicare Other | Source: Ambulatory Visit | Attending: Nephrology | Admitting: Nephrology

## 2013-01-12 LAB — IRON AND TIBC
Iron: 44 ug/dL (ref 42–135)
Saturation Ratios: 20 % (ref 20–55)
TIBC: 215 ug/dL — ABNORMAL LOW (ref 250–470)
UIBC: 171 ug/dL (ref 125–400)

## 2013-01-12 LAB — FERRITIN: Ferritin: 939 ng/mL — ABNORMAL HIGH (ref 10–291)

## 2013-01-12 LAB — COMPREHENSIVE METABOLIC PANEL
AST: 13 U/L (ref 0–37)
Albumin: 3.4 g/dL — ABNORMAL LOW (ref 3.5–5.2)
Calcium: 9.6 mg/dL (ref 8.4–10.5)
Creatinine, Ser: 1.68 mg/dL — ABNORMAL HIGH (ref 0.50–1.10)

## 2013-01-12 LAB — SIROLIMUS LEVEL: Sirolimus (Rapamycin): 6.2

## 2013-01-12 LAB — PHOSPHORUS: Phosphorus: 3.8 mg/dL (ref 2.3–4.6)

## 2013-01-12 LAB — POCT HEMOGLOBIN-HEMACUE: Hemoglobin: 12.4 g/dL (ref 12.0–15.0)

## 2013-01-12 MED ORDER — EPOETIN ALFA 10000 UNIT/ML IJ SOLN: 5000.0000 [IU] | INTRAMUSCULAR | Status: DC

## 2013-01-15 ENCOUNTER — Non-Acute Institutional Stay (INDEPENDENT_AMBULATORY_CARE_PROVIDER_SITE_OTHER): Payer: Self-pay | Admitting: Internal Medicine

## 2013-01-15 DIAGNOSIS — I10 Essential (primary) hypertension: Secondary | ICD-10-CM

## 2013-01-15 DIAGNOSIS — G9341 Metabolic encephalopathy: Secondary | ICD-10-CM

## 2013-01-15 DIAGNOSIS — R5383 Other fatigue: Secondary | ICD-10-CM

## 2013-01-15 DIAGNOSIS — R531 Weakness: Secondary | ICD-10-CM

## 2013-01-15 DIAGNOSIS — N3000 Acute cystitis without hematuria: Secondary | ICD-10-CM

## 2013-01-15 DIAGNOSIS — E1165 Type 2 diabetes mellitus with hyperglycemia: Secondary | ICD-10-CM

## 2013-01-15 DIAGNOSIS — F03918 Unspecified dementia, unspecified severity, with other behavioral disturbance: Secondary | ICD-10-CM

## 2013-01-15 DIAGNOSIS — N289 Disorder of kidney and ureter, unspecified: Secondary | ICD-10-CM

## 2013-01-15 DIAGNOSIS — F0391 Unspecified dementia with behavioral disturbance: Secondary | ICD-10-CM

## 2013-01-15 DIAGNOSIS — IMO0002 Reserved for concepts with insufficient information to code with codable children: Secondary | ICD-10-CM

## 2013-01-15 DIAGNOSIS — N189 Chronic kidney disease, unspecified: Secondary | ICD-10-CM

## 2013-01-15 NOTE — Progress Notes (Signed)
Erroneous note entry area please see note under Dr. Murray Hodgkins on 01/15/2013  Date: 01/15/2013  MRN:  [id number] Name:  [name] Sex:  female Age:  72 y.o. DOB:1941/04/01   [facility] #:    [id number]                   Facility/Room;[facility] Level Of Care:SNF   Provider: Dr Murray Hodgkins  Emergency Contacts: Contact Information   Name Relation Home Work Mobile   [name] Spouse [phone]     Franks,Vanessa Daughter [phone]  [phone]      Code Status:Full  MOST Form:  Allergies: Allergies  Allergen Reactions  . Adhesive (Tape)     Skin irritation, "pulls the skin off"   . Aspirin     Crawling feeling under skin  . Hydrocodone Other (See Comments)    Hallucinations   . Iohexol      Desc: pt unable to tell what the reaction was   . Oxycodone     hallucinations  . Penicillins Hives and Swelling    Has tolerated cefazolin and ceftriaxone.  . Tacrolimus Other (See Comments)    Hallucinations   . Contrast Media (Iodinated Diagnostic Agents) Hives, Swelling and Rash     Chief Complaint  Patient presents with  . Medical Managment of Chronic Issues    New admit to SNF following hospitalization to [facility]    Admitted to [facility] 01/04/13 to 01/08/2013   [name]:QQVZ is 72 year old white female with a history recurrent UTIs as well as multiple chronic issues including prior renal transplant, with  diabetes, and chronic pain. She presented in her typical fashion with the urinary tract infection with altered mental status. She had volume deficits the repleted. She's treated with broad-spectrum antibiotics and the cultures returned showing enterococcus. She's now been transitioned to oral therapy. Her blood sugars have been variable as they often are in the hospital. This morning's fasting sugar is 83. She's had prior severe lows with encephalopathy so we try not to run her sugars too tightly. Her blood pressure is been stable. Her oral intake has been good.  Her bowels been mildly soft but showing no diarrhea. Mobilizations been difficult and she's weak just getting to the bathroom. Due to this we are going to have her have a skilled nursing stay for attempted rehabilitation and improvement in strength. Her chronic pain has been well-controlled. Her creatinine is subtle down back to his baseline of around 2. She has some minor anemia. She should be placed back on long-term ciprofloxacin as prophylaxis after her nitrofurantoin is completed.    Past Medical History  Diagnosis Date  . Diabetes mellitus   . Diabetic neuropathy   . Hypothyroidism   . Hypertension   . GERD (gastroesophageal reflux disease)   . Heart murmur     not treated for  . Arthritis   . Chronic kidney disease     RENAL TRANSPLANT IN 2008--PT IS FOLLOWED BY DR. Karie Fetch WITH BUN OF 37 AND CREAT 1.7--PER NOTE DR. NESI FROM 04/11/12  . Urethral diverticulum   . Eyesight diminished     GLAUCOMA   . History of shingles     LEFT EYE JAN 2012--STILL HAS EYE PAIN-STATES NOT ABLE TO TAKE THE MEDICATION FOR SHINGLES BECAUSE OF HER HX OF KIDNEY TRANSPLANT  . Fingernail abnormalities     FUNGUS OF FINGERNAILS   UTI Dehydration Weakness Aletred mental stats, improved Gout, on prophylaxis  Past Surgical History  Procedure  Laterality Date  . Kidney transplant  2008    At [facility]  . Back surgery      ruptured disk  . Abdominal hysterectomy    . Appendectomy    . Laser surgery of both eyes for hemorrhages    . 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 Urethral diverticulectomy  04/28/2012    Procedure: URETHRAL DIVERTICULECTOMY;  Surgeon: Antony Haste, MD;  Location: [facility] ORS;  Service: Urology;  Laterality: N/A;     Procedures:Dg Chest 2 View  01/04/2013  *RADIOLOGY REPORT*  Clinical Data: Fatigue.  Generalized weakness.  Shortness of breath.  CHEST - 2 VIEW  Comparison: Two-view chest x-ray 08/22/2012, 06/27/2012, 04/30/2012.  Findings: Cardiac silhouette  moderately enlarged but stable. Pulmonary vascularity normal without evidence of pulmonary edema. Stable chronic elevation of the right hemidiaphragm.  No confluent airspace consolidation.  No pleural effusions.  Degenerative changes involving the thoracic spine and the right shoulder joint.  IMPRESSION: Stable cardiomegaly.  No acute cardiopulmonary disease.   Original Report Authenticated By: Hulan Saas, M.D.     Consultants:Patient's ZOX:[id number] [name], MD  Consults:Treatment Team:  Garnetta Buddy, MD and   Current Outpatient Prescriptions  Medication Sig Dispense Refill  . allopurinol (ZYLOPRIM) 150 mg TABS Take 0.5 tablets (150 mg total) by mouth daily with breakfast.  30 tablet  1  . amLODipine (NORVASC) 10 MG tablet Take 10 mg by mouth daily with breakfast.       . calcitRIOL (ROCALTROL) 0.5 MCG capsule Take 0.5 mcg by mouth daily with breakfast.       . calcium carbonate (TUMS - DOSED IN MG ELEMENTAL CALCIUM) 500 MG chewable tablet Chew 2 tablets by mouth 3 (three) times daily with meals.      . fentaNYL (DURAGESIC - DOSED MCG/HR) 12 MCG/HR Place 1 patch onto the skin every 3 (three) days. New patch was applied on Thursday      . fentaNYL (DURAGESIC - DOSED MCG/HR) 50 MCG/HR Place 1 patch onto the skin every 3 (three) days. New patch was applied on Thursday      . furosemide (LASIX) 40 MG tablet Take 40 mg by mouth 2 (two) times daily.      Marland Kitchen gabapentin (NEURONTIN) 300 MG capsule Take 300 mg by mouth 3 (three) times daily.      . insulin detemir (LEVEMIR) 100 UNIT/ML injection Inject 0.24 mLs (24 Units total) into the skin daily with breakfast.  10 mL  0  . insulin lispro (HUMALOG) 100 UNIT/ML injection Inject 3-12 Units into the skin 3 (three) times daily before meals. Pt is on a sliding scale      . ketoconazole (NIZORAL) 2 % cream Apply 1 application topically 2 (two) times daily.      Marland Kitchen labetalol (NORMODYNE) 200 MG tablet Take 300 mg by mouth 3 (three) times daily. Takes 1  and 1/2 tabs      . latanoprost (XALATAN) 0.005 % ophthalmic solution Place 1 drop into the right eye at bedtime.      Marland Kitchen levothyroxine (SYNTHROID, LEVOTHROID) 112 MCG tablet Take 224 mcg by mouth daily with breakfast.      . mycophenolate (CELLCEPT) 250 MG capsule Take 500 mg by mouth 2 (two) times daily.      . nitrofurantoin, macrocrystal-monohydrate, (MACROBID) 100 MG capsule Take 1 capsule (100 mg total) by mouth 2 (two) times daily.  10 capsule  0  . potassium chloride SA (K-DUR,KLOR-CON) 20 MEQ tablet Take 20 mEq  by mouth 2 (two) times daily.      . predniSONE (DELTASONE) 5 MG tablet Take 10 mg by mouth daily with breakfast.       . ranitidine (ZANTAC) 150 MG tablet Take 150 mg by mouth daily.      . sirolimus (RAPAMUNE) 1 MG tablet Take 3 mg by mouth daily. Pt takes three 1 mg tablets = 3 mg daily      . urea (CARMOL) 40 % CREA Apply 1 application topically 2 (two) times daily. Apply to finger nails, and cuticles.       No current facility-administered medications for this visit.   Facility-Administered Medications Ordered in Other Visits  Medication Dose Route Frequency Provider Last Rate Last Dose  . clindamycin (CLEOCIN) 2 % vaginal cream 1 Applicatorful  1 Applicatorful Vaginal QHS Antony Haste, MD        Immunization History  Administered Date(s) Administered  . Pneumococcal Conjugate 08/22/2011     Diet:  History  Substance Use Topics  . Smoking status: Former Smoker -- 0.50 packs/day for 20 years    Quit date: 10/21/1978  . Smokeless tobacco: Never Used  . Alcohol Use: No    Family History  Problem Relation Age of Onset  . Anesthesia problems Neg Hx        Vital signs: BP 136/71  Pulse 78  Resp 20  Ht 5\' 4"  (1.626 m)  Wt 149 lb (67.586 kg)  BMI 25.56 kg/m2   Screening Score  MMS    PHQ2    PHQ9     Fall Risk    BIMS    Annual summary: Hospitalizations:  Problem List as of 01/15/2013     ICD-9-CM   Heart murmur   Hypertension    Chronic kidney disease, stage III (moderate)   UTI (lower urinary tract infection)   Metabolic encephalopathy   Diabetes mellitus type 2, uncontrolled   Sepsis syndrome   Dehydration   Weakness     Infection History:  Functional assessment: Areas of potential improvement: Rehabilitation Potential: Prognosis for survival: Plan:

## 2013-01-25 ENCOUNTER — Other Ambulatory Visit (HOSPITAL_COMMUNITY): Payer: Self-pay | Admitting: *Deleted

## 2013-01-25 ENCOUNTER — Other Ambulatory Visit: Payer: Self-pay | Admitting: *Deleted

## 2013-01-25 MED ORDER — FENTANYL 12 MCG/HR TD PT72: MEDICATED_PATCH | TRANSDERMAL | Status: DC

## 2013-01-26 ENCOUNTER — Encounter (HOSPITAL_COMMUNITY)
Admission: RE | Admit: 2013-01-26 | Discharge: 2013-01-26 | Disposition: A | Discharge status: Home / Self Care | Payer: Medicare Other | Source: Ambulatory Visit | Attending: Nephrology | Admitting: Nephrology

## 2013-01-26 ENCOUNTER — Non-Acute Institutional Stay (SKILLED_NURSING_FACILITY): Payer: PRIVATE HEALTH INSURANCE | Admitting: Nurse Practitioner

## 2013-01-26 DIAGNOSIS — IMO0001 Reserved for inherently not codable concepts without codable children: Secondary | ICD-10-CM

## 2013-01-26 DIAGNOSIS — E1165 Type 2 diabetes mellitus with hyperglycemia: Secondary | ICD-10-CM

## 2013-01-26 DIAGNOSIS — E119 Type 2 diabetes mellitus without complications: Secondary | ICD-10-CM | POA: Insufficient documentation

## 2013-01-26 DIAGNOSIS — N183 Chronic kidney disease, stage 3 unspecified: Secondary | ICD-10-CM | POA: Insufficient documentation

## 2013-01-26 DIAGNOSIS — R6889 Other general symptoms and signs: Secondary | ICD-10-CM | POA: Insufficient documentation

## 2013-01-26 DIAGNOSIS — D638 Anemia in other chronic diseases classified elsewhere: Secondary | ICD-10-CM | POA: Insufficient documentation

## 2013-01-26 DIAGNOSIS — N2581 Secondary hyperparathyroidism of renal origin: Secondary | ICD-10-CM | POA: Insufficient documentation

## 2013-01-26 LAB — URINALYSIS, ROUTINE W REFLEX MICROSCOPIC
Glucose, UA: 100 mg/dL — AB
Hgb urine dipstick: NEGATIVE
Leukocytes, UA: NEGATIVE
Protein, ur: 30 mg/dL — AB
Specific Gravity, Urine: 1.016 (ref 1.005–1.030)
Urobilinogen, UA: 0.2 mg/dL (ref 0.0–1.0)

## 2013-01-26 LAB — URINE MICROSCOPIC-ADD ON

## 2013-01-26 LAB — PROTEIN / CREATININE RATIO, URINE
Creatinine, Urine: 120.62 mg/dL
Total Protein, Urine: 36.9 mg/dL

## 2013-01-26 LAB — POCT HEMOGLOBIN-HEMACUE: Hemoglobin: 13.1 g/dL (ref 12.0–15.0)

## 2013-01-26 MED ORDER — INSULIN DETEMIR 100 UNIT/ML ~~LOC~~ SOLN: 16.0000 [IU] | Freq: Every day | SUBCUTANEOUS | Status: DC

## 2013-01-26 MED ORDER — EPOETIN ALFA 10000 UNIT/ML IJ SOLN: 5000.0000 [IU] | INTRAMUSCULAR | Status: DC

## 2013-01-26 NOTE — Assessment & Plan Note (Signed)
Pt with episodes of hypoglycemia- on todays exam blood sugar was 36- hypoglycemia protocol initiated. Will decreased levimir to 16units daily- have staff hold all novolog for today and cont to monitor blood sugars closely. Also awaiting stat labs. ;to cont to monitor and call if needed for any changes in conditions

## 2013-01-26 NOTE — Progress Notes (Signed)
Patient ID: [name], female   DOB: Jan 15, 1941, 72 y.o.   MRN: [id number]  Chief Complaint: pt not well.   HPI:  72 year old female who has not been feeling well. STAT labs were ordered and drawn this am these have not been resulted yet. Pt has been more confused.  Review of Systems: difficult to obtain due to pts confusion Review of Systems  Constitutional: Positive for malaise/fatigue and diaphoresis. Negative for fever and chills.  Respiratory: Negative for cough and shortness of breath.   Cardiovascular: Negative for chest pain, palpitations and leg swelling.  Gastrointestinal: Negative for heartburn, nausea, vomiting, diarrhea and constipation.  Genitourinary: Negative for dysuria and urgency.  Musculoskeletal: Negative.   Skin: Negative.   Neurological: Positive for weakness. Negative for dizziness.     Medications: Patient's Medications  New Prescriptions   No medications on file  Previous Medications   ALLOPURINOL (ZYLOPRIM) 150 MG TABS    Take 0.5 tablets (150 mg total) by mouth daily with breakfast.   AMLODIPINE (NORVASC) 10 MG TABLET    Take 10 mg by mouth daily with breakfast.    CALCITRIOL (ROCALTROL) 0.5 MCG CAPSULE    Take 0.5 mcg by mouth daily with breakfast.    CALCIUM CARBONATE (TUMS - DOSED IN MG ELEMENTAL CALCIUM) 500 MG CHEWABLE TABLET    Chew 2 tablets by mouth 3 (three) times daily with meals.   FENTANYL (DURAGESIC - DOSED MCG/HR) 12 MCG/HR    Apply one patch every 6 days Rotate site   FENTANYL (DURAGESIC - DOSED MCG/HR) 50 MCG/HR    Place 1 patch onto the skin every 3 (three) days. New patch was applied on Thursday   FUROSEMIDE (LASIX) 40 MG TABLET    Take 40 mg by mouth 2 (two) times daily.   GABAPENTIN (NEURONTIN) 300 MG CAPSULE    Take 300 mg by mouth 3 (three) times daily.   INSULIN DETEMIR (LEVEMIR) 100 UNIT/ML INJECTION    Inject 0.24 mLs (24 Units total) into the skin daily with breakfast.   INSULIN LISPRO (HUMALOG) 100 UNIT/ML INJECTION    Inject  3-12 Units into the skin 3 (three) times daily before meals. Pt is on a sliding scale   KETOCONAZOLE (NIZORAL) 2 % CREAM    Apply 1 application topically 2 (two) times daily.   LABETALOL (NORMODYNE) 200 MG TABLET    Take 300 mg by mouth 3 (three) times daily. Takes 1 and 1/2 tabs   LATANOPROST (XALATAN) 0.005 % OPHTHALMIC SOLUTION    Place 1 drop into the right eye at bedtime.   LEVOTHYROXINE (SYNTHROID, LEVOTHROID) 112 MCG TABLET    Take 224 mcg by mouth daily with breakfast.   MYCOPHENOLATE (CELLCEPT) 250 MG CAPSULE    Take 500 mg by mouth 2 (two) times daily.   NITROFURANTOIN, MACROCRYSTAL-MONOHYDRATE, (MACROBID) 100 MG CAPSULE    Take 1 capsule (100 mg total) by mouth 2 (two) times daily.   POTASSIUM CHLORIDE SA (K-DUR,KLOR-CON) 20 MEQ TABLET    Take 20 mEq by mouth 2 (two) times daily.   PREDNISONE (DELTASONE) 5 MG TABLET    Take 10 mg by mouth daily with breakfast.    RANITIDINE (ZANTAC) 150 MG TABLET    Take 150 mg by mouth daily.   SIROLIMUS (RAPAMUNE) 1 MG TABLET    Take 3 mg by mouth daily. Pt takes three 1 mg tablets = 3 mg daily   UREA (CARMOL) 40 % CREA    Apply 1 application topically 2 (  two) times daily. Apply to finger nails, and cuticles.  Modified Medications   No medications on file  Discontinued Medications   No medications on file     Physical Exam: Physical Exam  Constitutional: She is well-developed, well-nourished, and in no distress.  Clammy   HENT:  Head: Normocephalic and atraumatic.  Eyes: Conjunctivae and EOM are normal. Pupils are equal, round, and reactive to light.  Neck: Normal range of motion. Neck supple.  Cardiovascular: Normal rate, regular rhythm and normal heart sounds.   Pulmonary/Chest: Effort normal and breath sounds normal.  Abdominal: Soft. Bowel sounds are normal.  Musculoskeletal: Normal range of motion.  Neurological:  Lethargic and confused    Skin: Skin is warm.     Filed Vitals:   01/19/13 1000  BP: 152/68  Pulse: 74  Temp:  98.9 F (37.2 C)  Resp: 20  SpO2: 97%      Labs reviewed: Basic Metabolic Panel:  Recent Labs  95/62/13 0445  10/23/12 1032 12/11/12 1100  01/07/13 0430 01/08/13 0530 01/12/13 1000  NA 139  < > 142 145  < > 142 141 138  K 3.7  < > 3.7 3.0*  < > 4.5 4.4 4.3  CL 96  < > 98 97  < > 103 104 96  CO2 37*  < > 30 36*  < > 29 26 31   GLUCOSE 159*  < > 269* 95  < > 268* 83 362*  BUN 34*  < > 40* 35*  < > 53* 46* 30*  CREATININE 1.95*  < > 1.90* 1.92*  < > 2.50* 2.12* 1.68*  CALCIUM 8.6  < > 9.4 9.3  < > 9.3 9.2 9.6  MG 2.1  --   --   --   --   --   --   --   PHOS 3.3  < > 3.7 4.1  --   --   --  3.8  < > = values in this interval not displayed.  Liver Function Tests:  Recent Labs  01/06/13 0545 01/08/13 0530 01/12/13 1000  AST 11 20 13   ALT 7 18 11   ALKPHOS 49 47 58  BILITOT 0.3 0.3 0.6  PROT 6.5 6.5 6.8  ALBUMIN 3.0* 2.9* 3.4*    CBC:  Recent Labs  06/02/12 1141  08/22/12 1116  01/04/13 1657 01/05/13 0545 01/08/13 0530 01/12/13 0953 01/26/13 0956  WBC 9.0  < > 6.1  < > 8.7 6.7 5.1  --   --   NEUTROABS 7.4  --  5.1  --  6.9  --   --   --   --   HGB 12.0  < > 12.2  < > 12.2 12.3 11.6* 12.4 13.1  HCT 39.9  < > 39.9  < > 38.1 38.4 35.9*  --   --   MCV 87.9  < > 86.0  < > 85.8 83.5 82.9  --   --   PLT 157  < > 141*  < > 196 183 207  --   --   < > = values in this interval not displayed.   Assessment/Plan Diabetes mellitus type 2, uncontrolled Pt with episodes of hypoglycemia- on todays exam blood sugar was 36- hypoglycemia protocol initiated. Will decreased levimir to 16units daily- have staff hold all novolog for today and cont to monitor blood sugars closely. Also awaiting stat labs. ;to cont to monitor and call if needed for any changes in conditions

## 2013-01-27 LAB — PTH, INTACT AND CALCIUM: Calcium, Total (PTH): 9.9 mg/dL (ref 8.4–10.5)

## 2013-02-09 ENCOUNTER — Encounter: Payer: Self-pay | Admitting: Nurse Practitioner

## 2013-02-09 ENCOUNTER — Non-Acute Institutional Stay (SKILLED_NURSING_FACILITY): Payer: Medicare Other | Admitting: Nurse Practitioner

## 2013-02-09 ENCOUNTER — Encounter (HOSPITAL_COMMUNITY): Payer: Medicare Other

## 2013-02-09 DIAGNOSIS — R5381 Other malaise: Secondary | ICD-10-CM

## 2013-02-09 DIAGNOSIS — E1165 Type 2 diabetes mellitus with hyperglycemia: Secondary | ICD-10-CM

## 2013-02-09 DIAGNOSIS — I10 Essential (primary) hypertension: Secondary | ICD-10-CM

## 2013-02-09 DIAGNOSIS — R531 Weakness: Secondary | ICD-10-CM

## 2013-02-10 ENCOUNTER — Encounter: Payer: Self-pay | Admitting: Nurse Practitioner

## 2013-02-10 MED ORDER — MYCOPHENOLATE MOFETIL 250 MG PO CAPS: 500.0000 mg | ORAL_CAPSULE | Freq: Two times a day (BID) | ORAL | Status: AC

## 2013-02-10 MED ORDER — INSULIN LISPRO 100 UNIT/ML ~~LOC~~ SOLN: 3.0000 [IU] | Freq: Three times a day (TID) | SUBCUTANEOUS | Status: DC

## 2013-02-10 MED ORDER — AMLODIPINE BESYLATE 10 MG PO TABS: 10.0000 mg | ORAL_TABLET | Freq: Every day | ORAL | Status: AC

## 2013-02-10 MED ORDER — FUROSEMIDE 40 MG PO TABS: 40.0000 mg | ORAL_TABLET | Freq: Two times a day (BID) | ORAL | Status: DC

## 2013-02-10 MED ORDER — LEVOTHYROXINE SODIUM 112 MCG PO TABS: 224.0000 ug | ORAL_TABLET | Freq: Every day | ORAL | Status: AC

## 2013-02-10 MED ORDER — RANITIDINE HCL 150 MG PO TABS: 150.0000 mg | ORAL_TABLET | Freq: Every day | ORAL | Status: DC

## 2013-02-10 MED ORDER — CALCITRIOL 0.5 MCG PO CAPS: 0.5000 ug | ORAL_CAPSULE | Freq: Every day | ORAL | Status: AC

## 2013-02-10 MED ORDER — LABETALOL HCL 200 MG PO TABS: 300.0000 mg | ORAL_TABLET | Freq: Three times a day (TID) | ORAL | Status: AC

## 2013-02-10 MED ORDER — INSULIN DETEMIR 100 UNIT/ML ~~LOC~~ SOLN: 22.0000 [IU] | Freq: Every day | SUBCUTANEOUS | Status: DC

## 2013-02-10 MED ORDER — POTASSIUM CHLORIDE CRYS ER 20 MEQ PO TBCR: 20.0000 meq | EXTENDED_RELEASE_TABLET | Freq: Two times a day (BID) | ORAL | Status: DC

## 2013-02-10 MED ORDER — CIPROFLOXACIN HCL 250 MG PO TABS: 250.0000 mg | ORAL_TABLET | Freq: Every day | ORAL | Status: DC

## 2013-02-10 MED ORDER — ALLOPURINOL 150 MG HALF TABLET: 150.0000 mg | ORAL_TABLET | Freq: Every day | ORAL | Status: DC

## 2013-02-10 MED ORDER — SIROLIMUS 1 MG PO TABS: 3.0000 mg | ORAL_TABLET | Freq: Every day | ORAL | Status: AC

## 2013-02-10 MED ORDER — PREDNISONE 5 MG PO TABS: 10.0000 mg | ORAL_TABLET | Freq: Every day | ORAL | Status: DC

## 2013-02-10 MED ORDER — LATANOPROST 0.005 % OP SOLN: 1.0000 [drp] | Freq: Every day | OPHTHALMIC | Status: DC

## 2013-02-10 MED ORDER — GABAPENTIN 300 MG PO CAPS: 300.0000 mg | ORAL_CAPSULE | Freq: Three times a day (TID) | ORAL | Status: DC

## 2013-02-10 MED ORDER — CALCIUM CARBONATE ANTACID 500 MG PO CHEW: 2.0000 | CHEWABLE_TABLET | Freq: Three times a day (TID) | ORAL | Status: DC

## 2013-02-10 NOTE — Assessment & Plan Note (Signed)
Diabetes very labile. Will cont SSI and levemir- to follow up with PCP

## 2013-02-10 NOTE — Assessment & Plan Note (Signed)
Renal transplant in 2008- followed by Eliott [facility]- currently on Cipro for UTI prophylaxis will need to cont close follow up as outpatient

## 2013-02-10 NOTE — Progress Notes (Signed)
Patient ID: [name], female   DOB: 05-13-1941, 72 y.o.   MRN: [id number]  Chief Complaint: evaluation for discharge   HPI:  This is 72 year old female with a history recurrent UTIs as well as multiple chronic issues including prior renal transplant, with diabetes, and chronic pain. She was hospitalized due to  urinary tract infection with altered mental status. Pt is currently on long-term cipro as prophylaxis. Her blood sugars were variable are in the hospital as they were also in the nursing facility and has had prior severe lows with encephalopathy so sugars are not managed too tightly. Mobilizations had been difficult and she's weak therefore she was transferred to [facility] for rehab. She is currently doing well with therapy and now ready to go home with home health services    Review of Systems:  Review of Systems  Constitutional: Negative for fever and chills.  HENT: Positive for neck pain.   Eyes: Negative.   Respiratory: Negative for cough, sputum production and shortness of breath.   Cardiovascular: Negative for chest pain and palpitations.  Gastrointestinal: Negative for abdominal pain, diarrhea and constipation.  Genitourinary: Negative for dysuria, urgency and frequency.  Musculoskeletal: Positive for myalgias and joint pain.       Chronic myalgias with right shoulder pain   Skin: Negative.   Neurological: Negative for dizziness.  Psychiatric/Behavioral: Negative for depression. The patient is not nervous/anxious and does not have insomnia.      Medications: Patient's Medications  New Prescriptions   No medications on file  Previous Medications   FENTANYL (DURAGESIC - DOSED MCG/HR) 12 MCG/HR    Apply one patch every 6 days Rotate site   FENTANYL (DURAGESIC - DOSED MCG/HR) 50 MCG/HR    Place 1 patch onto the skin every 3 (three) days. New patch was applied on Thursday   KETOCONAZOLE (NIZORAL) 2 % CREAM    Apply 1 application topically 2 (two) times daily.    NITROFURANTOIN, MACROCRYSTAL-MONOHYDRATE, (MACROBID) 100 MG CAPSULE    Take 1 capsule (100 mg total) by mouth 2 (two) times daily.   UREA (CARMOL) 40 % CREA    Apply 1 application topically 2 (two) times daily. Apply to finger nails, and cuticles.  Modified Medications   Modified Medication Previous Medication   ALLOPURINOL (ZYLOPRIM) 150 MG TABS allopurinol (ZYLOPRIM) 150 mg TABS      Take 0.5 tablets (150 mg total) by mouth daily with breakfast.    Take 0.5 tablets (150 mg total) by mouth daily with breakfast.   AMLODIPINE (NORVASC) 10 MG TABLET amLODipine (NORVASC) 10 MG tablet      Take 1 tablet (10 mg total) by mouth daily with breakfast.    Take 10 mg by mouth daily with breakfast.    CALCITRIOL (ROCALTROL) 0.5 MCG CAPSULE calcitRIOL (ROCALTROL) 0.5 MCG capsule      Take 1 capsule (0.5 mcg total) by mouth daily with breakfast.    Take 0.5 mcg by mouth daily with breakfast.    CALCIUM CARBONATE (TUMS - DOSED IN MG ELEMENTAL CALCIUM) 500 MG CHEWABLE TABLET calcium carbonate (TUMS - DOSED IN MG ELEMENTAL CALCIUM) 500 MG chewable tablet      Chew 2 tablets (400 mg of elemental calcium total) by mouth 3 (three) times daily with meals.    Chew 2 tablets by mouth 3 (three) times daily with meals.   CIPROFLOXACIN (CIPRO) 250 MG TABLET ciprofloxacin (CIPRO) 250 MG tablet      Take 1 tablet (250 mg total) by mouth daily.  Take 250 mg by mouth daily.   FUROSEMIDE (LASIX) 40 MG TABLET furosemide (LASIX) 40 MG tablet      Take 1 tablet (40 mg total) by mouth 2 (two) times daily.    Take 40 mg by mouth 2 (two) times daily.   GABAPENTIN (NEURONTIN) 300 MG CAPSULE gabapentin (NEURONTIN) 300 MG capsule      Take 1 capsule (300 mg total) by mouth 3 (three) times daily.    Take 300 mg by mouth 3 (three) times daily.   INSULIN DETEMIR (LEVEMIR) 100 UNIT/ML INJECTION insulin detemir (LEVEMIR) 100 UNIT/ML injection      Inject 0.22 mLs (22 Units total) into the skin daily with breakfast.    Inject 0.16 mLs (16  Units total) into the skin daily with breakfast.   INSULIN LISPRO (HUMALOG) 100 UNIT/ML INJECTION insulin lispro (HUMALOG) 100 UNIT/ML injection      Inject 3-12 Units into the skin 3 (three) times daily before meals. Pt is on a sliding scale    Inject 3-12 Units into the skin 3 (three) times daily before meals. Pt is on a sliding scale   LABETALOL (NORMODYNE) 200 MG TABLET labetalol (NORMODYNE) 200 MG tablet      Take 1.5 tablets (300 mg total) by mouth 3 (three) times daily. Takes 1 and 1/2 tabs    Take 300 mg by mouth 3 (three) times daily. Takes 1 and 1/2 tabs   LATANOPROST (XALATAN) 0.005 % OPHTHALMIC SOLUTION latanoprost (XALATAN) 0.005 % ophthalmic solution      Place 1 drop into the right eye at bedtime.    Place 1 drop into the right eye at bedtime.   LEVOTHYROXINE (SYNTHROID, LEVOTHROID) 112 MCG TABLET levothyroxine (SYNTHROID, LEVOTHROID) 112 MCG tablet      Take 2 tablets (224 mcg total) by mouth daily with breakfast.    Take 224 mcg by mouth daily with breakfast.   MYCOPHENOLATE (CELLCEPT) 250 MG CAPSULE mycophenolate (CELLCEPT) 250 MG capsule      Take 2 capsules (500 mg total) by mouth 2 (two) times daily.    Take 500 mg by mouth 2 (two) times daily.   POTASSIUM CHLORIDE SA (K-DUR,KLOR-CON) 20 MEQ TABLET potassium chloride SA (K-DUR,KLOR-CON) 20 MEQ tablet      Take 1 tablet (20 mEq total) by mouth 2 (two) times daily.    Take 20 mEq by mouth 2 (two) times daily.   PREDNISONE (DELTASONE) 5 MG TABLET predniSONE (DELTASONE) 5 MG tablet      Take 2 tablets (10 mg total) by mouth daily with breakfast.    Take 10 mg by mouth daily with breakfast.    RANITIDINE (ZANTAC) 150 MG TABLET ranitidine (ZANTAC) 150 MG tablet      Take 1 tablet (150 mg total) by mouth daily.    Take 150 mg by mouth daily.   SIROLIMUS (RAPAMUNE) 1 MG TABLET sirolimus (RAPAMUNE) 1 MG tablet      Take 3 tablets (3 mg total) by mouth daily. Pt takes three 1 mg tablets = 3 mg daily    Take 3 mg by mouth daily. Pt takes  three 1 mg tablets = 3 mg daily  Discontinued Medications   INSULIN DETEMIR (LEVEMIR) 100 UNIT/ML INJECTION    Inject 22 Units into the skin daily with breakfast.     Physical Exam: Physical Exam  Constitutional: No distress.  HENT:  Head: Normocephalic and atraumatic.  Right Ear: External ear normal.  Left Ear: External ear normal.  Eyes: EOM are  normal. Pupils are equal, round, and reactive to light.  Neck: Normal range of motion. Neck supple.  Cardiovascular: Normal rate, regular rhythm, normal heart sounds and intact distal pulses.   Pulmonary/Chest: Effort normal and breath sounds normal.  Abdominal: Soft. Bowel sounds are normal.  Musculoskeletal: She exhibits tenderness (right shoulder).  Neurological: She is alert.  Skin: Skin is warm and dry. She is not diaphoretic.  Psychiatric: She has a normal mood and affect.     Filed Vitals:   02/09/13 1322  BP: 139/74  Pulse: 82  Temp: 98.4 F (36.9 C)  Resp: 18      Labs reviewed: Basic Metabolic Panel:  Recent Labs  14/78/29 0445  10/23/12 1032 12/11/12 1100  01/07/13 0430 01/08/13 0530 01/12/13 1000 01/26/13 0949  NA 139  < > 142 145  < > 142 141 138  --   K 3.7  < > 3.7 3.0*  < > 4.5 4.4 4.3  --   CL 96  < > 98 97  < > 103 104 96  --   CO2 37*  < > 30 36*  < > 29 26 31   --   GLUCOSE 159*  < > 269* 95  < > 268* 83 362*  --   BUN 34*  < > 40* 35*  < > 53* 46* 30*  --   CREATININE 1.95*  < > 1.90* 1.92*  < > 2.50* 2.12* 1.68*  --   CALCIUM 8.6  < > 9.4 9.3  < > 9.3 9.2 9.6 9.9  MG 2.1  --   --   --   --   --   --   --   --   PHOS 3.3  < > 3.7 4.1  --   --   --  3.8  --   < > = values in this interval not displayed.  Liver Function Tests:  Recent Labs  01/06/13 0545 01/08/13 0530 01/12/13 1000  AST 11 20 13   ALT 7 18 11   ALKPHOS 49 47 58  BILITOT 0.3 0.3 0.6  PROT 6.5 6.5 6.8  ALBUMIN 3.0* 2.9* 3.4*    CBC:  Recent Labs  06/02/12 1141  08/22/12 1116  01/04/13 1657 01/05/13 0545  01/08/13 0530 01/12/13 0953 01/26/13 0956  WBC 9.0  < > 6.1  < > 8.7 6.7 5.1  --   --   NEUTROABS 7.4  --  5.1  --  6.9  --   --   --   --   HGB 12.0  < > 12.2  < > 12.2 12.3 11.6* 12.4 13.1  HCT 39.9  < > 39.9  < > 38.1 38.4 35.9*  --   --   MCV 87.9  < > 86.0  < > 85.8 83.5 82.9  --   --   PLT 157  < > 141*  < > 196 183 207  --   --   < > = values in this interval not displayed.   Assessment/Plan Diabetes mellitus type 2, uncontrolled Diabetes very labile. Will cont SSI and levemir- to follow up with PCP  Chronic kidney disease, stage III (moderate) Renal transplant in 2008- followed by [facility]- currently on Cipro for UTI prophylaxis will need to cont close follow up as outpatient    Weakness Ongoing somewhat improved from therapy-- now pt is stable for discharge-will need PT/OT/Nursing per home health. No DME needed. Rx sent to pharm via epic and fent rx  written.  will need to follow up with PCP within 2 weeks.     Hypertension Patient is stable; continue current regimen. Will monitor and make changes as necessary.

## 2013-02-10 NOTE — Assessment & Plan Note (Signed)
Patient is stable; continue current regimen. Will monitor and make changes as necessary.  

## 2013-02-10 NOTE — Assessment & Plan Note (Signed)
Ongoing somewhat improved from therapy-- now pt is stable for discharge-will need PT/OT/Nursing per home health. No DME needed. Rx sent to pharm via epic and fent rx written.  will need to follow up with PCP within 2 weeks.

## 2013-02-26 ENCOUNTER — Other Ambulatory Visit (HOSPITAL_COMMUNITY): Payer: Self-pay | Admitting: Urology

## 2013-02-26 DIAGNOSIS — N361 Urethral diverticulum: Secondary | ICD-10-CM

## 2013-03-05 ENCOUNTER — Ambulatory Visit (HOSPITAL_COMMUNITY)
Admission: RE | Admit: 2013-03-05 | Discharge: 2013-03-05 | Disposition: A | Discharge status: Home / Self Care | Payer: Medicare Other | Source: Ambulatory Visit | Attending: Urology | Admitting: Urology

## 2013-03-05 DIAGNOSIS — N361 Urethral diverticulum: Secondary | ICD-10-CM

## 2013-03-05 DIAGNOSIS — Z87440 Personal history of urinary (tract) infections: Secondary | ICD-10-CM | POA: Insufficient documentation

## 2013-03-05 DIAGNOSIS — N289 Disorder of kidney and ureter, unspecified: Secondary | ICD-10-CM | POA: Insufficient documentation

## 2013-03-05 DIAGNOSIS — Z940 Kidney transplant status: Secondary | ICD-10-CM | POA: Insufficient documentation

## 2013-05-18 ENCOUNTER — Inpatient Hospital Stay (HOSPITAL_COMMUNITY)
Admission: EM | Admit: 2013-05-18 | Discharge: 2013-05-24 | DRG: 689 | Disposition: A | Discharge status: Home Health | Payer: Medicare Other | Attending: Endocrinology | Admitting: Endocrinology

## 2013-05-18 ENCOUNTER — Emergency Department (HOSPITAL_COMMUNITY): Payer: Medicare Other

## 2013-05-18 ENCOUNTER — Encounter (HOSPITAL_COMMUNITY): Payer: Self-pay | Admitting: *Deleted

## 2013-05-18 DIAGNOSIS — R5383 Other fatigue: Secondary | ICD-10-CM

## 2013-05-18 DIAGNOSIS — N183 Chronic kidney disease, stage 3 unspecified: Secondary | ICD-10-CM | POA: Diagnosis present

## 2013-05-18 DIAGNOSIS — D649 Anemia, unspecified: Secondary | ICD-10-CM | POA: Diagnosis present

## 2013-05-18 DIAGNOSIS — R531 Weakness: Secondary | ICD-10-CM

## 2013-05-18 DIAGNOSIS — I10 Essential (primary) hypertension: Secondary | ICD-10-CM | POA: Diagnosis present

## 2013-05-18 DIAGNOSIS — R5381 Other malaise: Secondary | ICD-10-CM | POA: Diagnosis present

## 2013-05-18 DIAGNOSIS — G8929 Other chronic pain: Secondary | ICD-10-CM | POA: Diagnosis present

## 2013-05-18 DIAGNOSIS — Z79899 Other long term (current) drug therapy: Secondary | ICD-10-CM

## 2013-05-18 DIAGNOSIS — E1165 Type 2 diabetes mellitus with hyperglycemia: Secondary | ICD-10-CM | POA: Diagnosis present

## 2013-05-18 DIAGNOSIS — G9341 Metabolic encephalopathy: Secondary | ICD-10-CM | POA: Diagnosis present

## 2013-05-18 DIAGNOSIS — E1149 Type 2 diabetes mellitus with other diabetic neurological complication: Secondary | ICD-10-CM | POA: Diagnosis present

## 2013-05-18 DIAGNOSIS — E039 Hypothyroidism, unspecified: Secondary | ICD-10-CM | POA: Diagnosis present

## 2013-05-18 DIAGNOSIS — R509 Fever, unspecified: Secondary | ICD-10-CM

## 2013-05-18 DIAGNOSIS — K219 Gastro-esophageal reflux disease without esophagitis: Secondary | ICD-10-CM | POA: Diagnosis present

## 2013-05-18 DIAGNOSIS — Z880 Allergy status to penicillin: Secondary | ICD-10-CM

## 2013-05-18 DIAGNOSIS — Z940 Kidney transplant status: Secondary | ICD-10-CM

## 2013-05-18 DIAGNOSIS — M549 Dorsalgia, unspecified: Secondary | ICD-10-CM | POA: Diagnosis present

## 2013-05-18 DIAGNOSIS — E1142 Type 2 diabetes mellitus with diabetic polyneuropathy: Secondary | ICD-10-CM | POA: Diagnosis present

## 2013-05-18 DIAGNOSIS — I129 Hypertensive chronic kidney disease with stage 1 through stage 4 chronic kidney disease, or unspecified chronic kidney disease: Secondary | ICD-10-CM | POA: Diagnosis present

## 2013-05-18 DIAGNOSIS — E441 Mild protein-calorie malnutrition: Secondary | ICD-10-CM | POA: Diagnosis present

## 2013-05-18 DIAGNOSIS — N2581 Secondary hyperparathyroidism of renal origin: Secondary | ICD-10-CM | POA: Diagnosis present

## 2013-05-18 DIAGNOSIS — Z794 Long term (current) use of insulin: Secondary | ICD-10-CM

## 2013-05-18 DIAGNOSIS — H05229 Edema of unspecified orbit: Secondary | ICD-10-CM | POA: Diagnosis present

## 2013-05-18 DIAGNOSIS — Z87891 Personal history of nicotine dependence: Secondary | ICD-10-CM

## 2013-05-18 DIAGNOSIS — E876 Hypokalemia: Secondary | ICD-10-CM | POA: Diagnosis present

## 2013-05-18 DIAGNOSIS — N390 Urinary tract infection, site not specified: Secondary | ICD-10-CM | POA: Diagnosis present

## 2013-05-18 HISTORY — DX: Urinary tract infection, site not specified: N39.0

## 2013-05-18 LAB — URINALYSIS, ROUTINE W REFLEX MICROSCOPIC
Bilirubin Urine: NEGATIVE
Glucose, UA: 250 mg/dL — AB
Hgb urine dipstick: NEGATIVE
Ketones, ur: NEGATIVE mg/dL
Nitrite: NEGATIVE
Specific Gravity, Urine: 1.014 (ref 1.005–1.030)
pH: 6 (ref 5.0–8.0)

## 2013-05-18 LAB — CBC WITH DIFFERENTIAL/PLATELET
Eosinophils Absolute: 0 10*3/uL (ref 0.0–0.7)
HCT: 38.8 % (ref 36.0–46.0)
Hemoglobin: 11.9 g/dL — ABNORMAL LOW (ref 12.0–15.0)
Lymphs Abs: 0.8 10*3/uL (ref 0.7–4.0)
MCH: 26.6 pg (ref 26.0–34.0)
Monocytes Absolute: 0.3 10*3/uL (ref 0.1–1.0)
Monocytes Relative: 5 % (ref 3–12)
Neutrophils Relative %: 78 % — ABNORMAL HIGH (ref 43–77)
RBC: 4.47 MIL/uL (ref 3.87–5.11)

## 2013-05-18 LAB — GLUCOSE, CAPILLARY
Glucose-Capillary: 134 mg/dL — ABNORMAL HIGH (ref 70–99)
Glucose-Capillary: 107 mg/dL — ABNORMAL HIGH (ref 70–99)
Glucose-Capillary: 218 mg/dL — ABNORMAL HIGH (ref 70–99)

## 2013-05-18 LAB — COMPREHENSIVE METABOLIC PANEL
ALT: 8 U/L (ref 0–35)
AST: 14 U/L (ref 0–37)
Albumin: 3.4 g/dL — ABNORMAL LOW (ref 3.5–5.2)
Alkaline Phosphatase: 47 U/L (ref 39–117)
Calcium: 9.5 mg/dL (ref 8.4–10.5)
GFR calc Af Amer: 27 mL/min — ABNORMAL LOW (ref >90)
Potassium: 3.8 mEq/L (ref 3.5–5.1)
Sodium: 144 mEq/L (ref 135–145)
Total Protein: 6.5 g/dL (ref 6.0–8.3)

## 2013-05-18 LAB — URINE MICROSCOPIC-ADD ON

## 2013-05-18 LAB — HEMOGLOBIN A1C: Mean Plasma Glucose: 192 mg/dL — ABNORMAL HIGH (ref <117)

## 2013-05-18 MED ORDER — INSULIN ASPART 100 UNIT/ML ~~LOC~~ SOLN
0.0000 [IU] | Freq: Three times a day (TID) | SUBCUTANEOUS | Status: DC
  Administered 2013-05-18: 3 [IU] via SUBCUTANEOUS
  Administered 2013-05-18: 1 [IU] via SUBCUTANEOUS
  Administered 2013-05-19 (×2): 2 [IU] via SUBCUTANEOUS
  Administered 2013-05-20 – 2013-05-21 (×2): 5 [IU] via SUBCUTANEOUS
  Administered 2013-05-21: 3 [IU] via SUBCUTANEOUS
  Administered 2013-05-22: 1 [IU] via SUBCUTANEOUS
  Administered 2013-05-22: 3 [IU] via SUBCUTANEOUS
  Administered 2013-05-22: 1 [IU] via SUBCUTANEOUS
  Administered 2013-05-23: 2 [IU] via SUBCUTANEOUS
  Administered 2013-05-23: 5 [IU] via SUBCUTANEOUS
  Administered 2013-05-23: 2 [IU] via SUBCUTANEOUS

## 2013-05-18 MED ORDER — FAMOTIDINE 20 MG PO TABS
20.0000 mg | ORAL_TABLET | Freq: Every day | ORAL | Status: DC
  Administered 2013-05-18 – 2013-05-24 (×7): 20 mg via ORAL
  Filled 2013-05-18 (×7): qty 1

## 2013-05-18 MED ORDER — FENTANYL 50 MCG/HR TD PT72
50.0000 ug | MEDICATED_PATCH | TRANSDERMAL | Status: DC
  Administered 2013-05-18 – 2013-05-24 (×3): 50 ug via TRANSDERMAL
  Filled 2013-05-18 (×3): qty 1

## 2013-05-18 MED ORDER — GABAPENTIN 300 MG PO CAPS
300.0000 mg | ORAL_CAPSULE | Freq: Two times a day (BID) | ORAL | Status: DC
  Administered 2013-05-18 – 2013-05-24 (×13): 300 mg via ORAL
  Filled 2013-05-18 (×14): qty 1

## 2013-05-18 MED ORDER — SIROLIMUS 1 MG PO TABS
3.0000 mg | ORAL_TABLET | Freq: Every day | ORAL | Status: DC
  Administered 2013-05-18 – 2013-05-24 (×7): 3 mg via ORAL
  Filled 2013-05-18 (×7): qty 3

## 2013-05-18 MED ORDER — PREDNISONE 10 MG PO TABS
10.0000 mg | ORAL_TABLET | Freq: Every day | ORAL | Status: DC
  Administered 2013-05-19 – 2013-05-24 (×6): 10 mg via ORAL
  Filled 2013-05-18 (×8): qty 1

## 2013-05-18 MED ORDER — LEVOTHYROXINE SODIUM 112 MCG PO TABS
224.0000 ug | ORAL_TABLET | Freq: Every day | ORAL | Status: DC
  Administered 2013-05-19 – 2013-05-24 (×6): 224 ug via ORAL
  Filled 2013-05-18 (×7): qty 2

## 2013-05-18 MED ORDER — UREA 40 % EX CREA: 1.0000 "application " | TOPICAL_CREAM | Freq: Two times a day (BID) | CUTANEOUS | Status: DC

## 2013-05-18 MED ORDER — DEXTROSE 5 % IV SOLN
1.0000 g | Freq: Once | INTRAVENOUS | Status: AC
  Administered 2013-05-18: 1 g via INTRAVENOUS
  Filled 2013-05-18: qty 10

## 2013-05-18 MED ORDER — MYCOPHENOLATE MOFETIL 250 MG PO CAPS
500.0000 mg | ORAL_CAPSULE | Freq: Two times a day (BID) | ORAL | Status: DC
  Administered 2013-05-18 – 2013-05-24 (×13): 500 mg via ORAL
  Filled 2013-05-18 (×14): qty 2

## 2013-05-18 MED ORDER — SODIUM CHLORIDE 0.9 % IV SOLN
INTRAVENOUS | Status: DC
  Administered 2013-05-18 – 2013-05-19 (×2): via INTRAVENOUS
  Administered 2013-05-20 – 2013-05-21 (×2): 1000 mL via INTRAVENOUS
  Administered 2013-05-22 – 2013-05-23 (×2): via INTRAVENOUS

## 2013-05-18 MED ORDER — INSULIN ASPART 100 UNIT/ML ~~LOC~~ SOLN
0.0000 [IU] | Freq: Every day | SUBCUTANEOUS | Status: DC
  Administered 2013-05-18 – 2013-05-21 (×4): 2 [IU] via SUBCUTANEOUS
  Administered 2013-05-22: 3 [IU] via SUBCUTANEOUS
  Administered 2013-05-23: 2 [IU] via SUBCUTANEOUS

## 2013-05-18 MED ORDER — INSULIN DETEMIR 100 UNIT/ML ~~LOC~~ SOLN
10.0000 [IU] | Freq: Every day | SUBCUTANEOUS | Status: DC
  Administered 2013-05-18 – 2013-05-21 (×4): 10 [IU] via SUBCUTANEOUS
  Filled 2013-05-18 (×5): qty 0.1

## 2013-05-18 MED ORDER — LABETALOL HCL 300 MG PO TABS
300.0000 mg | ORAL_TABLET | Freq: Three times a day (TID) | ORAL | Status: DC
  Administered 2013-05-18 – 2013-05-24 (×18): 300 mg via ORAL
  Filled 2013-05-18 (×23): qty 1

## 2013-05-18 MED ORDER — CALCITRIOL 0.5 MCG PO CAPS
0.5000 ug | ORAL_CAPSULE | Freq: Every day | ORAL | Status: DC
  Administered 2013-05-19 – 2013-05-24 (×6): 0.5 ug via ORAL
  Filled 2013-05-18 (×8): qty 1

## 2013-05-18 MED ORDER — DEXTROSE 5 % IV SOLN
1.0000 g | INTRAVENOUS | Status: DC
  Administered 2013-05-19 – 2013-05-22 (×4): 1 g via INTRAVENOUS
  Filled 2013-05-18 (×6): qty 10

## 2013-05-18 MED ORDER — AMLODIPINE BESYLATE 10 MG PO TABS
10.0000 mg | ORAL_TABLET | Freq: Every day | ORAL | Status: DC
  Administered 2013-05-19 – 2013-05-24 (×6): 10 mg via ORAL
  Filled 2013-05-18 (×7): qty 1

## 2013-05-18 MED ORDER — UREA 40 % EX CREA
TOPICAL_CREAM | Freq: Two times a day (BID) | CUTANEOUS | Status: DC
  Administered 2013-05-19: 22:00:00 via TOPICAL

## 2013-05-18 MED ORDER — LATANOPROST 0.005 % OP SOLN
1.0000 [drp] | Freq: Every day | OPHTHALMIC | Status: DC
  Administered 2013-05-18 – 2013-05-23 (×6): 1 [drp] via OPHTHALMIC
  Filled 2013-05-18: qty 2.5

## 2013-05-18 NOTE — ED Provider Notes (Signed)
CSN: [id number]     Arrival date & time 05/18/13  4098 History     First MD Initiated Contact with Patient 05/18/13 [phone]     Chief Complaint  Patient presents with  . Altered Mental Status    HPI Per EMS: pt coming from home. Pt has not been feeling well for three weeks. Pt's husband states pt isn't answering questions appropriately. Pt has hx of the same with UTI. Pt is alert but disoriented x3, skin hot to the touch, respirations equal and unlabored, equal grips, no facial droop noted, pt is blind in left eye  Past Medical History  Diagnosis Date  . Diabetes mellitus   . Diabetic neuropathy   . Hypothyroidism   . Hypertension   . GERD (gastroesophageal reflux disease)   . Heart murmur     not treated for  . Arthritis   . Chronic kidney disease     RENAL TRANSPLANT IN 2008--PT IS FOLLOWED BY DR. Karie Fetch WITH BUN OF 37 AND CREAT 1.7--PER NOTE DR. NESI FROM 04/11/12  . Urethral diverticulum   . Eyesight diminished     GLAUCOMA   . History of shingles     LEFT EYE JAN 2012--STILL HAS EYE PAIN-STATES NOT ABLE TO TAKE THE MEDICATION FOR SHINGLES BECAUSE OF HER HX OF KIDNEY TRANSPLANT  . Fingernail abnormalities     FUNGUS OF FINGERNAILS  . UTI (urinary tract infection) 05/18/2013   Past Surgical History  Procedure Laterality Date  . Kidney transplant  2008    At [facility]  . Back surgery      ruptured disk  . Abdominal hysterectomy    . Appendectomy    . Laser surgery of both eyes for hemorrhages    . 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 Urethral diverticulectomy  04/28/2012    Procedure: URETHRAL DIVERTICULECTOMY;  Surgeon: Antony Haste, MD;  Location: [facility];  Service: Urology;  Laterality: N/A;   Family History  Problem Relation Age of Onset  . Anesthesia problems Neg Hx    History  Substance Use Topics  . Smoking status: Former Smoker -- 0.50 packs/day for 20 years    Quit date: 10/21/1978  . Smokeless tobacco: Never Used  . Alcohol Use:  No   OB History   Grav Para Term Preterm Abortions TAB SAB Ect Mult Living                 Review of Systems All other systems reviewed and are negative Allergies  Adhesive; Aspirin; Hydrocodone; Iohexol; Oxycodone; Penicillins; Tacrolimus; and Contrast media  Home Medications   No current outpatient prescriptions on file. BP 155/70  Pulse 79  Temp(Src) 99.1 F (37.3 C) (Oral)  Resp 18  Ht 5\' 6"  (1.676 m)  Wt 140 lb (63.504 kg)  BMI 22.61 kg/m2  SpO2 97% Physical Exam  Nursing note and vitals reviewed. Constitutional: She is oriented to person, place, and time. She appears well-developed and well-nourished. She appears lethargic. No distress.  HENT:  Head: Normocephalic and atraumatic.  Mouth/Throat: Mucous membranes are dry.  Eyes: Conjunctivae are normal.  Neck: Normal range of motion.  Cardiovascular: Normal rate and intact distal pulses.   Pulmonary/Chest: No respiratory distress.  Abdominal: Normal appearance. She exhibits no distension.  Musculoskeletal: Normal range of motion.  Neurological: She is oriented to person, place, and time. She appears lethargic. No cranial nerve deficit. GCS eye subscore is 3. GCS verbal subscore is 5. GCS motor subscore is 6.  Skin: Skin is warm and dry. No rash noted.  Psychiatric: She has a normal mood and affect. Her behavior is normal.    ED Course   Procedures (including critical care time)  Labs Reviewed  COMPREHENSIVE METABOLIC PANEL - Abnormal; Notable for the following:    CO2 33 (*)    Glucose, Bld 149 (*)    BUN 32 (*)    Creatinine, Ser 1.99 (*)    Albumin 3.4 (*)    GFR calc non Af Amer 24 (*)    GFR calc Af Amer 27 (*)    All other components within normal limits  URINALYSIS, ROUTINE W REFLEX MICROSCOPIC - Abnormal; Notable for the following:    Glucose, UA 250 (*)    Leukocytes, UA MODERATE (*)    All other components within normal limits  CBC WITH DIFFERENTIAL - Abnormal; Notable for the following:     Hemoglobin 11.9 (*)    RDW 17.4 (*)    Neutrophils Relative % 78 (*)    All other components within normal limits  GLUCOSE, CAPILLARY - Abnormal; Notable for the following:    Glucose-Capillary 134 (*)    All other components within normal limits  GLUCOSE, CAPILLARY - Abnormal; Notable for the following:    Glucose-Capillary 107 (*)    All other components within normal limits  HEMOGLOBIN A1C - Abnormal; Notable for the following:    Hemoglobin A1C 8.3 (*)    Mean Plasma Glucose 192 (*)    All other components within normal limits  GLUCOSE, CAPILLARY - Abnormal; Notable for the following:    Glucose-Capillary 218 (*)    All other components within normal limits  CBC - Abnormal; Notable for the following:    Hemoglobin 11.1 (*)    HCT 35.2 (*)    RDW 17.4 (*)    All other components within normal limits  GLUCOSE, CAPILLARY - Abnormal; Notable for the following:    Glucose-Capillary 146 (*)    All other components within normal limits  GLUCOSE, CAPILLARY - Abnormal; Notable for the following:    Glucose-Capillary 218 (*)    All other components within normal limits  URINE CULTURE  URINE MICROSCOPIC-ADD ON  COMPREHENSIVE METABOLIC PANEL   Dg Chest 2 View  05/18/2013   *RADIOLOGY REPORT*  Clinical Data: Altered mental status.  Back pain.  CHEST - 2 VIEW  Comparison: 01/03/2013.  Findings: Cardiopericardial silhouette remains mildly enlarged, no interval change.  There is no airspace disease or pleural effusion. Diffuse prominence of the interstitium is chronic.  Aortic arch atherosclerosis.  IMPRESSION: Cardiomegaly without failure.  No change compared to prior.   Original Report Authenticated By: Andreas Newport, M.D.   1. Fever   2. Lethargy     MDM  Discuss case with her private doctor who came to emergency room in patient was admitted per his orders  Nelia Shi, MD 05/19/13 [phone]

## 2013-05-18 NOTE — H&P (Signed)
PCP:   Julian Hy, MD   Chief Complaint:  Confusion and fever  HPI:  72 YO BF with hx renal transplant, recurrent UTI, DM 2. Has been doing less well for several days. Now exhibiting her typical pattern of slowed mentation and fever that characterizes her UTI's. Not eating well. Unable to stand. Slow to answer. No chills or sweats. BS's OK, no recent lows. Bowels OK. Chronic back pain at baseline. No CP or SOB.   Review of Systems: wt stable, eye irritated.  Review of Systems -  Past Medical History: Past Medical History  Diagnosis Date  . Diabetes mellitus type 2, 250.42   . Diabetic neuropathy   . Hypothyroidism   . Hypertension   . GERD (gastroesophageal reflux disease)   . Heart murmur     not treated for  . Arthritis   . Chronic kidney disease     RENAL TRANSPLANT IN 2008--PT IS FOLLOWED BY DR. Karie Fetch WITH BUN OF 37 AND CREAT 1.7--PER NOTE DR. NESI FROM 04/11/12  . Urethral diverticulum   . Eyesight diminished     GLAUCOMA   . History of shingles     LEFT EYE JAN 2012--STILL HAS EYE PAIN-STATES NOT ABLE TO TAKE THE MEDICATION FOR SHINGLES BECAUSE OF HER HX OF KIDNEY TRANSPLANT  . Fingernail abnormalities     FUNGUS OF FINGERNAILS   Past Surgical History  Procedure Laterality Date  . Kidney transplant  2008    At [facility]  . Back surgery      ruptured disk  . Abdominal hysterectomy    . Appendectomy    . Laser surgery of both eyes for hemorrhages    . 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 Urethral diverticulectomy  04/28/2012    Procedure: URETHRAL DIVERTICULECTOMY;  Surgeon: Antony Haste, MD;  Location: [facility];  Service: Urology;  Laterality: N/A;    Medications: Prior to Admission medications   Medication Sig Start Date End Date Taking? Authorizing Provider  amLODipine (NORVASC) 10 MG tablet Take 1 tablet (10 mg total) by mouth daily with breakfast. 02/10/13  Yes Claudie Revering, NP  calcitRIOL (ROCALTROL) 0.5 MCG capsule Take 1  capsule (0.5 mcg total) by mouth daily with breakfast. 02/10/13  Yes Claudie Revering, NP  fentaNYL (DURAGESIC - DOSED MCG/HR) 12 MCG/HR Apply one patch every 6 days Rotate site 01/25/13  Yes Tiffany L Reed, DO  fentaNYL (DURAGESIC - DOSED MCG/HR) 50 MCG/HR Place 1 patch onto the skin every 3 (three) days. New patch was applied on Thursday   Yes Historical Provider, MD  furosemide (LASIX) 40 MG tablet Take 1 tablet (40 mg total) by mouth 2 (two) times daily. 02/10/13  Yes Claudie Revering, NP  gabapentin (NEURONTIN) 300 MG capsule Take 300 mg by mouth 2 (two) times daily.   Yes Historical Provider, MD  insulin detemir (LEVEMIR) 100 UNIT/ML injection Inject 28 Units into the skin daily with breakfast.   Yes Historical Provider, MD  insulin lispro (HUMALOG) 100 UNIT/ML injection Inject 2-4 Units into the skin 3 (three) times daily before meals.   Yes Historical Provider, MD  ketoconazole (NIZORAL) 2 % cream Apply 1 application topically 2 (two) times daily.   Yes Historical Provider, MD  labetalol (NORMODYNE) 200 MG tablet Take 1.5 tablets (300 mg total) by mouth 3 (three) times daily. Takes 1 and 1/2 tabs 02/10/13  Yes Claudie Revering, NP  latanoprost (XALATAN) 0.005 % ophthalmic solution Place 1 drop into the  right eye at bedtime. 02/10/13  Yes Claudie Revering, NP  levothyroxine (SYNTHROID, LEVOTHROID) 112 MCG tablet Take 2 tablets (224 mcg total) by mouth daily with breakfast. 02/10/13  Yes Claudie Revering, NP  mycophenolate (CELLCEPT) 250 MG capsule Take 2 capsules (500 mg total) by mouth 2 (two) times daily. 02/10/13  Yes Claudie Revering, NP  potassium chloride SA (K-DUR,KLOR-CON) 20 MEQ tablet Take 1 tablet (20 mEq total) by mouth 2 (two) times daily. 02/10/13  Yes Claudie Revering, NP  predniSONE (DELTASONE) 5 MG tablet Take 2 tablets (10 mg total) by mouth daily with breakfast. 02/10/13  Yes Claudie Revering, NP  ranitidine (ZANTAC) 150 MG tablet Take 1 tablet (150 mg total) by mouth daily. 02/10/13  Yes  Claudie Revering, NP  sirolimus (RAPAMUNE) 1 MG tablet Take 3 tablets (3 mg total) by mouth daily. Pt takes three 1 mg tablets = 3 mg daily 02/10/13  Yes Claudie Revering, NP  urea (CARMOL) 40 % CREA Apply 1 application topically 2 (two) times daily. Apply to finger nails, and cuticles.   Yes Historical Provider, MD    Allergies:   Allergies  Allergen Reactions  . Adhesive (Tape)     Skin irritation, "pulls the skin off"   . Aspirin     Crawling feeling under skin  . Hydrocodone Other (See Comments)    Hallucinations   . Iohexol      Desc: pt unable to tell what the reaction was   . Oxycodone     hallucinations  . Penicillins Hives and Swelling    Has tolerated cefazolin and ceftriaxone.  . Tacrolimus Other (See Comments)    Hallucinations   . Contrast Media (Iodinated Diagnostic Agents) Hives, Swelling and Rash    Social History:  reports that she quit smoking about 34 years ago. She has never used smokeless tobacco. She reports that she does not drink alcohol or use illicit drugs. Married, RJ  Family History: Family History  Problem Relation Age of Onset  . Anesthesia problems Neg Hx     Physical Exam: Filed Vitals:   05/18/13 0431 05/18/13 0437 05/18/13 0545 05/18/13 0600  BP:  148/104 173/78 178/95  Pulse:  81 80 78  Temp:  100 F (37.8 C)    TempSrc:  Rectal    Resp:  20 17 29   Height:  5\' 6"  (1.676 m)    Weight:  63.504 kg (140 lb)    SpO2: 95% 98% 98% 97%   General appearance: chronically ill in no distress. Left eye is much less edematous and red. anicteric  oral membranes moist Neck: no adenopathy, no carotid bruit, no JVD and thyroid not enlarged, symmetric, no tenderness/mass/nodules Resp: clear with no wheezes, rales or rhonchi, no accessory ms in use Cardio: regular with sys murmur GI: soft, non-tender; bowel sounds normal; no masses,  no organomegaly, healed scars Extremities: extremities normal, atraumatic, no cyanosis or edema Pulses: 2+ and  symmetric, fungal nails Lymph nodes: Cervical adenopathy: no cervical lymphadenopathy Neurologic: awake, slow to answer. Speech low volume, no tremor.   Labs on Admission:   Recent Labs  05/18/13 0510  NA 144  K 3.8  CL 101  CO2 33*  GLUCOSE 149*  BUN 32*  CREATININE 1.99*  CALCIUM 9.5    Recent Labs  05/18/13 0510  AST 14  ALT 8  ALKPHOS 47  BILITOT 0.6  PROT 6.5  ALBUMIN 3.4*     Recent Labs  05/18/13 0510  WBC 4.8  NEUTROABS 3.8  HGB 11.9*  HCT 38.8  MCV 86.8  PLT 195        Radiological Exams on Admission: Dg Chest 2 View  05/18/2013   *RADIOLOGY REPORT*  Clinical Data: Altered mental status.  Back pain.  CHEST - 2 VIEW  Comparison: 01/03/2013.  Findings: Cardiopericardial silhouette remains mildly enlarged, no interval change.  There is no airspace disease or pleural effusion. Diffuse prominence of the interstitium is chronic.  Aortic arch atherosclerosis.  IMPRESSION: Cardiomegaly without failure.  No change compared to prior.   Original Report Authenticated By: Andreas Newport, M.D.   Orders placed during the hospital encounter of 06/27/12  . EKG 12-LEAD  . EKG 12-LEAD    Assessment/Plan Principal Problem:   UTI (lower urinary tract infection): another UTI with same presentation. Minor fever and no leukocytosis. No sepsis noted. Last 2 UTI's PCN sensitive. Has taken rocephin in past without trouble. Chronically on Cipro. Agree with this choice Active Problems:   Hypertension:BP up some, continue meds   Chronic kidney disease, stage III (moderate):sl worse than her baseline but better than last admission   Metabolic encephalopathy: moderate   Diabetes mellitus type 2, uncontrolled. BS's low, reduce Rx   Weakness: still an issue   History of renal transplant: sl worse   Taro Hidrogo [name] 05/18/2013, 9:30 AM

## 2013-05-18 NOTE — ED Notes (Signed)
Changed pt's gown.

## 2013-05-18 NOTE — Progress Notes (Signed)
NURSING PROGRESS NOTE  [name] [id number] Admission Data: 05/18/2013 3:54 PM Attending Provider: Julian Hy, MD QQV:ZDGLO,VFIEPPI [name], MD Code Status: full  [name] is a 72 y.o. female patient admitted from ED:  -No acute distress noted.  -No complaints of shortness of breath.  -No complaints of chest pain.   Cardiac Monitoring: Box # [id number] in place. Cardiac monitor yields:normal sinus rhythm.  Blood pressure 162/69, pulse 83, temperature 98.4 F (36.9 C), temperature source Oral, resp. rate 20, height 5\' 6"  (1.676 m), weight 63.504 kg (140 lb), SpO2 98.00%.   IV Fluids:  IV in place, occlusive dsg intact without redness, IV cath hand right, condition patent and no redness normal saline.   Allergies:  Adhesive; Aspirin; Hydrocodone; Iohexol; Oxycodone; Penicillins; Tacrolimus; and Contrast media  Past Medical History:   has a past medical history of Diabetes mellitus; Diabetic neuropathy; Hypothyroidism; Hypertension; GERD (gastroesophageal reflux disease); Heart murmur; Arthritis; Chronic kidney disease; Urethral diverticulum; Eyesight diminished; History of shingles; Fingernail abnormalities; and UTI (urinary tract infection) (05/18/2013).  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  Social History:   reports that she quit smoking about 34 years ago. She has never used smokeless tobacco. She reports that she does not drink alcohol or use illicit drugs.  Skin: warm dry intact  Patient/Family orientated to room. Information packet given to patient/family. Admission inpatient armband information verified with patient/family to include name and date of birth and placed on patient arm. Side rails up x 2, fall assessment and education completed with  patient/family. Patient/family able to verbalize understanding of risk associated with falls and verbalized understanding to call for assistance before getting out of bed. Call light within reach. Patient/family able to voice and demonstrate understanding of unit orientation instructions.    Will continue to evaluate and treat per MD orders.

## 2013-05-18 NOTE — ED Notes (Signed)
Pt placed on bed pan; family at bedside;

## 2013-05-18 NOTE — ED Notes (Signed)
Pt eating breakfast 

## 2013-05-18 NOTE — ED Notes (Signed)
Ordered pt a carb modified diet tray

## 2013-05-18 NOTE — ED Notes (Signed)
Per EMS: pt coming from home. Pt has not been feeling well for three weeks. Pt's husband states pt isn't answering questions appropriately. Pt has hx of the same with UTI. Pt is alert but disoriented x3, skin hot to the touch, respirations equal and unlabored, equal grips, no facial droop noted, pt is blind in left eye

## 2013-05-19 LAB — COMPREHENSIVE METABOLIC PANEL
ALT: 6 U/L (ref 0–35)
AST: 11 U/L (ref 0–37)
Albumin: 2.8 g/dL — ABNORMAL LOW (ref 3.5–5.2)
Alkaline Phosphatase: 40 U/L (ref 39–117)
BUN: 27 mg/dL — ABNORMAL HIGH (ref 6–23)
Chloride: 102 mEq/L (ref 96–112)
Potassium: 4 mEq/L (ref 3.5–5.1)
Sodium: 142 mEq/L (ref 135–145)
Total Bilirubin: 0.5 mg/dL (ref 0.3–1.2)

## 2013-05-19 LAB — GLUCOSE, CAPILLARY
Glucose-Capillary: 164 mg/dL — ABNORMAL HIGH (ref 70–99)
Glucose-Capillary: 233 mg/dL — ABNORMAL HIGH (ref 70–99)

## 2013-05-19 LAB — CBC
HCT: 35.2 % — ABNORMAL LOW (ref 36.0–46.0)
Platelets: 172 10*3/uL (ref 150–400)
RDW: 17.4 % — ABNORMAL HIGH (ref 11.5–15.5)
WBC: 5.7 10*3/uL (ref 4.0–10.5)

## 2013-05-19 LAB — URINE CULTURE: Colony Count: >=100000

## 2013-05-19 MED ORDER — ENOXAPARIN SODIUM 30 MG/0.3ML ~~LOC~~ SOLN
30.0000 mg | SUBCUTANEOUS | Status: DC
  Administered 2013-05-19 – 2013-05-23 (×5): 30 mg via SUBCUTANEOUS
  Filled 2013-05-19 (×7): qty 0.3

## 2013-05-19 MED ORDER — ENOXAPARIN SODIUM 40 MG/0.4ML ~~LOC~~ SOLN: 40.0000 mg | SUBCUTANEOUS | Status: DC

## 2013-05-19 NOTE — Progress Notes (Signed)
Subjective: Slow to mentate still. Eating OK. Not close to her baseline yet   Objective: Vital signs in last 24 hours: Temp:  [98.2 F (36.8 C)-100.7 F (38.2 C)] 99.1 F (37.3 C) (07/30 0521) Pulse Rate:  [74-83] 79 (07/30 0521) Resp:  [15-20] 18 (07/30 0521) BP: (136-193)/(64-74) 155/70 mmHg (07/30 0521) SpO2:  [96 %-98 %] 97 % (07/30 0521)  Intake/Output from previous day: 07/29 0701 - 07/30 0700 In: 222 [P.O.:222] Out: -  Intake/Output this shift:    Sitting up in no distress. More left orbital edema. Lungs clear. Ht regular with murmur. abd soft NT.  Confused. Slow mentation, sl tremor  Lab Results   Recent Labs  05/18/13 0510 05/19/13 0620  WBC 4.8 5.7  RBC 4.47 4.09  HGB 11.9* 11.1*  HCT 38.8 35.2*  MCV 86.8 86.1  MCH 26.6 27.1  RDW 17.4* 17.4*  PLT 195 172    Recent Labs  05/18/13 0510 05/19/13 0620  NA 144 142  K 3.8 4.0  CL 101 102  CO2 33* 32  GLUCOSE 149* 126*  BUN 32* 27*  CREATININE 1.99* 1.81*  CALCIUM 9.5 8.9    Studies/Results: Dg Chest 2 View  05/18/2013   *RADIOLOGY REPORT*  Clinical Data: Altered mental status.  Back pain.  CHEST - 2 VIEW  Comparison: 01/03/2013.  Findings: Cardiopericardial silhouette remains mildly enlarged, no interval change.  There is no airspace disease or pleural effusion. Diffuse prominence of the interstitium is chronic.  Aortic arch atherosclerosis.  IMPRESSION: Cardiomegaly without failure.  No change compared to prior.   Original Report Authenticated By: Andreas Newport, M.D.    Scheduled Meds: . amLODipine  10 mg Oral Q breakfast  . calcitRIOL  0.5 mcg Oral Q breakfast  . cefTRIAXone (ROCEPHIN)  IV  1 g Intravenous Q24H  . enoxaparin (LOVENOX) injection  40 mg Subcutaneous Q24H  . famotidine  20 mg Oral Daily  . fentaNYL  50 mcg Transdermal Q72H  . gabapentin  300 mg Oral BID  . insulin aspart  0-5 Units Subcutaneous QHS  . insulin aspart  0-9 Units Subcutaneous TID WC  . insulin detemir  10 Units  Subcutaneous QHS  . labetalol  300 mg Oral TID  . latanoprost  1 drop Right Eye QHS  . levothyroxine  224 mcg Oral QAC breakfast  . mycophenolate  500 mg Oral BID  . predniSONE  10 mg Oral Q breakfast  . sirolimus  3 mg Oral Daily  . urea   Topical BID   Continuous Infusions: . sodium chloride 75 mL/hr at 05/18/13 1044   PRN Meds:  Assessment/Plan:  UTI (lower urinary tract infection): on rocephin Tmax 110.7    Hypertension:BP improved Chronic kidney disease, stage III (moderate):sl better at 1.81 Metabolic encephalopathy: moderate , still quite prominent Diabetes mellitus type 2, uncontrolled. FBS good at 104 Weakness: still an issue  History of renal transplant: sl better    LOS: 1 day   [name] 05/19/2013, 8:42 AM

## 2013-05-20 ENCOUNTER — Ambulatory Visit: Payer: Medicare Other | Admitting: Neurology

## 2013-05-20 LAB — GLUCOSE, CAPILLARY
Glucose-Capillary: 100 mg/dL — ABNORMAL HIGH (ref 70–99)
Glucose-Capillary: 103 mg/dL — ABNORMAL HIGH (ref 70–99)

## 2013-05-20 MED ORDER — KETOCONAZOLE 2 % EX CREA
TOPICAL_CREAM | Freq: Two times a day (BID) | CUTANEOUS | Status: DC
  Administered 2013-05-20 – 2013-05-23 (×6): via TOPICAL

## 2013-05-20 NOTE — Progress Notes (Signed)
Subjective: Feeling better. No chills or sweats.Tmax 99. Sitting up eating. More coherent   Objective: Vital signs in last 24 hours: Temp:  [98.3 F (36.8 C)-99.9 F (37.7 C)] 98.3 F (36.8 C) (07/31 0556) Pulse Rate:  [75-87] 87 (07/31 0556) Resp:  [18-20] 18 (07/31 0556) BP: (107-153)/(57-72) 153/72 mmHg (07/31 0556) SpO2:  [93 %-96 %] 93 % (07/31 0556)  Intake/Output from previous day: 07/30 0701 - 07/31 0700 In: 3415 [P.O.:120; I.V.:3245; IV Piggyback:50] Out: -  Intake/Output this shift: Total I/O In: -  Out: 300 [Urine:300]  Left periorbital swelling, neck supple, lungs clear, ht regular with sys murmur. Awake, mentating better. Speech is better  Lab Results   Recent Labs  05/18/13 0510 05/19/13 0620  WBC 4.8 5.7  RBC 4.47 4.09  HGB 11.9* 11.1*  HCT 38.8 35.2*  MCV 86.8 86.1  MCH 26.6 27.1  RDW 17.4* 17.4*  PLT 195 172    Recent Labs  05/18/13 0510 05/19/13 0620  NA 144 142  K 3.8 4.0  CL 101 102  CO2 33* 32  GLUCOSE 149* 126*  BUN 32* 27*  CREATININE 1.99* 1.81*  CALCIUM 9.5 8.9    Studies/Results: No results found.  Scheduled Meds: . amLODipine  10 mg Oral Q breakfast  . calcitRIOL  0.5 mcg Oral Q breakfast  . cefTRIAXone (ROCEPHIN)  IV  1 g Intravenous Q24H  . enoxaparin (LOVENOX) injection  30 mg Subcutaneous Q24H  . famotidine  20 mg Oral Daily  . fentaNYL  50 mcg Transdermal Q72H  . gabapentin  300 mg Oral BID  . insulin aspart  0-5 Units Subcutaneous QHS  . insulin aspart  0-9 Units Subcutaneous TID WC  . insulin detemir  10 Units Subcutaneous QHS  . labetalol  300 mg Oral TID  . latanoprost  1 drop Right Eye QHS  . levothyroxine  224 mcg Oral QAC breakfast  . mycophenolate  500 mg Oral BID  . predniSONE  10 mg Oral Q breakfast  . sirolimus  3 mg Oral Daily  . urea   Topical BID   Continuous Infusions: . sodium chloride 75 mL/hr at 05/19/13 1009   PRN Meds:  Assessment/Plan:  UTI (lower urinary tract infection): on  rocephin Tmax 99, apparently had cx done at Dr Janit Bern Monday, will get results Hypertension:BP improved  Chronic kidney disease, stage III (moderate):sl better at 1.81  Metabolic encephalopathy: moderate , quite a bit better today  Diabetes mellitus type 2, uncontrolled. FBS good    Weakness: still an issue  History of renal transplant: sl better    LOS: 2 days   [name] [name] 05/20/2013, 9:20 AM

## 2013-05-21 ENCOUNTER — Inpatient Hospital Stay (HOSPITAL_COMMUNITY): Payer: Medicare Other

## 2013-05-21 LAB — GLUCOSE, CAPILLARY
Glucose-Capillary: 134 mg/dL — ABNORMAL HIGH (ref 70–99)
Glucose-Capillary: 251 mg/dL — ABNORMAL HIGH (ref 70–99)
Glucose-Capillary: 225 mg/dL — ABNORMAL HIGH (ref 70–99)

## 2013-05-21 NOTE — Progress Notes (Signed)
Subjective: Less interactive and less coherent. Won't interact much with me Check CT head and repeat labs BS's OK No pain   Objective: Vital signs in last 24 hours: Temp:  [98.1 F (36.7 C)-98.4 F (36.9 C)] 98.4 F (36.9 C) (08/01 0540) Pulse Rate:  [74-86] 78 (08/01 0540) Resp:  [16-18] 16 (08/01 0540) BP: (144-190)/(66-89) 159/89 mmHg (08/01 0540) SpO2:  [95 %-98 %] 95 % (08/01 0540)  Intake/Output from previous day: 07/31 0701 - 08/01 0700 In: 240 [P.O.:240] Out: 300 [Urine:300] Intake/Output this shift:    bilat periorbital edema. Oral membranes moist. Lungs clear, no wheeze, ht regular with murmur abd soft NT, extrems tr edema. Somnolent, slow mentation  Lab Results   Recent Labs  05/19/13 0620  WBC 5.7  RBC 4.09  HGB 11.1*  HCT 35.2*  MCV 86.1  MCH 27.1  RDW 17.4*  PLT 172    Recent Labs  05/19/13 0620  NA 142  K 4.0  CL 102  CO2 32  GLUCOSE 126*  BUN 27*  CREATININE 1.81*  CALCIUM 8.9    Studies/Results: No results found.  Scheduled Meds: . amLODipine  10 mg Oral Q breakfast  . calcitRIOL  0.5 mcg Oral Q breakfast  . cefTRIAXone (ROCEPHIN)  IV  1 g Intravenous Q24H  . enoxaparin (LOVENOX) injection  30 mg Subcutaneous Q24H  . famotidine  20 mg Oral Daily  . fentaNYL  50 mcg Transdermal Q72H  . gabapentin  300 mg Oral BID  . insulin aspart  0-5 Units Subcutaneous QHS  . insulin aspart  0-9 Units Subcutaneous TID WC  . insulin detemir  10 Units Subcutaneous QHS  . ketoconazole   Topical BID  . labetalol  300 mg Oral TID  . latanoprost  1 drop Right Eye QHS  . levothyroxine  224 mcg Oral QAC breakfast  . mycophenolate  500 mg Oral BID  . predniSONE  10 mg Oral Q breakfast  . sirolimus  3 mg Oral Daily   Continuous Infusions: . sodium chloride 1,000 mL (05/20/13 1601)   PRN Meds:  Assessment/Plan:  UTI (lower urinary tract infection): on rocephin no fever Hypertension:BP improved  Chronic kidney disease, stage III (moderate):sl  better at 1.81  Metabolic encephalopathy: moderate , worse again, check CT head Diabetes mellitus type 2, uncontrolled. FBS good at 134 Weakness: still an issue  History of renal transplant: sl better    LOS: 3 days   [name] 05/21/2013, 10:36 AM

## 2013-05-21 NOTE — Care Management Note (Signed)
    Page 1 of 1   05/24/2013     12:14:51 PM   CARE MANAGEMENT NOTE 05/24/2013  Patient:  [name], [name]   Account Number:  [id number]  Date Initiated:  05/21/2013  Documentation initiated by:  Letha Cape  Subjective/Objective Assessment:   dx uti  admit- lives with spouse.     Action/Plan:   Anticipated DC Date:  05/24/2013   Anticipated DC Plan:  HOME W HOME HEALTH SERVICES      DC Planning Services  CM consult      Choice offered to / List presented to:  C-3 Spouse        HH arranged  HH-2 PT  HH-4 NURSE'S AIDE      HH agency  [facility]   Status of service:  Completed, signed off Medicare Important Message given?   (If response is "NO", the following Medicare IM given date fields will be blank) Date Medicare IM given:   Date Additional Medicare IM given:    Discharge Disposition:  HOME W HOME HEALTH SERVICES  Per UR Regulation:  Reviewed for med. necessity/level of care/duration of stay  If discussed at Long Length of Stay Meetings, dates discussed:    Comments:  05/24/13 12:12 Letha Cape RN, BSN [phone] patient for dc to home today, per physical therapy recs hhpt, husband will be there with patient, he states he would like to work with [facility] for HHPT and aide, I asked if he would like a [facility] for medication management, he  stated no, he helps her with her medications.  Referral sent to [facility].  Soc will begin 24-48 hrs post discharge. Patient for dc today.  05/21/13 16:29 Letha Cape RN, BSN [phone] patient lives with spouse, NCM will continue to follor for dc needs.

## 2013-05-22 LAB — COMPREHENSIVE METABOLIC PANEL
AST: 9 U/L (ref 0–37)
Albumin: 2.7 g/dL — ABNORMAL LOW (ref 3.5–5.2)
BUN: 24 mg/dL — ABNORMAL HIGH (ref 6–23)
CO2: 27 mEq/L (ref 19–32)
Calcium: 8.7 mg/dL (ref 8.4–10.5)
Creatinine, Ser: 1.47 mg/dL — ABNORMAL HIGH (ref 0.50–1.10)
GFR calc non Af Amer: 34 mL/min — ABNORMAL LOW (ref >90)

## 2013-05-22 LAB — GLUCOSE, CAPILLARY: Glucose-Capillary: 131 mg/dL — ABNORMAL HIGH (ref 70–99)

## 2013-05-22 LAB — CBC
Hemoglobin: 11 g/dL — ABNORMAL LOW (ref 12.0–15.0)
MCH: 27.1 pg (ref 26.0–34.0)
Platelets: 194 10*3/uL (ref 150–400)
RBC: 4.06 MIL/uL (ref 3.87–5.11)
WBC: 3.4 10*3/uL — ABNORMAL LOW (ref 4.0–10.5)

## 2013-05-22 MED ORDER — INSULIN DETEMIR 100 UNIT/ML ~~LOC~~ SOLN
11.0000 [IU] | Freq: Every day | SUBCUTANEOUS | Status: DC
  Administered 2013-05-22: 11 [IU] via SUBCUTANEOUS
  Filled 2013-05-22 (×2): qty 0.11

## 2013-05-22 NOTE — Progress Notes (Signed)
PT Cancellation Note  Patient Details Name: PEYTAN ANDRINGA MRN: [id number] DOB: 1941-09-27   Cancelled Treatment:    Reason Eval/Treat Not Completed: Fatigue/lethargy limiting ability to participate   Olivia Canter 05/22/2013, 3:31 PM

## 2013-05-22 NOTE — Progress Notes (Signed)
Subjective: Less interactive and less coherent yesterday as she is in with UTI/ENcephalopathy Better today. She is on Pot having BM CCT was (-) No pain  Husband says she is doing better  Objective: Vital signs in last 24 hours: Temp:  [98.3 F (36.8 C)-98.6 F (37 C)] 98.3 F (36.8 C) (08/02 0602) Pulse Rate:  [72-76] 72 (08/02 0602) Resp:  [16-18] 18 (08/02 0602) BP: (122-170)/(55-99) 170/55 mmHg (08/02 0602) SpO2:  [94 %-97 %] 97 % (08/02 0602)  Intake/Output from previous day: 08/01 0701 - 08/02 0700 In: 4048.8 [P.O.:380; I.V.:3568.8; IV Piggyback:100] Out: -  Intake/Output this shift: Total I/O In: 55 [I.V.:55] Out: -   mentally alert and stronger. Oral membranes moist. Lungs clear, no wheeze, ht regular with murmur abd soft NT, extrems tr edema.  Lab Results   Recent Labs  05/22/13 0930  WBC 3.4*  RBC 4.06  HGB 11.0*  HCT 34.7*  MCV 85.5  MCH 27.1  RDW 16.6*  PLT 194   No results found for this basename: NA, K, CL, CO2, GLUCOSE, BUN, CREATININE, CALCIUM,  in the last 72 hours  Studies/Results: Ct Head Wo Contrast  05/21/2013   *RADIOLOGY REPORT*  Clinical Data: Confusion  CT HEAD WITHOUT CONTRAST  Technique:  Contiguous axial images were obtained from the base of the skull through the vertex without contrast.  Comparison: 06/27/2012  Findings: The brain shows generalized age related atrophy.  There are mild chronic appearing small vessel changes of the deep white matter.  No cortical or large vessel territory infarction.  No mass lesion, hemorrhage, hydrocephalus or extra-axial collection.  The calvarium is unremarkable.  Sinuses are clear.  There is atherosclerotic calcification of the major vessels at the base of the brain.  IMPRESSION: No acute finding.  Age related atrophy and mild chronic small vessel disease.   Original Report Authenticated By: Paulina Fusi, M.D.    Scheduled Meds: . amLODipine  10 mg Oral Q breakfast  . calcitRIOL  0.5 mcg Oral Q  breakfast  . cefTRIAXone (ROCEPHIN)  IV  1 g Intravenous Q24H  . enoxaparin (LOVENOX) injection  30 mg Subcutaneous Q24H  . famotidine  20 mg Oral Daily  . fentaNYL  50 mcg Transdermal Q72H  . gabapentin  300 mg Oral BID  . insulin aspart  0-5 Units Subcutaneous QHS  . insulin aspart  0-9 Units Subcutaneous TID WC  . insulin detemir  10 Units Subcutaneous QHS  . ketoconazole   Topical BID  . labetalol  300 mg Oral TID  . latanoprost  1 drop Right Eye QHS  . levothyroxine  224 mcg Oral QAC breakfast  . mycophenolate  500 mg Oral BID  . predniSONE  10 mg Oral Q breakfast  . sirolimus  3 mg Oral Daily   Continuous Infusions: . sodium chloride 75 mL/hr at 05/22/13 0642   PRN Meds:  Assessment/Plan: UTI (lower urinary tract infection): on rocephin no fever. I do not see a urine Cx. Wean to oral Abx tomorrow and anticipate D/c on Monday - Will get PT/OT/SW to work with her and see if she needs Rehab Hypertension:BP improved  Chronic kidney disease, stage III (moderate):sl better at 1.81 on BMET 7/30 Metabolic encephalopathy: Finally improving Diabetes mellitus type 2, uncontrolled. CBG 131-251. Weakness/Deconditioning - PT/OT/SW/Ambulate.  History of renal transplant: On immunosuppressants.  Recheck Cr tomorrow.    LOS: 4 days   Marium Ragan M 05/22/2013, 10:35 AM

## 2013-05-23 LAB — BASIC METABOLIC PANEL
BUN: 24 mg/dL — ABNORMAL HIGH (ref 6–23)
CO2: 25 mEq/L (ref 19–32)
GFR calc non Af Amer: 34 mL/min — ABNORMAL LOW (ref >90)
Glucose, Bld: 208 mg/dL — ABNORMAL HIGH (ref 70–99)
Potassium: 3.7 mEq/L (ref 3.5–5.1)

## 2013-05-23 LAB — GLUCOSE, CAPILLARY
Glucose-Capillary: 182 mg/dL — ABNORMAL HIGH (ref 70–99)
Glucose-Capillary: 264 mg/dL — ABNORMAL HIGH (ref 70–99)
Glucose-Capillary: 206 mg/dL — ABNORMAL HIGH (ref 70–99)

## 2013-05-23 MED ORDER — INSULIN DETEMIR 100 UNIT/ML ~~LOC~~ SOLN
13.0000 [IU] | Freq: Every day | SUBCUTANEOUS | Status: DC
  Administered 2013-05-23: 13 [IU] via SUBCUTANEOUS
  Filled 2013-05-23 (×2): qty 0.13

## 2013-05-23 MED ORDER — CEFUROXIME AXETIL 250 MG PO TABS
250.0000 mg | ORAL_TABLET | Freq: Two times a day (BID) | ORAL | Status: DC
  Administered 2013-05-23 – 2013-05-24 (×2): 250 mg via ORAL
  Filled 2013-05-23 (×4): qty 1

## 2013-05-23 MED ORDER — SODIUM CHLORIDE 0.9 % IV SOLN
INTRAVENOUS | Status: AC
  Administered 2013-05-23: 20:00:00 via INTRAVENOUS

## 2013-05-23 NOTE — Progress Notes (Signed)
Subjective: More interactive but had to be woken up. Await PT eval CCT was (-) No pain  Objective: Vital signs in last 24 hours: Temp:  [98.2 F (36.8 C)-99.6 F (37.6 C)] 99.6 F (37.6 C) (08/03 0434) Pulse Rate:  [73-76] 76 (08/03 0434) Resp:  [16-18] 18 (08/03 0434) BP: (120-176)/(56-74) 176/74 mmHg (08/03 0434) SpO2:  [97 %-99 %] 99 % (08/03 0434)  Intake/Output from previous day: 08/02 0701 - 08/03 0700 In: 2168.8 [P.O.:420; I.V.:1698.8; IV Piggyback:50] Out: -  Intake/Output this shift: Total I/O In: 31.3 [I.V.:31.3] Out: -   After I woke her up she mentally alert and stronger. Oral membranes moist. Lungs clear, no wheeze, ht regular with murmur abd soft NT, extrems tr edema.   Lab Results   Recent Labs  05/22/13 0930  WBC 3.4*  RBC 4.06  HGB 11.0*  HCT 34.7*  MCV 85.5  MCH 27.1  RDW 16.6*  PLT 194    Recent Labs  05/22/13 0930 05/23/13 0551  NA 142 141  K 3.5 3.7  CL 105 108  CO2 27 25  GLUCOSE 137* 208*  BUN 24* 24*  CREATININE 1.47* 1.46*  CALCIUM 8.7 8.8    Studies/Results: Ct Head Wo Contrast  05/21/2013   *RADIOLOGY REPORT*  Clinical Data: Confusion  CT HEAD WITHOUT CONTRAST  Technique:  Contiguous axial images were obtained from the base of the skull through the vertex without contrast.  Comparison: 06/27/2012  Findings: The brain shows generalized age related atrophy.  There are mild chronic appearing small vessel changes of the deep white matter.  No cortical or large vessel territory infarction.  No mass lesion, hemorrhage, hydrocephalus or extra-axial collection.  The calvarium is unremarkable.  Sinuses are clear.  There is atherosclerotic calcification of the major vessels at the base of the brain.  IMPRESSION: No acute finding.  Age related atrophy and mild chronic small vessel disease.   Original Report Authenticated By: Paulina Fusi, M.D.    Scheduled Meds: . amLODipine  10 mg Oral Q breakfast  . calcitRIOL  0.5 mcg Oral Q breakfast   . cefTRIAXone (ROCEPHIN)  IV  1 g Intravenous Q24H  . enoxaparin (LOVENOX) injection  30 mg Subcutaneous Q24H  . famotidine  20 mg Oral Daily  . fentaNYL  50 mcg Transdermal Q72H  . gabapentin  300 mg Oral BID  . insulin aspart  0-5 Units Subcutaneous QHS  . insulin aspart  0-9 Units Subcutaneous TID WC  . insulin detemir  11 Units Subcutaneous QHS  . ketoconazole   Topical BID  . labetalol  300 mg Oral TID  . latanoprost  1 drop Right Eye QHS  . levothyroxine  224 mcg Oral QAC breakfast  . mycophenolate  500 mg Oral BID  . predniSONE  10 mg Oral Q breakfast  . sirolimus  3 mg Oral Daily   Continuous Infusions: . sodium chloride 75 mL/hr at 05/23/13 0001   PRN Meds:  Assessment/Plan: UTI (lower urinary tract infection): on rocephin no fever. I do not see a urine Cx. Wean to oral Abx today and anticipate D/c on Monday/Tuesday - Will get PT/OT/SW to work with her and see if she needs Rehab  Hypertension:BP improved  Chronic kidney disease, stage III (moderate): Stable labs Metabolic encephalopathy: Finally improving Diabetes mellitus type 2, uncontrolled. CBG [id number]. Adjust Weakness/Deconditioning - PT/OT/SW/Ambulate.  History of renal transplant: On immunosuppressants.    i started to wean IVFs as well.  Hep lock tom am.  LOS: 5 days   Dorell Gatlin M 05/23/2013, 9:19 AM

## 2013-05-23 NOTE — Evaluation (Signed)
Physical Therapy Evaluation Patient Details Name: [name] MRN: [id number] DOB: [birth year] Today's Date: 05/23/2013 Time: 4098-1191 PT Time Calculation (min): 22 min  PT Assessment / Plan / Recommendation History of Present Illness    72 YO BF with hx renal transplant, recurrent UTI, DM 2. Has been doing less well for several days. Now exhibiting her typical pattern of slowed mentation and fever that characterizes her UTI's. Not eating well. Unable to stand. Slow to answer.    Clinical Impression  Patient demonstrates deficits in functional mobility as indicated below. Feel patient will benefit from continued skilled PT to address deficits and maximize function. Rec HHPT upon discharge. Will continue to see as indicated.    PT Assessment  Patient needs continued PT services    Follow Up Recommendations  Home health PT;Supervision/Assistance - 24 hour    Does the patient have the potential to tolerate intense rehabilitation      Barriers to Discharge        Equipment Recommendations  None recommended by PT    Recommendations for Other Services     Frequency Min 3X/week    Precautions / Restrictions Restrictions Weight Bearing Restrictions: No   Pertinent Vitals/Pain Patient reports some back pain (no value given)      Mobility  Bed Mobility Bed Mobility: Supine to Sit;Sitting - Scoot to Edge of Bed Supine to Sit: 4: Min assist Sitting - Scoot to [place] of Bed: 4: Min assist Details for Bed Mobility Assistance: assist for trunk support and elevation, assist for LE initiation Transfers Transfers: Sit to Stand;Stand to Sit Sit to Stand: 1: +2 Total assist;From bed Sit to Stand: Patient Percentage: 70% Stand to Sit: 3: Mod assist Details for Transfer Assistance: Initial assist for stability, once standing patient able to maintain without +2 assist Ambulation/Gait Ambulation/Gait Assistance: 4: Min assist Ambulation Distance (Feet): 22 Feet Assistive device: Rolling  walker Ambulation/Gait Assistance Details: VCs for technique and sequencing, assist for stability, fatigues easily Gait Pattern: Step-to pattern;Decreased stride length;Trunk flexed;Narrow base of support Gait velocity: decreased General Gait Details: unsteady with ambulation secondary to fatigue        PT Diagnosis: Difficulty walking;Generalized weakness  PT Problem List: Decreased strength;Decreased range of motion;Decreased activity tolerance;Decreased balance;Decreased mobility;Decreased safety awareness;Decreased skin integrity PT Treatment Interventions: DME instruction;Gait training;Stair training;Functional mobility training;Therapeutic activities;Therapeutic exercise;Balance training;Patient/family education     PT Goals(Current goals can be found in the care plan section) Acute Rehab PT Goals Patient Stated Goal: to go home PT Goal Formulation: With patient/family Time For Goal Achievement: 06/06/13 Potential to Achieve Goals: Good  Visit Information  Last PT Received On: 05/23/13 Assistance Needed: +2 Reason Eval/Treat Not Completed: Fatigue/lethargy limiting ability to participate       Prior Functioning  Home Living Family/patient expects to be discharged to:: Private residence Living Arrangements: Spouse/significant other Available Help at Discharge: Available 24 hours/day Type of Home: House Home Access: Stairs to enter [employer] of Steps: 2 Entrance Stairs-Rails: None Home Layout: One level Home Equipment: Walker - 4 wheels;Bedside commode;Grab bars - tub/shower;Wheelchair - power Additional Comments: used the walker in the house and the powered wheelchair out in the community. Prior Function Level of Independence: Needs assistance Comments: pt could walk to the bathroom with the walker without help when she was doing good. Communication Communication: HOH Dominant Hand: Right    Cognition  Cognition Arousal/Alertness: Lethargic Behavior  During Therapy: WFL for tasks assessed/performed Overall Cognitive Status: Impaired/Different from baseline Area of Impairment: Orientation;Awareness;Problem solving  Orientation Level: Disoriented to;Place;Time;Situation Awareness: Emergent Problem Solving: Slow processing;Decreased initiation;Difficulty sequencing;Requires verbal cues;Requires tactile cues General Comments: Husband states this is common symptoms when she has a UTI and states that it resolves quickly as the UTI clears    Extremity/Trunk Assessment Upper Extremity Assessment Upper Extremity Assessment: Defer to OT evaluation Lower Extremity Assessment Lower Extremity Assessment: Generalized weakness Cervical / Trunk Assessment Cervical / Trunk Assessment: Kyphotic   Balance Balance Balance Assessed: Yes Static Sitting Balance Static Sitting - Balance Support: Feet supported Static Sitting - Level of Assistance: 7: Independent Static Sitting - Comment/# of Minutes: 5 minutes  End of Session PT - End of Session Equipment Utilized During Treatment: Gait belt Activity Tolerance: Patient limited by fatigue Patient left: in chair;with call bell/phone within reach;with family/visitor present Nurse Communication: Mobility status  GP     Fabio Asa 05/23/2013, 11:16 AM Charlotte Crumb, PT DPT  [phone]

## 2013-05-24 LAB — BASIC METABOLIC PANEL
CO2: 25 mEq/L (ref 19–32)
Calcium: 8.7 mg/dL (ref 8.4–10.5)
Creatinine, Ser: 1.34 mg/dL — ABNORMAL HIGH (ref 0.50–1.10)
GFR calc Af Amer: 44 mL/min — ABNORMAL LOW (ref >90)
GFR calc non Af Amer: 38 mL/min — ABNORMAL LOW (ref >90)
Sodium: 143 mEq/L (ref 135–145)

## 2013-05-24 MED ORDER — CEFUROXIME AXETIL 250 MG PO TABS: 250.0000 mg | ORAL_TABLET | Freq: Two times a day (BID) | ORAL | Status: AC

## 2013-05-24 MED ORDER — POTASSIUM CHLORIDE CRYS ER 20 MEQ PO TBCR
20.0000 meq | EXTENDED_RELEASE_TABLET | Freq: Every day | ORAL | Status: DC
  Administered 2013-05-24: 20 meq via ORAL
  Filled 2013-05-24: qty 1

## 2013-05-24 MED ORDER — POTASSIUM CHLORIDE CRYS ER 20 MEQ PO TBCR: 20.0000 meq | EXTENDED_RELEASE_TABLET | Freq: Every day | ORAL | Status: AC

## 2013-05-24 MED ORDER — ENOXAPARIN SODIUM 40 MG/0.4ML ~~LOC~~ SOLN
40.0000 mg | Freq: Every day | SUBCUTANEOUS | Status: DC
  Administered 2013-05-24: 40 mg via SUBCUTANEOUS
  Filled 2013-05-24: qty 0.4

## 2013-05-24 NOTE — Progress Notes (Signed)
NURSING PROGRESS NOTE  [name] [id number] Discharge Data: 05/24/2013 2:15 PM Attending Provider: Julian Hy, MD WUJ:WJXBJ,YNWGNFA Erika Diener, MD     [name] to be D/C'd Home per MD order.  Discussed with the patient the After Visit Summary and all questions fully answered. All IV's discontinued with no bleeding noted. All belongings returned to patient for patient to take home.   Last Vital Signs:  Blood pressure 178/67, pulse 71, temperature 98.4 F (36.9 C), temperature source Oral, resp. rate 20, height 5\' 6"  (1.676 m), weight 63.504 kg (140 lb), SpO2 96.00%.  Discharge Medication List   Medication List         amLODipine 10 MG tablet  Commonly known as:  NORVASC  Take 1 tablet (10 mg total) by mouth daily with breakfast.     calcitRIOL 0.5 MCG capsule  Commonly known as:  ROCALTROL  Take 1 capsule (0.5 mcg total) by mouth daily with breakfast.     cefUROXime 250 MG tablet  Commonly known as:  CEFTIN  Take 1 tablet (250 mg total) by mouth 2 (two) times daily with a meal.     fentaNYL 12 MCG/HR  Commonly known as:  DURAGESIC - dosed mcg/hr  Apply one patch every 6 days Rotate site     fentaNYL 50 MCG/HR  Commonly known as:  DURAGESIC - dosed mcg/hr  Place 1 patch onto the skin every 3 (three) days. New patch was applied on Thursday     furosemide 40 MG tablet  Commonly known as:  LASIX  Take 1 tablet (40 mg total) by mouth 2 (two) times daily.     gabapentin 300 MG capsule  Commonly known as:  NEURONTIN  Take 300 mg by mouth 2 (two) times daily.     insulin detemir 100 UNIT/ML injection  Commonly known as:  LEVEMIR  Inject 28 Units into the skin daily with breakfast.     insulin lispro 100 UNIT/ML injection  Commonly known as:  HUMALOG  Inject 2-4 Units into the skin 3 (three) times daily before meals.     ketoconazole 2 % cream  Commonly known as:  NIZORAL  Apply 1 application topically 2 (two) times daily.     labetalol 200 MG tablet  Commonly  known as:  NORMODYNE  Take 1.5 tablets (300 mg total) by mouth 3 (three) times daily. Takes 1 and 1/2 tabs     latanoprost 0.005 % ophthalmic solution  Commonly known as:  XALATAN  Place 1 drop into the right eye at bedtime.     levothyroxine 112 MCG tablet  Commonly known as:  SYNTHROID, LEVOTHROID  Take 2 tablets (224 mcg total) by mouth daily with breakfast.     mycophenolate 250 MG capsule  Commonly known as:  CELLCEPT  Take 2 capsules (500 mg total) by mouth 2 (two) times daily.     potassium chloride SA 20 MEQ tablet  Commonly known as:  K-DUR,KLOR-CON  Take 1 tablet (20 mEq total) by mouth 2 (two) times daily.     potassium chloride SA 20 MEQ tablet  Commonly known as:  K-DUR,KLOR-CON  Take 1 tablet (20 mEq total) by mouth daily.     predniSONE 5 MG tablet  Commonly known as:  DELTASONE  Take 2 tablets (10 mg total) by mouth daily with breakfast.     ranitidine 150 MG tablet  Commonly known as:  ZANTAC  Take 1 tablet (150 mg total) by mouth daily.  sirolimus 1 MG tablet  Commonly known as:  RAPAMUNE  Take 3 tablets (3 mg total) by mouth daily. Pt takes three 1 mg tablets = 3 mg daily     urea 40 % Crea  Commonly known as:  CARMOL  Apply 1 application topically 2 (two) times daily. Apply to finger nails, and cuticles.

## 2013-05-24 NOTE — Discharge Summary (Signed)
DISCHARGE SUMMARY  [name]  MR#: [id number]  DOB:03/15/1941  Date of Admission: 05/18/2013 Date of Discharge: 05/24/2013  Attending Physician:Givanni Staron [name]  Patient's WUJ:WJXBJ,YNWGNFA Erika Diener, MD  Consults:   jnone  Discharge Diagnoses: Principal Problem:   UTI (lower urinary tract infection) Active Problems:   Hypertension. Under reasonable control   Chronic kidney disease, stage III (moderate), stable, back to baseline   Metabolic encephalopathy, fair, still with some deficits, CT of the head is negative   Diabetes mellitus type 2, improved status   Weakness, persistent   History of renal transplant Hypokalemia, replaced Anemia, mild Secondary hyperparathyroidism Chronic back pain Diabetic neuropathy Hypothyroidism Abrasion on right foot  Mild protein calorie malnutrition   Discharge Medications:   Medication List         amLODipine 10 MG tablet  Commonly known as:  NORVASC  Take 1 tablet (10 mg total) by mouth daily with breakfast.     calcitRIOL 0.5 MCG capsule  Commonly known as:  ROCALTROL  Take 1 capsule (0.5 mcg total) by mouth daily with breakfast.     cefUROXime 250 MG tablet  Commonly known as:  CEFTIN  Take 1 tablet (250 mg total) by mouth 2 (two) times daily with a meal.     fentaNYL 12 MCG/HR  Commonly known as:  DURAGESIC - dosed mcg/hr  Apply one patch every 6 days Rotate site     fentaNYL 50 MCG/HR  Commonly known as:  DURAGESIC - dosed mcg/hr  Place 1 patch onto the skin every 3 (three) days. New patch was applied on Thursday     furosemide 40 MG tablet  Commonly known as:  LASIX  Take 1 tablet (40 mg total) by mouth 2 (two) times daily.     gabapentin 300 MG capsule  Commonly known as:  NEURONTIN  Take 300 mg by mouth 2 (two) times daily.     insulin detemir 100 UNIT/ML injection  Commonly known as:  LEVEMIR  Inject 28 Units into the skin daily with breakfast.     insulin lispro 100 UNIT/ML injection  Commonly known as:   HUMALOG  Inject 2-4 Units into the skin 3 (three) times daily before meals.     ketoconazole 2 % cream  Commonly known as:  NIZORAL  Apply 1 application topically 2 (two) times daily.     labetalol 200 MG tablet  Commonly known as:  NORMODYNE  Take 1.5 tablets (300 mg total) by mouth 3 (three) times daily. Takes 1 and 1/2 tabs     latanoprost 0.005 % ophthalmic solution  Commonly known as:  XALATAN  Place 1 drop into the right eye at bedtime.     levothyroxine 112 MCG tablet  Commonly known as:  SYNTHROID, LEVOTHROID  Take 2 tablets (224 mcg total) by mouth daily with breakfast.     mycophenolate 250 MG capsule  Commonly known as:  CELLCEPT  Take 2 capsules (500 mg total) by mouth 2 (two) times daily.     potassium chloride SA 20 MEQ tablet  Commonly known as:  K-DUR,KLOR-CON  Take 1 tablet (20 mEq total) by mouth 2 (two) times daily.     potassium chloride SA 20 MEQ tablet  Commonly known as:  K-DUR,KLOR-CON  Take 1 tablet (20 mEq total) by mouth daily.     predniSONE 5 MG tablet  Commonly known as:  DELTASONE  Take 2 tablets (10 mg total) by mouth daily with breakfast.     ranitidine 150 MG tablet  Commonly known as:  ZANTAC  Take 1 tablet (150 mg total) by mouth daily.     sirolimus 1 MG tablet  Commonly known as:  RAPAMUNE  Take 3 tablets (3 mg total) by mouth daily. Pt takes three 1 mg tablets = 3 mg daily     urea 40 % Crea  Commonly known as:  CARMOL  Apply 1 application topically 2 (two) times daily. Apply to finger nails, and cuticles.        Hospital Procedures: Dg Chest 2 View  05/18/2013   *RADIOLOGY REPORT*  Clinical Data: Altered mental status.  Back pain.  CHEST - 2 VIEW  Comparison: 01/03/2013.  Findings: Cardiopericardial silhouette remains mildly enlarged, no interval change.  There is no airspace disease or pleural effusion. Diffuse prominence of the interstitium is chronic.  Aortic arch atherosclerosis.  IMPRESSION: Cardiomegaly without  failure.  No change compared to prior.   Original Report Authenticated By: Andreas Newport, M.D.   Ct Head Wo Contrast  05/21/2013   *RADIOLOGY REPORT*  Clinical Data: Confusion  CT HEAD WITHOUT CONTRAST  Technique:  Contiguous axial images were obtained from the base of the skull through the vertex without contrast.  Comparison: 06/27/2012  Findings: The brain shows generalized age related atrophy.  There are mild chronic appearing small vessel changes of the deep white matter.  No cortical or large vessel territory infarction.  No mass lesion, hemorrhage, hydrocephalus or extra-axial collection.  The calvarium is unremarkable.  Sinuses are clear.  There is atherosclerotic calcification of the major vessels at the base of the brain.  IMPRESSION: No acute finding.  Age related atrophy and mild chronic small vessel disease.   Original Report Authenticated By: Paulina Fusi, M.D.    History of Present Illness: Weakness and fever  Hospital Course: This is a 72 year old white female with chronic recurrent bladder infections as well as many other chronic illnesses. She presented again with urinary tract infection with encephalopathy. This is been apparent she has repeated in the past. Based on prior cultures she was started on Rocephin and completed 6 days of this. She's now been transitioned to cefuroxime,.  she does have a penicillin allergy but tolerated cephalosporins well.  Her encephalopathy is waxed and waned. At the present she is close to baseline. She always does poorly in the mornings. Her blood sugars have done well. Her blood pressure is stable. She's had no fever and several days. Her mobility has been minimal but she is refusing to a rehabilitation. Will try to do this in the home setting. Her by mouth intake is fairly good. Her pain is under control.   Day of Discharge Exam BP 178/67  Pulse 71  Temp(Src) 98.4 F (36.9 C) (Oral)  Resp 20  Ht 5\' 6"  (1.676 m)  Wt 63.504 kg (140 lb)  BMI 22.61  kg/m2  SpO2 96%  Physical Exam: General appearance: Chronically ill-appearing but nontoxic black female with periorbital edema Eyes: no scleral icterus Throat: oropharynx moist without erythema Resp: clear without wheezes rales rhonchi, no excess her muscles in use  Cardio: regular and distant with systolic murmur  GI: soft, non-tender; bowel sounds normal; no masses,  no organomegaly , with well-healed scars  Extremities:  1+ edema with diminished pulses. There is a small skin tear on the top of the right foot with a clear bandage on it   she is awake and interactive. Speech is somewhat slowed. She is globally weak with a tremor. She' seems to be  mentating okay.  Discharge Labs:  Recent Labs  05/23/13 0551 05/24/13 0615  NA 141 143  K 3.7 3.3*  CL 108 109  CO2 25 25  GLUCOSE 208* 116*  BUN 24* 22  CREATININE 1.46* 1.34*  CALCIUM 8.8 8.7    Recent Labs  05/22/13 0930  AST 9  ALT <5  ALKPHOS 41  BILITOT 0.4  PROT 5.7*  ALBUMIN 2.7*    Recent Labs  05/22/13 0930  WBC 3.4*  HGB 11.0*  HCT 34.7*  MCV 85.5  PLT 194        Discharge instructions: Continue the antibiotic twice a day for 4 more days. Resume prior diabetes management 1    Disposition: To home  Follow-up Appts: Follow-up with Dr. Evlyn Kanner at [facility] in 1 week.  Call for appointment.  Condition on Discharge: Improved  Tests Needing Follow-up: None  Signed: Kaien Pezzullo [name] 05/24/2013, 8:36 AM

## 2013-05-24 NOTE — Progress Notes (Signed)
OT Cancellation Note  Patient Details Name: [name] MRN: [id number] DOB: [birth year]   Cancelled Treatment:      OT eval not completed secondary to pt with d/c note in chart and plans to be d/c'd home later this am. Will sign off OT at this time.   [name] 05/24/2013, 10:33 AM

## 2013-05-24 NOTE — Progress Notes (Signed)
Physical Therapy Treatment Patient Details Name: [name] MRN: [id number] DOB: 1941/01/04 Today's Date: 05/24/2013 Time: 1308-6578 PT Time Calculation (min): 23 min  PT Assessment / Plan / Recommendation  History of Present Illness Patient is a 72 yo female admitted with UTI with AMS.   PT Comments   Patient continues to fatigue quickly, resulting in minimum distance with gait.  Would recommend SNF however patient refusing.  Recommended to family that patient use her wheelchair/scooter to get from car to inside house.  Recommend HHPT and HH aide at discharge.   Follow Up Recommendations  Home health PT;Supervision/Assistance - 24 hour (Patient refusing SNF)     Does the patient have the potential to tolerate intense rehabilitation     Barriers to Discharge        Equipment Recommendations  None recommended by PT    Recommendations for Other Services    Frequency Min 3X/week   Progress towards PT Goals Progress towards PT goals: Progressing toward goals  Plan Current plan remains appropriate    Precautions / Restrictions Precautions Precautions: Fall Restrictions Weight Bearing Restrictions: No   Pertinent Vitals/Pain     Mobility  Bed Mobility Bed Mobility: Not assessed Transfers Transfers: Sit to Stand;Stand to Sit Sit to Stand: 3: Mod assist;With upper extremity assist;With armrests;From chair/3-in-1 Stand to Sit: 4: Min assist;With upper extremity assist;With armrests;To chair/3-in-1 Details for Transfer Assistance: Patient required repeated verbal and tactile cues for hand placement each time she stood (x3).  Assist to control descent to chair. Ambulation/Gait Ambulation/Gait Assistance: 4: Min assist Ambulation Distance (Feet): 20 Feet (with 2 sitting rest breaks) Assistive device: Rolling walker Ambulation/Gait Assistance Details: Assist to move RW.  Max verbal cues to continue gait.  Patient ambulated 10' as maximum distance without rest break.  Fatigues  quickly. Gait Pattern: Step-to pattern;Decreased stride length;Trunk flexed;Narrow base of support Gait velocity: decreased      PT Goals (current goals can now be found in the care plan section)    Visit Information  Last PT Received On: 05/24/13 Assistance Needed: +1 History of Present Illness: Patient is a 72 yo female admitted with UTI with AMS.    Subjective Data  Subjective: "My legs give out"   Cognition  Cognition Arousal/Alertness: Lethargic Behavior During Therapy: Flat affect Overall Cognitive Status: Impaired/Different from baseline Area of Impairment: Orientation;Attention;Memory;Following commands;Problem solving;Awareness Orientation Level: Disoriented to;Time;Situation Current Attention Level: Sustained Memory: Decreased short-term memory Following Commands: Follows one step commands with increased time Problem Solving: Slow processing;Decreased initiation;Difficulty sequencing;Requires verbal cues;Requires tactile cues    Balance     End of Session PT - End of Session Equipment Utilized During Treatment: Gait belt Activity Tolerance: Patient limited by fatigue Patient left: in chair;with call bell/phone within reach;with family/visitor present Nurse Communication: Mobility status   GP     Vena Austria 05/24/2013, 2:22 PM Durenda Hurt. Renaldo Fiddler, [facility] Acute Rehab Services Pager [phone]

## 2013-06-01 ENCOUNTER — Emergency Department (HOSPITAL_COMMUNITY): Payer: Medicare Other

## 2013-06-01 ENCOUNTER — Emergency Department (HOSPITAL_COMMUNITY)
Admission: EM | Admit: 2013-06-01 | Discharge: 2013-06-01 | Disposition: A | Discharge status: Home / Self Care | Payer: Medicare Other | Attending: Emergency Medicine | Admitting: Emergency Medicine

## 2013-06-01 ENCOUNTER — Encounter (HOSPITAL_COMMUNITY): Payer: Self-pay | Admitting: *Deleted

## 2013-06-01 DIAGNOSIS — E1149 Type 2 diabetes mellitus with other diabetic neurological complication: Secondary | ICD-10-CM | POA: Insufficient documentation

## 2013-06-01 DIAGNOSIS — Z79899 Other long term (current) drug therapy: Secondary | ICD-10-CM | POA: Insufficient documentation

## 2013-06-01 DIAGNOSIS — Z794 Long term (current) use of insulin: Secondary | ICD-10-CM | POA: Insufficient documentation

## 2013-06-01 DIAGNOSIS — Z8739 Personal history of other diseases of the musculoskeletal system and connective tissue: Secondary | ICD-10-CM | POA: Insufficient documentation

## 2013-06-01 DIAGNOSIS — N189 Chronic kidney disease, unspecified: Secondary | ICD-10-CM | POA: Insufficient documentation

## 2013-06-01 DIAGNOSIS — Z872 Personal history of diseases of the skin and subcutaneous tissue: Secondary | ICD-10-CM | POA: Insufficient documentation

## 2013-06-01 DIAGNOSIS — K219 Gastro-esophageal reflux disease without esophagitis: Secondary | ICD-10-CM | POA: Insufficient documentation

## 2013-06-01 DIAGNOSIS — N390 Urinary tract infection, site not specified: Secondary | ICD-10-CM | POA: Insufficient documentation

## 2013-06-01 DIAGNOSIS — E1142 Type 2 diabetes mellitus with diabetic polyneuropathy: Secondary | ICD-10-CM | POA: Insufficient documentation

## 2013-06-01 DIAGNOSIS — Z880 Allergy status to penicillin: Secondary | ICD-10-CM | POA: Insufficient documentation

## 2013-06-01 DIAGNOSIS — Z87891 Personal history of nicotine dependence: Secondary | ICD-10-CM | POA: Insufficient documentation

## 2013-06-01 DIAGNOSIS — IMO0002 Reserved for concepts with insufficient information to code with codable children: Secondary | ICD-10-CM | POA: Insufficient documentation

## 2013-06-01 DIAGNOSIS — Z87448 Personal history of other diseases of urinary system: Secondary | ICD-10-CM | POA: Insufficient documentation

## 2013-06-01 DIAGNOSIS — E039 Hypothyroidism, unspecified: Secondary | ICD-10-CM | POA: Insufficient documentation

## 2013-06-01 DIAGNOSIS — I129 Hypertensive chronic kidney disease with stage 1 through stage 4 chronic kidney disease, or unspecified chronic kidney disease: Secondary | ICD-10-CM | POA: Insufficient documentation

## 2013-06-01 DIAGNOSIS — R011 Cardiac murmur, unspecified: Secondary | ICD-10-CM | POA: Insufficient documentation

## 2013-06-01 DIAGNOSIS — H409 Unspecified glaucoma: Secondary | ICD-10-CM | POA: Insufficient documentation

## 2013-06-01 DIAGNOSIS — F29 Unspecified psychosis not due to a substance or known physiological condition: Secondary | ICD-10-CM | POA: Insufficient documentation

## 2013-06-01 DIAGNOSIS — Z8619 Personal history of other infectious and parasitic diseases: Secondary | ICD-10-CM | POA: Insufficient documentation

## 2013-06-01 DIAGNOSIS — R6883 Chills (without fever): Secondary | ICD-10-CM | POA: Insufficient documentation

## 2013-06-01 LAB — CBC WITH DIFFERENTIAL/PLATELET
Basophils Absolute: 0 10*3/uL (ref 0.0–0.1)
Basophils Relative: 0 % (ref 0–1)
Eosinophils Absolute: 0 10*3/uL (ref 0.0–0.7)
MCH: 27.3 pg (ref 26.0–34.0)
MCHC: 31.9 g/dL (ref 30.0–36.0)
Neutro Abs: 4.2 10*3/uL (ref 1.7–7.7)
Neutrophils Relative %: 84 % — ABNORMAL HIGH (ref 43–77)
Platelets: 276 10*3/uL (ref 150–400)
RDW: 17.1 % — ABNORMAL HIGH (ref 11.5–15.5)

## 2013-06-01 LAB — COMPREHENSIVE METABOLIC PANEL
ALT: 6 U/L (ref 0–35)
Albumin: 3.5 g/dL (ref 3.5–5.2)
Alkaline Phosphatase: 41 U/L (ref 39–117)
BUN: 33 mg/dL — ABNORMAL HIGH (ref 6–23)
Chloride: 97 mEq/L (ref 96–112)
GFR calc Af Amer: 37 mL/min — ABNORMAL LOW (ref >90)
Glucose, Bld: 213 mg/dL — ABNORMAL HIGH (ref 70–99)
Potassium: 4.2 mEq/L (ref 3.5–5.1)
Sodium: 138 mEq/L (ref 135–145)
Total Bilirubin: 0.6 mg/dL (ref 0.3–1.2)
Total Protein: 6.4 g/dL (ref 6.0–8.3)

## 2013-06-01 LAB — URINE MICROSCOPIC-ADD ON

## 2013-06-01 LAB — URINALYSIS, ROUTINE W REFLEX MICROSCOPIC
Bilirubin Urine: NEGATIVE
Hgb urine dipstick: NEGATIVE
Specific Gravity, Urine: 1.009 (ref 1.005–1.030)
Urobilinogen, UA: 0.2 mg/dL (ref 0.0–1.0)
pH: 5.5 (ref 5.0–8.0)

## 2013-06-01 MED ORDER — CEFPODOXIME PROXETIL 200 MG PO TABS: 200.0000 mg | ORAL_TABLET | Freq: Two times a day (BID) | ORAL | Status: DC

## 2013-06-01 MED ORDER — CEFPODOXIME PROXETIL 200 MG PO TABS
200.0000 mg | ORAL_TABLET | ORAL | Status: AC
  Administered 2013-06-01: 200 mg via ORAL
  Filled 2013-06-01: qty 1

## 2013-06-01 NOTE — ED Notes (Signed)
The pt is a kidney transplant pt 7 years ago

## 2013-06-01 NOTE — ED Notes (Signed)
The pt has had a uti for the past 2 weeks.  She was on antibiotics but the symptoms returned when she finished the last med.  For the past few days she has had chills and sl confusion with urinary frequency

## 2013-06-01 NOTE — ED Provider Notes (Signed)
CSN: [id number]     Arrival date & time 06/01/13  1942 History     First MD Initiated Contact with Patient 06/01/13 2113     Chief Complaint  Patient presents with  . poss uti    (Consider location/radiation/quality/duration/timing/severity/associated sxs/prior Treatment) Patient is a 72 y.o. female presenting with general illness. The history is provided by the patient.  Illness Location:  Home, diffuse malaise, chills Quality:  Chills, general malise, mild confusion, not acting right Severity:  Mild Onset quality:  Gradual Duration:  1 day Timing:  Intermittent Progression:  Improving Chronicity:  New Context:  Recurrent UTIs with hx of transplanted kidney. Recent UTI 2 weeks ago requring admission Relieved by:  Nothing Worsened by:  Nothing Associated symptoms: no abdominal pain, no chest pain, no congestion, no cough, no fever, no loss of consciousness and no shortness of breath   Associated symptoms comment:  Occaissonal confusion, improving   Past Medical History  Diagnosis Date  . Diabetes mellitus   . Diabetic neuropathy   . Hypothyroidism   . Hypertension   . GERD (gastroesophageal reflux disease)   . Heart murmur     not treated for  . Arthritis   . Chronic kidney disease     RENAL TRANSPLANT IN 2008--PT IS FOLLOWED BY DR. Karie Fetch WITH BUN OF 37 AND CREAT 1.7--PER NOTE DR. NESI FROM 04/11/12  . Urethral diverticulum   . Eyesight diminished     GLAUCOMA   . History of shingles     LEFT EYE JAN 2012--STILL HAS EYE PAIN-STATES NOT ABLE TO TAKE THE MEDICATION FOR SHINGLES BECAUSE OF HER HX OF KIDNEY TRANSPLANT  . Fingernail abnormalities     FUNGUS OF FINGERNAILS  . UTI (urinary tract infection) 05/18/2013   Past Surgical History  Procedure Laterality Date  . Kidney transplant  2008    At [facility]  . Back surgery      ruptured disk  . Abdominal hysterectomy    . Appendectomy    . Laser surgery of both eyes for hemorrhages    . 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 Urethral diverticulectomy  04/28/2012    Procedure: URETHRAL DIVERTICULECTOMY;  Surgeon: Antony Haste, MD;  Location: [facility] ORS;  Service: Urology;  Laterality: N/A;   Family History  Problem Relation Age of Onset  . Anesthesia problems Neg Hx    History  Substance Use Topics  . Smoking status: Former Smoker -- 0.50 packs/day for 20 years    Quit date: 10/21/1978  . Smokeless tobacco: Never Used  . Alcohol Use: No   OB History   Grav Para Term Preterm Abortions TAB SAB Ect Mult Living                 Review of Systems  Constitutional: Positive for chills. Negative for fever.  HENT: Negative for congestion.   Respiratory: Negative for cough and shortness of breath.   Cardiovascular: Negative for chest pain.  Gastrointestinal: Negative for abdominal pain.  Neurological: Negative for loss of consciousness.  All other systems reviewed and are negative.    Allergies  Adhesive; Aspirin; Hydrocodone; Iohexol; Oxycodone; Penicillins; Tacrolimus; and Contrast media  Home Medications   Current Outpatient Rx  Name  Route  Sig  Dispense  Refill  . amLODipine (NORVASC) 10 MG tablet   Oral   Take 1 tablet (10 mg total) by mouth daily with breakfast.   30 tablet   0   . calcitRIOL (ROCALTROL) 0.5  MCG capsule   Oral   Take 1 capsule (0.5 mcg total) by mouth daily with breakfast.   30 capsule   0   . fentaNYL (DURAGESIC - DOSED MCG/HR) 12 MCG/HR   Transdermal   Place 1 patch onto the skin every 3 (three) days.         . fentaNYL (DURAGESIC - DOSED MCG/HR) 50 MCG/HR   Transdermal   Place 1 patch onto the skin every 3 (three) days.         . furosemide (LASIX) 40 MG tablet   Oral   Take 1 tablet (40 mg total) by mouth 2 (two) times daily.   60 tablet   0   . gabapentin (NEURONTIN) 300 MG capsule   Oral   Take 300 mg by mouth 2 (two) times daily.         . insulin detemir (LEVEMIR) 100 UNIT/ML injection   Subcutaneous   Inject 28  Units into the skin daily with breakfast.         . insulin lispro (HUMALOG) 100 UNIT/ML injection   Subcutaneous   Inject 2-4 Units into the skin 3 (three) times daily before meals.         Marland Kitchen labetalol (NORMODYNE) 200 MG tablet   Oral   Take 1.5 tablets (300 mg total) by mouth 3 (three) times daily. Takes 1 and 1/2 tabs   120 tablet   0   . latanoprost (XALATAN) 0.005 % ophthalmic solution   Both Eyes   Place 1 drop into both eyes at bedtime.         Marland Kitchen levothyroxine (SYNTHROID, LEVOTHROID) 112 MCG tablet   Oral   Take 2 tablets (224 mcg total) by mouth daily with breakfast.   60 tablet   0   . ketoconazole (NIZORAL) 2 % cream   Topical   Apply 1 application topically 2 (two) times daily.         . mycophenolate (CELLCEPT) 250 MG capsule   Oral   Take 2 capsules (500 mg total) by mouth 2 (two) times daily.   120 capsule   0   . potassium chloride SA (K-DUR,KLOR-CON) 20 MEQ tablet   Oral   Take 1 tablet (20 mEq total) by mouth 2 (two) times daily.   60 tablet   0   . potassium chloride SA (K-DUR,KLOR-CON) 20 MEQ tablet   Oral   Take 1 tablet (20 mEq total) by mouth daily.   14 tablet   0   . predniSONE (DELTASONE) 5 MG tablet   Oral   Take 2 tablets (10 mg total) by mouth daily with breakfast.   60 tablet   0   . ranitidine (ZANTAC) 150 MG tablet   Oral   Take 1 tablet (150 mg total) by mouth daily.   30 tablet   0   . sirolimus (RAPAMUNE) 1 MG tablet   Oral   Take 3 tablets (3 mg total) by mouth daily. Pt takes three 1 mg tablets = 3 mg daily   90 tablet   0   . urea (CARMOL) 40 % CREA   Topical   Apply 1 application topically 2 (two) times daily. Apply to finger nails, and cuticles.          BP 120/57  Pulse 74  Temp(Src) 99.7 F (37.6 C)  Resp 20  SpO2 96% Physical Exam  Nursing note and vitals reviewed. Constitutional: She is oriented to person, place, and time.  She appears well-developed and well-nourished. No distress.  HENT:   Head: Normocephalic and atraumatic.  Eyes: EOM are normal. Pupils are equal, round, and reactive to light.  Neck: Normal range of motion. Neck supple.  Cardiovascular: Normal rate and regular rhythm.  Exam reveals no friction rub.   No murmur heard. Pulmonary/Chest: Effort normal and breath sounds normal. No respiratory distress. She has no wheezes. She has no rales.  Abdominal: Soft. She exhibits no distension. There is no tenderness. There is no rebound.  Musculoskeletal: Normal range of motion. She exhibits no edema.  Neurological: She is alert and oriented to person, place, and time.  Skin: She is not diaphoretic.    ED Course   Procedures (including critical care time)  Labs Reviewed  URINALYSIS, ROUTINE W REFLEX MICROSCOPIC - Abnormal; Notable for the following:    APPearance CLOUDY (*)    Glucose, UA 250 (*)    Leukocytes, UA TRACE (*)    All other components within normal limits  COMPREHENSIVE METABOLIC PANEL - Abnormal; Notable for the following:    Glucose, Bld 213 (*)    BUN 33 (*)    Creatinine, Ser 1.54 (*)    GFR calc non Af Amer 32 (*)    GFR calc Af Amer 37 (*)    All other components within normal limits  URINE MICROSCOPIC-ADD ON - Abnormal; Notable for the following:    Squamous Epithelial / LPF FEW (*)    Bacteria, UA FEW (*)    All other components within normal limits  URINE CULTURE  CBC WITH DIFFERENTIAL   Dg Chest 2 View  06/01/2013   *RADIOLOGY REPORT*  Clinical Data: Weakness  CHEST - 2 VIEW  Comparison: 05/18/2013  Findings: Mild enlargement of the cardiac silhouette noted without focal pulmonary opacification.  No pleural effusion.  Aortic calcification identified without calcified aneurysm.  No acute osseous finding allowing for technique.  Left glenohumeral degenerative change identified, not well visualized.  IMPRESSION: Cardiomegaly without focal acute finding.   Original Report Authenticated By: Christiana Pellant, M.D.   1. UTI (urinary tract  infection)     MDM   34F with hx of renal transplant presents with confusion and concern for UTI. Husband states last UTI recently, admitted by Dr. Adrian Prince at that time. The UA was neg nitrites and moderate leukocytes at that time. Some mild confusion per husband, here alert and oriented. No abdominal pain. 99.7 temp orally, will get rectal temp. UA here with trace Leukocytes. With hx of UTIs, once labs return, will call PCP and discuss.  PCP states to go ahead and treat for presumptive UTI and they will see her in f/u tomorrow. No white count, afebrile rectally. Given Rx for Vantin - in chart has allergy to PCN - has taken Rocephin in the past. Stable for discharge, family and patient agreeable with this plan.   Dagmar Hait, MD 06/02/13 1006

## 2013-06-03 LAB — URINE CULTURE

## 2013-06-27 ENCOUNTER — Inpatient Hospital Stay (HOSPITAL_COMMUNITY)
Admission: EM | Admit: 2013-06-27 | Discharge: 2013-07-05 | DRG: 193 | Disposition: A | Discharge status: Skilled Nursing Facility | Payer: Medicare Other | Attending: Endocrinology | Admitting: Endocrinology

## 2013-06-27 ENCOUNTER — Other Ambulatory Visit (HOSPITAL_COMMUNITY): Payer: Medicare Other

## 2013-06-27 ENCOUNTER — Encounter (HOSPITAL_COMMUNITY): Payer: Self-pay | Admitting: *Deleted

## 2013-06-27 ENCOUNTER — Emergency Department (HOSPITAL_COMMUNITY): Payer: Medicare Other

## 2013-06-27 DIAGNOSIS — R509 Fever, unspecified: Secondary | ICD-10-CM

## 2013-06-27 DIAGNOSIS — I129 Hypertensive chronic kidney disease with stage 1 through stage 4 chronic kidney disease, or unspecified chronic kidney disease: Secondary | ICD-10-CM | POA: Diagnosis present

## 2013-06-27 DIAGNOSIS — F329 Major depressive disorder, single episode, unspecified: Secondary | ICD-10-CM | POA: Diagnosis present

## 2013-06-27 DIAGNOSIS — R4182 Altered mental status, unspecified: Secondary | ICD-10-CM

## 2013-06-27 DIAGNOSIS — E1129 Type 2 diabetes mellitus with other diabetic kidney complication: Secondary | ICD-10-CM | POA: Diagnosis present

## 2013-06-27 DIAGNOSIS — G9340 Encephalopathy, unspecified: Secondary | ICD-10-CM | POA: Diagnosis present

## 2013-06-27 DIAGNOSIS — K219 Gastro-esophageal reflux disease without esophagitis: Secondary | ICD-10-CM | POA: Diagnosis present

## 2013-06-27 DIAGNOSIS — Z794 Long term (current) use of insulin: Secondary | ICD-10-CM

## 2013-06-27 DIAGNOSIS — H5440 Blindness, one eye, unspecified eye: Secondary | ICD-10-CM | POA: Diagnosis present

## 2013-06-27 DIAGNOSIS — E873 Alkalosis: Secondary | ICD-10-CM | POA: Diagnosis present

## 2013-06-27 DIAGNOSIS — F0390 Unspecified dementia without behavioral disturbance: Secondary | ICD-10-CM | POA: Diagnosis present

## 2013-06-27 DIAGNOSIS — R1310 Dysphagia, unspecified: Secondary | ICD-10-CM | POA: Diagnosis present

## 2013-06-27 DIAGNOSIS — N183 Chronic kidney disease, stage 3 unspecified: Secondary | ICD-10-CM | POA: Diagnosis present

## 2013-06-27 DIAGNOSIS — E1149 Type 2 diabetes mellitus with other diabetic neurological complication: Secondary | ICD-10-CM | POA: Diagnosis present

## 2013-06-27 DIAGNOSIS — E1169 Type 2 diabetes mellitus with other specified complication: Secondary | ICD-10-CM | POA: Diagnosis present

## 2013-06-27 DIAGNOSIS — E1142 Type 2 diabetes mellitus with diabetic polyneuropathy: Secondary | ICD-10-CM | POA: Diagnosis present

## 2013-06-27 DIAGNOSIS — D631 Anemia in chronic kidney disease: Secondary | ICD-10-CM | POA: Diagnosis present

## 2013-06-27 DIAGNOSIS — E46 Unspecified protein-calorie malnutrition: Secondary | ICD-10-CM | POA: Diagnosis present

## 2013-06-27 DIAGNOSIS — J189 Pneumonia, unspecified organism: Principal | ICD-10-CM | POA: Diagnosis present

## 2013-06-27 DIAGNOSIS — Z940 Kidney transplant status: Secondary | ICD-10-CM

## 2013-06-27 DIAGNOSIS — I359 Nonrheumatic aortic valve disorder, unspecified: Secondary | ICD-10-CM | POA: Diagnosis present

## 2013-06-27 DIAGNOSIS — F3289 Other specified depressive episodes: Secondary | ICD-10-CM | POA: Diagnosis present

## 2013-06-27 DIAGNOSIS — N289 Disorder of kidney and ureter, unspecified: Secondary | ICD-10-CM

## 2013-06-27 DIAGNOSIS — E039 Hypothyroidism, unspecified: Secondary | ICD-10-CM | POA: Diagnosis present

## 2013-06-27 DIAGNOSIS — R627 Adult failure to thrive: Secondary | ICD-10-CM | POA: Diagnosis present

## 2013-06-27 DIAGNOSIS — E876 Hypokalemia: Secondary | ICD-10-CM | POA: Diagnosis present

## 2013-06-27 DIAGNOSIS — Z87891 Personal history of nicotine dependence: Secondary | ICD-10-CM

## 2013-06-27 DIAGNOSIS — N390 Urinary tract infection, site not specified: Secondary | ICD-10-CM | POA: Diagnosis present

## 2013-06-27 DIAGNOSIS — M109 Gout, unspecified: Secondary | ICD-10-CM | POA: Diagnosis present

## 2013-06-27 DIAGNOSIS — R5381 Other malaise: Secondary | ICD-10-CM | POA: Diagnosis present

## 2013-06-27 DIAGNOSIS — Z79899 Other long term (current) drug therapy: Secondary | ICD-10-CM

## 2013-06-27 LAB — URINALYSIS, ROUTINE W REFLEX MICROSCOPIC
Bilirubin Urine: NEGATIVE
Nitrite: NEGATIVE
Protein, ur: 30 mg/dL — AB
Specific Gravity, Urine: 1.014 (ref 1.005–1.030)
Urobilinogen, UA: 0.2 mg/dL (ref 0.0–1.0)

## 2013-06-27 LAB — CBC WITH DIFFERENTIAL/PLATELET
Basophils Absolute: 0 10*3/uL (ref 0.0–0.1)
Eosinophils Absolute: 0 10*3/uL (ref 0.0–0.7)
Lymphs Abs: 0.8 10*3/uL (ref 0.7–4.0)
MCHC: 33.4 g/dL (ref 30.0–36.0)
MCV: 85 fL (ref 78.0–100.0)
Monocytes Relative: 2 % — ABNORMAL LOW (ref 3–12)
Neutro Abs: 11.9 10*3/uL — ABNORMAL HIGH (ref 1.7–7.7)
Platelets: 185 10*3/uL (ref 150–400)
RDW: 16.7 % — ABNORMAL HIGH (ref 11.5–15.5)
WBC: 13 10*3/uL — ABNORMAL HIGH (ref 4.0–10.5)

## 2013-06-27 LAB — COMPREHENSIVE METABOLIC PANEL
BUN: 33 mg/dL — ABNORMAL HIGH (ref 6–23)
Chloride: 97 mEq/L (ref 96–112)
GFR calc Af Amer: 32 mL/min — ABNORMAL LOW (ref >90)
GFR calc non Af Amer: 27 mL/min — ABNORMAL LOW (ref >90)
Potassium: 3.9 mEq/L (ref 3.5–5.1)
Sodium: 142 mEq/L (ref 135–145)
Total Bilirubin: 0.8 mg/dL (ref 0.3–1.2)

## 2013-06-27 LAB — GLUCOSE, CAPILLARY: Glucose-Capillary: 210 mg/dL — ABNORMAL HIGH (ref 70–99)

## 2013-06-27 LAB — URINE MICROSCOPIC-ADD ON

## 2013-06-27 MED ORDER — SIROLIMUS 1 MG PO TABS
3.0000 mg | ORAL_TABLET | Freq: Every day | ORAL | Status: DC
  Administered 2013-06-28 – 2013-07-05 (×8): 3 mg via ORAL
  Filled 2013-06-27 (×8): qty 3

## 2013-06-27 MED ORDER — LEVOTHYROXINE SODIUM 112 MCG PO TABS
224.0000 ug | ORAL_TABLET | Freq: Every day | ORAL | Status: DC
  Administered 2013-06-28 – 2013-07-05 (×8): 224 ug via ORAL
  Filled 2013-06-27 (×10): qty 2

## 2013-06-27 MED ORDER — CALCITRIOL 0.5 MCG PO CAPS
0.5000 ug | ORAL_CAPSULE | Freq: Every day | ORAL | Status: DC
  Administered 2013-06-28 – 2013-07-05 (×8): 0.5 ug via ORAL
  Filled 2013-06-27 (×10): qty 1

## 2013-06-27 MED ORDER — INSULIN DETEMIR 100 UNIT/ML ~~LOC~~ SOLN
14.0000 [IU] | Freq: Every day | SUBCUTANEOUS | Status: DC
  Filled 2013-06-27: qty 0.14

## 2013-06-27 MED ORDER — FENTANYL 50 MCG/HR TD PT72
50.0000 ug | MEDICATED_PATCH | TRANSDERMAL | Status: DC
Start: 2013-06-27 — End: 2013-07-05
  Administered 2013-06-27 – 2013-07-03 (×3): 50 ug via TRANSDERMAL
  Filled 2013-06-27 (×3): qty 1

## 2013-06-27 MED ORDER — ACETAMINOPHEN 650 MG RE SUPP: 650.0000 mg | Freq: Four times a day (QID) | RECTAL | Status: DC | PRN

## 2013-06-27 MED ORDER — MYCOPHENOLATE MOFETIL 250 MG PO CAPS
500.0000 mg | ORAL_CAPSULE | Freq: Two times a day (BID) | ORAL | Status: DC
  Administered 2013-06-27 – 2013-07-05 (×15): 500 mg via ORAL
  Filled 2013-06-27 (×17): qty 2

## 2013-06-27 MED ORDER — INSULIN ASPART 100 UNIT/ML ~~LOC~~ SOLN
0.0000 [IU] | Freq: Three times a day (TID) | SUBCUTANEOUS | Status: DC
  Administered 2013-06-28: 19:00:00 2 [IU] via SUBCUTANEOUS
  Administered 2013-06-29 (×2): 3 [IU] via SUBCUTANEOUS
  Administered 2013-06-29 – 2013-06-30 (×2): 5 [IU] via SUBCUTANEOUS
  Administered 2013-06-30: 7 [IU] via SUBCUTANEOUS
  Administered 2013-06-30 – 2013-07-01 (×2): 3 [IU] via SUBCUTANEOUS
  Administered 2013-07-01: 5 [IU] via SUBCUTANEOUS
  Administered 2013-07-01: 08:00:00 2 [IU] via SUBCUTANEOUS
  Administered 2013-07-02 (×2): 3 [IU] via SUBCUTANEOUS
  Administered 2013-07-03: 09:00:00 1 [IU] via SUBCUTANEOUS
  Administered 2013-07-03: 3 [IU] via SUBCUTANEOUS
  Administered 2013-07-03: 5 [IU] via SUBCUTANEOUS
  Administered 2013-07-04: 3 [IU] via SUBCUTANEOUS
  Administered 2013-07-04 (×2): 2 [IU] via SUBCUTANEOUS
  Administered 2013-07-05: 3 [IU] via SUBCUTANEOUS

## 2013-06-27 MED ORDER — LATANOPROST 0.005 % OP SOLN
1.0000 [drp] | Freq: Every day | OPHTHALMIC | Status: DC
  Administered 2013-06-28 – 2013-07-04 (×6): 1 [drp] via OPHTHALMIC
  Filled 2013-06-27: qty 2.5

## 2013-06-27 MED ORDER — VANCOMYCIN HCL IN DEXTROSE 1-5 GM/200ML-% IV SOLN
1000.0000 mg | Freq: Once | INTRAVENOUS | Status: AC
  Administered 2013-06-27: 1000 mg via INTRAVENOUS
  Filled 2013-06-27: qty 200

## 2013-06-27 MED ORDER — ACETAMINOPHEN 325 MG PO TABS: 650.0000 mg | ORAL_TABLET | Freq: Four times a day (QID) | ORAL | Status: DC | PRN

## 2013-06-27 MED ORDER — PREDNISONE 10 MG PO TABS
10.0000 mg | ORAL_TABLET | Freq: Every day | ORAL | Status: DC
  Administered 2013-06-28 – 2013-07-05 (×8): 10 mg via ORAL
  Filled 2013-06-27 (×10): qty 1

## 2013-06-27 MED ORDER — ENOXAPARIN SODIUM 30 MG/0.3ML ~~LOC~~ SOLN
30.0000 mg | SUBCUTANEOUS | Status: DC
  Administered 2013-06-27 – 2013-07-04 (×8): 30 mg via SUBCUTANEOUS
  Filled 2013-06-27 (×9): qty 0.3

## 2013-06-27 MED ORDER — SODIUM CHLORIDE 0.45 % IV SOLN
INTRAVENOUS | Status: DC
  Administered 2013-06-27: 22:00:00 via INTRAVENOUS

## 2013-06-27 MED ORDER — AMLODIPINE BESYLATE 10 MG PO TABS
10.0000 mg | ORAL_TABLET | Freq: Every day | ORAL | Status: DC
  Administered 2013-06-28 – 2013-07-05 (×8): 10 mg via ORAL
  Filled 2013-06-27 (×10): qty 1

## 2013-06-27 MED ORDER — FAMOTIDINE 20 MG PO TABS
20.0000 mg | ORAL_TABLET | Freq: Every day | ORAL | Status: DC
  Administered 2013-06-28 – 2013-07-05 (×8): 20 mg via ORAL
  Filled 2013-06-27 (×8): qty 1

## 2013-06-27 MED ORDER — KETOCONAZOLE 2 % EX CREA
1.0000 "application " | TOPICAL_CREAM | Freq: Two times a day (BID) | CUTANEOUS | Status: DC
  Administered 2013-06-27 – 2013-07-05 (×16): 1 via TOPICAL
  Filled 2013-06-27 (×2): qty 15

## 2013-06-27 MED ORDER — ACETAMINOPHEN 325 MG PO TABS: 650.0000 mg | ORAL_TABLET | Freq: Once | ORAL | Status: DC

## 2013-06-27 MED ORDER — CEFTRIAXONE SODIUM 1 G IJ SOLR
1.0000 g | INTRAMUSCULAR | Status: DC
  Administered 2013-06-27 – 2013-07-04 (×8): 1 g via INTRAVENOUS
  Filled 2013-06-27 (×10): qty 10

## 2013-06-27 MED ORDER — LABETALOL HCL 300 MG PO TABS
300.0000 mg | ORAL_TABLET | Freq: Three times a day (TID) | ORAL | Status: DC
  Administered 2013-06-27: 300 mg via ORAL
  Filled 2013-06-27 (×4): qty 1

## 2013-06-27 MED ORDER — GABAPENTIN 300 MG PO CAPS
300.0000 mg | ORAL_CAPSULE | Freq: Two times a day (BID) | ORAL | Status: DC
  Administered 2013-06-27 – 2013-06-28 (×2): 300 mg via ORAL
  Filled 2013-06-27 (×3): qty 1

## 2013-06-27 MED ORDER — FAMOTIDINE 20 MG PO TABS
20.0000 mg | ORAL_TABLET | Freq: Every day | ORAL | Status: DC
  Filled 2013-06-27: qty 1

## 2013-06-27 MED ORDER — ACETAMINOPHEN 650 MG RE SUPP
650.0000 mg | Freq: Once | RECTAL | Status: AC
  Administered 2013-06-27: 650 mg via RECTAL
  Filled 2013-06-27: qty 1

## 2013-06-27 NOTE — ED Notes (Signed)
IV team called and notified that pt is very sick, need IV now.

## 2013-06-27 NOTE — H&P (Signed)
PCP:   Julian Hy, MD   Chief Complaint: Altered mental status with shaking and tremors, associated with some nausea and vomiting but no perceived fevers consistent with previous UTI  HPI: This is a 72 year old female with chronic kidney disease status post renal transplant in 2008, receiving temporary dialysis through a catheter in her left upper extremity, currently on immunosuppressive therapy and multiple hospitalizations for recurrent UTI associated with altered mental status. At baseline she does live at home with her husband, able to transport himself with a walker, complicated by some low back discomfort with history of herniated disc and surgery. She does have some baseline dementia and was scheduled to be evaluated by neurology this coming week. She has had multiple enterococcal UTIs, treated with a variety of antibiotic regimens, with some cultures growing out multiple pathogens. Recently she has been treated by nephrology with Macrobid per patient husband report, as soon as 7-10 days ago. However over the last one week, patient has developed worsening problems with mental status changes, unable to mobilize her needs and wants, now not ambulating, with nausea and anorexia. Review of systems not able to be obtained from patient however husband states that he does not see any focal neurologic deficits over the last one week, and again she did eat a small amount of food this morning but this is aggressively, worse over the day she continues to take her medications, and did have one episode of hypoglycemia as a result of her decreased caloric intake. Prior to incapacitation, patient did not have any pain complaints per husband's report.  Review of Systems:  Unable to obtain secondary to mental status changes  Past Medical History: Past Medical History  Diagnosis Date  . Diabetes mellitus   . Diabetic neuropathy   . Hypothyroidism   . Hypertension   . GERD (gastroesophageal reflux  disease)   . Heart murmur     not treated for  . Arthritis   . Chronic kidney disease     RENAL TRANSPLANT IN 2008--PT IS FOLLOWED BY DR. Karie Fetch WITH BUN OF 37 AND CREAT 1.7--PER NOTE DR. NESI FROM 04/11/12  . Urethral diverticulum   . Eyesight diminished     GLAUCOMA   . History of shingles     LEFT EYE JAN 2012--STILL HAS EYE PAIN-STATES NOT ABLE TO TAKE THE MEDICATION FOR SHINGLES BECAUSE OF HER HX OF KIDNEY TRANSPLANT  . Fingernail abnormalities     FUNGUS OF FINGERNAILS  . UTI (urinary tract infection) 05/18/2013   Past Surgical History  Procedure Laterality Date  . Kidney transplant  2008    At [facility]  . Back surgery      ruptured disk  . Abdominal hysterectomy    . Appendectomy    . Laser surgery of both eyes for hemorrhages    . 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 Urethral diverticulectomy  04/28/2012    Procedure: URETHRAL DIVERTICULECTOMY;  Surgeon: Antony Haste, MD;  Location: [facility];  Service: Urology;  Laterality: N/A;    Medications: Prior to Admission medications   Medication Sig Start Date End Date Taking? Authorizing Provider  amLODipine (NORVASC) 10 MG tablet Take 1 tablet (10 mg total) by mouth daily with breakfast. 02/10/13  Yes Claudie Revering, NP  calcitRIOL (ROCALTROL) 0.5 MCG capsule Take 1 capsule (0.5 mcg total) by mouth daily with breakfast. 02/10/13  Yes Claudie Revering, NP  fentaNYL (DURAGESIC - DOSED MCG/HR) 50 MCG/HR Place 1 patch onto the skin  every 3 (three) days.   Yes Historical Provider, MD  furosemide (LASIX) 40 MG tablet Take 80 mg by mouth 2 (two) times daily.   Yes Historical Provider, MD  gabapentin (NEURONTIN) 300 MG capsule Take 300 mg by mouth 2 (two) times daily.   Yes Historical Provider, MD  insulin detemir (LEVEMIR) 100 UNIT/ML injection Inject 28 Units into the skin daily with breakfast.   Yes Historical Provider, MD  insulin lispro (HUMALOG) 100 UNIT/ML injection Inject 2-4 Units into the skin 3 (three)  times daily before meals.   Yes Historical Provider, MD  ketoconazole (NIZORAL) 2 % cream Apply 1 application topically 2 (two) times daily.   Yes Historical Provider, MD  labetalol (NORMODYNE) 200 MG tablet Take 1.5 tablets (300 mg total) by mouth 3 (three) times daily. Takes 1 and 1/2 tabs 02/10/13  Yes Claudie Revering, NP  latanoprost (XALATAN) 0.005 % ophthalmic solution Place 1 drop into both eyes at bedtime.   Yes Historical Provider, MD  levothyroxine (SYNTHROID, LEVOTHROID) 112 MCG tablet Take 2 tablets (224 mcg total) by mouth daily with breakfast. 02/10/13  Yes Claudie Revering, NP  mycophenolate (CELLCEPT) 250 MG capsule Take 2 capsules (500 mg total) by mouth 2 (two) times daily. 02/10/13  Yes Claudie Revering, NP  potassium chloride SA (K-DUR,KLOR-CON) 20 MEQ tablet Take 1 tablet (20 mEq total) by mouth daily. 05/24/13  Yes Julian Hy, MD  predniSONE (DELTASONE) 5 MG tablet Take 2 tablets (10 mg total) by mouth daily with breakfast. 02/10/13  Yes Claudie Revering, NP  ranitidine (ZANTAC) 150 MG tablet Take 1 tablet (150 mg total) by mouth daily. 02/10/13  Yes Claudie Revering, NP  sirolimus (RAPAMUNE) 1 MG tablet Take 3 tablets (3 mg total) by mouth daily. Pt takes three 1 mg tablets = 3 mg daily 02/10/13  Yes Claudie Revering, NP  urea (CARMOL) 40 % CREA Apply 1 application topically 2 (two) times daily. Apply to finger nails, and cuticles.   Yes Historical Provider, MD    Allergies:   Allergies  Allergen Reactions  . Adhesive [Tape]     Skin irritation, "pulls the skin off"   . Aspirin     Crawling feeling under skin  . Hydrocodone Other (See Comments)    Hallucinations   . Iohexol      Desc: pt unable to tell what the reaction was   . Oxycodone     hallucinations  . Penicillins Hives and Swelling    Has tolerated cefazolin and ceftriaxone.  . Tacrolimus Other (See Comments)    Hallucinations   . Contrast Media [Iodinated Diagnostic Agents] Hives, Swelling and Rash     Social History:  reports that she quit smoking about 34 years ago. She has never used smokeless tobacco. She reports that she does not drink alcohol or use illicit drugs.  Family History: Family History  Problem Relation Age of Onset  . Anesthesia problems Neg Hx     Physical Exam: Filed Vitals:   06/27/13 1645 06/27/13 1801 06/27/13 1815 06/27/13 1830  BP:  139/93 148/55   Pulse: 94  88   Temp:    103 F (39.4 C)  TempSrc:  Oral  Rectal  Resp: 16 15 17    SpO2:  99% 99%    General appearance-chronically ill-appearing, not following commands however did open her mouth when prompted which was surprising, nonverbal No acute distress, head cephalic normocephalic/atraumatic Is resistant to looking into the left eye, chronically  affected by shingles and vision loss per husband's report, evaluation of the right eye did reveal reasonable extraconal movements however not to the right No oropharyngeal lesions Tympanic membranes clear bilaterally with a moderate amount of cerumen X line neck nontender, supple, no cervical lymphadenopathy next lungs are clear to auscultation, no bronchospasm or wheezing discernible Cardiovascular reveals a regular rate and rhythm with mild murmur Abdomen-soft, nontender, nondistended, bowel sounds are present, no hepatosplenomegaly appreciated No suprapubic tenderness Extremities reveal minimal nonpitting edema, no cyanosis, capillary refill intact No rash discernible Patient is somewhat arousable but does not follow commands except over the mouth, does have some generalized tremors, which husband states are consistent with previous UTIs, has never been known to have been a seizure disorder H. Pt is able to move all 4 extremities albeit not to command, no perceived sensory deficits  Labs on Admission:   Recent Labs  06/27/13 1540  NA 142  K 3.9  CL 97  CO2 34*  GLUCOSE 197*  BUN 33*  CREATININE 1.76*  CALCIUM 10.0    Recent Labs   06/27/13 1540  AST 18  ALT 7  ALKPHOS 41  BILITOT 0.8  PROT 7.0  ALBUMIN 3.5   No results found for this basename: LIPASE, AMYLASE,  in the last 72 hours  Recent Labs  06/27/13 1552  WBC 13.0*  NEUTROABS 11.9*  HGB 13.4  HCT 40.1  MCV 85.0  PLT 185   No results found for this basename: CKTOTAL, CKMB, CKMBINDEX, TROPONINI,  in the last 72 hours No results found for this basename: TSH, T4TOTAL, FREET3, T3FREE, THYROIDAB,  in the last 72 hours No results found for this basename: VITAMINB12, FOLATE, FERRITIN, TIBC, IRON, RETICCTPCT,  in the last 72 hours  Radiological Exams on Admission: Dg Chest 2 View  06/01/2013   *RADIOLOGY REPORT*  Clinical Data: Weakness  CHEST - 2 VIEW  Comparison: 05/18/2013  Findings: Mild enlargement of the cardiac silhouette noted without focal pulmonary opacification.  No pleural effusion.  Aortic calcification identified without calcified aneurysm.  No acute osseous finding allowing for technique.  Left glenohumeral degenerative change identified, not well visualized.  IMPRESSION: Cardiomegaly without focal acute finding.   Original Report Authenticated By: Christiana Pellant, M.D.   Ct Head Wo Contrast  06/27/2013   CLINICAL DATA:  Altered mental status. Diabetes. Emesis.  EXAM: CT HEAD WITHOUT CONTRAST  TECHNIQUE: Contiguous axial images were obtained from the base of the skull through the vertex without intravenous contrast.  COMPARISON:  05/21/2013  FINDINGS: The brainstem, cerebellum, cerebral peduncles, thalamus, basal ganglia, basilar cisterns, and ventricular system appear within normal limits. Periventricular white matter and corona radiata hypodensities favor chronic ischemic microvascular white matter disease. No intracranial hemorrhage, mass lesion, or acute CVA.  Atherosclerotic calcification of the carotid siphons noted.  IMPRESSION: 1. No acute intracranial findings. 2. Chronic ischemic microvascular white matter disease.   Electronically Signed   By:  Herbie Baltimore   On: 06/27/2013 17:38   Dg Chest Port 1 View  06/27/2013   *RADIOLOGY REPORT*  Clinical Data: Altered mental status.  Vomiting.  PORTABLE CHEST - 1 VIEW  Comparison: 06/01/2013  Findings: Chin overlies the apices. Cardiomegaly accentuated by AP portable technique.  No pleural fluid.  No definite pneumothorax.  Right infrahilar airspace disease.  Minimal left base volume loss.  IMPRESSION: Right infrahilar airspace disease, suspicious for infection or aspiration.  Cardiomegaly without congestive failure.   Original Report Authenticated By: Jeronimo Greaves, M.D.   Orders placed  during the hospital encounter of 06/27/13  . ED EKG  . ED EKG    Assessment/Plan Fever-up to 103F-consistent with leukocytosis, question recurrent UTI versus pulmonary status, see below, will treat with broad-spectrum antibiotics, monitor cultures, physical exam unremarkable for any other evidence of infection at this time Altered mental status-history of the same, consistent with previous workup revealing recurrent UTI, hemodynamically stable, lactic acid early elevated, will treat with IV fluids, monitor conservatively, head CT unremarkable when compared with previous History of recurrent UTI-based on multiple cultures, enterococcus predominate, penicillin allergy, will treat with relatively broad-spectrum antibiotics awaiting results of urine culture, blood cultures, will treat with vancomycin and Rocephin, catheterized specimen pending Pneumonia-no respiratory distress, no cough, question technique with single view chest x-ray versus active infiltrate, will cover empirically and will followup with repeat chest x-ray Diabetes mellitus with microvascular complications-continue home medication regimen at reduced dose given anorexia, continue sliding scale insulin Hypothyroidism-continue home medication regimen check TSH Hypertension-currently hemodynamically stable, continue home medications hold for systolic  blood pressure less than 1 GERD-continue H2-blocker Chronic kidney disease status post renal transplant now with stage III chronic kidney disease followed by nephrology Underlying dementia Gait abnormality with history of back surgery, ruptured disc, on chronic pain medications, using a walker at baseline, no recent falls per husband report   Margarito Dehaas R 06/27/2013, 7:06 PM

## 2013-06-27 NOTE — ED Provider Notes (Signed)
CSN: [id number]     Arrival date & time 06/27/13  1530 History   First MD Initiated Contact with Patient 06/27/13 1549     Chief Complaint  Patient presents with  . Altered Mental Status  . Emesis   (Consider location/radiation/quality/duration/timing/severity/associated sxs/prior Treatment) Patient is a 72 y.o. female presenting with altered mental status and vomiting. The history is provided by the spouse (Altered mental statusa).  Altered Mental Status Associated symptoms: vomiting   Emesis She started having shaking today as well as some nausea and vomiting. She's not had a fever. Her husband states that she has been having trouble speaking for the last week. Trouble speaking included in not being able to ask questions not being able to find the right words. She is currently being treated for urinary tract infection with nitrofurantoin and she has had multiple infections requiring frequent courses of antibiotics. She has some mild diarrhea which she frequently has when she is taking antibiotic. She is status post renal transplant. Nothing makes her symptoms better and nothing makes them worse.  Past Medical History  Diagnosis Date  . Diabetes mellitus   . Diabetic neuropathy   . Hypothyroidism   . Hypertension   . GERD (gastroesophageal reflux disease)   . Heart murmur     not treated for  . Arthritis   . Chronic kidney disease     RENAL TRANSPLANT IN 2008--PT IS FOLLOWED BY DR. Karie Fetch WITH BUN OF 37 AND CREAT 1.7--PER NOTE DR. NESI FROM 04/11/12  . Urethral diverticulum   . Eyesight diminished     GLAUCOMA   . History of shingles     LEFT EYE JAN 2012--STILL HAS EYE PAIN-STATES NOT ABLE TO TAKE THE MEDICATION FOR SHINGLES BECAUSE OF HER HX OF KIDNEY TRANSPLANT  . Fingernail abnormalities     FUNGUS OF FINGERNAILS  . UTI (urinary tract infection) 05/18/2013   Past Surgical History  Procedure Laterality Date  . Kidney transplant  2008    At [facility]  . Back  surgery      ruptured disk  . Abdominal hysterectomy    . Appendectomy    . Laser surgery of both eyes for hemorrhages    . 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 Urethral diverticulectomy  04/28/2012    Procedure: URETHRAL DIVERTICULECTOMY;  Surgeon: Antony Haste, MD;  Location: [facility];  Service: Urology;  Laterality: N/A;   Family History  Problem Relation Age of Onset  . Anesthesia problems Neg Hx    History  Substance Use Topics  . Smoking status: Former Smoker -- 0.50 packs/day for 20 years    Quit date: 10/21/1978  . Smokeless tobacco: Never Used  . Alcohol Use: No   OB History   Grav Para Term Preterm Abortions TAB SAB Ect Mult Living                 Review of Systems  Unable to perform ROS: Mental status change  Gastrointestinal: Positive for vomiting.    Allergies  Adhesive; Aspirin; Hydrocodone; Iohexol; Oxycodone; Penicillins; Tacrolimus; and Contrast media  Home Medications   Current Outpatient Rx  Name  Route  Sig  Dispense  Refill  . amLODipine (NORVASC) 10 MG tablet   Oral   Take 1 tablet (10 mg total) by mouth daily with breakfast.   30 tablet   0   . calcitRIOL (ROCALTROL) 0.5 MCG capsule   Oral   Take 1 capsule (0.5 mcg total) by mouth  daily with breakfast.   30 capsule   0   . cefpodoxime (VANTIN) 200 MG tablet   Oral   Take 1 tablet (200 mg total) by mouth 2 (two) times daily.   20 tablet   0   . fentaNYL (DURAGESIC - DOSED MCG/HR) 12 MCG/HR   Transdermal   Place 1 patch onto the skin every 3 (three) days.         . fentaNYL (DURAGESIC - DOSED MCG/HR) 50 MCG/HR   Transdermal   Place 1 patch onto the skin every 3 (three) days.         . furosemide (LASIX) 40 MG tablet   Oral   Take 1 tablet (40 mg total) by mouth 2 (two) times daily.   60 tablet   0   . gabapentin (NEURONTIN) 300 MG capsule   Oral   Take 300 mg by mouth 2 (two) times daily.         . insulin detemir (LEVEMIR) 100 UNIT/ML injection    Subcutaneous   Inject 28 Units into the skin daily with breakfast.         . insulin lispro (HUMALOG) 100 UNIT/ML injection   Subcutaneous   Inject 2-4 Units into the skin 3 (three) times daily before meals.         Marland Kitchen ketoconazole (NIZORAL) 2 % cream   Topical   Apply 1 application topically 2 (two) times daily.         Marland Kitchen labetalol (NORMODYNE) 200 MG tablet   Oral   Take 1.5 tablets (300 mg total) by mouth 3 (three) times daily. Takes 1 and 1/2 tabs   120 tablet   0   . latanoprost (XALATAN) 0.005 % ophthalmic solution   Both Eyes   Place 1 drop into both eyes at bedtime.         Marland Kitchen levothyroxine (SYNTHROID, LEVOTHROID) 112 MCG tablet   Oral   Take 2 tablets (224 mcg total) by mouth daily with breakfast.   60 tablet   0   . mycophenolate (CELLCEPT) 250 MG capsule   Oral   Take 2 capsules (500 mg total) by mouth 2 (two) times daily.   120 capsule   0   . potassium chloride SA (K-DUR,KLOR-CON) 20 MEQ tablet   Oral   Take 1 tablet (20 mEq total) by mouth 2 (two) times daily.   60 tablet   0   . potassium chloride SA (K-DUR,KLOR-CON) 20 MEQ tablet   Oral   Take 1 tablet (20 mEq total) by mouth daily.   14 tablet   0   . predniSONE (DELTASONE) 5 MG tablet   Oral   Take 2 tablets (10 mg total) by mouth daily with breakfast.   60 tablet   0   . ranitidine (ZANTAC) 150 MG tablet   Oral   Take 1 tablet (150 mg total) by mouth daily.   30 tablet   0   . sirolimus (RAPAMUNE) 1 MG tablet   Oral   Take 3 tablets (3 mg total) by mouth daily. Pt takes three 1 mg tablets = 3 mg daily   90 tablet   0   . urea (CARMOL) 40 % CREA   Topical   Apply 1 application topically 2 (two) times daily. Apply to finger nails, and cuticles.          BP 149/77  Pulse 100  Temp(Src) 99.7 F (37.6 C) (Oral)  Resp 24  SpO2  79% Physical Exam  Nursing note and vitals reviewed.  72 year old female, resting comfortably and in no acute distress. Vital signs are  significant for tachypnea with respiratory rate of 24, and hypertension with blood pressure 149/77. Oxygen saturation is 79%, which is hypoxic. Head is normocephalic and atraumatic. PERRLA. She has preferential gaze to the left and inferiorly and gaze is not past the midline to the right. Oropharynx is clear. Neck is nontender and supple without adenopathy or JVD. Back is nontender and there is no CVA tenderness. Lungs are clear without rales, wheezes, or rhonchi. Chest is nontender. Heart has regular rate and rhythm without murmur. Abdomen is soft, flat, nontender without masses or hepatosplenomegaly and peristalsis is normoactive. Extremities have no cyanosis or edema. Skin is warm and dry without rash. Neurologic: She is awake but does not follow commands. Generalized tremor is present. Cranial nerves are grossly intact except for limitation of EOM, there are no gross motor or sensory deficits.  ED Course  Procedures (including critical care time) Labs Review Results for orders placed during the hospital encounter of 06/27/13  COMPREHENSIVE METABOLIC PANEL      Result Value Range   Sodium 142  135 - 145 mEq/L   Potassium 3.9  3.5 - 5.1 mEq/L   Chloride 97  96 - 112 mEq/L   CO2 34 (*) 19 - 32 mEq/L   Glucose, Bld 197 (*) 70 - 99 mg/dL   BUN 33 (*) 6 - 23 mg/dL   Creatinine, Ser 0.98 (*) 0.50 - 1.10 mg/dL   Calcium 11.9  8.4 - 14.7 mg/dL   Total Protein 7.0  6.0 - 8.3 g/dL   Albumin 3.5  3.5 - 5.2 g/dL   AST 18  0 - 37 U/L   ALT 7  0 - 35 U/L   Alkaline Phosphatase 41  39 - 117 U/L   Total Bilirubin 0.8  0.3 - 1.2 mg/dL   GFR calc non Af Amer 27 (*) >90 mL/min   GFR calc Af Amer 32 (*) >90 mL/min  URINALYSIS, ROUTINE W REFLEX MICROSCOPIC      Result Value Range   Color, Urine YELLOW  YELLOW   APPearance CLEAR  CLEAR   Specific Gravity, Urine 1.014  1.005 - 1.030   pH 5.5  5.0 - 8.0   Glucose, UA 100 (*) NEGATIVE mg/dL   Hgb urine dipstick NEGATIVE  NEGATIVE   Bilirubin  Urine NEGATIVE  NEGATIVE   Ketones, ur NEGATIVE  NEGATIVE mg/dL   Protein, ur 30 (*) NEGATIVE mg/dL   Urobilinogen, UA 0.2  0.0 - 1.0 mg/dL   Nitrite NEGATIVE  NEGATIVE   Leukocytes, UA TRACE (*) NEGATIVE  GLUCOSE, CAPILLARY      Result Value Range   Glucose-Capillary 210 (*) 70 - 99 mg/dL  CBC WITH DIFFERENTIAL      Result Value Range   WBC 13.0 (*) 4.0 - 10.5 K/uL   RBC 4.72  3.87 - 5.11 MIL/uL   Hemoglobin 13.4  12.0 - 15.0 g/dL   HCT 82.9  56.2 - 13.0 %   MCV 85.0  78.0 - 100.0 fL   MCH 28.4  26.0 - 34.0 pg   MCHC 33.4  30.0 - 36.0 g/dL   RDW 86.5 (*) 78.4 - 69.6 %   Platelets 185  150 - 400 K/uL   Neutrophils Relative % 92 (*) 43 - 77 %   Lymphocytes Relative 6 (*) 12 - 46 %   Monocytes Relative 2 (*)  3 - 12 %   Eosinophils Relative 0  0 - 5 %   Basophils Relative 0  0 - 1 %   Neutro Abs 11.9 (*) 1.7 - 7.7 K/uL   Lymphs Abs 0.8  0.7 - 4.0 K/uL   Monocytes Absolute 0.3  0.1 - 1.0 K/uL   Eosinophils Absolute 0.0  0.0 - 0.7 K/uL   Basophils Absolute 0.0  0.0 - 0.1 K/uL   Smear Review MORPHOLOGY UNREMARKABLE    URINE MICROSCOPIC-ADD ON      Result Value Range   Squamous Epithelial / LPF RARE  RARE   WBC, UA 3-6  <3 WBC/hpf   Bacteria, UA RARE  RARE  CG4 I-STAT (LACTIC ACID)      Result Value Range   Lactic Acid, Venous 2.49 (*) 0.5 - 2.2 mmol/L   Imaging Review Ct Head Wo Contrast  06/27/2013   CLINICAL DATA:  Altered mental status. Diabetes. Emesis.  EXAM: CT HEAD WITHOUT CONTRAST  TECHNIQUE: Contiguous axial images were obtained from the base of the skull through the vertex without intravenous contrast.  COMPARISON:  05/21/2013  FINDINGS: The brainstem, cerebellum, cerebral peduncles, thalamus, basal ganglia, basilar cisterns, and ventricular system appear within normal limits. Periventricular white matter and corona radiata hypodensities favor chronic ischemic microvascular white matter disease. No intracranial hemorrhage, mass lesion, or acute CVA.  Atherosclerotic  calcification of the carotid siphons noted.  IMPRESSION: 1. No acute intracranial findings. 2. Chronic ischemic microvascular white matter disease.   Electronically Signed   By: Herbie Baltimore   On: 06/27/2013 17:38   Dg Chest Port 1 View  06/27/2013   *RADIOLOGY REPORT*  Clinical Data: Altered mental status.  Vomiting.  PORTABLE CHEST - 1 VIEW  Comparison: 06/01/2013  Findings: Chin overlies the apices. Cardiomegaly accentuated by AP portable technique.  No pleural fluid.  No definite pneumothorax.  Right infrahilar airspace disease.  Minimal left base volume loss.  IMPRESSION: Right infrahilar airspace disease, suspicious for infection or aspiration.  Cardiomegaly without congestive failure.   Original Report Authenticated By: Jeronimo Greaves, M.D.     Date: 06/27/2013  Rate: 98  Rhythm: normal sinus rhythm  QRS Axis: right  Intervals: normal  ST/T Wave abnormalities: nonspecific T wave changes  Conduction Disutrbances:none  Narrative Interpretation: Right axis deviation, right ventricular hypertrophy, nonspecific ST and T changes. When compared with ECG of 06/02/2012, right axis deviation is now present.  Old EKG Reviewed: changes noted   MDM  No diagnosis found. Altered mental status in patient with history of multiple UTIs. It is likely that she has another UTI. Review of old records shows that she has had multiple UTIs with enterococcus with the same sensitivity patterns including sensitivity to ampicillin, vancomycin, and nitrofurantoin. She will also be screened with chest x-ray and CT of the head.   WBC has come back mildly elevated at 13,000 with a left shift of 92% neutrophils. Lactic acid is minimally elevated at 2.49 and probably not clinically significant. BUN and creatinine are not significantly changed from her baseline. Chest x-ray and CT of the head are unremarkable. Urinalysis is still pending. Case has been discussed with Dr. Felipa Eth of [facility] who agrees to come  and admit the patient. Antibiotics will be given once urine sample has been obtained  We'll need to consider keeping her on a prolonged course of antibiotics given the fact that she has had recurrent infections with the same pathogen indicating that the original infection has not been adequately  treated.  Dione Booze, MD 06/27/13 2016

## 2013-06-27 NOTE — ED Notes (Signed)
Dr. Preston Fleeting notified of rectal temperature.

## 2013-06-27 NOTE — ED Notes (Signed)
Per Dr. Preston Fleeting, no code sepsis will be called.

## 2013-06-27 NOTE — Progress Notes (Signed)
ANTIBIOTIC CONSULT NOTE - INITIAL  Pharmacy Consult for Vancomycin Indication: UTI / pneumonia  Allergies  Allergen Reactions  . Adhesive [Tape]     Skin irritation, "pulls the skin off"   . Aspirin     Crawling feeling under skin  . Hydrocodone Other (See Comments)    Hallucinations   . Iohexol      Desc: pt unable to tell what the reaction was   . Oxycodone     hallucinations  . Penicillins Hives and Swelling    Has tolerated cefazolin and ceftriaxone.  . Tacrolimus Other (See Comments)    Hallucinations   . Contrast Media [Iodinated Diagnostic Agents] Hives, Swelling and Rash    Patient Measurements: Height: 5' 4.8" (164.6 cm) Weight: 126 lb 3.2 oz (57.244 kg) IBW/kg (Calculated) : 56.54   Vital Signs: Temp: 99.7 F (37.6 C) (09/07 2041) Temp src: Axillary (09/07 2041) BP: 117/53 mmHg (09/07 2041) Pulse Rate: 82 (09/07 2041) Intake/Output from previous day:   Intake/Output from this shift:    Labs:  Recent Labs  06/27/13 1540 06/27/13 1552  WBC  --  13.0*  HGB  --  13.4  PLT  --  185  CREATININE 1.76*  --    Estimated Creatinine Clearance: 25 ml/min (by C-G formula based on Cr of 1.76). No results found for this basename: VANCOTROUGH, Leodis Binet, VANCORANDOM, GENTTROUGH, GENTPEAK, GENTRANDOM, TOBRATROUGH, TOBRAPEAK, TOBRARND, AMIKACINPEAK, AMIKACINTROU, AMIKACIN,  in the last 72 hours   Microbiology: Recent Results (from the past 720 hour(s))  URINE CULTURE     Status: None   Collection Time    06/01/13  8:08 PM      Result Value Range Status   Specimen Description URINE, RANDOM   Final   Special Requests chills fever   Final   Culture  Setup Time     Final   Value: 06/01/2013 22:13     Performed at [facility] Count     Final   Value: 30,000 COLONIES/ML     Performed at [facility]   Culture     Final   Value: Multiple bacterial morphotypes present, none predominant. Suggest appropriate recollection if clinically  indicated.     Performed at [facility]   Report Status 06/03/2013 FINAL   Final    Medical History: Past Medical History  Diagnosis Date  . Diabetes mellitus   . Diabetic neuropathy   . Hypothyroidism   . Hypertension   . GERD (gastroesophageal reflux disease)   . Heart murmur     not treated for  . Arthritis   . Chronic kidney disease     RENAL TRANSPLANT IN 2008--PT IS FOLLOWED BY DR. Karie Fetch WITH BUN OF 37 AND CREAT 1.7--PER NOTE DR. NESI FROM 04/11/12  . Urethral diverticulum   . Eyesight diminished     GLAUCOMA   . History of shingles     LEFT EYE JAN 2012--STILL HAS EYE PAIN-STATES NOT ABLE TO TAKE THE MEDICATION FOR SHINGLES BECAUSE OF HER HX OF KIDNEY TRANSPLANT  . Fingernail abnormalities     FUNGUS OF FINGERNAILS  . UTI (urinary tract infection) 05/18/2013     Assessment: 72yof with Hx renal transplant 2008,  CKD III followed by nephrology as outpatient.  She was admitted with MS changes, elevated WBC 13 and recent UTI.  Cxr shows R opacity possible pna.  Ceftriaxone started for pna and vancomycin to start for recent enterococcus UTI.    Goal of Therapy:  Vancomycin trough level 15-20 mcg/ml  Plan:  Vancomycin 1gm x1 Follow up renal function for need for redose  Leota Sauers Pharm.D. CPP, BCPS Clinical Pharmacist [phone] 06/27/2013 9:03 PM

## 2013-06-27 NOTE — ED Notes (Signed)
Husband refused for RN to ask pt question for neuro assessment. Charge nurse notified.

## 2013-06-27 NOTE — ED Notes (Signed)
Pt is here with ams:  Not being able to finish sentences for the last week.  Pt shivering and caregiver reports blood sugar did drop this am.  Pt has been recently treated for UTI.

## 2013-06-28 ENCOUNTER — Observation Stay (HOSPITAL_COMMUNITY): Payer: Medicare Other

## 2013-06-28 ENCOUNTER — Inpatient Hospital Stay (HOSPITAL_COMMUNITY): Payer: Medicare Other

## 2013-06-28 DIAGNOSIS — R4182 Altered mental status, unspecified: Secondary | ICD-10-CM

## 2013-06-28 DIAGNOSIS — R509 Fever, unspecified: Secondary | ICD-10-CM

## 2013-06-28 LAB — BASIC METABOLIC PANEL
BUN: 34 mg/dL — ABNORMAL HIGH (ref 6–23)
Chloride: 99 mEq/L (ref 96–112)
GFR calc Af Amer: 32 mL/min — ABNORMAL LOW (ref >90)
GFR calc non Af Amer: 28 mL/min — ABNORMAL LOW (ref >90)
Potassium: 3.3 mEq/L — ABNORMAL LOW (ref 3.5–5.1)
Sodium: 142 mEq/L (ref 135–145)

## 2013-06-28 LAB — GLUCOSE, CAPILLARY
Glucose-Capillary: 51 mg/dL — ABNORMAL LOW (ref 70–99)
Glucose-Capillary: 70 mg/dL (ref 70–99)
Glucose-Capillary: 113 mg/dL — ABNORMAL HIGH (ref 70–99)

## 2013-06-28 LAB — CBC
HCT: 31.5 % — ABNORMAL LOW (ref 36.0–46.0)
Hemoglobin: 10 g/dL — ABNORMAL LOW (ref 12.0–15.0)
RDW: 17.1 % — ABNORMAL HIGH (ref 11.5–15.5)
WBC: 10.5 10*3/uL (ref 4.0–10.5)

## 2013-06-28 LAB — HEMOGLOBIN A1C
Hgb A1c MFr Bld: 7.7 % — ABNORMAL HIGH (ref <5.7)
Mean Plasma Glucose: 174 mg/dL — ABNORMAL HIGH (ref <117)

## 2013-06-28 MED ORDER — INSULIN DETEMIR 100 UNIT/ML ~~LOC~~ SOLN
10.0000 [IU] | Freq: Every day | SUBCUTANEOUS | Status: DC
  Administered 2013-06-28 – 2013-06-29 (×2): 10 [IU] via SUBCUTANEOUS
  Filled 2013-06-28 (×3): qty 0.1

## 2013-06-28 MED ORDER — DEXTROSE-NACL 5-0.9 % IV SOLN: INTRAVENOUS | Status: DC

## 2013-06-28 MED ORDER — GABAPENTIN 300 MG PO CAPS
300.0000 mg | ORAL_CAPSULE | Freq: Every day | ORAL | Status: DC
  Administered 2013-06-29 – 2013-07-04 (×5): 300 mg via ORAL
  Filled 2013-06-28 (×7): qty 1

## 2013-06-28 MED ORDER — DEXTROSE-NACL 5-0.9 % IV SOLN
INTRAVENOUS | Status: AC
  Administered 2013-06-28: 15:00:00 via INTRAVENOUS

## 2013-06-28 MED ORDER — NEPRO/CARBSTEADY PO LIQD
237.0000 mL | Freq: Two times a day (BID) | ORAL | Status: DC
  Administered 2013-06-28 – 2013-07-05 (×8): 237 mL via ORAL

## 2013-06-28 MED ORDER — POTASSIUM CHLORIDE CRYS ER 20 MEQ PO TBCR
20.0000 meq | EXTENDED_RELEASE_TABLET | Freq: Every day | ORAL | Status: DC
  Administered 2013-06-28 – 2013-07-02 (×5): 20 meq via ORAL
  Filled 2013-06-28 (×7): qty 1

## 2013-06-28 MED ORDER — DEXTROSE-NACL 5-0.9 % IV SOLN
INTRAVENOUS | Status: DC
  Administered 2013-06-29 (×2): via INTRAVENOUS
  Administered 2013-06-30: 50 mL via INTRAVENOUS

## 2013-06-28 MED ORDER — LABETALOL HCL 300 MG PO TABS
300.0000 mg | ORAL_TABLET | Freq: Two times a day (BID) | ORAL | Status: DC
  Administered 2013-06-28 – 2013-07-02 (×8): 300 mg via ORAL
  Filled 2013-06-28 (×10): qty 1

## 2013-06-28 MED ORDER — VANCOMYCIN HCL 500 MG IV SOLR
500.0000 mg | INTRAVENOUS | Status: DC
  Administered 2013-06-28 – 2013-07-01 (×4): 500 mg via INTRAVENOUS
  Filled 2013-06-28 (×5): qty 500

## 2013-06-28 NOTE — Progress Notes (Signed)
Hypoglycemic Event  CBG: 51  Treatment:  Carb snack  Symptoms: none  Follow-up CBG: Time: 4:44 CBG Result:*43  Possible Reasons for Event:  Poor PO intake  Comments/MD notified:    Steele Berg  Remember to initiate Hypoglycemia Order Set & complete

## 2013-06-28 NOTE — Progress Notes (Signed)
ANTIBIOTIC CONSULT NOTE - FOLLOW UP  Pharmacy Consult for Vancomycin Indication: UTI / pneumonia  Allergies  Allergen Reactions  . Adhesive [Tape]     Skin irritation, "pulls the skin off"   . Aspirin     Crawling feeling under skin  . Hydrocodone Other (See Comments)    Hallucinations   . Iohexol      Desc: pt unable to tell what the reaction was   . Oxycodone     hallucinations  . Penicillins Hives and Swelling    Has tolerated cefazolin and ceftriaxone.  . Tacrolimus Other (See Comments)    Hallucinations   . Contrast Media [Iodinated Diagnostic Agents] Hives, Swelling and Rash    Patient Measurements: Height: 5' 4.8" (164.6 cm) Weight: 126 lb 3.2 oz (57.244 kg) IBW/kg (Calculated) : 56.54  Vital Signs: Temp: 99.1 F (37.3 C) (09/08 1432) Temp src: Oral (09/08 1432) BP: 134/109 mmHg (09/08 2122) Pulse Rate: 115 (09/08 2122) Intake/Output from previous day: 09/07 0701 - 09/08 0700 In: 368.3 [I.V.:368.3] Out: 350 [Urine:350] Intake/Output from this shift:    Labs:  Recent Labs  06/27/13 1540 06/27/13 1552 06/28/13 0422  WBC  --  13.0* 10.5  HGB  --  13.4 10.0*  PLT  --  185 176  CREATININE 1.76*  --  1.75*   Estimated Creatinine Clearance: 25.2 ml/min (by C-G formula based on Cr of 1.75).  Recent Labs  06/28/13 2107  VANCOTROUGH 17.9     Microbiology: Recent Results (from the past 720 hour(s))  URINE CULTURE     Status: None   Collection Time    06/01/13  8:08 PM      Result Value Range Status   Specimen Description URINE, RANDOM   Final   Special Requests chills fever   Final   Culture  Setup Time     Final   Value: 06/01/2013 22:13     Performed at [facility] Count     Final   Value: 30,000 COLONIES/ML     Performed at [facility]   Culture     Final   Value: Multiple bacterial morphotypes present, none predominant. Suggest appropriate recollection if clinically indicated.     Performed at [facility]   Report Status 06/03/2013 FINAL   Final    Anti-infectives   Start     Dose/Rate Route Frequency Ordered Stop   06/27/13 2200  vancomycin (VANCOCIN) IVPB 1000 mg/200 mL premix     1,000 mg 200 mL/hr over 60 Minutes Intravenous  Once 06/27/13 2056 06/27/13 2307   06/27/13 2100  cefTRIAXone (ROCEPHIN) 1 g in dextrose 5 % 50 mL IVPB     1 g 100 mL/hr over 30 Minutes Intravenous Every 24 hours 06/27/13 2045     06/27/13 1845  vancomycin (VANCOCIN) IVPB 1000 mg/200 mL premix     1,000 mg 200 mL/hr over 60 Minutes Intravenous  Once 06/27/13 1834 06/27/13 2032      Assessment: 72 yo F on Vancomycin for recent enterococcus UTI.  It appears from the [facility] documentation that the patient received a total 2gm Vancomycin dose 9/7 pm.  However, based on the level of 17.9, the patient probably only received a 1gm dose and it was charted by two different nurses (one from the ED and one from the floor).  Will start patient on a maintenance regimen of Vancomycin at this time.  Goal of Therapy:  Vancomycin trough level 15-20 mcg/ml  Plan:  Vancomycin 500 mg IV q24h. Follow-up culture data, renal function, and clinical progress. Vancomycin trough as indicated.  [employer], Pharm.D., BCPS Clinical Pharmacist Pager [phone] 06/28/2013 10:37 PM

## 2013-06-28 NOTE — Procedures (Signed)
History: 72 year old with altered mental status  Sedation: None  Background: The background is moderately disorganized, though a posterior dominant rhythm with progressive 8 Hz is seen and is reactive to eye opening and closure. There is generalized regular delta activity. There are moderately frequent triphasic waves with a bifrontal predominance.  Photic stimulation: Physiologic driving is not performed  EEG Abnormalities: 1) triphasic waves 2) generalized irregular delta activity  Clinical Interpretation: This abnormal EEG is most consistent with a mild to moderate generalized non-specific cerebral dysfunction(encephalopathy). Though nonspecific, the presence of triphasic waves can be suggestive of a toxic/metabolic etiology. There was no seizure or seizure predisposition recorded on this study.   Ritta Slot, MD [facility] [phone]  If 7pm- 7am, please page neurology on call at [phone].

## 2013-06-28 NOTE — Progress Notes (Signed)
Hypoglycemic Event  CBG: 43  Treatment:  Carb snack  Symptoms: none  Follow-up CBG: Time: 70 CBG Result: 5:14  Possible Reasons for Event:  Poor PO intake  Comments/MD notified:    Steele Berg  Remember to initiate Hypoglycemia Order Set & complete

## 2013-06-28 NOTE — Progress Notes (Signed)
Patient arrived on unit from ED, husband at bedside.  Patient arousable but will not respond verbally.

## 2013-06-28 NOTE — Progress Notes (Signed)
Unable to complete admission history d/t patient alter mental status.

## 2013-06-28 NOTE — Consult Note (Signed)
NEURO HOSPITALIST CONSULT NOTE    Reason for Consult: AMS  HPI:                                                                                                                                          [name] is an 72 y.o. female with chronic kidney disease status post renal transplant in 2008,on temporary dialysis, currently on immunosuppressive therapy and multiple hospitalizations for recurrent UTI associated with altered mental status. At baseline she does live at home with her husband, he does all the caring for patient including, (cooking, bathing, help to ambulate) walks with a walker, but can feed herself.  She does have some baseline dementia and was scheduled to be evaluated by neurology in the upcoming week. Recently she has been treated by nephrology with Macrobid per for UTI  7-10 days ago.  Over the past week-prior to admission, he mental status worsened, had decreased ambulation and was not eating regularly. Per chart she had a fever up to 103 with most recent temperatures of 99.6.  While hospitalized she has had multiple episodes of hypoglycemia with CBG 44, 51 and 68. Initial WBC 13.0. UA showing no leukocytes or nitrite and blood cultures pending. CXR showing patchy right sided pulmonary infiltrates and possible component of left upper lobe pneumonia. Neurology was asked to see patient for AMS.   Currently on Ceftriaxone  Past Medical History  Diagnosis Date  . Diabetes mellitus   . Diabetic neuropathy   . Hypothyroidism   . Hypertension   . GERD (gastroesophageal reflux disease)   . Heart murmur     not treated for  . Arthritis   . Chronic kidney disease     RENAL TRANSPLANT IN 2008--PT IS FOLLOWED BY DR. Karie Fetch WITH BUN OF 37 AND CREAT 1.7--PER NOTE DR. NESI FROM 04/11/12  . Urethral diverticulum   . Eyesight diminished     GLAUCOMA   . History of shingles     LEFT EYE JAN 2012--STILL HAS EYE PAIN-STATES NOT ABLE TO TAKE THE MEDICATION  FOR SHINGLES BECAUSE OF HER HX OF KIDNEY TRANSPLANT  . Fingernail abnormalities     FUNGUS OF FINGERNAILS  . UTI (urinary tract infection) 05/18/2013    Past Surgical History  Procedure Laterality Date  . Kidney transplant  2008    At [facility]  . Back surgery      ruptured disk  . Abdominal hysterectomy    . Appendectomy    . Laser surgery of both eyes for hemorrhages    . 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 Urethral diverticulectomy  04/28/2012    Procedure: URETHRAL DIVERTICULECTOMY;  Surgeon: Antony Haste, MD;  Location: [facility];  Service: Urology;  Laterality: N/A;    Family  History  Problem Relation Age of Onset  . Anesthesia problems Neg Hx      Social History:  reports that she quit smoking about 34 years ago. She has never used smokeless tobacco. She reports that she does not drink alcohol or use illicit drugs.  Allergies  Allergen Reactions  . Adhesive [Tape]     Skin irritation, "pulls the skin off"   . Aspirin     Crawling feeling under skin  . Hydrocodone Other (See Comments)    Hallucinations   . Iohexol      Desc: pt unable to tell what the reaction was   . Oxycodone     hallucinations  . Penicillins Hives and Swelling    Has tolerated cefazolin and ceftriaxone.  . Tacrolimus Other (See Comments)    Hallucinations   . Contrast Media [Iodinated Diagnostic Agents] Hives, Swelling and Rash    MEDICATIONS:                                                                                                                     Prior to Admission:  Prescriptions prior to admission  Medication Sig Dispense Refill  . amLODipine (NORVASC) 10 MG tablet Take 1 tablet (10 mg total) by mouth daily with breakfast.  30 tablet  0  . calcitRIOL (ROCALTROL) 0.5 MCG capsule Take 1 capsule (0.5 mcg total) by mouth daily with breakfast.  30 capsule  0  . fentaNYL (DURAGESIC - DOSED MCG/HR) 50 MCG/HR Place 1 patch onto the skin every 3 (three) days.      .  furosemide (LASIX) 40 MG tablet Take 80 mg by mouth 2 (two) times daily.      Marland Kitchen gabapentin (NEURONTIN) 300 MG capsule Take 300 mg by mouth 2 (two) times daily.      . insulin detemir (LEVEMIR) 100 UNIT/ML injection Inject 28 Units into the skin daily with breakfast.      . insulin lispro (HUMALOG) 100 UNIT/ML injection Inject 2-4 Units into the skin 3 (three) times daily before meals.      Marland Kitchen ketoconazole (NIZORAL) 2 % cream Apply 1 application topically 2 (two) times daily.      Marland Kitchen labetalol (NORMODYNE) 200 MG tablet Take 1.5 tablets (300 mg total) by mouth 3 (three) times daily. Takes 1 and 1/2 tabs  120 tablet  0  . latanoprost (XALATAN) 0.005 % ophthalmic solution Place 1 drop into both eyes at bedtime.      Marland Kitchen levothyroxine (SYNTHROID, LEVOTHROID) 112 MCG tablet Take 2 tablets (224 mcg total) by mouth daily with breakfast.  60 tablet  0  . mycophenolate (CELLCEPT) 250 MG capsule Take 2 capsules (500 mg total) by mouth 2 (two) times daily.  120 capsule  0  . potassium chloride SA (K-DUR,KLOR-CON) 20 MEQ tablet Take 1 tablet (20 mEq total) by mouth daily.  14 tablet  0  . predniSONE (DELTASONE) 5 MG tablet Take 2 tablets (10 mg total) by mouth daily with breakfast.  60 tablet  0  . ranitidine (ZANTAC) 150 MG tablet Take 1 tablet (150 mg total) by mouth daily.  30 tablet  0  . sirolimus (RAPAMUNE) 1 MG tablet Take 3 tablets (3 mg total) by mouth daily. Pt takes three 1 mg tablets = 3 mg daily  90 tablet  0  . urea (CARMOL) 40 % CREA Apply 1 application topically 2 (two) times daily. Apply to finger nails, and cuticles.       Scheduled: . amLODipine  10 mg Oral Q breakfast  . calcitRIOL  0.5 mcg Oral Q breakfast  . cefTRIAXone (ROCEPHIN)  IV  1 g Intravenous Q24H  . enoxaparin (LOVENOX) injection  30 mg Subcutaneous Q24H  . famotidine  20 mg Oral Daily  . fentaNYL  50 mcg Transdermal Q3 days  . gabapentin  300 mg Oral BID  . insulin aspart  0-9 Units Subcutaneous TID WC  . insulin detemir  10  Units Subcutaneous Daily  . ketoconazole  1 application Topical BID  . labetalol  300 mg Oral BID  . latanoprost  1 drop Both Eyes QHS  . levothyroxine  224 mcg Oral QAC breakfast  . mycophenolate  500 mg Oral BID  . potassium chloride  20 mEq Oral Daily  . predniSONE  10 mg Oral Q breakfast  . sirolimus  3 mg Oral Daily     ROS:                                                                                                                                       History obtained from unobtainable from patient due to mental status   Blood pressure 121/73, pulse 76, temperature 99.6 F (37.6 C), temperature source Axillary, resp. rate 20, height 5' 4.8" (1.646 m), weight 57.244 kg (126 lb 3.2 oz), SpO2 97.00%.   Neurologic Examination:                                                                                                      Mental Status: Alert, not oriented to place, date or year, minimally talkative.  Able to follow simple commands with difficulty due to decreased attention.   Cranial Nerves: II:  Blinks to threat in right eye, otherwise severely restricted peripheral vision bilaterally, she does fixate and track.  pupils equal, round, reactive to light and accommodation. Discs difficult to visualize.  III,IV, VI: ptosis present on the left, extra-ocular motions intact bilaterally V,VII: Smile asymmetric on the left, facial light touch sensation  normal bilaterally VIII: hearing decreased bilaterally IX,X: gag reflex present XI: bilateral shoulder shrug XII: midline tongue extension Motor: Moves all extremities antigravity, left ankle is held inverted and she shows inability to evert her left ankle (she has had back surgery in past and this is old) Tone and bulk:normal tone throughout; no atrophy noted Sensory: Pinprick and light touch intact throughout, bilaterally Deep Tendon Reflexes:  Right: Upper Extremity   Left: Upper extremity   biceps (C-5 to C-6) 2/4   biceps  (C-5 to C-6) 2/4 tricep (C7) 2/4    triceps (C7) 2/4 Brachioradialis (C6) 2/4  Brachioradialis (C6) 2/4  Lower Extremity Lower Extremity  quadriceps (L-2 to L-4) 1/4   quadriceps (L-2 to L-4) 1/4 Achilles (S1) 0/4   Achilles (S1) 0/4  Plantars: Right: [place]   Left: downgoing Cerebellar: normal finger-to-nose CV: pulses palpable throughout    Lab Results  Component Value Date/Time   CHOL  Value: 187        ATP III CLASSIFICATION:  <200     mg/dL   Desirable  161-096  mg/dL   Borderline High  >=045    mg/dL   High        4/0/9811  8:20 AM    Results for orders placed during the hospital encounter of 06/27/13 (from the past 48 hour(s))  COMPREHENSIVE METABOLIC PANEL     Status: Abnormal   Collection Time    06/27/13  3:40 PM      Result Value Range   Sodium 142  135 - 145 mEq/L   Potassium 3.9  3.5 - 5.1 mEq/L   Chloride 97  96 - 112 mEq/L   CO2 34 (*) 19 - 32 mEq/L   Glucose, Bld 197 (*) 70 - 99 mg/dL   BUN 33 (*) 6 - 23 mg/dL   Creatinine, Ser 9.14 (*) 0.50 - 1.10 mg/dL   Calcium 78.2  8.4 - 95.6 mg/dL   Total Protein 7.0  6.0 - 8.3 g/dL   Albumin 3.5  3.5 - 5.2 g/dL   AST 18  0 - 37 U/L   ALT 7  0 - 35 U/L   Alkaline Phosphatase 41  39 - 117 U/L   Total Bilirubin 0.8  0.3 - 1.2 mg/dL   GFR calc non Af Amer 27 (*) >90 mL/min   GFR calc Af Amer 32 (*) >90 mL/min   Comment: (NOTE)     The eGFR has been calculated using the CKD EPI equation.     This calculation has not been validated in all clinical situations.     eGFR's persistently <90 mL/min signify possible Chronic Kidney     Disease.  GLUCOSE, CAPILLARY     Status: Abnormal   Collection Time    06/27/13  3:40 PM      Result Value Range   Glucose-Capillary 210 (*) 70 - 99 mg/dL  CBC WITH DIFFERENTIAL     Status: Abnormal   Collection Time    06/27/13  3:52 PM      Result Value Range   WBC 13.0 (*) 4.0 - 10.5 K/uL   RBC 4.72  3.87 - 5.11 MIL/uL   Hemoglobin 13.4  12.0 - 15.0 g/dL   HCT 21.3  08.6 - 57.8 %    MCV 85.0  78.0 - 100.0 fL   MCH 28.4  26.0 - 34.0 pg   MCHC 33.4  30.0 - 36.0 g/dL   RDW 46.9 (*) 62.9 - 52.8 %  Platelets 185  150 - 400 K/uL   Neutrophils Relative % 92 (*) 43 - 77 %   Lymphocytes Relative 6 (*) 12 - 46 %   Monocytes Relative 2 (*) 3 - 12 %   Eosinophils Relative 0  0 - 5 %   Basophils Relative 0  0 - 1 %   Neutro Abs 11.9 (*) 1.7 - 7.7 K/uL   Lymphs Abs 0.8  0.7 - 4.0 K/uL   Monocytes Absolute 0.3  0.1 - 1.0 K/uL   Eosinophils Absolute 0.0  0.0 - 0.7 K/uL   Basophils Absolute 0.0  0.0 - 0.1 K/uL   Smear Review MORPHOLOGY UNREMARKABLE    CG4 I-STAT (LACTIC ACID)     Status: Abnormal   Collection Time    06/27/13  4:52 PM      Result Value Range   Lactic Acid, Venous 2.49 (*) 0.5 - 2.2 mmol/L  URINALYSIS, ROUTINE W REFLEX MICROSCOPIC     Status: Abnormal   Collection Time    06/27/13  6:34 PM      Result Value Range   Color, Urine YELLOW  YELLOW   APPearance CLEAR  CLEAR   Specific Gravity, Urine 1.014  1.005 - 1.030   pH 5.5  5.0 - 8.0   Glucose, UA 100 (*) NEGATIVE mg/dL   Hgb urine dipstick NEGATIVE  NEGATIVE   Bilirubin Urine NEGATIVE  NEGATIVE   Ketones, ur NEGATIVE  NEGATIVE mg/dL   Protein, ur 30 (*) NEGATIVE mg/dL   Urobilinogen, UA 0.2  0.0 - 1.0 mg/dL   Nitrite NEGATIVE  NEGATIVE   Leukocytes, UA TRACE (*) NEGATIVE  URINE MICROSCOPIC-ADD ON     Status: None   Collection Time    06/27/13  6:34 PM      Result Value Range   Squamous Epithelial / LPF RARE  RARE   WBC, UA 3-6  <3 WBC/hpf   Bacteria, UA RARE  RARE  GLUCOSE, CAPILLARY     Status: Abnormal   Collection Time    06/27/13  9:11 PM      Result Value Range   Glucose-Capillary 123 (*) 70 - 99 mg/dL   Comment 1 Notify RN    CBC     Status: Abnormal   Collection Time    06/28/13  4:22 AM      Result Value Range   WBC 10.5  4.0 - 10.5 K/uL   RBC 3.68 (*) 3.87 - 5.11 MIL/uL   Hemoglobin 10.0 (*) 12.0 - 15.0 g/dL   Comment: DELTA CHECK NOTED     REPEATED TO VERIFY   HCT 31.5 (*)  36.0 - 46.0 %   MCV 85.6  78.0 - 100.0 fL   MCH 27.2  26.0 - 34.0 pg   MCHC 31.7  30.0 - 36.0 g/dL   RDW 56.2 (*) 13.0 - 86.5 %   Platelets 176  150 - 400 K/uL  BASIC METABOLIC PANEL     Status: Abnormal   Collection Time    06/28/13  4:22 AM      Result Value Range   Sodium 142  135 - 145 mEq/L   Potassium 3.3 (*) 3.5 - 5.1 mEq/L   Chloride 99  96 - 112 mEq/L   CO2 34 (*) 19 - 32 mEq/L   Glucose, Bld 50 (*) 70 - 99 mg/dL   BUN 34 (*) 6 - 23 mg/dL   Creatinine, Ser 7.84 (*) 0.50 - 1.10 mg/dL   Calcium 8.9  8.4 - 10.5 mg/dL   GFR calc non Af Amer 28 (*) >90 mL/min   GFR calc Af Amer 32 (*) >90 mL/min   Comment: (NOTE)     The eGFR has been calculated using the CKD EPI equation.     This calculation has not been validated in all clinical situations.     eGFR's persistently <90 mL/min signify possible Chronic Kidney     Disease.  GLUCOSE, CAPILLARY     Status: Abnormal   Collection Time    06/28/13  4:23 AM      Result Value Range   Glucose-Capillary 51 (*) 70 - 99 mg/dL   Comment 1 Notify RN    GLUCOSE, CAPILLARY     Status: Abnormal   Collection Time    06/28/13  4:44 AM      Result Value Range   Glucose-Capillary 43 (*) 70 - 99 mg/dL   Comment 1 Notify RN    GLUCOSE, CAPILLARY     Status: None   Collection Time    06/28/13  5:14 AM      Result Value Range   Glucose-Capillary 70  70 - 99 mg/dL  GLUCOSE, CAPILLARY     Status: Abnormal   Collection Time    06/28/13  8:53 AM      Result Value Range   Glucose-Capillary 68 (*) 70 - 99 mg/dL  GLUCOSE, CAPILLARY     Status: None   Collection Time    06/28/13  9:43 AM      Result Value Range   Glucose-Capillary 98  70 - 99 mg/dL    X-ray Chest Pa And Lateral   06/28/2013   *RADIOLOGY REPORT*  Clinical Data: Pneumonia.  CHEST - 2 VIEW  Comparison: 06/27/2013  Findings: Patchy infiltrates are present in the right upper and lower lung zones.  There may also be subtle infiltrate in the left upper lung.  No evidence of overt  edema or pleural fluid.  There is stable cardiomegaly.  IMPRESSION: Patchy right sided pulmonary infiltrates and possible component of left upper lobe pneumonia as well.   Original Report Authenticated By: Irish Lack, M.D.   Ct Head Wo Contrast  06/27/2013   CLINICAL DATA:  Altered mental status. Diabetes. Emesis.  EXAM: CT HEAD WITHOUT CONTRAST  TECHNIQUE: Contiguous axial images were obtained from the base of the skull through the vertex without intravenous contrast.  COMPARISON:  05/21/2013  FINDINGS: The brainstem, cerebellum, cerebral peduncles, thalamus, basal ganglia, basilar cisterns, and ventricular system appear within normal limits. Periventricular white matter and corona radiata hypodensities favor chronic ischemic microvascular white matter disease. No intracranial hemorrhage, mass lesion, or acute CVA.  Atherosclerotic calcification of the carotid siphons noted.  IMPRESSION: 1. No acute intracranial findings. 2. Chronic ischemic microvascular white matter disease.   Electronically Signed   By: Herbie Baltimore   On: 06/27/2013 17:38   Dg Chest Port 1 View  06/27/2013   *RADIOLOGY REPORT*  Clinical Data: Altered mental status.  Vomiting.  PORTABLE CHEST - 1 VIEW  Comparison: 06/01/2013  Findings: Chin overlies the apices. Cardiomegaly accentuated by AP portable technique.  No pleural fluid.  No definite pneumothorax.  Right infrahilar airspace disease.  Minimal left base volume loss.  IMPRESSION: Right infrahilar airspace disease, suspicious for infection or aspiration.  Cardiomegaly without congestive failure.   Original Report Authenticated By: Jeronimo Greaves, M.D.     Assessment and plan discussed with with attending physician and they are in agreement.  Felicie Morn PA-C [facility] [phone]  06/28/2013, 10:51 AM    Assessment/Plan:  72 year old female with recurrent episodes of altered mental status in the setting of infection. After discussing with her husband, it  sounds like she has had a progressive decline and he has taken over duties previously performed by her including household counting, cooking. He denies signs of dementia, but I suspect some element of denial. I suspect that this does represent delirium in the setting of dementia and physiological stressor.   An EEG may be helpful, but I do not think further extensive workup only necessary given the similarity to previous spells associated with infections and the presence of pneumonia on chest x-ray.  1) EEG 2) would likely benefit from outpatient neuro evaluation for dementia 3) if EEG does not show signs of seizure activity, then neurology will sign off, however if she does not improve as expected with treatment of her infection or other questions continue please do not hesitate to call.  Ritta Slot, MD [facility] [phone]  If 7pm- 7am, please page neurology on call at [phone].

## 2013-06-28 NOTE — Progress Notes (Signed)
Subjective: Low sugar to 44 this AM. Was low to 30 at home yesterday AM. Not eating well. No chills or sweats. Denies any SOB but CXR now shows bilat upper airway dz. Had brief vomiting at home ? aspiration. No pain. Slow mentation continues   Objective: Vital signs in last 24 hours: Temp:  [99.6 F (37.6 C)-103 F (39.4 C)] 99.6 F (37.6 C) (09/08 0611) Pulse Rate:  [75-100] 75 (09/08 0611) Resp:  [15-24] 20 (09/08 0611) BP: (107-149)/(35-93) 115/78 mmHg (09/08 0812) SpO2:  [90 %-100 %] 97 % (09/08 0611) Weight:  [57.244 kg (126 lb 3.2 oz)] 57.244 kg (126 lb 3.2 oz) (09/07 2041)  Intake/Output from previous day: 09/07 0701 - 09/08 0700 In: 368.3 [I.V.:368.3] Out: 350 [Urine:350] Intake/Output this shift:    sittng up in no distess. Minor periorbital edema. Oral membranes moist. A few right rhonchi, no accessory ms in use. Ht regular with harsh aortic murmur. abd soft NT good BS's. No edema. Awake. Slow mentation. Speech fairly clear. Some left hand tremor  Lab Results   Recent Labs  06/27/13 1552 06/28/13 0422  WBC 13.0* 10.5  RBC 4.72 3.68*  HGB 13.4 10.0*  HCT 40.1 31.5*  MCV 85.0 85.6  MCH 28.4 27.2  RDW 16.7* 17.1*  PLT 185 176    Recent Labs  06/27/13 1540 06/28/13 0422  NA 142 142  K 3.9 3.3*  CL 97 99  CO2 34* 34*  GLUCOSE 197* 50*  BUN 33* 34*  CREATININE 1.76* 1.75*  CALCIUM 10.0 8.9   Results for [name], [name] (MRN [id number]) as of 06/28/2013 08:38  Ref. Range 06/27/2013 15:40 06/27/2013 15:52 06/27/2013 16:52  Alkaline Phosphatase Latest Range: 39-117 U/L 41    Albumin Latest Range: 3.5-5.2 g/dL 3.5    AST Latest Range: 0-37 U/L 18    ALT Latest Range: 0-35 U/L 7    Total Protein Latest Range: 6.0-8.3 g/dL 7.0    Total Bilirubin Latest Range: 0.3-1.2 mg/dL 0.8    Lactic Acid, Venous Latest Range: 0.5-2.2 mmol/L   2.49 (H)   Studies/Results: X-ray Chest Pa And Lateral   06/28/2013   *RADIOLOGY REPORT*  Clinical Data: Pneumonia.  CHEST - 2 VIEW   Comparison: 06/27/2013  Findings: Patchy infiltrates are present in the right upper and lower lung zones.  There may also be subtle infiltrate in the left upper lung.  No evidence of overt edema or pleural fluid.  There is stable cardiomegaly.  IMPRESSION: Patchy right sided pulmonary infiltrates and possible component of left upper lobe pneumonia as well.   Original Report Authenticated By: Irish Lack, M.D.   Ct Head Wo Contrast  06/27/2013   CLINICAL DATA:  Altered mental status. Diabetes. Emesis.  EXAM: CT HEAD WITHOUT CONTRAST  TECHNIQUE: Contiguous axial images were obtained from the base of the skull through the vertex without intravenous contrast.  COMPARISON:  05/21/2013  FINDINGS: The brainstem, cerebellum, cerebral peduncles, thalamus, basal ganglia, basilar cisterns, and ventricular system appear within normal limits. Periventricular white matter and corona radiata hypodensities favor chronic ischemic microvascular white matter disease. No intracranial hemorrhage, mass lesion, or acute CVA.  Atherosclerotic calcification of the carotid siphons noted.  IMPRESSION: 1. No acute intracranial findings. 2. Chronic ischemic microvascular white matter disease.   Electronically Signed   By: Herbie Baltimore   On: 06/27/2013 17:38   Dg Chest Port 1 View  06/27/2013   *RADIOLOGY REPORT*  Clinical Data: Altered mental status.  Vomiting.  PORTABLE CHEST - 1  VIEW  Comparison: 06/01/2013  Findings: Chin overlies the apices. Cardiomegaly accentuated by AP portable technique.  No pleural fluid.  No definite pneumothorax.  Right infrahilar airspace disease.  Minimal left base volume loss.  IMPRESSION: Right infrahilar airspace disease, suspicious for infection or aspiration.  Cardiomegaly without congestive failure.   Original Report Authenticated By: Jeronimo Greaves, M.D.    Scheduled Meds: . amLODipine  10 mg Oral Q breakfast  . calcitRIOL  0.5 mcg Oral Q breakfast  . cefTRIAXone (ROCEPHIN)  IV  1 g Intravenous  Q24H  . enoxaparin (LOVENOX) injection  30 mg Subcutaneous Q24H  . famotidine  20 mg Oral Daily  . fentaNYL  50 mcg Transdermal Q3 days  . gabapentin  300 mg Oral BID  . insulin aspart  0-9 Units Subcutaneous TID WC  . insulin detemir  10 Units Subcutaneous Daily  . ketoconazole  1 application Topical BID  . labetalol  300 mg Oral TID  . latanoprost  1 drop Both Eyes QHS  . levothyroxine  224 mcg Oral QAC breakfast  . mycophenolate  500 mg Oral BID  . potassium chloride  20 mEq Oral Daily  . predniSONE  10 mg Oral Q breakfast  . sirolimus  3 mg Oral Daily   Continuous Infusions: . sodium chloride 50 mL/hr at 06/27/13 2339   PRN Meds:acetaminophen, acetaminophen  Assessment/Plan:  PNEUMONIA: Fever-up to 103F- now with bilateral patchy upper lobe infiltrates. On broad spectrum Abx. WBC sl better  Altered mental status- Will get neuro to see History of recurrent UTI-based on multiple cultures, enterococcus predominate, penicillin allergy, will treat with relatively broad-spectrum antibiotics awaiting results of urine culture, blood cultures, will treat with vancomycin and Rocephin, catheterized specimen pending   Diabetes mellitus with microvascular complications-low in the early AM, reduce determir Hypothyroidism-continue home medication regimen check TSH  Hypertension-BP a bit soft. Reduce labetolol to BID GERD-continue H2-blocker  Chronic kidney disease status post renal transplant now with stage III chronic kidney disease followed by nephrology same overall Underlying dementia : was to see neuro as an outpt.  Gait abnormality with history of back surgery, ruptured disc, on chronic pain medications, using a walker at baseline, no recent falls per husband report Anemia: has dropped with hydration    LOS: 1 day   [name] 06/28/2013, 8:28 AM

## 2013-06-28 NOTE — Consult Note (Signed)
I have personally seen and examined this patient and agree with the assessment/plan as outlined above by Benjamin Stain MD (PGY2). Patient with altered mental status which appears to be encephalopathy from medications (neurontin) v/s infection (HCAP >>> recurrent UTI). Continue current immunosuppresion and start BSABx with gentle IVFs. Work up underway. Ezmae Speers K.,MD 06/28/2013 2:50 PM

## 2013-06-28 NOTE — Consult Note (Signed)
Reason for Consult: H/o renal transplant Referring Physician: Evlyn Kanner  [name] is an 72 y.o. female.  HPI: AMS, leukocytosis with UTI vs pneumonia, and h/o renal transplant 2007  Hx per patient's husband and chart review. Husband states patient was a little more confused than baseline 3 days ago and the following day had worsening chills and mentation. Overnight, blood sugar also dropped to 30, improving to 123 with juice/syrup/peanut butter. She also had one episode nonbloody emesis and he brought her into ED, where she was found to have UA with trace leukocytes, 30 protein, 100 glucose, and 3-6 WBC, CXR with bilateral pulmonary infiltrates, and temp of 103F yesterday at 6pm. Husband denies cough, dyspnea, or noticeable chest pain.   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 She has been on chronic macrobid along with other specific antibiotic therapies for UTIs since then. Her mentation has also steadily been declining over last few months per husband, with some acute worsening thought due to infections. Patient planned for outpatient neurology f/u but has not made it to this yet. Head CTs have been negative for acute findings in the past. Per husband, main indicator of infection in patient is "acting differently",  with increased hand jerking, difficulty walking, and mouth quivering. He feels she has had these symptoms lately, along with increased confusion and peeing less than normal 3-4 times daily.   Past Medical History  Diagnosis Date  . Diabetes mellitus   . Diabetic neuropathy   . Hypothyroidism   . Hypertension   . GERD (gastroesophageal reflux disease)   . Heart murmur     not treated for  . Arthritis   . Chronic kidney disease     RENAL TRANSPLANT  IN 2008--PT IS FOLLOWED BY DR. Karie Fetch WITH BUN OF 37 AND CREAT 1.7--PER NOTE DR. NESI FROM 04/11/12  . Urethral diverticulum   . Eyesight diminished     GLAUCOMA   . History of shingles     LEFT EYE JAN 2012--STILL HAS EYE PAIN-STATES NOT ABLE TO TAKE THE MEDICATION FOR SHINGLES BECAUSE OF HER HX OF KIDNEY TRANSPLANT  . Fingernail abnormalities     FUNGUS OF FINGERNAILS  . UTI (urinary tract infection) 05/18/2013  Nephrologist - Dr. Eliott Nine. 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 Kidney transplant  2008    At [facility]  . Back surgery      ruptured disk  . Abdominal hysterectomy    . Appendectomy    . Laser surgery of both eyes for hemorrhages    . 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 Urethral diverticulectomy  04/28/2012    Procedure: URETHRAL DIVERTICULECTOMY;  Surgeon: Antony Haste, MD;  Location: [facility];  Service: Urology;  Laterality: N/A;    Family History  Problem Relation Age of Onset  . Anesthesia problems Neg Hx   No h/o kidney problems, vision/hearing, or MSK problems Sister has h/o back pain  Social History:  reports that she quit smoking about 37  years ago. She has never used smokeless tobacco. She reports that she does not drink alcohol or use illicit drugs. Mentation: Husband helps her with medications though she is usually pretty self-sufficient, Walks with walker, can communicate relatively well (ie name and birthday), able to swallow tablets easily with husband's help Lives with husband No tobacco, drugs, or drugs Baseline, pt is able to get around house with walker, can communicate name and birthday. .  Allergies:  Allergies  Allergen  Reactions  . Adhesive [Tape]     Skin irritation, "pulls the skin off"   . Aspirin     Crawling feeling under skin  . Hydrocodone Other (See Comments)    Hallucinations   . Iohexol      Desc: pt unable to tell what the reaction was   . Oxycodone     hallucinations  . Penicillins Hives and Swelling    Has tolerated cefazolin and ceftriaxone.  . Tacrolimus Other (See Comments)    Hallucinations   . Contrast Media [Iodinated Diagnostic Agents] Hives, Swelling and Rash    Medications: Reviewed   Results for orders placed during the hospital encounter of 06/27/13 (from the past 48 hour(s))  COMPREHENSIVE METABOLIC PANEL     Status: Abnormal   Collection Time    06/27/13  3:40 PM      Result Value Range   Sodium 142  135 - 145 mEq/L   Potassium 3.9  3.5 - 5.1 mEq/L   Chloride 97  96 - 112 mEq/L   CO2 34 (*) 19 - 32 mEq/L   Glucose, Bld 197 (*) 70 - 99 mg/dL   BUN 33 (*) 6 - 23 mg/dL   Creatinine, Ser 4.54 (*) 0.50 - 1.10 mg/dL   Calcium 09.8  8.4 - 11.9 mg/dL   Total Protein 7.0  6.0 - 8.3 g/dL   Albumin 3.5  3.5 - 5.2 g/dL   AST 18  0 - 37 U/L   ALT 7  0 - 35 U/L   Alkaline Phosphatase 41  39 - 117 U/L   Total Bilirubin 0.8  0.3 - 1.2 mg/dL   GFR calc non Af Amer 27 (*) >90 mL/min   GFR calc Af Amer 32 (*) >90 mL/min   Comment: (NOTE)     The eGFR has been calculated using the CKD EPI equation.     This calculation has not been validated in all clinical situations.     eGFR's persistently <90 mL/min signify possible Chronic Kidney     Disease.  GLUCOSE, CAPILLARY     Status: Abnormal   Collection Time    06/27/13  3:40 PM      Result Value Range   Glucose-Capillary 210 (*) 70 - 99 mg/dL  CBC WITH DIFFERENTIAL     Status: Abnormal   Collection Time    06/27/13  3:52 PM      Result Value Range   WBC 13.0 (*) 4.0 - 10.5 K/uL   RBC 4.72  3.87 - 5.11 MIL/uL   Hemoglobin 13.4  12.0 - 15.0 g/dL   HCT 14.7  82.9 - 56.2 %   MCV 85.0  78.0 - 100.0 fL   MCH 28.4  26.0 -  34.0 pg   MCHC 33.4  30.0 - 36.0 g/dL   RDW 13.0 (*) 86.5 - 78.4 %   Platelets 185  150 - 400 K/uL   Neutrophils Relative % 92 (*) 43 - 77 %   Lymphocytes Relative 6 (*) 12 -  46 %   Monocytes Relative 2 (*) 3 - 12 %   Eosinophils Relative 0  0 - 5 %   Basophils Relative 0  0 - 1 %   Neutro Abs 11.9 (*) 1.7 - 7.7 K/uL   Lymphs Abs 0.8  0.7 - 4.0 K/uL   Monocytes Absolute 0.3  0.1 - 1.0 K/uL   Eosinophils Absolute 0.0  0.0 - 0.7 K/uL   Basophils Absolute 0.0  0.0 - 0.1 K/uL   Smear Review MORPHOLOGY UNREMARKABLE    CG4 I-STAT (LACTIC ACID)     Status: Abnormal   Collection Time    06/27/13  4:52 PM      Result Value Range   Lactic Acid, Venous 2.49 (*) 0.5 - 2.2 mmol/L  URINALYSIS, ROUTINE W REFLEX MICROSCOPIC     Status: Abnormal   Collection Time    06/27/13  6:34 PM      Result Value Range   Color, Urine YELLOW  YELLOW   APPearance CLEAR  CLEAR   Specific Gravity, Urine 1.014  1.005 - 1.030   pH 5.5  5.0 - 8.0   Glucose, UA 100 (*) NEGATIVE mg/dL   Hgb urine dipstick NEGATIVE  NEGATIVE   Bilirubin Urine NEGATIVE  NEGATIVE   Ketones, ur NEGATIVE  NEGATIVE mg/dL   Protein, ur 30 (*) NEGATIVE mg/dL   Urobilinogen, UA 0.2  0.0 - 1.0 mg/dL   Nitrite NEGATIVE  NEGATIVE   Leukocytes, UA TRACE (*) NEGATIVE  URINE MICROSCOPIC-ADD ON     Status: None   Collection Time    06/27/13  6:34 PM      Result Value Range   Squamous Epithelial / LPF RARE  RARE   WBC, UA 3-6  <3 WBC/hpf   Bacteria, UA RARE  RARE  GLUCOSE, CAPILLARY     Status: Abnormal   Collection Time    06/27/13  9:11 PM      Result Value Range   Glucose-Capillary 123 (*) 70 - 99 mg/dL   Comment 1 Notify RN    CBC     Status: Abnormal   Collection Time    06/28/13  4:22 AM      Result Value Range   WBC 10.5  4.0 - 10.5 K/uL   RBC 3.68 (*) 3.87 - 5.11 MIL/uL   Hemoglobin 10.0 (*) 12.0 - 15.0 g/dL   Comment: DELTA CHECK NOTED     REPEATED TO VERIFY   HCT 31.5 (*) 36.0 - 46.0 %   MCV 85.6  78.0 - 100.0 fL    MCH 27.2  26.0 - 34.0 pg   MCHC 31.7  30.0 - 36.0 g/dL   RDW 16.1 (*) 09.6 - 04.5 %   Platelets 176  150 - 400 K/uL  BASIC METABOLIC PANEL     Status: Abnormal   Collection Time    06/28/13  4:22 AM      Result Value Range   Sodium 142  135 - 145 mEq/L   Potassium 3.3 (*) 3.5 - 5.1 mEq/L   Chloride 99  96 - 112 mEq/L   CO2 34 (*) 19 - 32 mEq/L   Glucose, Bld 50 (*) 70 - 99 mg/dL   BUN 34 (*) 6 - 23 mg/dL   Creatinine, Ser 4.09 (*) 0.50 - 1.10 mg/dL   Calcium 8.9  8.4 - 81.1 mg/dL   GFR calc non Af Amer 28 (*) >90 mL/min   GFR calc Af Amer 32 (*) >90 mL/min  Comment: (NOTE)     The eGFR has been calculated using the CKD EPI equation.     This calculation has not been validated in all clinical situations.     eGFR's persistently <90 mL/min signify possible Chronic Kidney     Disease.  GLUCOSE, CAPILLARY     Status: Abnormal   Collection Time    06/28/13  4:23 AM      Result Value Range   Glucose-Capillary 51 (*) 70 - 99 mg/dL   Comment 1 Notify RN    GLUCOSE, CAPILLARY     Status: Abnormal   Collection Time    06/28/13  4:44 AM      Result Value Range   Glucose-Capillary 43 (*) 70 - 99 mg/dL   Comment 1 Notify RN    GLUCOSE, CAPILLARY     Status: None   Collection Time    06/28/13  5:14 AM      Result Value Range   Glucose-Capillary 70  70 - 99 mg/dL  GLUCOSE, CAPILLARY     Status: Abnormal   Collection Time    06/28/13  8:53 AM      Result Value Range   Glucose-Capillary 68 (*) 70 - 99 mg/dL  GLUCOSE, CAPILLARY     Status: None   Collection Time    06/28/13  9:43 AM      Result Value Range   Glucose-Capillary 98  70 - 99 mg/dL    X-ray Chest Pa And Lateral   06/28/2013   *RADIOLOGY REPORT*  Clinical Data: Pneumonia.  CHEST - 2 VIEW  Comparison: 06/27/2013  Findings: Patchy infiltrates are present in the right upper and lower lung zones.  There may also be subtle infiltrate in the left upper lung.  No evidence of overt edema or pleural fluid.  There is stable  cardiomegaly.  IMPRESSION: Patchy right sided pulmonary infiltrates and possible component of left upper lobe pneumonia as well.   Original Report Authenticated By: Irish Lack, M.D.   Ct Head Wo Contrast  06/27/2013   CLINICAL DATA:  Altered mental status. Diabetes. Emesis.  EXAM: CT HEAD WITHOUT CONTRAST  TECHNIQUE: Contiguous axial images were obtained from the base of the skull through the vertex without intravenous contrast.  COMPARISON:  05/21/2013  FINDINGS: The brainstem, cerebellum, cerebral peduncles, thalamus, basal ganglia, basilar cisterns, and ventricular system appear within normal limits. Periventricular white matter and corona radiata hypodensities favor chronic ischemic microvascular white matter disease. No intracranial hemorrhage, mass lesion, or acute CVA.  Atherosclerotic calcification of the carotid siphons noted.  IMPRESSION: 1. No acute intracranial findings. 2. Chronic ischemic microvascular white matter disease.   Electronically Signed   By: Herbie Baltimore   On: 06/27/2013 17:38   Dg Chest Port 1 View  06/27/2013   *RADIOLOGY REPORT*  Clinical Data: Altered mental status.  Vomiting.  PORTABLE CHEST - 1 VIEW  Comparison: 06/01/2013  Findings: Chin overlies the apices. Cardiomegaly accentuated by AP portable technique.  No pleural fluid.  No definite pneumothorax.  Right infrahilar airspace disease.  Minimal left base volume loss.  IMPRESSION: Right infrahilar airspace disease, suspicious for infection or aspiration.  Cardiomegaly without congestive failure.   Original Report Authenticated By: Jeronimo Greaves, M.D.    ROS - Per HPI  Blood pressure 121/73, pulse 76, temperature 99.6 F (37.6 C), temperature source Axillary, resp. rate 20, height 5' 4.8" (1.646 m), weight 126 lb 3.2 oz (57.244 kg), SpO2 97.00%. Physical Examination: General appearance - awake, weak-appearing  Mental  status - alert, oriented to person, place, and time, confused, drowsy Eyes - pupils 1mm, EOMI,  sclera clear with left eye pingecula, unable to visualize retina Mouth - dry mucus membranes, upper and lower dentures in place, no exudate or erythema Neck - supple, no significant adenopathy Lymphatics - no palpable lymphadenopathy, no hepatosplenomegaly Chest - no tachypnea, retractions or cyanosis, bilateral crackles throughout, poor inspiratory effort due to back pain, no cough Heart - normal rate and regular rhythm, no gallops noted, systolic murmur 3/6 heard best at 2nd right intercostal space but heard throughout the precordium, no JVD Abdomen - soft, nontender, nondistended, no masses or organomegaly No suprapubic tenderness, no tenderness over kidney transplant site (right lower quadrant) No suprapubic mass Neurological - Awake, drowsy-appearing but responds to her name, oriented to person but not place or time [facility] and no answer to time or president), weak-appearing, occasional lip-pursing/smacking and tongue movements, constant dystonic hand movements left>right Musculoskeletal - no joint tenderness, deformity or swelling, no muscular tenderness noted, 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 female with h/o ESRD s/p renal transplant, IDDM, hypothyroidism, HTN, HLD, recurrent UTIs, and 3-6 months of increasing confusion presenting with AMS and ?UTI vs pneumonia.  1. ESRD: Secondary to DM. S/p renal cadaveric transplant 02/08/06 with baseline Cr 2, currently at baseline at 1.76. - Continuing on home cellcept 500mg  BID, prednisone 10mg  daily, and sirolimus 3mg  daily, 8/19 sirolimus level 11.3 (WNL) - Check renal function panel daily and AM sirolimus level - With dry mucus membranes, increase to D5 NS at 100cc/hr x 10 hrs and  then re-evaluate.  - Calcitriol 0.40mcg daily  2. AMS with ?UTI and pneumonia - On admission, febrile and left-shift leukocytosis.  Urine with trace leuks and 3-6 WBC and 30 protein, 100 glucose. Non-acute head CT. Chronic macrobid with urethral diverticulum. Currently, vitals stable, last febrile to 102.5 at 8pm 9/7. Outs reported at 350cc, likely underreported. Lactic acid mild elevation 2.49.  - Recently hospitalized 7/29-8/2 for UTI and AMS, IV abx and discharged on cefuroxime to cover klebsiella oxytoca and enterococcus (previously sn to vanc) though cx grew nothing.  - Then cefpodoxime 200mg  BID for 10 d completed 8/20 due to recurrent UTI with unknown bacteria - Recurrent enterococcal UTI 8/19 sample, started macrobid 8/26 (delay of culture results) - Currently on vanc per pharmacy and ceftriaxone for broad cvg, pending urine and blood cultures - Hydration per above - Monitor vitals for signs of sepsis - Swallow study - With GFR<50, cannot concentrate macrobid into urine and likely will not be prophylaxing against UTI so recommend stopping.  - May be due to neurontin toxicity. Will drop to QHS from BID per Dr. Elza Rafter clinic recommendation.  - Recurrent UTI - Abd [place] to eval renal allograft in RLQ  3. Hypokalemia to 3.3 today - continue Kdur daily - Recheck tomorrow, d/c kdur if >=4 tomorrow  4. HTN - Per primary. Norvasc 10 and labetalol 300mg  BID. Systolic 130-140s in clinic  5. Anemia - 06/08/13 - iron 60, iron sat 30%, TIBC low at 199, ferritin 890 (high) - Likely anemia of chronic kidney disease. Hgb 13 on admission, 10 today, likely dilution - Recheck tomorrow  6. Weight loss: Weight June 2014 142, July 135, Aug 26 128lbs. Likely malnourished,  probable cause of chronic metabolic alkalosis - Possibly due to declininc mentation - f/u with neuro and further w/u per primary. - Nepro for nutr supplement  7. "encephalopathy" with movement disorder, progressive intermittent  confusion and disorientation, orofacial dyskinetic movements, worsening over 3-4 months - plan for neuro f/u  8. IDDM with peripheral neuopathy - Per primary. 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 We will continue to follow along with this patient.   Simone Curia 06/28/2013, 10:36 AM

## 2013-06-28 NOTE — Progress Notes (Signed)
EEG Completed; Results Pending  

## 2013-06-28 NOTE — Progress Notes (Signed)
Hypoglycemic Event  CBG: 68  Treatment: 15 GM carbohydrate snack  Symptoms: None  Follow-up CBG: [id number]  CBG Result:98  Possible Reasons for Event: Medication regimen: too much levemir?  Comments/MD notified: Will wait and recheck sugar after pt finishes breakfast of pancakes and syrup    Laural Benes, Shammond Arave D  Remember to initiate Hypoglycemia Order Set & complete

## 2013-06-29 ENCOUNTER — Inpatient Hospital Stay (HOSPITAL_COMMUNITY): Payer: Medicare Other

## 2013-06-29 LAB — CBC
HCT: 32.2 % — ABNORMAL LOW (ref 36.0–46.0)
Hemoglobin: 10.3 g/dL — ABNORMAL LOW (ref 12.0–15.0)
MCH: 27.2 pg (ref 26.0–34.0)
MCHC: 32 g/dL (ref 30.0–36.0)
MCV: 85 fL (ref 78.0–100.0)
Platelets: 206 10*3/uL (ref 150–400)
RBC: 3.79 MIL/uL — ABNORMAL LOW (ref 3.87–5.11)
RDW: 17 % — ABNORMAL HIGH (ref 11.5–15.5)
WBC: 11.7 10*3/uL — ABNORMAL HIGH (ref 4.0–10.5)

## 2013-06-29 LAB — GLUCOSE, CAPILLARY
Glucose-Capillary: 205 mg/dL — ABNORMAL HIGH (ref 70–99)
Glucose-Capillary: 255 mg/dL — ABNORMAL HIGH (ref 70–99)

## 2013-06-29 LAB — COMPREHENSIVE METABOLIC PANEL
ALT: 5 U/L (ref 0–35)
AST: 16 U/L (ref 0–37)
Albumin: 2.8 g/dL — ABNORMAL LOW (ref 3.5–5.2)
Alkaline Phosphatase: 39 U/L (ref 39–117)
BUN: 34 mg/dL — ABNORMAL HIGH (ref 6–23)
CO2: 31 mEq/L (ref 19–32)
Calcium: 9.1 mg/dL (ref 8.4–10.5)
Chloride: 100 mEq/L (ref 96–112)
Creatinine, Ser: 1.65 mg/dL — ABNORMAL HIGH (ref 0.50–1.10)
GFR calc Af Amer: 34 mL/min — ABNORMAL LOW (ref >90)
GFR calc non Af Amer: 30 mL/min — ABNORMAL LOW (ref >90)
Glucose, Bld: 195 mg/dL — ABNORMAL HIGH (ref 70–99)
Potassium: 3.8 mEq/L (ref 3.5–5.1)
Sodium: 139 mEq/L (ref 135–145)
Total Bilirubin: 0.5 mg/dL (ref 0.3–1.2)
Total Protein: 6.3 g/dL (ref 6.0–8.3)

## 2013-06-29 NOTE — Progress Notes (Signed)
Subjective: Didn't sleep well, now more sleepy. Ate better yesterday. No fever, no more vomiting. No pain For [place] this AM. ST to see for swallowing. EEG nonspecific  Objective: Vital signs in last 24 hours: Temp:  [98.4 F (36.9 C)-99.1 F (37.3 C)] 98.4 F (36.9 C) (09/09 0513) Pulse Rate:  [76-115] 76 (09/09 0513) Resp:  [18] 18 (09/09 0513) BP: (121-148)/(50-109) 148/66 mmHg (09/09 0513) SpO2:  [91 %-95 %] 95 % (09/09 0513) Weight:  [58.6 kg (129 lb 3 oz)] 58.6 kg (129 lb 3 oz) (09/09 0513)  Intake/Output from previous day: 09/08 0701 - 09/09 0700 In: 1348.3 [P.O.:120; I.V.:1228.3] Out: 201 [Urine:200; Stool:1] Intake/Output this shift:    Somnolent but awakable. Some perirorbital edema. Lungs: a few left rhonchi Ht regular with harsh murmur. Somnolent, confused  Lab Results   Recent Labs  06/28/13 0422 06/29/13 0540  WBC 10.5 11.7*  RBC 3.68* 3.79*  HGB 10.0* 10.3*  HCT 31.5* 32.2*  MCV 85.6 85.0  MCH 27.2 27.2  RDW 17.1* 17.0*  PLT 176 206    Recent Labs  06/28/13 0422 06/29/13 0540  NA 142 139  K 3.3* 3.8  CL 99 100  CO2 34* 31  GLUCOSE 50* 195*  BUN 34* 34*  CREATININE 1.75* 1.65*  CALCIUM 8.9 9.1  Results for Erika, Russo (MRN [id number]) as of 06/29/2013 08:34  Ref. Range 06/29/2013 05:40  Alkaline Phosphatase Latest Range: 39-117 U/L 39  Albumin Latest Range: 3.5-5.2 g/dL 2.8 (L)  AST Latest Range: 0-37 U/L 16  ALT Latest Range: 0-35 U/L 5  Total Protein Latest Range: 6.0-8.3 g/dL 6.3  Total Bilirubin Latest Range: 0.3-1.2 mg/dL 0.5    Studies/Results: X-ray Chest Pa And Lateral   06/28/2013   *RADIOLOGY REPORT*  Clinical Data: Pneumonia.  CHEST - 2 VIEW  Comparison: 06/27/2013  Findings: Patchy infiltrates are present in the right upper and lower lung zones.  There may also be subtle infiltrate in the left upper lung.  No evidence of overt edema or pleural fluid.  There is stable cardiomegaly.  IMPRESSION: Patchy right sided pulmonary infiltrates  and possible component of left upper lobe pneumonia as well.   Original Report Authenticated By: Irish Lack, M.D.   Ct Head Wo Contrast  06/27/2013   CLINICAL DATA:  Altered mental status. Diabetes. Emesis.  EXAM: CT HEAD WITHOUT CONTRAST  TECHNIQUE: Contiguous axial images were obtained from the base of the skull through the vertex without intravenous contrast.  COMPARISON:  05/21/2013  FINDINGS: The brainstem, cerebellum, cerebral peduncles, thalamus, basal ganglia, basilar cisterns, and ventricular system appear within normal limits. Periventricular white matter and corona radiata hypodensities favor chronic ischemic microvascular white matter disease. No intracranial hemorrhage, mass lesion, or acute CVA.  Atherosclerotic calcification of the carotid siphons noted.  IMPRESSION: 1. No acute intracranial findings. 2. Chronic ischemic microvascular white matter disease.   Electronically Signed   By: Herbie Baltimore   On: 06/27/2013 17:38   Dg Chest Port 1 View  06/27/2013   *RADIOLOGY REPORT*  Clinical Data: Altered mental status.  Vomiting.  PORTABLE CHEST - 1 VIEW  Comparison: 06/01/2013  Findings: Chin overlies the apices. Cardiomegaly accentuated by AP portable technique.  No pleural fluid.  No definite pneumothorax.  Right infrahilar airspace disease.  Minimal left base volume loss.  IMPRESSION: Right infrahilar airspace disease, suspicious for infection or aspiration.  Cardiomegaly without congestive failure.   Original Report Authenticated By: Jeronimo Greaves, M.D.    Scheduled Meds: . amLODipine  10 mg Oral Q breakfast  . calcitRIOL  0.5 mcg Oral Q breakfast  . cefTRIAXone (ROCEPHIN)  IV  1 g Intravenous Q24H  . enoxaparin (LOVENOX) injection  30 mg Subcutaneous Q24H  . famotidine  20 mg Oral Daily  . feeding supplement (NEPRO CARB STEADY)  237 mL Oral BID BM  . fentaNYL  50 mcg Transdermal Q3 days  . gabapentin  300 mg Oral QHS  . insulin aspart  0-9 Units Subcutaneous TID WC  . insulin  detemir  10 Units Subcutaneous Daily  . ketoconazole  1 application Topical BID  . labetalol  300 mg Oral BID  . latanoprost  1 drop Both Eyes QHS  . levothyroxine  224 mcg Oral QAC breakfast  . mycophenolate  500 mg Oral BID  . potassium chloride  20 mEq Oral Daily  . predniSONE  10 mg Oral Q breakfast  . sirolimus  3 mg Oral Daily  . vancomycin  500 mg Intravenous Q24H   Continuous Infusions: . dextrose 5 % and 0.9% NaCl 50 mL/hr at 06/29/13 0051   PRN Meds:acetaminophen, acetaminophen  Assessment/Plan:   PNEUMONIA: no more fever. sats OK. On vanco and rocephin. WBC up a little at 11.7 Altered mental status- EEG and CT OK. No MRI ordered History of recurrent UTI: no new data Diabetes mellitus with microvascular complications: NPO this AM> BS's better  Hypothyroidism-continue home medication regimen check TSH  Hypertension-Improved  GERD-continue H2-blocker  Chronic kidney disease status post renal transplant now with stage III chronic kidney disease followed by nephrology: [place] to look at graft scheduled. Cr better at 1.65  Underlying dementia : was to see neuro as an outpt.  Gait abnormality with history of back surgery, ruptured disc, on chronic pain medications, using a walker at baseline, no recent falls per husband report  Anemia: has dropped with hydration but now stable Hypokalemia: resolved PROTEIN CALORIE MALNUTRITION/ADULT FTT: Alb low at 2.8  LOS: 2 days   [name] 06/29/2013, 8:30 AM

## 2013-06-29 NOTE — Progress Notes (Addendum)
[facility] Progress Note    Assessment/ Plan:   1. ESRD: Secondary to DM. 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  - Continue on home cellcept 500mg  BID, prednisone 10mg  daily, and sirolimus 3mg  daily - f/u sirolimus level - continue D5NS @ 50 for hydration  - continue Calcitriol 0.88mcg daily   2. AMS - EEG consistent with encephalopathy (triphasic waves and irregular delta activity). Most likely due to neurontin toxicity vs. HCAP vs. Recurrent UTI - Currently on vanc per pharmacy and ceftriaxone for broad cvg, pending blood cultures (NTD). UA with trace leuks, so culture not sent - Hydration per above  - Monitor vitals for signs of sepsis  - Swallow study not done yet (eval for aspiration) - With GFR<50, macrobid will NOT concentrate into urine and provide no ppx against UTI, recommend stopping.  - May be due to neurontin toxicity. Continue QHS dosing of neurontin (decreased from TID) - Recurrent UTI - Abd [place] to eval renal allograft in RLQ --should be done today  3. Hypokalemia - Resolved. Will discontinue KCL if potassium >4 but continue for now  4. HTN - Per primary. Norvasc 10 and labetalol 300mg  BID.  5. Anemia - Saturation ratio >=30 and stable. Will continue to monitor.  6. Weight loss: Likely malnourished and cause of chronic metabolic alkalosis. Will continue to monitor. - continue Nepro BID between meals   7. "encephalopathy" with movement disorder, progressive intermittent confusion and disorientation, orofacial dyskinetic movements, worsening over 3-4 months  - no sz activity seen on EEG. Neuro recommends outpatient followup for evaluation of dementia  8. IDDM with peripheral neuopathy - Per primary. 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 Able to communicate that she is not in pain or SOB. Still with significant disorientation and only able to state her name.  Her husband states she is doing okay with mild cough but increased appetite and mentation that is near baseline of last few months.   Objective:   BP 148/66  Pulse 76  Temp(Src) 98.4 F (36.9 C) (Oral)  Resp 18  Ht 5' 4.8" (1.646 m)  Wt 58.6 kg (129 lb 3 oz)  BMI 21.63 kg/m2  SpO2 95%  Intake/Output Summary (Last 24 hours) at 06/29/13 0738 Last data filed at 06/29/13 0555  Gross per 24 hour  Intake 1348.33 ml  Output    201 ml  Net 1147.33 ml   Weight change: 1.356 kg (2 lb 15.8 oz)  Physical Exam: General appearance - alert, awake, weak-appearing  Eyes - pupils 1mm, EOMI, sclera clear with left eye pingecula  Mouth - moderately dry mucus membranes, no exudate or erythema  Neck - supple, no significant adenopathy  Lymphatics - no palpable lymphadenopathy, no hepatosplenomegaly  Chest - no tachypnea, retractions or cyanosis, bilateral crackles throughout, poor inspiratory effort due to back pain, no cough  Heart - normal rate and regular rhythm, no gallops noted, systolic murmur 3/6 heard best at 2nd right intercostal space but heard throughout the precordium, no JVD Abdomen - NABS, soft, nontender, nondistended, no masses or organomegaly, no suprapubic mass or tenderness, no tenderness over kidney transplant site (right lower quadrant)  Neuro- Awake,  drowsy-appearing but responds to her name, no longer with hand or mouth dystonic movements Musculoskeletal - no joint tenderness, deformity or swelling, no muscular tenderness noted, 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  CHEST - 2 VIEW  Comparison: 06/27/2013  Findings: Patchy infiltrates are present in the right upper and lower lung zones.  There may also be subtle infiltrate in the left upper lung.  No evidence of overt edema or pleural fluid.  There is stable cardiomegaly.  IMPRESSION: Patchy right sided pulmonary infiltrates and possible component of left upper lobe pneumonia as well.   Original Report Authenticated By: Irish Lack, M.D.   Ct Head Wo Contrast  06/27/2013   CLINICAL DATA:  Altered mental status. Diabetes. Emesis.  EXAM: CT HEAD WITHOUT CONTRAST  TECHNIQUE: Contiguous axial images were obtained from the base of the skull through the vertex without intravenous contrast.  COMPARISON:  05/21/2013  FINDINGS: The brainstem, cerebellum, cerebral peduncles, thalamus, basal ganglia, basilar cisterns, and ventricular system appear within normal limits. Periventricular white matter and corona radiata hypodensities favor chronic ischemic microvascular white matter disease. No intracranial hemorrhage, mass lesion, or acute CVA.  Atherosclerotic calcification of the carotid siphons noted.  IMPRESSION: 1. No acute intracranial findings. 2. Chronic ischemic microvascular white matter disease.   Electronically Signed   By: Herbie Baltimore   On: 06/27/2013 17:38   Dg Chest Port 1 View  06/27/2013   *RADIOLOGY REPORT*  Clinical Data: Altered mental status.  Vomiting.  PORTABLE CHEST - 1 VIEW  Comparison: 06/01/2013  Findings: Chin overlies the apices. Cardiomegaly accentuated by AP portable technique.  No pleural fluid.  No definite pneumothorax.  Right infrahilar airspace disease.  Minimal left base volume loss.  IMPRESSION: Right infrahilar airspace disease, suspicious for infection or aspiration.  Cardiomegaly without congestive failure.   Original Report Authenticated  By: Jeronimo Greaves, M.D.    Labs: BMET  Recent Labs Lab 06/27/13 1540 06/28/13 0422 06/29/13 0540  NA 142 142 139  K 3.9 3.3* 3.8  CL 97 99 100  CO2 34* 34* 31  GLUCOSE 197* 50* 195*  BUN 33* 34* 34*  CREATININE 1.76* 1.75* 1.65*  CALCIUM 10.0 8.9 9.1   CBC  Recent Labs Lab 06/27/13 1552 06/28/13 0422 06/29/13 0540  WBC 13.0* 10.5 11.7*  NEUTROABS 11.9*  --   --   HGB 13.4 10.0* 10.3*  HCT 40.1 31.5* 32.2*  MCV 85.0 85.6 85.0  PLT 185 176 206    Medications:    . amLODipine  10 mg Oral Q breakfast  . calcitRIOL  0.5 mcg Oral Q breakfast  . cefTRIAXone (ROCEPHIN)  IV  1 g Intravenous Q24H  . enoxaparin (LOVENOX) injection  30 mg Subcutaneous Q24H  . famotidine  20 mg Oral Daily  . feeding supplement (NEPRO CARB STEADY)  237 mL Oral BID BM  . fentaNYL  50 mcg Transdermal Q3 days  . gabapentin  300 mg Oral QHS  . insulin aspart  0-9 Units Subcutaneous TID WC  . insulin detemir  10 Units Subcutaneous Daily  . ketoconazole  1 application Topical BID  . labetalol  300 mg Oral BID  . latanoprost  1 drop  Both Eyes QHS  . levothyroxine  224 mcg Oral QAC breakfast  . mycophenolate  500 mg Oral BID  . potassium chloride  20 mEq Oral Daily  . predniSONE  10 mg Oral Q breakfast  . sirolimus  3 mg Oral Daily  . vancomycin  500 mg Intravenous Q24H      Lewie Chamber, MS4 06/29/2013, 7:38 AM   I have seen and examined patient and agree with MS4 note. My additions are in the note. Leona Singleton, MD 06/29/2013 9:58 AM   I have personally seen and examined this patient and agree with the assessment/plan as outlined above by Benjamin Stain MD (PGY 2). Doing better with lowered neurontin dose. Seen yesterday by neurology and further management on OP F/U. Lorraine Cimmino K.,MD 06/29/2013 11:31 AM

## 2013-06-29 NOTE — Evaluation (Signed)
Clinical/Bedside Swallow Evaluation Patient Details  Name: [name] MRN: [id number] Date of Birth: [birth year]  Today's Date: 06/29/2013 Time: 1540-1610 SLP Time Calculation (min): 30 min  Past Medical History:  Past Medical History  Diagnosis Date  . Diabetes mellitus   . Diabetic neuropathy   . Hypothyroidism   . Hypertension   . GERD (gastroesophageal reflux disease)   . Heart murmur     not treated for  . Arthritis   . Chronic kidney disease     RENAL TRANSPLANT IN 2008--PT IS FOLLOWED BY DR. Karie Fetch WITH BUN OF 37 AND CREAT 1.7--PER NOTE DR. NESI FROM 04/11/12  . Urethral diverticulum   . Eyesight diminished     GLAUCOMA   . History of shingles     LEFT EYE JAN 2012--STILL HAS EYE PAIN-STATES NOT ABLE TO TAKE THE MEDICATION FOR SHINGLES BECAUSE OF HER HX OF KIDNEY TRANSPLANT  . Fingernail abnormalities     FUNGUS OF FINGERNAILS  . UTI (urinary tract infection) 05/18/2013   Past Surgical History:  Past Surgical History  Procedure Laterality Date  . Kidney transplant  2008    At [facility]  . Back surgery      ruptured disk  . Abdominal hysterectomy    . Appendectomy    . Laser surgery of both eyes for hemorrhages    . 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 Urethral diverticulectomy  04/28/2012    Procedure: URETHRAL DIVERTICULECTOMY;  Surgeon: Antony Haste, MD;  Location: [facility];  Service: Urology;  Laterality: N/A;   HPI:  Pt is a 72 year old female with chronic kidney disease status post renal transplant in 2008, receiving temporary dialysis through a catheter in her left upper extremity, currently on immunosuppressive therapy and multiple hospitalizations for recurrent UTI associated with altered mental status. At baseline she does live at home with her husband, able to transport herself with a walker, complicated by some low back discomfort with history of herniated disc and surgery. She does have some baseline dementia and was scheduled to be  evaluated by neurology this coming week. She has had multiple enterococcal UTIs, treated with a variety of antibiotic regimens, with some cultures growing out multiple pathogens. Recently she has been treated by nephrology with Macrobid per patient husband report, as soon as 7-10 days ago. However over the last one week, patient has developed worsening problems with mental status changes, unable to mobilize her needs and wants, now not ambulating, with nausea and anorexia. Pt had chest X-ray on 06/28/13 which showed patchy right sided pulmonary infiltrates and possible component of left upper lobe pneumonia as well. BSE today to assess swallowing function.    Assessment / Plan / Recommendation Clinical Impression  Pt administered BSE today and demonstrated severe cognitive impairments impacting pt's ability to follow directions, therefore, a limited oral-motor exam was performed. Pt consumed thin liquids via straw due to inability to consume via cup. Pt consumed 8 trials and demonstrated an intermittent cough with less than 20% of trials. Pt also consumed puree textures and demonstrated prolonged AP transit and required multiple swallows. Solid textures were not attempted due to decreased cognitive function. Recommend Dys. 1 textures with thin liquids with full supervision for utilization of compensatory strategies. Suspect pt's overall swallowing function will improve as her overall mentation improves.     Aspiration Risk  Moderate    Diet Recommendation Thin liquid;Dysphagia 1 (Puree)   Liquid Administration via: Straw Medication Administration: Crushed with puree Supervision: Staff  feed patient;Full supervision/cueing for compensatory strategies Compensations: Slow rate;Small sips/bites Postural Changes and/or Swallow Maneuvers: Seated upright 90 degrees;Upright 30-60 min after meal    Other  Recommendations Oral Care Recommendations: Oral care BID   Follow Up Recommendations  24 hour  supervision/assistance;Home health SLP    Frequency and Duration min 2x/week  2 weeks   Pertinent Vitals/Pain No/Denies Pain    SLP Swallow Goals Patient will consume recommended diet without observed clinical signs of aspiration with: Maximal cueing Patient will utilize recommended strategies during swallow to increase swallowing safety with: Maximal cueing   Swallow Study       General Date of Onset: 06/29/13 HPI: Pt is a 72 year old female with chronic kidney disease status post renal transplant in 2008, receiving temporary dialysis through a catheter in her left upper extremity, currently on immunosuppressive therapy and multiple hospitalizations for recurrent UTI associated with altered mental status. At baseline she does live at home with her husband, able to transport herself with a walker, complicated by some low back discomfort with history of herniated disc and surgery. She does have some baseline dementia and was scheduled to be evaluated by neurology this coming week. She has had multiple enterococcal UTIs, treated with a variety of antibiotic regimens, with some cultures growing out multiple pathogens. Recently she has been treated by nephrology with Macrobid per patient husband report, as soon as 7-10 days ago. However over the last one week, patient has developed worsening problems with mental status changes, unable to mobilize her needs and wants, now not ambulating, with nausea and anorexia. Pt had chest X-ray on 06/28/13 which showed patchy right sided pulmonary infiltrates and possible component of left upper lobe pneumonia as well. BSE today to assess swallowing function.  Type of Study: Bedside swallow evaluation Previous Swallow Assessment: N/A Diet Prior to this Study: Regular;Thin liquids Temperature Spikes Noted: No Respiratory Status: Room air History of Recent Intubation: No Behavior/Cognition: Confused;Alert;Distractible;Requires cueing Oral Cavity - Dentition:  Dentures, top;Dentures, bottom Self-Feeding Abilities: Needs assist Patient Positioning: Upright in bed Baseline Vocal Quality: Clear Volitional Cough: Cognitively unable to elicit Volitional Swallow: Unable to elicit    Oral/Motor/Sensory Function Overall Oral Motor/Sensory Function:  (limited due to cognitive function ) Labial ROM: Within Functional Limits Lingual ROM: Within Functional Limits Lingual Symmetry: Within Functional Limits   Ice Chips Ice chips: Within functional limits   Thin Liquid Thin Liquid: Impaired Presentation: Straw Pharyngeal  Phase Impairments: Decreased hyoid-laryngeal movement;Throat Clearing - Delayed Other Comments: pt with intermittent cough with less than 20% of trials via straw. Unable to accept PO's from cup due to cognitive function.     Nectar Thick Nectar Thick Liquid: Not tested   Honey Thick Honey Thick Liquid: Not tested   Puree Puree: Impaired Presentation: Spoon Oral Phase Impairments: Impaired anterior to posterior transit Pharyngeal Phase Impairments: Suspected delayed Swallow;Multiple swallows   Solid   GO    Solid: Not tested       Feliberto Gottron 06/29/2013,4:42 PM  Feliberto Gottron, MA, CCC-SLP [phone]

## 2013-06-29 NOTE — Progress Notes (Signed)
SLP Cancellation Note  Patient Details Name: [name] MRN: [id number] DOB: 09/21/1941   Cancelled treatment:       BSE was not administered this morning due to pt NPO for procedure. Will attempt again this afternoon.    [name] 06/29/2013, 9:39 AM  Feliberto Gottron, MA, CCC-SLP [phone]

## 2013-06-30 LAB — GLUCOSE, CAPILLARY
Glucose-Capillary: 240 mg/dL — ABNORMAL HIGH (ref 70–99)
Glucose-Capillary: 308 mg/dL — ABNORMAL HIGH (ref 70–99)
Glucose-Capillary: 241 mg/dL — ABNORMAL HIGH (ref 70–99)

## 2013-06-30 LAB — BASIC METABOLIC PANEL
BUN: 28 mg/dL — ABNORMAL HIGH (ref 6–23)
Calcium: 9 mg/dL (ref 8.4–10.5)
Chloride: 100 mEq/L (ref 96–112)
Creatinine, Ser: 1.36 mg/dL — ABNORMAL HIGH (ref 0.50–1.10)
GFR calc Af Amer: 43 mL/min — ABNORMAL LOW (ref >90)
GFR calc non Af Amer: 37 mL/min — ABNORMAL LOW (ref >90)

## 2013-06-30 LAB — VANCOMYCIN, TROUGH: Vancomycin Tr: 15.4 ug/mL (ref 10.0–20.0)

## 2013-06-30 LAB — CBC
MCHC: 31 g/dL (ref 30.0–36.0)
Platelets: 210 10*3/uL (ref 150–400)
RDW: 17 % — ABNORMAL HIGH (ref 11.5–15.5)
WBC: 9.1 10*3/uL (ref 4.0–10.5)

## 2013-06-30 MED ORDER — INSULIN DETEMIR 100 UNIT/ML ~~LOC~~ SOLN
15.0000 [IU] | Freq: Every day | SUBCUTANEOUS | Status: DC
Start: 2013-06-30 — End: 2013-07-05
  Administered 2013-06-30 – 2013-07-05 (×6): 15 [IU] via SUBCUTANEOUS
  Filled 2013-06-30 (×7): qty 0.15

## 2013-06-30 MED ORDER — SODIUM CHLORIDE 0.9 % IV SOLN
INTRAVENOUS | Status: DC
  Administered 2013-06-30 – 2013-07-01 (×2): 1000 mL via INTRAVENOUS

## 2013-06-30 NOTE — Progress Notes (Signed)
Speech Language Pathology Dysphagia Treatment Patient Details Name: [name] MRN: [id number] DOB: 06/27/1941 Today's Date: 06/30/2013 Time: 1430-1450 SLP Time Calculation (min): 20 min  Assessment / Plan / Recommendation Clinical Impression  Pt presents with baseline congested cough that is also seen throughout PO trials of dys. 3 textures and thin liquids. Pt consumed several large consecutive straw sips of thin liquids without incidence, however demonstrated an immediate cough after one small sip. Question baseline cough versus possible penetration/aspiration. Vocal quality remained clear. Pt with a moderately prolonged oral phase that husband reports is her baseline. He also shared that she is not consuming the pureed textures. SLP provided thorough education regarding the reasoning for a modified diet including the risks of aspiration. Recommend to conservatively advance her solids to dys. 2 textures with SLP f/u to assess diet tolerance and appropriateness for objective testing.    Diet Recommendation  Initiate / Change Diet: Dysphagia 2 (fine chop);Thin liquid    SLP Plan Continue with current plan of care   Pertinent Vitals/Pain N/A   Swallowing Goals  SLP Swallowing Goals Patient will consume recommended diet without observed clinical signs of aspiration with: Maximal cueing Swallow Study Goal #1 - Progress: Progressing toward goal Patient will utilize recommended strategies during swallow to increase swallowing safety with: Maximal cueing Swallow Study Goal #2 - Progress: Progressing toward goal  General Temperature Spikes Noted: No Respiratory Status: Room air Behavior/Cognition: Confused;Alert;Distractible;Requires cueing Oral Cavity - Dentition: Dentures, top;Dentures, bottom Patient Positioning: Upright in chair  Oral Cavity - Oral Hygiene Brush patient's teeth BID with toothbrush (using toothpaste with fluoride): Yes Patient is AT RISK - Oral Care Protocol followed  (see row info): Yes   Dysphagia Treatment Treatment focused on: Upgraded PO texture trials;Patient/family/caregiver education;Utilization of compensatory strategies Family/Caregiver Educated: husband Vonna Kotyk) Treatment Methods/Modalities: Skilled observation;Differential diagnosis Patient observed directly with PO's: Yes Type of PO's observed: Dysphagia 3 (soft);Thin liquids Feeding: Able to feed self Liquids provided via: Straw Oral Phase Signs & Symptoms: Prolonged mastication;Prolonged bolus formation;Prolonged oral phase Pharyngeal Phase Signs & Symptoms: Suspected delayed swallow initiation;Immediate cough;Delayed cough Type of cueing: Verbal Amount of cueing: Minimal   GO     [name] 06/30/2013, 4:23 PM  [name], M.A. CCC-SLP [phone]

## 2013-06-30 NOTE — Progress Notes (Signed)
Clinical Social Work Department CLINICAL SOCIAL WORK PLACEMENT NOTE 06/30/2013  Patient:  JALACIA, MATTILA  Account Number:  [id number] Admit date:  06/27/2013  Clinical Social Worker:  Carren Rang  Date/time:  06/30/2013 04:12 PM  Clinical Social Work is seeking post-discharge placement for this patient at the following level of care:   SKILLED NURSING   (*CSW will update this form in Epic as items are completed)     Patient/family provided with [facility] Department of Clinical Social Work's list of facilities offering this level of care within the geographic area requested by the patient (or if unable, by the patient's family).    Patient/family informed of their freedom to choose among providers that offer the needed level of care, that participate in Medicare, Medicaid or managed care program needed by the patient, have an available bed and are willing to accept the patient.  06/30/2013  Patient/family informed of [facility]' ownership interest in [facility], as well as of the fact that they are under no obligation to receive care at this facility.  PASARR submitted to EDS on Previously PASARR number received from EDS on Previously  FL2 transmitted to all facilities in geographic area requested by pt/family on  06/30/2013 FL2 transmitted to all facilities within larger geographic area on   Patient informed that his/her managed care company has contracts with or will negotiate with  certain facilities, including the following:     Patient/family informed of bed offers received:   Patient chooses bed at  Physician recommends and patient chooses bed at    Patient to be transferred to  on   Patient to be transferred to facility by   The following physician request were entered in Epic:   Additional Comments:

## 2013-06-30 NOTE — Progress Notes (Signed)
I have personally seen and examined this patient and agree with the assessment/plan as outlined above by Benjamin Stain MD (PGY2). Labs pending from this AM--- neurological status relatively unchanged. Jalicia Roszak K.,MD 06/30/2013 11:16 AM

## 2013-06-30 NOTE — Progress Notes (Signed)
ANTIBIOTIC CONSULT NOTE  Pharmacy Consult for Vancomycin Indication: UTI / pneumonia  Allergies  Allergen Reactions  . Adhesive [Tape]     Skin irritation, "pulls the skin off"   . Aspirin     Crawling feeling under skin  . Hydrocodone Other (See Comments)    Hallucinations   . Iohexol      Desc: pt unable to tell what the reaction was   . Oxycodone     hallucinations  . Penicillins Hives and Swelling    Has tolerated cefazolin and ceftriaxone.  . Tacrolimus Other (See Comments)    Hallucinations   . Contrast Media [Iodinated Diagnostic Agents] Hives, Swelling and Rash    Patient Measurements: Height: 5' 4.8" (164.6 cm) Weight: 131 lb 6.3 oz (59.6 kg) IBW/kg (Calculated) : 56.54  Vital Signs: Temp: 98.5 F (36.9 C) (09/10 2129) Temp src: Oral (09/10 2129) BP: 162/56 mmHg (09/10 2129) Pulse Rate: 77 (09/10 2129) Intake/Output from previous day: 09/09 0701 - 09/10 0700 In: 1211.7 [I.V.:1211.7] Out: -  Intake/Output from this shift: Total I/O In: 120 [P.O.:120] Out: -   Labs:  Recent Labs  06/28/13 0422 06/29/13 0540 06/30/13 1235  WBC 10.5 11.7* 9.1  HGB 10.0* 10.3* 10.4*  PLT 176 206 210  CREATININE 1.75* 1.65* 1.36*   Estimated Creatinine Clearance: 32.4 ml/min (by C-G formula based on Cr of 1.36).  Recent Labs  06/28/13 2107 06/30/13 2230  VANCOTROUGH 17.9 15.4    Assessment: 72 yo Female with recurrent UTI/PNA for vancomycin   Goal of Therapy:  Vancomycin trough level 15-20 mcg/ml  Plan:  Continue vancomycin 500 mg IV q24h  Geannie Risen, PharmD, BCPS   06/30/2013 11:47 PM

## 2013-06-30 NOTE — Progress Notes (Signed)
Clinical Social Work Department BRIEF PSYCHOSOCIAL ASSESSMENT 06/30/2013  Patient:  [name], [name]     Account Number:  [id number]     Admit date:  06/27/2013  Clinical Social Worker:  Carren Rang  Date/Time:  06/30/2013 04:07 PM  Referred by:  Physician  Date Referred:  06/30/2013 Referred for  SNF Placement   Other Referral:   Interview type:  Other - See comment Other interview type:   CSW spoke with husband at bedside, while patient was listening and nodding.    PSYCHOSOCIAL DATA Living Status:  HUSBAND Admitted from facility:   Level of care:   Primary support name:  GEARLINE SPILMAN [phone] Primary support relationship to patient:  SPOUSE Degree of support available:   Good    CURRENT CONCERNS Current Concerns  Post-Acute Placement   Other Concerns:    SOCIAL WORK ASSESSMENT / PLAN Clinical Social Worker received referral for SNF placement at d/c. CSW introduced self and explained reason for visit. Patient had visitor by bedside, husband. CSW explained SNF process to husband. Husband stated that patient was previously at [facility] and had a terrible experience there. Husband states he wants to get the offers and then go look at facilities to make sure it is okay for the patient. Husband stated he wants patient to get rehab for a little and then be able to come back home. Patient reported she is agreeable for SNF placement by nodding her head. CSW will complete FL2 for MD's signature and will update patient and family when bed offers are received.   Assessment/plan status:  Psychosocial Support/Ongoing Assessment of Needs Other assessment/ plan:   Information/referral to community resources:   SNF information    PATIENT'S/FAMILY'S RESPONSE TO PLAN OF CARE: Patient and husband agreeable to be faxed out in [facility].       Maree Krabbe, MSW, Theresia Majors [phone]

## 2013-06-30 NOTE — Progress Notes (Addendum)
[facility] Progress Note    Assessment/ Plan:   1. ESRD: Secondary to DM. 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 Below her baseline cr since admission. - Continue home cellcept 500mg  BID, prednisone 10mg  daily, and sirolimus 3mg  daily, f/u sirolimus level  - With DM, change to NS at 50cc/hr and consider d/c tomorrow if patient displaying good PO - continue Calcitriol 0.52mcg daily - f/u pending BMET this morning 2. Recurrent UTI - UA with trace leuks, so culture not sent. Abd [place] of allograft reveals no masses or hydronephrosis, With GFR<50, macrobid will NOT concentrate into urine and provide no ppx against UTI, recommend stopping.  3. AMS - worsening over 3-4 months, no sz activity seen on EEG, though consistent with encephalopathy. Neuro recommends outpatient followup for evaluation of dementia  - Will check EBV PCR, also consider PML though would need MRI to eval and neuro has deferred w/u for outpatient - swallow study reveals MODERATE aspiration risk (recommends thin liquid, dysphagia 1 diet) - ordered - Continue QHS dosing of neurontin (decreased from TID) for suspected toxicity 4. Hypokalemia - Resolved. Will discontinue KCL if potassium >4 but continue for now  - f/u pending BMET 5. HTN - Per primary. Norvasc 10 and labetalol 300mg  BID.  6. Anemia - Saturation ratio >=30 and stable. Will continue to monitor.  - f/u CBC today 7. Weight loss: Likely malnourished and cause of chronic metabolic alkalosis. Will continue to monitor.  - continue Nepro BID between meals  8. IDDM with peripheral neuopathy - Per primary.  - Reduced detimir and on SSI  - With high 200s blood sugars, will change to NS @ 50 cc/hr  - Monitor glucose  9. Pneumonia - Bilateral infiltrates on CXR.  - Currently on vanc per pharmacy and ceftriaxone for broad cvg, blood cultures NGTD pending final read. - Hydration per above, monitor intake - Monitor vitals for signs of sepsis     Subjective:   Per husband her mentation has not improved or resolved since admission. She does have a cough with audible congestion but husband denies any sputum production and states cough is intermittent. She continues to have her facial dyskinesis and hand movements which the husband states she gets when she is "sick." Husband states she otherwise appears at baseline and does not look to be complaining of any pain or difficulty breathing. Is not feeding self per baseline.   Objective:   BP 158/79  Pulse 73  Temp(Src) 98.4 F (36.9 C) (Oral)  Resp 16  Ht 5' 4.8" (1.646 m)  Wt 59.6 kg (131 lb 6.3 oz)  BMI 22 kg/m2  SpO2 95%  Intake/Output Summary (Last 24 hours) at 06/30/13 1610 Last data filed at 06/30/13 9604  Gross per 24 hour  Intake 1211.67 ml  Output      0 ml  Net 1211.67 ml   Weight change: 1 kg (2 lb 3.3 oz)  Physical Exam: General appearance - awake and eating breakfast with husband aid, weak-appearing  Eyes - sclera clear with left eye ping[place]  Mouth - moist mucus membranes, no exudate or erythema  Neck - supple, no significant adenopathy  Lymphatics - no palpable lymphadenopathy, no hepatosplenomegaly  Chest - no tachypnea, retractions or cyanosis, bilateral crackles throughout though decreased from previously Heart - normal rate and regular rhythm, no gallops noted, systolic murmur 3/6 heard best at 2nd right intercostal space but heard throughout the precordium, no JVD  Abdomen - NABS, soft, nontender, nondistended,  no masses or organomegaly, no suprapubic mass or tenderness, no tenderness over kidney transplant site (right lower quadrant)  Neuro - fine tremors bilateral hands, using left upper extremity to feed self, follows commands most of the time Musculoskeletal - no joint tenderness, deformity or swelling, no muscular tenderness noted, 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  ULTRASOUND OF RENAL TRANSPLANT  Technique: Ultrasound examination of the renal transplant was performed, with color and duplex Doppler evaluation.  Comparison:  09/27/2010  Findings:  Transplant kidney location:  Right lower quadrant  Transplant kidney description:  Normal in size and parenchymal echogenicity. No evidence of mass or hydronephrosis. Transplant measures 8.0 cm in length.  Color flow in the main renal artery at the hilum:  Present  Color flow in the main renal vein at the hilum:  Present  Resistive indices:       Main renal artery:  0.89       Upper pole segmental renal artery: 0.82       Lower pole segmental renal artery:  0.80  Bladder:  Normal appearance.  IMPRESSION: Normal appearance of the renal transplant without hydronephrosis.  The resistive indices within the transplant kidney are slightly elevated.  Recommend correlation with clinical history and comparison with any previous Doppler imaging.   Original Report Authenticated By: Richarda Overlie, M.D.    Labs: BMET  Recent Labs Lab 06/27/13 1540 06/28/13 0422 06/29/13 0540  NA 142 142 139  K 3.9 3.3* 3.8  CL 97 99 100  CO2 34* 34* 31  GLUCOSE 197* 50* 195*  BUN 33* 34* 34*  CREATININE 1.76* 1.75* 1.65*  CALCIUM 10.0 8.9 9.1   CBC  Recent Labs Lab 06/27/13 1552 06/28/13 0422 06/29/13 0540  WBC 13.0* 10.5 11.7*  NEUTROABS 11.9*  --   --   HGB 13.4 10.0* 10.3*  HCT 40.1 31.5* 32.2*  MCV 85.0 85.6 85.0  PLT 185 176 206    Medications:    . amLODipine  10 mg Oral Q breakfast  . calcitRIOL  0.5 mcg Oral Q breakfast  . cefTRIAXone (ROCEPHIN)  IV  1 g Intravenous Q24H  . enoxaparin (LOVENOX) injection  30 mg Subcutaneous Q24H  . famotidine  20 mg  Oral Daily  . feeding supplement (NEPRO CARB STEADY)  237 mL Oral BID BM  . fentaNYL  50 mcg Transdermal Q3 days  . gabapentin  300 mg Oral QHS  . insulin aspart  0-9 Units Subcutaneous TID WC  . insulin detemir  15 Units Subcutaneous Daily  . ketoconazole  1 application Topical BID  . labetalol  300 mg Oral BID  . latanoprost  1 drop Both Eyes QHS  . levothyroxine  224 mcg Oral QAC breakfast  . mycophenolate  500 mg Oral BID  . potassium chloride  20 mEq Oral Daily  . predniSONE  10 mg Oral Q breakfast  . sirolimus  3 mg Oral Daily  . vancomycin  500 mg Intravenous Q24H      Lewie Chamber, MS4 06/30/2013, 8:33 AM   I have seen and examined patient with MS4 and agree with my plan, with my additions included. Leona Singleton, MD Family  Practice PGY-2 06/30/2013 9:38 AM

## 2013-06-30 NOTE — Progress Notes (Signed)
Subjective: Mentation is still slow. Trouble feeding herself. Some cough. No pain  Objective: Vital signs in last 24 hours: Temp:  [98.1 F (36.7 C)-98.6 F (37 C)] 98.1 F (36.7 C) (09/10 0519) Pulse Rate:  [72-81] 72 (09/10 0519) Resp:  [18-20] 18 (09/10 0519) BP: (136-158)/(54-78) 158/54 mmHg (09/10 0519) SpO2:  [95 %-96 %] 95 % (09/10 0519) Weight:  [59.6 kg (131 lb 6.3 oz)] 59.6 kg (131 lb 6.3 oz) (09/10 0519)  Intake/Output from previous day: 09/09 0701 - 09/10 0700 In: 1211.7 [I.V.:1211.7] Out: -  Intake/Output this shift:    Left periorbital edema is more prominent. Lungs some left UL rhonchi. Ht regular with harsh murmur. abd soft NT. Awake. Slow to respond. Some left hand tremor  Lab Results   Recent Labs  06/28/13 0422 06/29/13 0540  WBC 10.5 11.7*  RBC 3.68* 3.79*  HGB 10.0* 10.3*  HCT 31.5* 32.2*  MCV 85.6 85.0  MCH 27.2 27.2  RDW 17.1* 17.0*  PLT 176 206    Recent Labs  06/28/13 0422 06/29/13 0540  NA 142 139  K 3.3* 3.8  CL 99 100  CO2 34* 31  GLUCOSE 50* 195*  BUN 34* 34*  CREATININE 1.75* 1.65*  CALCIUM 8.9 9.1  Results for KASIYA, BURCK (MRN [id number]) as of 06/30/2013 07:44  Ref. Range 06/28/2013 04:22  TSH Latest Range: 0.350-4.500 uIU/mL 0.473    Studies/Results: US Renal Transplant W/doppler  06/29/2013   *RADIOLOGY REPORT*  Clinical Data:  Evaluate renal transplant.  ULTRASOUND OF RENAL TRANSPLANT  Technique: Ultrasound examination of the renal transplant was performed, with color and duplex Doppler evaluation.  Comparison:  09/27/2010  Findings:  Transplant kidney location:  Right lower quadrant  Transplant kidney description:  Normal in size and parenchymal echogenicity. No evidence of mass or hydronephrosis. Transplant measures 8.0 cm in length.  Color flow in the main renal artery at the hilum:  Present  Color flow in the main renal vein at the hilum:  Present  Resistive indices:       Main renal artery:  0.89       Upper pole  segmental renal artery: 0.82       Lower pole segmental renal artery:  0.80  Bladder:  Normal appearance.  IMPRESSION: Normal appearance of the renal transplant without hydronephrosis.  The resistive indices within the transplant kidney are slightly elevated.  Recommend correlation with clinical history and comparison with any previous Doppler imaging.   Original Report Authenticated By: Richarda Overlie, M.D.    Scheduled Meds: . amLODipine  10 mg Oral Q breakfast  . calcitRIOL  0.5 mcg Oral Q breakfast  . cefTRIAXone (ROCEPHIN)  IV  1 g Intravenous Q24H  . enoxaparin (LOVENOX) injection  30 mg Subcutaneous Q24H  . famotidine  20 mg Oral Daily  . feeding supplement (NEPRO CARB STEADY)  237 mL Oral BID BM  . fentaNYL  50 mcg Transdermal Q3 days  . gabapentin  300 mg Oral QHS  . insulin aspart  0-9 Units Subcutaneous TID WC  . insulin detemir  10 Units Subcutaneous Daily  . ketoconazole  1 application Topical BID  . labetalol  300 mg Oral BID  . latanoprost  1 drop Both Eyes QHS  . levothyroxine  224 mcg Oral QAC breakfast  . mycophenolate  500 mg Oral BID  . potassium chloride  20 mEq Oral Daily  . predniSONE  10 mg Oral Q breakfast  . sirolimus  3 mg Oral Daily  .  vancomycin  500 mg Intravenous Q24H   Continuous Infusions: . dextrose 5 % and 0.9% NaCl 50 mL/hr at 06/29/13 1116   PRN Meds:acetaminophen, acetaminophen  Assessment/Plan:  PNEUMONIA: still congested. Sats OK  Altered mental status- EEG and CT OK. Still mentating slowly History of recurrent UTI: no new data  Diabetes mellitus with microvascular complications:   Hypothyroidism-continue home medication regimen thyroid level is fine Hypertension-Improved  GERD-continue H2-blocker  Chronic kidney disease status post renal transplant now with stage III chronic kidney disease followed by nephrology: [place] looks fine. Cr better at 1.65  Underlying dementia : was to see neuro as an outpt.  Gait abnormality with history of back  surgery, ruptured disc, on chronic pain medications, using a walker at baseline, no recent falls per husband report  Anemia: has dropped with hydration but now stable  Hypokalemia: resolved  PROTEIN CALORIE MALNUTRITION/ADULT FTT: Alb low at 2.8 Dysphagia: now on D1 diet DISP: may need SNF    LOS: 3 days   [name] 06/30/2013, 7:42 AM

## 2013-07-01 ENCOUNTER — Inpatient Hospital Stay (HOSPITAL_COMMUNITY): Payer: Medicare Other

## 2013-07-01 LAB — CBC
MCH: 27.1 pg (ref 26.0–34.0)
MCHC: 31.9 g/dL (ref 30.0–36.0)
RDW: 16.7 % — ABNORMAL HIGH (ref 11.5–15.5)

## 2013-07-01 LAB — BASIC METABOLIC PANEL
BUN: 26 mg/dL — ABNORMAL HIGH (ref 6–23)
Calcium: 9.1 mg/dL (ref 8.4–10.5)
GFR calc Af Amer: 47 mL/min — ABNORMAL LOW (ref >90)
GFR calc non Af Amer: 41 mL/min — ABNORMAL LOW (ref >90)
Potassium: 3.7 mEq/L (ref 3.5–5.1)
Sodium: 140 mEq/L (ref 135–145)

## 2013-07-01 LAB — GLUCOSE, CAPILLARY: Glucose-Capillary: 165 mg/dL — ABNORMAL HIGH (ref 70–99)

## 2013-07-01 LAB — EPSTEIN BARR VRS(EBV DNA BY PCR): EBV DNA QN by PCR: <500 copies/mL (ref <500)

## 2013-07-01 NOTE — Progress Notes (Signed)
Speech Language Pathology Dysphagia Treatment Patient Details Name: [name] MRN: [id number] DOB: Sep 16, 1941 Today's Date: 07/01/2013 Time: 2440-1027 SLP Time Calculation (min): 19 min  Assessment / Plan / Recommendation Clinical Impression  Pt observed with PO trials of Dys. 1 and Dys. 3 textures as well as thin and nectar thick liquids. Pt with a baseline congested cough that is observed throughout all consistencies tested, and intermittently occurs immediately following PO trials. Given the above as well as her right-sided PNA and return of low-grade fever, recommend to proceed with objective testing.     Diet Recommendation  Continue with Current Diet: Dysphagia 2 (fine chop);Thin liquid    SLP Plan MBS   Pertinent Vitals/Pain N/A   Swallowing Goals  SLP Swallowing Goals Patient will consume recommended diet without observed clinical signs of aspiration with: Maximal cueing Swallow Study Goal #1 - Progress: Progressing toward goal Patient will utilize recommended strategies during swallow to increase swallowing safety with: Maximal cueing Swallow Study Goal #2 - Progress: Progressing toward goal  General Temperature Spikes Noted: Yes (low grade) Respiratory Status: Room air Behavior/Cognition: Confused;Alert;Distractible;Requires cueing Oral Cavity - Dentition: Dentures, top;Dentures, bottom Patient Positioning: Upright in bed  Oral Cavity - Oral Hygiene Does patient have any of the following "at risk" factors?: Nutritional status - dependent feeder Brush patient's teeth BID with toothbrush (using toothpaste with fluoride): Yes Patient is AT RISK - Oral Care Protocol followed (see row info): Yes   Dysphagia Treatment Treatment focused on: Upgraded PO texture trials;Patient/family/caregiver education;Utilization of compensatory strategies Family/Caregiver Educated: husband Vonna Kotyk) Treatment Methods/Modalities: Skilled observation;Differential diagnosis Patient observed  directly with PO's: Yes Type of PO's observed: Dysphagia 3 (soft);Dysphagia 1 (puree);Thin liquids;Nectar-thick liquids Feeding: Needs assist Liquids provided via: Straw Oral Phase Signs & Symptoms: Prolonged mastication;Prolonged bolus formation;Prolonged oral phase Pharyngeal Phase Signs & Symptoms: Suspected delayed swallow initiation;Immediate cough;Delayed cough Type of cueing: Verbal Amount of cueing: Minimal   GO     [name] 07/01/2013, 9:23 AM  [name], M.A. CCC-SLP [phone]

## 2013-07-01 NOTE — Progress Notes (Signed)
Subjective: Not tolerating diet well. Wants to have regular consistency. Some cough. No pain  Objective: Vital signs in last 24 hours: Temp:  [98.5 F (36.9 C)-99.1 F (37.3 C)] 99 F (37.2 C) (09/11 0731) Pulse Rate:  [74-78] 74 (09/11 0731) Resp:  [16-18] 16 (09/11 0731) BP: (130-173)/(56-70) 173/70 mmHg (09/11 0731) SpO2:  [95 %-97 %] 96 % (09/11 0731) Weight:  [62.8 kg (138 lb 7.2 oz)] 62.8 kg (138 lb 7.2 oz) (09/11 0523)  Intake/Output from previous day: 09/10 0701 - 09/11 0700 In: 1704.2 [P.O.:1080; I.V.:624.2] Out: 150 [Urine:150] Intake/Output this shift: Total I/O In: 120 [P.O.:120] Out: -   More alert. Some eye swelling some wheeze, a bit better mentally  Lab Results   Recent Labs  06/30/13 1235 07/01/13 0650  WBC 9.1 6.3  RBC 3.91 3.76*  HGB 10.4* 10.2*  HCT 33.6* 32.0*  MCV 85.9 85.1  MCH 26.6 27.1  RDW 17.0* 16.7*  PLT 210 249    Recent Labs  06/30/13 1235 07/01/13 0650  NA 137 140  K 3.8 3.7  CL 100 103  CO2 27 28  GLUCOSE 329* 171*  BUN 28* 26*  CREATININE 1.36* 1.27*  CALCIUM 9.0 9.1    Studies/Results: US Renal Transplant W/doppler  06/29/2013   *RADIOLOGY REPORT*  Clinical Data:  Evaluate renal transplant.  ULTRASOUND OF RENAL TRANSPLANT  Technique: Ultrasound examination of the renal transplant was performed, with color and duplex Doppler evaluation.  Comparison:  09/27/2010  Findings:  Transplant kidney location:  Right lower quadrant  Transplant kidney description:  Normal in size and parenchymal echogenicity. No evidence of mass or hydronephrosis. Transplant measures 8.0 cm in length.  Color flow in the main renal artery at the hilum:  Present  Color flow in the main renal vein at the hilum:  Present  Resistive indices:       Main renal artery:  0.89       Upper pole segmental renal artery: 0.82       Lower pole segmental renal artery:  0.80  Bladder:  Normal appearance.  IMPRESSION: Normal appearance of the renal transplant without  hydronephrosis.  The resistive indices within the transplant kidney are slightly elevated.  Recommend correlation with clinical history and comparison with any previous Doppler imaging.   Original Report Authenticated By: Richarda Overlie, M.D.    Scheduled Meds: . amLODipine  10 mg Oral Q breakfast  . calcitRIOL  0.5 mcg Oral Q breakfast  . cefTRIAXone (ROCEPHIN)  IV  1 g Intravenous Q24H  . enoxaparin (LOVENOX) injection  30 mg Subcutaneous Q24H  . famotidine  20 mg Oral Daily  . feeding supplement (NEPRO CARB STEADY)  237 mL Oral BID BM  . fentaNYL  50 mcg Transdermal Q3 days  . gabapentin  300 mg Oral QHS  . insulin aspart  0-9 Units Subcutaneous TID WC  . insulin detemir  15 Units Subcutaneous Daily  . ketoconazole  1 application Topical BID  . labetalol  300 mg Oral BID  . latanoprost  1 drop Both Eyes QHS  . levothyroxine  224 mcg Oral QAC breakfast  . mycophenolate  500 mg Oral BID  . potassium chloride  20 mEq Oral Daily  . predniSONE  10 mg Oral Q breakfast  . sirolimus  3 mg Oral Daily  . vancomycin  500 mg Intravenous Q24H   Continuous Infusions: . sodium chloride 1,000 mL (06/30/13 1243)   PRN Meds:acetaminophen, acetaminophen  Assessment/Plan:  PNEUMONIA: recheck CXR Altered mental  status- EEG and CT OK. Still mentating slowly  History of recurrent UTI: no new data  Diabetes mellitus with microvascular complications:  Hypothyroidism-continue home medication regimen thyroid level is fine  Hypertension-Improved  GERD-continue H2-blocker  Chronic kidney disease status post renal transplant now with stage III chronic kidney disease followed by nephrology: [place] looks fine. Cr better at 1.27 Underlying dementia : was to see neuro as an outpt.  Gait abnormality with history of back surgery, ruptured disc, on chronic pain medications, using a walker at baseline, no recent falls per husband report  Anemia: has dropped with hydration but now stable  Hypokalemia: resolved  PROTEIN  CALORIE MALNUTRITION/ADULT FTT: Alb low at 2.8  Dysphagia: now on D2  diet  DISP: may need SNF    LOS: 4 days   Aharon Carriere [name] 07/01/2013, 8:48 AM

## 2013-07-01 NOTE — Clinical Social Work Placement (Unsigned)
     Clinical Social Work Department CLINICAL SOCIAL WORK PLACEMENT NOTE 07/01/2013  Patient:  [name], [name]  Account Number:  [id number] Admit date:  06/27/2013  Clinical Social Worker:  Carren Rang  Date/time:  06/30/2013 04:12 PM  Clinical Social Work is seeking post-discharge placement for this patient at the following level of care:   SKILLED NURSING   (*CSW will update this form in Epic as items are completed)     Patient/family provided with [facility] Department of Clinical Social Works list of facilities offering this level of care within the geographic area requested by the patient (or if unable, by the patients family).    Patient/family informed of their freedom to choose among providers that offer the needed level of care, that participate in Medicare, Medicaid or managed care program needed by the patient, have an available bed and are willing to accept the patient.  06/30/2013  Patient/family informed of [facility] ownership interest in [facility], as well as of the fact that they are under no obligation to receive care at this facility.  PASARR submitted to EDS on 06/30/2013 PASARR number received from EDS on 06/30/2013  FL2 transmitted to all facilities in geographic area requested by pt/family on  06/30/2013 FL2 transmitted to all facilities within larger geographic area on   Patient informed that his/her managed care company has contracts with or will negotiate with  certain facilities, including the following:     Patient/family informed of bed offers received:  07/01/2013 Patient chooses bed at  Physician recommends and patient chooses bed at    Patient to be transferred to  on   Patient to be transferred to facility by   The following physician request were entered in Epic:   Additional Comments:

## 2013-07-01 NOTE — Progress Notes (Signed)
Inpatient Diabetes Program Recommendations  AACE/ADA: New Consensus Statement on Inpatient Glycemic Control (2013)  Target Ranges:  Prepandial:   less than 140 mg/dL      Peak postprandial:   less than 180 mg/dL (1-2 hours)      Critically ill patients:  140 - 180 mg/dL   Results for [name], [name] (MRN [id number]) as of 07/01/2013 14:02  Ref. Range 06/29/2013 07:33 06/29/2013 12:20 06/29/2013 17:22 06/29/2013 22:23  Glucose-Capillary Latest Range: 70-99 mg/dL 119 (H) 147 (H) 829 (H) 284 (H)   Results for [name], [name] (MRN [id number]) as of 07/01/2013 14:02  Ref. Range 06/30/2013 07:53 06/30/2013 12:21 06/30/2013 17:42 06/30/2013 21:31 07/01/2013 07:36 07/01/2013 11:56  Glucose-Capillary Latest Range: 70-99 mg/dL 784 (H) 696 (H) 295 (H) 241 (H) 165 (H) 234 (H)   Inpatient Diabetes Program Recommendations Correction (SSI): Please consider adding Novolog bedtime correction scale. Insulin - Meal Coverage: May want to consider ordering Novolog 3 units TID with meals for meal coverage while patient is receiving steroids.  Thanks, Orlando Penner, RN, MSN, CCRN Diabetes Coordinator Inpatient Diabetes Program [phone] (Team Pager) [phone] ([facility] office) [phone] [facility] office)

## 2013-07-01 NOTE — Procedures (Signed)
Objective Swallowing Evaluation: Modified Barium Swallowing Study  Patient Details  Name: [name] MRN: [id number] Date of Birth: 1941/08/30  Today's Date: 07/01/2013 Time: 1300-1330 SLP Time Calculation (min): 30 min  Past Medical History:  Past Medical History  Diagnosis Date  . Diabetes mellitus   . Diabetic neuropathy   . Hypothyroidism   . Hypertension   . GERD (gastroesophageal reflux disease)   . Heart murmur     not treated for  . Arthritis   . Chronic kidney disease     RENAL TRANSPLANT IN 2008--PT IS FOLLOWED BY DR. Karie Fetch WITH BUN OF 37 AND CREAT 1.7--PER NOTE DR. NESI FROM 04/11/12  . Urethral diverticulum   . Eyesight diminished     GLAUCOMA   . History of shingles     LEFT EYE JAN 2012--STILL HAS EYE PAIN-STATES NOT ABLE TO TAKE THE MEDICATION FOR SHINGLES BECAUSE OF HER HX OF KIDNEY TRANSPLANT  . Fingernail abnormalities     FUNGUS OF FINGERNAILS  . UTI (urinary tract infection) 05/18/2013   Past Surgical History:  Past Surgical History  Procedure Laterality Date  . Kidney transplant  2008    At [facility]  . Back surgery      ruptured disk  . Abdominal hysterectomy    . Appendectomy    . Laser surgery of both eyes for hemorrhages    . 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 Urethral diverticulectomy  04/28/2012    Procedure: URETHRAL DIVERTICULECTOMY;  Surgeon: Antony Haste, MD;  Location: [facility];  Service: Urology;  Laterality: N/A;   HPI:  Pt is a 72 year old female with chronic kidney disease status post renal transplant in 2008, receiving temporary dialysis through a catheter in her left upper extremity, currently on immunosuppressive therapy and multiple hospitalizations for recurrent UTI associated with altered mental status. At baseline she does live at home with her husband, able to transport herself with a walker, complicated by some low back discomfort with history of herniated disc and surgery. She does have some baseline  dementia and was scheduled to be evaluated by neurology this coming week. She has had multiple enterococcal UTIs, treated with a variety of antibiotic regimens, with some cultures growing out multiple pathogens. Recently she has been treated by nephrology with Macrobid per patient husband report, as soon as 7-10 days ago. However over the last one week, patient has developed worsening problems with mental status changes, unable to mobilize her needs and wants, now not ambulating, with nausea and anorexia. Pt had chest X-ray on 06/28/13 which showed patchy right sided pulmonary infiltrates and possible component of left upper lobe pneumonia as well. BSE today to assess swallowing function.      Assessment / Plan / Recommendation Clinical Impression  Dysphagia Diagnosis: Within Functional Limits Clinical impression: Pt presents with oropharyngeal swallowing function that is grossly WFL. Although her oral phase is mildly prolonged with mild oral residue with solids, residuals are reflexively cleared with a second swallow. Pharyngeal phase initiates timely at the BOT and is effectively passed through the pharynx with no residuals. Recommend to resume regular textures and thin liquids. SLP to f/u briefly to ensure tolerance with meals and provide education.    Treatment Recommendation  Therapy as outlined in treatment plan below    Diet Recommendation Regular;Thin liquid   Liquid Administration via: Cup;Straw;No straw Medication Administration: Whole meds with liquid Supervision: Patient able to self feed;Full supervision/cueing for compensatory strategies (okay with husband) Compensations: Slow rate;Small  sips/bites Postural Changes and/or Swallow Maneuvers: Seated upright 90 degrees;Upright 30-60 min after meal    Other  Recommendations Oral Care Recommendations: Oral care BID   Follow Up Recommendations  24 hour supervision/assistance;Home health SLP    Frequency and Duration min 2x/week  2  weeks   Pertinent Vitals/Pain N/A    SLP Swallow Goals Patient will consume recommended diet without observed clinical signs of aspiration with: Maximal cueing Swallow Study Goal #1 - Progress: Progressing toward goal Patient will utilize recommended strategies during swallow to increase swallowing safety with: Maximal cueing Swallow Study Goal #2 - Progress: Progressing toward goal   General Date of Onset: 06/29/13 HPI: Pt is a 72 year old female with chronic kidney disease status post renal transplant in 2008, receiving temporary dialysis through a catheter in her left upper extremity, currently on immunosuppressive therapy and multiple hospitalizations for recurrent UTI associated with altered mental status. At baseline she does live at home with her husband, able to transport herself with a walker, complicated by some low back discomfort with history of herniated disc and surgery. She does have some baseline dementia and was scheduled to be evaluated by neurology this coming week. She has had multiple enterococcal UTIs, treated with a variety of antibiotic regimens, with some cultures growing out multiple pathogens. Recently she has been treated by nephrology with Macrobid per patient husband report, as soon as 7-10 days ago. However over the last one week, patient has developed worsening problems with mental status changes, unable to mobilize her needs and wants, now not ambulating, with nausea and anorexia. Pt had chest X-ray on 06/28/13 which showed patchy right sided pulmonary infiltrates and possible component of left upper lobe pneumonia as well. BSE today to assess swallowing function.  Type of Study: Modified Barium Swallowing Study Reason for Referral: Objectively evaluate swallowing function Previous Swallow Assessment: N/A Diet Prior to this Study: Dysphagia 1 (puree);Thin liquids Temperature Spikes Noted: Yes (low grade) Respiratory Status: Room air History of Recent Intubation:  No Behavior/Cognition: Confused;Alert;Distractible;Requires cueing Oral Cavity - Dentition: Dentures, top;Dentures, bottom Oral Motor / Sensory Function: Within functional limits Self-Feeding Abilities: Able to feed self;Needs assist Patient Positioning: Upright in chair Baseline Vocal Quality: Clear Anatomy: Within functional limits Pharyngeal Secretions: Not observed secondary MBS    Reason for Referral Objectively evaluate swallowing function   Oral Phase Oral Preparation/Oral Phase Oral Phase: Impaired Oral - Thin Oral - Thin Straw: Holding of bolus Oral - Solids Oral - Puree: Weak lingual manipulation;Lingual/palatal residue;Delayed oral transit;Other (Comment) (reflexively cleared with second swallow) Oral - Mechanical Soft: Impaired mastication;Weak lingual manipulation;Lingual/palatal residue;Delayed oral transit;Other (Comment) (reflexively cleared with second swallow) Oral - Pill: Within functional limits   Pharyngeal Phase Pharyngeal Phase Pharyngeal Phase: Within functional limits  Cervical Esophageal Phase    GO    Cervical Esophageal Phase Cervical Esophageal Phase: [facility]         [name] 07/01/2013, 1:59 PM  [name], M.A. CCC-SLP [phone]

## 2013-07-01 NOTE — Clinical Social Work Placement (Signed)
Clinical Social Work Department CLINICAL SOCIAL WORK PLACEMENT NOTE 07/01/2013  Patient:  PAULLETTE, MCKAIN  Account Number:  [id number] Admit date:  06/27/2013  Clinical Social Worker:  Carren Rang  Date/time:  06/30/2013 04:12 PM  Clinical Social Work is seeking post-discharge placement for this patient at the following level of care:   SKILLED NURSING   (*CSW will update this form in Epic as items are completed)     Patient/family provided with [facility] Department of Clinical Social Work's list of facilities offering this level of care within the geographic area requested by the patient (or if unable, by the patient's family).    Patient/family informed of their freedom to choose among providers that offer the needed level of care, that participate in Medicare, Medicaid or managed care program needed by the patient, have an available bed and are willing to accept the patient.  06/30/2013  Patient/family informed of [facility]' ownership interest in [facility], as well as of the fact that they are under no obligation to receive care at this facility.  PASARR submitted to EDS on 06/30/2013 PASARR number received from EDS on 06/30/2013  FL2 transmitted to all facilities in geographic area requested by pt/family on  06/30/2013 FL2 transmitted to all facilities within larger geographic area on   Patient informed that his/her managed care company has contracts with or will negotiate with  certain facilities, including the following:     Patient/family informed of bed offers received:  07/01/2013 Patient chooses bed at  Physician recommends and patient chooses bed at    Patient to be transferred to  on   Patient to be transferred to facility by   The following physician request were entered in Epic:   Additional Comments:   Roddie Mc, Bryon Lions, [phone]

## 2013-07-01 NOTE — Progress Notes (Signed)
I have personally seen and examined this patient and agree with the assessment/plan as outlined above by Benjamin Stain MD (PGY2). Neurological status quo. Will DC IVFs today and allow for PO intake Brexlee Heberlein K.,MD 07/01/2013 11:18 AM

## 2013-07-01 NOTE — Progress Notes (Signed)
[facility] Progress Note    Assessment/ Plan:   1. ESRD: Secondary to DM. 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 Below her baseline cr since admission. 1.27 today. - Continue home cellcept 500mg  BID, prednisone 10mg  daily, and sirolimus 3mg  daily, f/u sirolimus level  - Patient with stable PO of 50% of meals; d/c IV fluids today and monitor - continue Calcitriol 0.14mcg daily  2. Recurrent UTI - UA with trace leuks, so culture not sent. Abd [place] of allograft reveals no masses or hydronephrosis, With GFR<50, macrobid will NOT concentrate into urine and provide no ppx against UTI, recommend stopping.   3. AMS - worsening over 3-4 months, no sz activity seen on EEG, though consistent with encephalopathy. Neuro recommends outpatient followup for evaluation of dementia  - EBV PCR pending; also consider PML though would need MRI to eval and neuro has deferred w/u for outpatient - swallow study with moderate aspiration risk, SLP recommending diet dysphagia 2 today for pt preference over dys I diet - ordered - Continue QHS dosing of neurontin (decreased from TID) for suspected toxicity - SW helping husband choose short-term SNF placement - f/u SW recs  4. Hypokalemia - Resolved. Will discontinue KCL if potassium >4 but continue for now. 3.7 today  5. HTN - Per primary. Norvasc 10 and labetalol 300mg  BID.  - BP elevated to 170s systolic today; will d/c IV fluids and monitor  6. Anemia - Saturation ratio >=30 and stable. Will continue to monitor. Hgb stable  7. Weight loss: Likely malnourished and cause of chronic metabolic alkalosis. Will continue to monitor.  - continue Nepro BID between meals   8. IDDM with peripheral neuopathy - Per primary.  - Reduced detimir and on SSI , glucose improving, 165 this morning - With reportedly good PO (50%), will KVO and monitor intake and hydration status  9. Pneumonia - Bilateral infiltrates on CXR. Afebrile with  normalized WBC count - Currently on vanc per pharmacy and ceftriaxone for broad cvg, blood cultures NGTD pending final read. - Hydration per above, monitor intake - Monitor vitals for signs of sepsis    Subjective:   Patient initially states "I am doing fine" upon questioning, but then does not respond to further questioning. Husband thinks her mentation is still not improved, but does not think she is having any pain, shortness of breath, or complaints other than not liking the diet dys 1. Eats 50% of each meal with his aid. States her incontinence has been going on for a few months and is due to not being able to wait until she is taken to bathroom/pan.   Objective:   BP 143/54  Pulse 79  Temp(Src) 98.7 F (37.1 C) (Oral)  Resp 16  Ht 5' 4.8" (1.646 m)  Wt 138 lb 7.2 oz (62.8 kg)  BMI 23.18 kg/m2  SpO2 97%  Intake/Output Summary (Last 24 hours) at 07/01/13 1035 Last data filed at 07/01/13 1017  Gross per 24 hour  Intake 1524.17 ml  Output    420 ml  Net 1104.17 ml   Weight change: 7 lb 0.9 oz (3.2 kg)  Physical Exam: General appearance - awake, alert, interactive with gaze but not speaking much Eyes - sclera clear with left eye pingecula  Mouth - moist mucus membranes, no exudate or erythema  Neck - supple, no significant adenopathy  Lymphatics - no hepatosplenomegaly  Chest - Normal effort; faint bilateral crackles in lower lobes, much decreased from previous days Heart -  normal rate and regular rhythm, no gallops noted, systolic murmur 2/6 heard best at 2nd right intercostal space but heard throughout the precordium, no JVD  Abdomen - NABS, soft, nontender, nondistended, no masses or organomegaly,  no tenderness over kidney transplant site (right lower quadrant)  Neuro - fine tremors bilateral hands, occasional puckering of lips, follows commands most of the time, though not responding to orientation questions (husband thinks this was volitional today as she was getting  annoyed) Musculoskeletal - no joint tenderness, deformity or swelling, no muscular tenderness noted, 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  ULTRASOUND OF RENAL TRANSPLANT  Technique: Ultrasound examination of the renal transplant was performed, with color and duplex Doppler evaluation.  Comparison:  09/27/2010  Findings:  Transplant kidney location:  Right lower quadrant  Transplant kidney description:  Normal in size and parenchymal echogenicity. No evidence of mass or hydronephrosis. Transplant measures 8.0 cm in length.  Color flow in the main renal artery at the hilum:  Present  Color flow in the main renal vein at the hilum:  Present  Resistive indices:       Main renal artery:  0.89       Upper pole segmental renal artery: 0.82       Lower pole segmental renal artery:  0.80  Bladder:  Normal appearance.  IMPRESSION: Normal appearance of the renal transplant without hydronephrosis.  The resistive indices within the transplant kidney are slightly elevated.  Recommend correlation with clinical history and comparison with any previous Doppler imaging.   Original Report Authenticated By: Richarda Overlie, M.D.    Labs: BMET  Recent Labs Lab 06/27/13 1540 06/28/13 0422 06/29/13 0540 06/30/13 1235 07/01/13 0650  NA 142 142 139 137 140  K 3.9 3.3* 3.8 3.8 3.7  CL 97 99 100 100 103  CO2 34* 34* 31 27 28   GLUCOSE 197* 50* 195* 329* 171*  BUN 33* 34* 34* 28* 26*  CREATININE 1.76* 1.75* 1.65* 1.36* 1.27*  CALCIUM 10.0 8.9 9.1 9.0 9.1   CBC  Recent Labs Lab 06/27/13 1552 06/28/13 0422 06/29/13 0540 06/30/13 1235 07/01/13 0650  WBC 13.0* 10.5 11.7* 9.1 6.3  NEUTROABS 11.9*  --   --   --   --   HGB 13.4  10.0* 10.3* 10.4* 10.2*  HCT 40.1 31.5* 32.2* 33.6* 32.0*  MCV 85.0 85.6 85.0 85.9 85.1  PLT 185 176 206 210 249    Medications:    . amLODipine  10 mg Oral Q breakfast  . calcitRIOL  0.5 mcg Oral Q breakfast  . cefTRIAXone (ROCEPHIN)  IV  1 g Intravenous Q24H  . enoxaparin (LOVENOX) injection  30 mg Subcutaneous Q24H  . famotidine  20 mg Oral Daily  . feeding supplement (NEPRO CARB STEADY)  237 mL Oral BID BM  . fentaNYL  50 mcg Transdermal Q3 days  . gabapentin  300 mg Oral QHS  . insulin aspart  0-9 Units Subcutaneous TID WC  . insulin detemir  15 Units Subcutaneous Daily  . ketoconazole  1 application Topical BID  . labetalol  300 mg Oral BID  . latanoprost  1 drop Both Eyes QHS  . levothyroxine  224 mcg Oral QAC breakfast  . mycophenolate  500 mg Oral BID  . potassium chloride  20 mEq Oral Daily  . predniSONE  10 mg Oral Q breakfast  . sirolimus  3 mg Oral Daily  . vancomycin  500 mg Intravenous Q24H      Lewie Chamber, MS4 07/01/2013, 10:35 AM   I have seen and examined patient with MS4 and agree with my plan, with my additions included. Leona Singleton, MD Family Practice PGY-2 07/01/2013 10:35 AM

## 2013-07-01 NOTE — Evaluation (Signed)
Physical Therapy Evaluation Patient Details Name: [name] MRN: [id number] DOB: [birth year] Today's Date: 07/01/2013 Time: 4782-9562 PT Time Calculation (min): 15 min  PT Assessment / Plan / Recommendation History of Present Illness   72 year old female with chronic kidney disease status post renal transplant in 2008, receiving temporary dialysis through a catheter in her left upper extremity, currently on immunosuppressive therapy and multiple hospitalizations for recurrent UTI associated with altered mental status. At baseline she does live at home with her husband, able to transport himself with a walker, complicated by some low back discomfort with history of herniated disc and surgery. She does have some baseline dementia and was scheduled to be evaluated by neurology this coming week. She has had multiple enterococcal UTIs, treated with a variety of antibiotic regimens, with some cultures growing out multiple pathogens. Recently she has been treated by nephrology with Macrobid per patient husband report, as soon as 7-10 days ago. However over the last one week, patient has developed worsening problems with mental status changes, unable to mobilize her needs and wants, now not ambulating, with nausea and anorexia.   Clinical Impression  Pt admitted with UTI and AMS. Pt currently with functional limitations due to the deficits listed below (see PT Problem List). Pt will benefit from skilled PT to increase their independence and safety with mobility to allow discharge to the venue listed below.  Spouse reports pt usually household ambulator with RW and w/c mobility for community however he has been using pivot to w/c more recently in home due to decline in pt mobility status.  Spouse states pt has required increased care over the last month with UTIs and mental status changes so he is agreeable to SNF upon d/c.  Spouse also reports pt with previous back surgery 7 years ago which has been bothering pt  as well as pt c/o bilateral knee pain.     PT Assessment  Patient needs continued PT services    Follow Up Recommendations  SNF    Does the patient have the potential to tolerate intense rehabilitation      Barriers to Discharge        Equipment Recommendations  None recommended by PT    Recommendations for Other Services     Frequency Min 2X/week    Precautions / Restrictions Precautions Precautions: Fall   Pertinent Vitals/Pain Pt c/o back and bilateral knee pain during movement (not rated), repositioned pt to comfort      Mobility  Bed Mobility Bed Mobility: Supine to Sit;Sit to Supine;Scooting to HOB Supine to Sit: 2: Max assist;HOB elevated Sit to Supine: 2: Max assist Scooting to [facility]: 1: +2 Total assist (spouse assisted with bed pad) Scooting to [facility]: Patient Percentage: 0% Details for Bed Mobility Assistance: verbal and tactile cues for assist however pt with poor initiation and required increased assist Transfers Transfers: Not assessed Details for Transfer Assistance: per spouse pt has not been up in at least a week, did not perform due to safety (would need +2)    Exercises     PT Diagnosis: Difficulty walking;Generalized weakness  PT Problem List: Decreased strength;Decreased activity tolerance;Decreased mobility;Decreased balance;Decreased safety awareness;Decreased knowledge of use of DME PT Treatment Interventions:       PT Goals(Current goals can be found in the care plan section) Acute Rehab PT Goals PT Goal Formulation: With patient Time For Goal Achievement: 07/15/13 Potential to Achieve Goals: Fair  Visit Information  Last PT Received On: 07/01/13 Assistance Needed: +2 History  of Present Illness:  72 year old female with chronic kidney disease status post renal transplant in 2008, receiving temporary dialysis through a catheter in her left upper extremity, currently on immunosuppressive therapy and multiple hospitalizations for recurrent UTI  associated with altered mental status. At baseline she does live at home with her husband, able to transport himself with a walker, complicated by some low back discomfort with history of herniated disc and surgery. She does have some baseline dementia and was scheduled to be evaluated by neurology this coming week. She has had multiple enterococcal UTIs, treated with a variety of antibiotic regimens, with some cultures growing out multiple pathogens. Recently she has been treated by nephrology with Macrobid per patient husband report, as soon as 7-10 days ago. However over the last one week, patient has developed worsening problems with mental status changes, unable to mobilize her needs and wants, now not ambulating, with nausea and anorexia.        Prior Functioning  Home Living Family/patient expects to be discharged to:: Skilled nursing facility Living Arrangements: Spouse/significant other Type of Home: House Home Equipment: Dan Humphreys - 4 wheels;Bedside commode;Grab bars - tub/shower;Wheelchair - power Additional Comments: used the walker in the house and the powered wheelchair out in the community. Prior Function Level of Independence: Needs assistance Comments: pt could walk to the bathroom with the walker without help when she was doing good, over last month pt has needed more assist for mobility and ADLs per spouse Communication Communication: [facility]    Cognition  Cognition Arousal/Alertness: Awake/alert Behavior During Therapy: WFL for tasks assessed/performed Overall Cognitive Status: History of cognitive impairments - at baseline    Extremity/Trunk Assessment Upper Extremity Assessment Upper Extremity Assessment: Generalized weakness Lower Extremity Assessment Lower Extremity Assessment: Generalized weakness (spouse reports pt with hx of knee and back pain)   Balance Balance Balance Assessed: Yes Static Sitting Balance Static Sitting - Balance Support: Feet supported;No upper  extremity supported Static Sitting - Level of Assistance: 4: Min assist Static Sitting - Comment/# of Minutes: min/guard sitting EOB 4-5 minutes  End of Session PT - End of Session Activity Tolerance: Patient limited by fatigue Patient left: in bed;with call bell/phone within reach;with nursing/sitter in room (nsg student in taking vitals)  GP     Gillian Kluever,KATHrine E 07/01/2013, 4:11 PM Zenovia Jarred, PT, DPT 07/01/2013 Pager: [phone]

## 2013-07-02 LAB — CBC
HCT: 33.4 % — ABNORMAL LOW (ref 36.0–46.0)
MCHC: 32.3 g/dL (ref 30.0–36.0)
MCV: 84.3 fL (ref 78.0–100.0)
RDW: 16.8 % — ABNORMAL HIGH (ref 11.5–15.5)

## 2013-07-02 LAB — BASIC METABOLIC PANEL
BUN: 23 mg/dL (ref 6–23)
Chloride: 107 mEq/L (ref 96–112)
Creatinine, Ser: 1.35 mg/dL — ABNORMAL HIGH (ref 0.50–1.10)
GFR calc Af Amer: 44 mL/min — ABNORMAL LOW (ref >90)

## 2013-07-02 LAB — GLUCOSE, CAPILLARY: Glucose-Capillary: 183 mg/dL — ABNORMAL HIGH (ref 70–99)

## 2013-07-02 LAB — SIROLIMUS LEVEL: Sirolimus (Rapamycin): 7.5 mcg/L (ref 3.0–18.0)

## 2013-07-02 MED ORDER — LABETALOL HCL 200 MG PO TABS
400.0000 mg | ORAL_TABLET | Freq: Two times a day (BID) | ORAL | Status: DC
  Administered 2013-07-02 – 2013-07-05 (×6): 400 mg via ORAL
  Filled 2013-07-02 (×7): qty 2

## 2013-07-02 NOTE — Progress Notes (Signed)
Doing a bit better. Mentation is faster. Following more commands. No pain. CXR shows clearing FBS 113. Cr stable Objective: Vital signs in last 24 hours: Temp:  [97.9 F (36.6 C)-98.6 F (37 C)] 98.5 F (36.9 C) (09/12 0619) Pulse Rate:  [65-82] 82 (09/12 1003) Resp:  [16-18] 16 (09/12 0619) BP: (123-179)/(48-81) 151/58 mmHg (09/12 1003) SpO2:  [96 %-100 %] 96 % (09/12 0619) Weight:  [63.4 kg (139 lb 12.4 oz)] 63.4 kg (139 lb 12.4 oz) (09/12 0619)  Intake/Output from previous day: 09/11 0701 - 09/12 0700 In: 1410 [P.O.:1260; IV Piggyback:150] Out: 736 [Urine:735; Stool:1] Intake/Output this shift:    Left periorbital edema is a bit better. Lungs: a few rhonchi. Ht regular abd soft NT Speech is clearer  Lab Results   Recent Labs  07/01/13 0650 07/02/13 0542  WBC 6.3 4.6  RBC 3.76* 3.96  HGB 10.2* 10.8*  HCT 32.0* 33.4*  MCV 85.1 84.3  MCH 27.1 27.3  RDW 16.7* 16.8*  PLT 249 267    Recent Labs  07/01/13 0650 07/02/13 0542  NA 140 143  K 3.7 3.9  CL 103 107  CO2 28 27  GLUCOSE 171* 99  BUN 26* 23  CREATININE 1.27* 1.35*  CALCIUM 9.1 9.1    Studies/Results: Dg Chest 2 View  07/01/2013   *RADIOLOGY REPORT*  Clinical Data: Productive cough, recent pneumonia  CHEST - 2 VIEW  Comparison: Chest x-ray of 06/28/2013  Findings: The opacities noted in the right midlung, right lung base and left upper lung field recently have cleared.  No focal infiltrate or effusion is seen.  Slightly prominent superior mediastinal structures most likely reflect ectatic great vessels. Cardiomegaly is stable.  The bones are mildly osteopenic.  IMPRESSION: Patchy lung opacities noted previously have cleared.  No focal infiltrate or effusion is seen.  Stable cardiomegaly.   Original Report Authenticated By: Dwyane Dee, M.D.   Dg Swallowing Func-speech Pathology  07/01/2013   [name], CCC-SLP     07/01/2013  1:59 PM Objective Swallowing Evaluation: Modified Barium Swallowing Study    Patient Details  Name: [name] MRN: [id number] Date of Birth: 1941/10/04  Today's Date: 07/01/2013 Time: 1300-1330 SLP Time Calculation (min): 30 min  Past Medical History:  Past Medical History  Diagnosis Date  . Diabetes mellitus   . Diabetic neuropathy   . Hypothyroidism   . Hypertension   . GERD (gastroesophageal reflux disease)   . Heart murmur     not treated for  . Arthritis   . Chronic kidney disease     RENAL TRANSPLANT IN 2008--PT IS FOLLOWED BY DR. Karie Fetch  WITH BUN OF 37 AND CREAT 1.7--PER NOTE DR. NESI FROM 04/11/12  . Urethral diverticulum   . Eyesight diminished     GLAUCOMA   . History of shingles     LEFT EYE JAN 2012--STILL HAS EYE PAIN-STATES NOT ABLE TO TAKE  THE MEDICATION FOR SHINGLES BECAUSE OF HER HX OF KIDNEY  TRANSPLANT  . Fingernail abnormalities     FUNGUS OF FINGERNAILS  . UTI (urinary tract infection) 05/18/2013   Past Surgical History:  Past Surgical History  Procedure Laterality Date  . Kidney transplant  2008    At [facility]  . Back surgery      ruptured disk  . Abdominal hysterectomy    . Appendectomy    . Laser surgery of both eyes for hemorrhages    . 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 Urethral diverticulectomy  04/28/2012    Procedure: URETHRAL DIVERTICULECTOMY;  Surgeon: Antony Haste, MD;  Location: [facility];  Service: Urology;  Laterality:  N/A;   HPI:  Pt is a 72 year old female with chronic kidney disease status  post renal transplant in 2008, receiving temporary dialysis  through a catheter in her left upper extremity, currently on  immunosuppressive therapy and multiple hospitalizations for  recurrent UTI associated with altered mental status. At baseline  she does live at home with her husband, able to transport herself  with a walker, complicated by some low back discomfort with  history of herniated disc and surgery. She does have some  baseline dementia and was scheduled to be evaluated by neurology  this coming week. She has had multiple  enterococcal UTIs, treated  with a variety of antibiotic regimens, with some cultures growing  out multiple pathogens. Recently she has been treated by  nephrology with Macrobid per patient husband report, as soon as  7-10 days ago. However over the last one week, patient has  developed worsening problems with mental status changes, unable  to mobilize her needs and wants, now not ambulating, with nausea  and anorexia. Pt had chest X-ray on 06/28/13 which showed patchy  right sided pulmonary infiltrates and possible component of left  upper lobe pneumonia as well. BSE today to assess swallowing  function.      Assessment / Plan / Recommendation Clinical Impression  Dysphagia Diagnosis: Within Functional Limits Clinical impression: Pt presents with oropharyngeal swallowing  function that is grossly WFL. Although her oral phase is mildly  prolonged with mild oral residue with solids, residuals are  reflexively cleared with a second swallow. Pharyngeal phase  initiates timely at the BOT and is effectively passed through the  pharynx with no residuals. Recommend to resume regular textures  and thin liquids. SLP to f/u briefly to ensure tolerance with  meals and provide education.    Treatment Recommendation  Therapy as outlined in treatment plan below    Diet Recommendation Regular;Thin liquid   Liquid Administration via: Cup;Straw;No straw Medication Administration: Whole meds with liquid Supervision: Patient able to self feed;Full supervision/cueing  for compensatory strategies (okay with husband) Compensations: Slow rate;Small sips/bites Postural Changes and/or Swallow Maneuvers: Seated upright 90  degrees;Upright 30-60 min after meal    Other  Recommendations Oral Care Recommendations: Oral care BID   Follow Up Recommendations  24 hour supervision/assistance;Home health SLP    Frequency and Duration min 2x/week  2 weeks   Pertinent Vitals/Pain N/A    SLP Swallow Goals Patient will consume recommended diet without  observed clinical  signs of aspiration with: Maximal cueing Swallow Study Goal #1 - Progress: Progressing toward goal Patient will utilize recommended strategies during swallow to  increase swallowing safety with: Maximal cueing Swallow Study Goal #2 - Progress: Progressing toward goal   General Date of Onset: 06/29/13 HPI: Pt is a 72 year old female with chronic kidney disease  status post renal transplant in 2008, receiving temporary  dialysis through a catheter in her left upper extremity,  currently on immunosuppressive therapy and multiple  hospitalizations for recurrent UTI associated with altered mental  status. At baseline she does live at home with her husband, able  to transport herself with a walker, complicated by some low back  discomfort with history of herniated disc and surgery. She does  have some baseline dementia and was scheduled to be evaluated by  neurology this coming week. She has had multiple enterococcal  UTIs,  treated with a variety of antibiotic regimens, with some  cultures growing out multiple pathogens. Recently she has been  treated by nephrology with Macrobid per patient husband report,  as soon as 7-10 days ago. However over the last one week, patient  has developed worsening problems with mental status changes,  unable to mobilize her needs and wants, now not ambulating, with  nausea and anorexia. Pt had chest X-ray on 06/28/13 which showed  patchy right sided pulmonary infiltrates and possible component  of left upper lobe pneumonia as well. BSE today to assess  swallowing function.  Type of Study: Modified Barium Swallowing Study Reason for Referral: Objectively evaluate swallowing function Previous Swallow Assessment: N/A Diet Prior to this Study: Dysphagia 1 (puree);Thin liquids Temperature Spikes Noted: Yes (low grade) Respiratory Status: Room air History of Recent Intubation: No Behavior/Cognition: Confused;Alert;Distractible;Requires cueing Oral Cavity - Dentition: Dentures,  top;Dentures, bottom Oral Motor / Sensory Function: Within functional limits Self-Feeding Abilities: Able to feed self;Needs assist Patient Positioning: Upright in chair Baseline Vocal Quality: Clear Anatomy: Within functional limits Pharyngeal Secretions: Not observed secondary MBS    Reason for Referral Objectively evaluate swallowing function   Oral Phase Oral Preparation/Oral Phase Oral Phase: Impaired Oral - Thin Oral - Thin Straw: Holding of bolus Oral - Solids Oral - Puree: Weak lingual manipulation;Lingual/palatal  residue;Delayed oral transit;Other (Comment) (reflexively cleared  with second swallow) Oral - Mechanical Soft: Impaired mastication;Weak lingual  manipulation;Lingual/palatal residue;Delayed oral transit;Other  (Comment) (reflexively cleared with second swallow) Oral - Pill: Within functional limits   Pharyngeal Phase Pharyngeal Phase Pharyngeal Phase: Within functional limits  Cervical Esophageal Phase    GO    Cervical Esophageal Phase Cervical Esophageal Phase: [facility]         [name] 07/01/2013, 1:59 PM  [name], M.A. CCC-SLP [phone]   Scheduled Meds: . amLODipine  10 mg Oral Q breakfast  . calcitRIOL  0.5 mcg Oral Q breakfast  . cefTRIAXone (ROCEPHIN)  IV  1 g Intravenous Q24H  . enoxaparin (LOVENOX) injection  30 mg Subcutaneous Q24H  . famotidine  20 mg Oral Daily  . feeding supplement (NEPRO CARB STEADY)  237 mL Oral BID BM  . fentaNYL  50 mcg Transdermal Q3 days  . gabapentin  300 mg Oral QHS  . insulin aspart  0-9 Units Subcutaneous TID WC  . insulin detemir  15 Units Subcutaneous Daily  . ketoconazole  1 application Topical BID  . labetalol  300 mg Oral BID  . latanoprost  1 drop Both Eyes QHS  . levothyroxine  224 mcg Oral QAC breakfast  . mycophenolate  500 mg Oral BID  . potassium chloride  20 mEq Oral Daily  . predniSONE  10 mg Oral Q breakfast  . sirolimus  3 mg Oral Daily  . vancomycin  500 mg Intravenous Q24H   Continuous Infusions:   PRN Meds:acetaminophen, acetaminophen  Assessment/Plan: PNEUMONIA: recheck CXR showed good clearing. Stop vanco Altered mental status- EEG and CT OK. Making some progress History of recurrent UTI: no new data  Diabetes mellitus with microvascular complications: FBS good, no lows Hypothyroidism-continue home medication regimen. thyroid level is fine  Hypertension-Improved  GERD-continue H2-blocker  Chronic kidney disease status post renal transplant now with stage III chronic kidney disease followed by nephrology: [place] looks fine. Cr stable at 1.35 Underlying dementia : was to see neuro as an outpt.  Gait abnormality with history of back surgery, ruptured disc, on chronic pain medications, using a walker at baseline, no  recent falls per husband report  Anemia: has dropped with hydration but now stable  Hypokalemia: resolved  PROTEIN CALORIE MALNUTRITION/ADULT FTT: Alb low at 2.8  Dysphagia: did well with Barium swallow, go to unrestricted consistency DISP: may need SNF    LOS: 5 days   Yilia Sacca [name] 07/02/2013, 10:52 AM

## 2013-07-02 NOTE — Progress Notes (Addendum)
[name] Progress Note    Assessment/ Plan:   1. ESRD: Secondary to DM. 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 Below her baseline cr since admission. 1.35 today. Sirolimus level 7.5. - Continue home cellcept 500mg  BID, prednisone 10mg  daily, and sirolimus 3mg  daily - Patient with stable PO of >50% of meals - continue Calcitriol 0.77mcg daily   2. Recurrent UTI - UA with trace leuks, not sent for cx. Abd [place] of allograft - no masses or hydronephrosis, GFR<50, macrobid will NOT concentrate into urine and provide no ppx against UTI, recommend stopping.   3. AMS - worsening over 3-4 months, no sz activity on EEG, though consistent with encephalopathy. Neuro planning outpatient followup for evaluation of dementia. As CXR clear and no UTI or other s/s of infection, her mentation is likely chronic and her new baseline.  - EBV pcr negative; also consider PML though would need MRI to eval and neuro has deferred w/u for outpatient  - swallow study with moderate aspiration risk, SLP recommending regular diet with thin liquids - ordered renal diet - Continue QHS dosing of neurontin (decreased from TID) for suspected toxicity  - SW helping husband choose short-term SNF placement - f/u SW recs   4. Hypokalemia - Resolved. Will discontinue KCL if potassium >4 but continue for now. 3.9 today   5. HTN - Per primary. Norvasc 10 and labetalol 300mg  BID.  - BP remains elevated to 170s systolic despite IVF discontinuation - Will increase to labetalol 400mg  BID and monitor for bradycardia  6. Anemia - Saturation ratio >=30 and stable. Will continue to monitor. Hgb stable   7. Weight loss: Likely malnourished and cause of chronic metabolic alkalosis. Will continue to monitor.  - continue Nepro BID between meals   8. IDDM with peripheral neuopathy - Per primary.  - Detemir 15u daily and on SSI , glucose improving, 99 this morning    9. Pneumonia - Repeat CXR yesterday  shows clearance of infiltrates. Afebrile with normalized WBC count and no increased cough. - Completed 5 days of vanc and rocephin, blood cultures NGTD - consider de-escalating abx and d/c'ing after total 7 day course (possibly to oral third gen cephalosporin) - defer to primary team  Subjective:   Patient more alert and oriented this morning. Oriented to name, hospital, city. Still with repetitive hand movements (L>R) and orofacial dyskinesia.  Denies any new pain, difficulty breathing, chest pain, or burning with urination.   Objective:   BP 168/66  Pulse 65  Temp(Src) 98.5 F (36.9 C) (Oral)  Resp 16  Ht 5' 4.8" (1.646 m)  Wt 63.4 kg (139 lb 12.4 oz)  BMI 23.4 kg/m2  SpO2 96%  Intake/Output Summary (Last 24 hours) at 07/02/13 0754 Last data filed at 07/02/13 0700  Gross per 24 hour  Intake   1410 ml  Output    736 ml  Net    674 ml   Weight change: 0.6 kg (1 lb 5.2 oz)  Physical Exam: General appearance - awake, alert, oriented to name, hospital, city Eyes - sclera clear with left eye pingecula  Mouth - moist mucus membranes, no exudate or erythema  Neck - supple, no significant adenopathy  Lymphatics - no hepatosplenomegaly  Chest - Normal effort; clear to auscultation bilaterally Heart - normal rate and regular rhythm, no gallops noted, systolic murmur 2/6 heard best at 2nd right intercostal space but heard throughout the precordium, no JVD  Abdomen - NABS, soft, nontender, nondistended,  no masses or organomegaly, no tenderness over kidney transplant site (right lower quadrant)  Neuro - fine tremors bilateral hands, occasional puckering of lips, follows commands most of the time, clear speech this morning, more alert than previous days Musculoskeletal - no joint tenderness, deformity or swelling, no muscular tenderness noted, 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  Imaging: Dg Chest 2 View  07/01/2013   *RADIOLOGY REPORT*  Clinical Data: Productive cough, recent pneumonia  CHEST - 2 VIEW  Comparison: Chest x-ray of 06/28/2013  Findings: The opacities noted in the right midlung, right lung base and left upper lung field recently have cleared.  No focal infiltrate or effusion is seen.  Slightly prominent superior mediastinal structures most likely reflect ectatic great vessels. Cardiomegaly is stable.  The bones are mildly osteopenic.  IMPRESSION: Patchy lung opacities noted previously have cleared.  No focal infiltrate or effusion is seen.  Stable cardiomegaly.   Original Report Authenticated By: Dwyane Dee, M.D.   Dg Swallowing Func-speech Pathology  07/01/2013   [name], CCC-SLP     07/01/2013  1:59 PM Objective Swallowing Evaluation: Modified Barium Swallowing Study   Patient Details  Name: [name] MRN: [id number] Date of Birth: 05/29/1941  Today's Date: 07/01/2013 Time: 1300-1330 SLP Time Calculation (min): 30 min  Past Medical History:  Past Medical History  Diagnosis Date  . Diabetes mellitus   . Diabetic neuropathy   . Hypothyroidism   . Hypertension   . GERD (gastroesophageal reflux disease)   . Heart murmur     not treated for  . Arthritis   . Chronic kidney disease     RENAL TRANSPLANT IN 2008--PT IS FOLLOWED BY DR. Karie Fetch  WITH BUN OF 37 AND CREAT 1.7--PER NOTE DR. NESI FROM 04/11/12  . Urethral diverticulum   . Eyesight diminished     GLAUCOMA   . History of shingles     LEFT EYE JAN 2012--STILL HAS EYE PAIN-STATES NOT ABLE TO TAKE  THE MEDICATION FOR SHINGLES BECAUSE OF HER HX OF KIDNEY  TRANSPLANT  . Fingernail abnormalities     FUNGUS OF FINGERNAILS  . UTI (urinary tract infection) 05/18/2013   Past Surgical History:  Past Surgical History  Procedure Laterality Date  . Kidney transplant  2008     At [facility]  . Back surgery      ruptured disk  . Abdominal hysterectomy    . Appendectomy    . Laser surgery of both eyes for hemorrhages    . 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 Urethral diverticulectomy  04/28/2012    Procedure: URETHRAL DIVERTICULECTOMY;  Surgeon: Antony Haste, MD;  Location: [facility];  Service: Urology;  Laterality:  N/A;   HPI:  Pt is a 72 year old female with chronic kidney disease status  post renal transplant in 2008, receiving temporary dialysis  through a catheter in her left upper extremity, currently on  immunosuppressive therapy and multiple hospitalizations for  recurrent UTI associated with altered mental status. At baseline  she does live at home with her husband, able to transport herself  with a walker, complicated by some low back discomfort with  history of herniated disc and surgery. She does have some  baseline dementia and was  scheduled to be evaluated by neurology  this coming week. She has had multiple enterococcal UTIs, treated  with a variety of antibiotic regimens, with some cultures growing  out multiple pathogens. Recently she has been treated by  nephrology with Macrobid per patient husband report, as soon as  7-10 days ago. However over the last one week, patient has  developed worsening problems with mental status changes, unable  to mobilize her needs and wants, now not ambulating, with nausea  and anorexia. Pt had chest X-ray on 06/28/13 which showed patchy  right sided pulmonary infiltrates and possible component of left  upper lobe pneumonia as well. BSE today to assess swallowing  function.      Assessment / Plan / Recommendation Clinical Impression  Dysphagia Diagnosis: Within Functional Limits Clinical impression: Pt presents with oropharyngeal swallowing  function that is grossly WFL. Although her oral phase is mildly  prolonged with mild oral residue with solids, residuals are  reflexively cleared with a second swallow. Pharyngeal phase   initiates timely at the BOT and is effectively passed through the  pharynx with no residuals. Recommend to resume regular textures  and thin liquids. SLP to f/u briefly to ensure tolerance with  meals and provide education.    Treatment Recommendation  Therapy as outlined in treatment plan below    Diet Recommendation Regular;Thin liquid   Liquid Administration via: Cup;Straw;No straw Medication Administration: Whole meds with liquid Supervision: Patient able to self feed;Full supervision/cueing  for compensatory strategies (okay with husband) Compensations: Slow rate;Small sips/bites Postural Changes and/or Swallow Maneuvers: Seated upright 90  degrees;Upright 30-60 min after meal    Other  Recommendations Oral Care Recommendations: Oral care BID   Follow Up Recommendations  24 hour supervision/assistance;Home health SLP    Frequency and Duration min 2x/week  2 weeks   Pertinent Vitals/Pain N/A    SLP Swallow Goals Patient will consume recommended diet without observed clinical  signs of aspiration with: Maximal cueing Swallow Study Goal #1 - Progress: Progressing toward goal Patient will utilize recommended strategies during swallow to  increase swallowing safety with: Maximal cueing Swallow Study Goal #2 - Progress: Progressing toward goal   General Date of Onset: 06/29/13 HPI: Pt is a 72 year old female with chronic kidney disease  status post renal transplant in 2008, receiving temporary  dialysis through a catheter in her left upper extremity,  currently on immunosuppressive therapy and multiple  hospitalizations for recurrent UTI associated with altered mental  status. At baseline she does live at home with her husband, able  to transport herself with a walker, complicated by some low back  discomfort with history of herniated disc and surgery. She does  have some baseline dementia and was scheduled to be evaluated by  neurology this coming week. She has had multiple enterococcal  UTIs, treated with a variety  of antibiotic regimens, with some  cultures growing out multiple pathogens. Recently she has been  treated by nephrology with Macrobid per patient husband report,  as soon as 7-10 days ago. However over the last one week, patient  has developed worsening problems with mental status changes,  unable to mobilize her needs and wants, now not ambulating, with  nausea and anorexia. Pt had chest X-ray on 06/28/13 which showed  patchy right sided pulmonary infiltrates and possible component  of left upper lobe pneumonia as well. BSE today to assess  swallowing function.  Type of Study: Modified Barium Swallowing Study Reason for Referral: Objectively evaluate swallowing function Previous  Swallow Assessment: N/A Diet Prior to this Study: Dysphagia 1 (puree);Thin liquids Temperature Spikes Noted: Yes (low grade) Respiratory Status: Room air History of Recent Intubation: No Behavior/Cognition: Confused;Alert;Distractible;Requires cueing Oral Cavity - Dentition: Dentures, top;Dentures, bottom Oral Motor / Sensory Function: Within functional limits Self-Feeding Abilities: Able to feed self;Needs assist Patient Positioning: Upright in chair Baseline Vocal Quality: Clear Anatomy: Within functional limits Pharyngeal Secretions: Not observed secondary MBS    Reason for Referral Objectively evaluate swallowing function   Oral Phase Oral Preparation/Oral Phase Oral Phase: Impaired Oral - Thin Oral - Thin Straw: Holding of bolus Oral - Solids Oral - Puree: Weak lingual manipulation;Lingual/palatal  residue;Delayed oral transit;Other (Comment) (reflexively cleared  with second swallow) Oral - Mechanical Soft: Impaired mastication;Weak lingual  manipulation;Lingual/palatal residue;Delayed oral transit;Other  (Comment) (reflexively cleared with second swallow) Oral - Pill: Within functional limits   Pharyngeal Phase Pharyngeal Phase Pharyngeal Phase: Within functional limits  Cervical Esophageal Phase    GO    Cervical Esophageal Phase  Cervical Esophageal Phase: [facility]         [name] 07/01/2013, 1:59 PM  [name], M.A. CCC-SLP [phone]   Labs: BMET  Recent Labs Lab 06/27/13 1540 06/28/13 0422 06/29/13 0540 06/30/13 1235 07/01/13 0650 07/02/13 0542  NA 142 142 139 137 140 143  K 3.9 3.3* 3.8 3.8 3.7 3.9  CL 97 99 100 100 103 107  CO2 34* 34* 31 27 28 27   GLUCOSE 197* 50* 195* 329* 171* 99  BUN 33* 34* 34* 28* 26* 23  CREATININE 1.76* 1.75* 1.65* 1.36* 1.27* 1.35*  CALCIUM 10.0 8.9 9.1 9.0 9.1 9.1   CBC  Recent Labs Lab 06/27/13 1552  06/29/13 0540 06/30/13 1235 07/01/13 0650 07/02/13 0542  WBC 13.0*  < > 11.7* 9.1 6.3 4.6  NEUTROABS 11.9*  --   --   --   --   --   HGB 13.4  < > 10.3* 10.4* 10.2* 10.8*  HCT 40.1  < > 32.2* 33.6* 32.0* 33.4*  MCV 85.0  < > 85.0 85.9 85.1 84.3  PLT 185  < > 206 210 249 267  < > = values in this interval not displayed.  Medications:    . amLODipine  10 mg Oral Q breakfast  . calcitRIOL  0.5 mcg Oral Q breakfast  . cefTRIAXone (ROCEPHIN)  IV  1 g Intravenous Q24H  . enoxaparin (LOVENOX) injection  30 mg Subcutaneous Q24H  . famotidine  20 mg Oral Daily  . feeding supplement (NEPRO CARB STEADY)  237 mL Oral BID BM  . fentaNYL  50 mcg Transdermal Q3 days  . gabapentin  300 mg Oral QHS  . insulin aspart  0-9 Units Subcutaneous TID WC  . insulin detemir  15 Units Subcutaneous Daily  . ketoconazole  1 application Topical BID  . labetalol  300 mg Oral BID  . latanoprost  1 drop Both Eyes QHS  . levothyroxine  224 mcg Oral QAC breakfast  . mycophenolate  500 mg Oral BID  . potassium chloride  20 mEq Oral Daily  . predniSONE  10 mg Oral Q breakfast  . sirolimus  3 mg Oral Daily  . vancomycin  500 mg Intravenous Q24H    Lewie Chamber, MS4 07/02/2013, 7:54 AM    I have seen and examined patient (briefly, given on commode) with the MS4 and agree with assessment and plan, with my additions noted above.  Leona Singleton, MD PGY-2 07/02/2013  8:25 AM

## 2013-07-02 NOTE — Evaluation (Signed)
Occupational Therapy Evaluation Patient Details Name: [name] MRN: [id number] DOB: 1941/08/01 Today's Date: 07/02/2013 Time: 4098-1191 OT Time Calculation (min): 20 min  OT Assessment / Plan / Recommendation History of present illness  72 year old female with chronic kidney disease status post renal transplant in 2008, receiving temporary dialysis through a catheter in her left upper extremity, currently on immunosuppressive therapy and multiple hospitalizations for recurrent UTI associated with altered mental status. At baseline she does live at home with her husband, able to transport himself with a walker, complicated by some low back discomfort with history of herniated disc and surgery. She does have some baseline dementia and was scheduled to be evaluated by neurology this coming week. She has had multiple enterococcal UTIs, treated with a variety of antibiotic regimens, with some cultures growing out multiple pathogens. Recently she has been treated by nephrology with Macrobid per patient husband report, as soon as 7-10 days ago. However over the last one week, patient has developed worsening problems with mental status changes, unable to mobilize her needs and wants, now not ambulating, with nausea and anorexia.    Clinical Impression   Pt admitted for UTI and decline in function and has the deficits listed below.  Pt would benefit from cont OT to increase I with basic adls and decrease burden of care on her husband.    OT Assessment  Patient needs continued OT Services    Follow Up Recommendations  SNF    Barriers to Discharge Other (comment) husband having hard time handleing pt at home  Equipment Recommendations  None recommended by OT    Recommendations for Other Services    Frequency  Min 2X/week    Precautions / Restrictions Precautions Precautions: Fall Restrictions Weight Bearing Restrictions: No   Pertinent Vitals/Pain Pt did not c/o pain.    ADL  Eating/Feeding: Performed;Moderate assistance Where Assessed - Eating/Feeding: Edge of bed Grooming: Performed;Teeth care;Wash/dry face;Minimal assistance Where Assessed - Grooming: Supported sitting Upper Body Bathing: Simulated;Moderate assistance Where Assessed - Upper Body Bathing: Supported sitting Lower Body Bathing: Simulated;+1 Total assistance Where Assessed - Lower Body Bathing: Supported sit to stand Upper Body Dressing: Simulated;Moderate assistance Where Assessed - Upper Body Dressing: Supported sitting Lower Body Dressing: Simulated;+1 Total assistance Where Assessed - Lower Body Dressing: Supported sit to stand Toilet Transfer: Performed;+1 Total assistance Toilet Transfer Method: Ambulance person: Materials engineer and Hygiene: Simulated;+2 Total assistance Toileting - Architect and Hygiene: Patient Percentage: 10% Where Assessed - Engineer, mining and Hygiene: Sit to stand from 3-in-1 or toilet Equipment Used: Gait belt Transfers/Ambulation Related to ADLs: Pt unable to walk at this time.  Pt was total A transfer to pivot to [facility]. ADL Comments: Pt has become very dependent on husband to assist with adls.  Pt did some grooming but was cold and fatigued and wanted to lay back down.    OT Diagnosis: Generalized weakness;Cognitive deficits  OT Problem List: Decreased strength;Decreased activity tolerance;Decreased cognition;Decreased knowledge of use of DME or AE;Impaired UE functional use OT Treatment Interventions: Self-care/ADL training;DME and/or AE instruction;Therapeutic activities   OT Goals(Current goals can be found in the care plan section) Acute Rehab OT Goals Patient Stated Goal: to go home. OT Goal Formulation: With patient Time For Goal Achievement: 07/16/13 Potential to Achieve Goals: Good ADL Goals Pt Will Perform Grooming: with supervision;sitting Pt Will Perform Upper Body  Bathing: with set-up;sitting Pt Will Transfer to Toilet: with mod assist;bedside commode Additional ADL Goal #  1: Pt will compete one task in standing with mod assist in prep for adls in standing.  Visit Information  Last OT Received On: 07/02/13 Assistance Needed: +2 History of Present Illness:  72 year old female with chronic kidney disease status post renal transplant in 2008, receiving temporary dialysis through a catheter in her left upper extremity, currently on immunosuppressive therapy and multiple hospitalizations for recurrent UTI associated with altered mental status. At baseline she does live at home with her husband, able to transport himself with a walker, complicated by some low back discomfort with history of herniated disc and surgery. She does have some baseline dementia and was scheduled to be evaluated by neurology this coming week. She has had multiple enterococcal UTIs, treated with a variety of antibiotic regimens, with some cultures growing out multiple pathogens. Recently she has been treated by nephrology with Macrobid per patient husband report, as soon as 7-10 days ago. However over the last one week, patient has developed worsening problems with mental status changes, unable to mobilize her needs and wants, now not ambulating, with nausea and anorexia.        Prior Functioning     Home Living Family/patient expects to be discharged to:: Skilled nursing facility Living Arrangements: Spouse/significant other Type of Home: House Home Equipment: Dan Humphreys - 4 wheels;Bedside commode;Grab bars - tub/shower;Wheelchair - power Additional Comments: used the walker in the house and the powered wheelchair out in the community. Prior Function Level of Independence: Needs assistance Gait / Transfers Assistance Needed: used to be able to walk to bathroom Ily but last few weeks unable to even transfer w/o assist. ADL's / Homemaking Assistance Needed: husband has been doing all  homemaking and assisting pt with all bathing, dressing, grooming and toileting over last few weeks. Comments: pt could walk to the bathroom with the walker without help when she was doing good, over last month pt has needed more assist for mobility and ADLs per spouse Communication Communication: HOH Dominant Hand: Right         Vision/Perception Vision - History Visual History: Glaucoma Patient Visual Report: No change from baseline Vision - Assessment Vision Assessment: Vision not tested   Cognition  Cognition Arousal/Alertness: Awake/alert Behavior During Therapy: WFL for tasks assessed/performed Overall Cognitive Status: History of cognitive impairments - at baseline Memory: Decreased short-term memory    Extremity/Trunk Assessment Upper Extremity Assessment Upper Extremity Assessment: LUE deficits/detail;RUE deficits/detail RUE Deficits / Details: Pt limited in shoulder flexion to 90 degrees.  Unsure if she was just resisting or could not move it over 90.  Pt with old R shoulder surgery. LUE Deficits / Details: Pt also unable to flex L shoulder above 90.  Unsure if unable or resisting ROM due to pain. Pt unable to verbalize this. Cervical / Trunk Assessment Cervical / Trunk Assessment: Kyphotic     Mobility Bed Mobility Bed Mobility: Supine to Sit;Sit to Supine;Scooting to HOB Supine to Sit: 2: Max assist;HOB elevated Sit to Supine: 2: Max assist Scooting to HOB: 1: +2 Total assist Scooting to [facility]: Patient Percentage: 0% Details for Bed Mobility Assistance: verbal and tactile cues for assist however pt with poor initiation and required increased assist Transfers Transfers: Sit to Stand;Stand to Sit Sit to Stand: 1: +1 Total assist;From bed;With armrests Stand to Sit: 1: +1 Total assist;To bed;With armrests Details for Transfer Assistance: Pt's spouse transfers pt in the hospital as a total lift.     Exercise     Balance Balance Balance Assessed: Yes Static  Sitting Balance Static Sitting - Balance Support: Feet supported;No upper extremity supported Static Sitting - Level of Assistance: 4: Min assist Static Sitting - Comment/# of Minutes: 10   End of Session OT - End of Session Activity Tolerance: Patient limited by fatigue Patient left: in bed;with call bell/phone within reach Nurse Communication: Mobility status  GO     Hope Budds 07/02/2013, 11:29 AM [phone]

## 2013-07-02 NOTE — Progress Notes (Signed)
I have personally seen and examined this patient and agree with the assessment/plan as outlined above by Benjamin Stain MD (PGY2). Making slow progress but progress nonetheless with her mental status-symptoms of dyskinesia/questionable akathisia persist. Anticipate discharge to skilled nursing facility when husband makes a decision regarding place. Renal allograft function stable. Davonne Jarnigan K.,MD 07/02/2013 12:39 PM

## 2013-07-03 LAB — RENAL FUNCTION PANEL
Albumin: 2.6 g/dL — ABNORMAL LOW (ref 3.5–5.2)
Calcium: 9.1 mg/dL (ref 8.4–10.5)
Creatinine, Ser: 1.38 mg/dL — ABNORMAL HIGH (ref 0.50–1.10)
GFR calc non Af Amer: 37 mL/min — ABNORMAL LOW (ref >90)

## 2013-07-03 LAB — GLUCOSE, CAPILLARY: Glucose-Capillary: 147 mg/dL — ABNORMAL HIGH (ref 70–99)

## 2013-07-03 LAB — MAGNESIUM: Magnesium: 2.1 mg/dL (ref 1.5–2.5)

## 2013-07-03 NOTE — Progress Notes (Signed)
Provided husband with current SNF bed offers- [facility] is not one of these as of yet- will leave message for [facility] for consideration.  Reece Levy, MSW [phone]

## 2013-07-03 NOTE — Progress Notes (Signed)
I have personally seen and examined this patient and agree with the assessment/plan as outlined above by Ermalinda Memos MD (PGY2). Stable renal function with plans noted for SNF placement soon. No changes from renal standpoint.  Bode Pieper K.,MD 07/03/2013 10:07 AM

## 2013-07-03 NOTE — Progress Notes (Signed)
[name] Progress Note    Assessment/ Plan:   1. ESRD:  - Secondary to DM. 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 Below her baseline cr since admission. 1.35 today.  - Sirolimus level therapeutic at 7.5. - Continue home cellcept 500mg  BID, prednisone 10mg  daily, and sirolimus 3mg  daily - continue Calcitriol 0.52mcg daily   2. Recurrent UTI -  - Abd [place] of allograft - no masses or hydronephrosis,  - GFR<50, macrobid will NOT concentrate into urine and provide no ppx against UTI, recommend stopping.   3. AMS - worsening over 3-4 months, no sz activity on EEG, though consistent with encephalopathy. Neuro planning outpatient followup for evaluation of dementia. As CXR clear and no UTI or other s/s of infection, her mentation is likely chronic and her new baseline.  - EBV pcr negative; also consider PML though would need MRI to eval and neuro has deferred w/u for outpatient  - swallow study with moderate aspiration risk, SLP recommending regular diet with thin liquids - ordered renal diet - Continue QHS dosing of neurontin (decreased from TID) for suspected toxicity  - SW helping husband choose short-term SNF placement - f/u SW recs   4. Hypokalemia - Resolved. K 4.1 today, dc'd [facility]  5. HTN - Per primary. Norvasc 10 and labetalol 400mg  BID.  - BP still elevated to avg 150/60, one low of 113/41 may limit how much more aggressive we can be.  - labetalol increased to 400mg  BID, pulse stable in 70s, some improvement from 170s systolic  6. Anemia - Saturation ratio >=30 and stable. Hgb stable on 9/12  7. Weight loss: Likely malnourished and cause of chronic metabolic alkalosis. Will continue to monitor.  - continue Nepro BID between meals   8. IDDM with peripheral neuopathy - Per primary.  - Detemir 15u daily and 3 units novolog TID WC  9. Pneumonia - Repeat CXR shows clearance of infiltrates. Afebrile with normalized WBC count and no increased  cough. - Completed 5 days of vanc and rocephin, now on rocephin d 6, blood cultures NGTD - consider de-escalating abx and d/c'ing after total 7 day course - defer to primary team  Subjective:   Husband reports waxing waning mental status, states she was very confused last night arguiing with him for an hour about getting off of the bedside commode.   Objective:   BP 159/52  Pulse 72  Temp(Src) 99.3 F (37.4 C) (Oral)  Resp 18  Ht 5' 4.8" (1.646 m)  Wt 139 lb 12.4 oz (63.4 kg)  BMI 23.4 kg/m2  SpO2 95%  Intake/Output Summary (Last 24 hours) at 07/03/13 0753 Last data filed at 07/03/13 0620  Gross per 24 hour  Intake    360 ml  Output      0 ml  Net    360 ml   Weight change:   Physical Exam:  Gen: NAD, sleeping, arousable and responding to simple commands. HEENT: NCAT, tongue protruding, MMM CV: RRR, good S1/S2, 2/6 systolic murmur not appreciated.  Resp: CTABL, no wheezes, non-labored Abd: SNTND, BS present, no guarding or organomegaly Ext: 1+ edema on BL LE, warm Neuro: fince tremor on BL hands, sleeping, responding with grunts but following simple commands.    Imaging: Dg Chest 2 View  07/01/2013   *RADIOLOGY REPORT*  IMPRESSION:  Patchy lung opacities noted previously have cleared. No focal  infiltrate or effusion is seen. Stable cardiomegaly.   Dg Swallowing Func-speech Pathology 07/01/2013  Clinical Impression  Dysphagia Diagnosis: Within Functional Limits  Clinical impression: Pt presents with oropharyngeal swallowing function that is grossly WFL. Although her oral phase is mildly prolonged with mild oral residue with solids, residuals are reflexively cleared with a second swallow. Pharyngeal phase initiates timely at the BOT and is effectively passed through the pharynx with no residuals. Recommend to resume regular textures and thin liquids. SLP to f/u briefly to ensure tolerance with meals and provide education.  Treatment Recommendation  Diet  Recommendation Regular;Thin liquid  Liquid Administration via: Cup;Straw;No straw  Medication Administration: Whole meds with liquid  Supervision: Patient able to self feed;Full supervision/cueing for compensatory strategies (okay with husband)  Compensations: Slow rate;Small sips/bites  Postural Changes and/or Swallow Maneuvers: Seated upright 90 degrees;Upright 30-60 min after meal   Labs: BMET  Recent Labs Lab 06/27/13 1540 06/28/13 0422 06/29/13 0540 06/30/13 1235 07/01/13 0650 07/02/13 0542 07/03/13 0548  NA 142 142 139 137 140 143 139  K 3.9 3.3* 3.8 3.8 3.7 3.9 4.1  CL 97 99 100 100 103 107 104  CO2 34* 34* 31 27 28 27 26   GLUCOSE 197* 50* 195* 329* 171* 99 153*  BUN 33* 34* 34* 28* 26* 23 24*  CREATININE 1.76* 1.75* 1.65* 1.36* 1.27* 1.35* 1.38*  CALCIUM 10.0 8.9 9.1 9.0 9.1 9.1 9.1  PHOS  --   --   --   --   --   --  3.4   CBC  Recent Labs Lab 06/27/13 1552  06/29/13 0540 06/30/13 1235 07/01/13 0650 07/02/13 0542  WBC 13.0*  < > 11.7* 9.1 6.3 4.6  NEUTROABS 11.9*  --   --   --   --   --   HGB 13.4  < > 10.3* 10.4* 10.2* 10.8*  HCT 40.1  < > 32.2* 33.6* 32.0* 33.4*  MCV 85.0  < > 85.0 85.9 85.1 84.3  PLT 185  < > 206 210 249 267  < > = values in this interval not displayed.  Medications:    . amLODipine  10 mg Oral Q breakfast  . calcitRIOL  0.5 mcg Oral Q breakfast  . cefTRIAXone (ROCEPHIN)  IV  1 g Intravenous Q24H  . enoxaparin (LOVENOX) injection  30 mg Subcutaneous Q24H  . famotidine  20 mg Oral Daily  . feeding supplement (NEPRO CARB STEADY)  237 mL Oral BID BM  . fentaNYL  50 mcg Transdermal Q3 days  . gabapentin  300 mg Oral QHS  . insulin aspart  0-9 Units Subcutaneous TID WC  . insulin detemir  15 Units Subcutaneous Daily  . ketoconazole  1 application Topical BID  . labetalol  400 mg Oral BID  . latanoprost  1 drop Both Eyes QHS  . levothyroxine  224 mcg Oral QAC breakfast  . mycophenolate  500 mg Oral BID  . potassium chloride  20  mEq Oral Daily  . predniSONE  10 mg Oral Q breakfast  . sirolimus  3 mg Oral Daily    Murtis Sink, MD [facility] Resident, PGY-2 07/03/2013, 8:13 AM

## 2013-07-03 NOTE — Progress Notes (Signed)
Subjective: Sleepy this AM, has not had her breakfast.  Husband in room, still concerned about her confusion, especially with completing tasks like going to the bathroom.  Objective: Vital signs in last 24 hours: Temp:  [98.5 F (36.9 C)-99.3 F (37.4 C)] 99.3 F (37.4 C) (09/13 0618) Pulse Rate:  [72-82] 72 (09/13 0618) Resp:  [18] 18 (09/13 0618) BP: (113-164)/(41-61) 159/52 mmHg (09/13 0618) SpO2:  [95 %-96 %] 95 % (09/13 0618) Weight change:  Last BM Date: 07/02/13  Intake/Output from previous day: 09/12 0701 - 09/13 0700 In: 360 [P.O.:360] Out: -  Intake/Output this shift:    General appearance: alert, cooperative and appears stated age Resp: bronchophony anterior - left Cardio: regular rate and rhythm, S1, S2 normal, no murmur, click, rub or gallop GI: soft, non-tender; bowel sounds normal; no masses,  no organomegaly Extremities: extremities normal, atraumatic, no cyanosis or edema Neurologic: Mental status: Alert, oriented, thought content appropriate, alertness: lethargic  Lab Results:  Recent Labs  07/01/13 0650 07/02/13 0542  WBC 6.3 4.6  HGB 10.2* 10.8*  HCT 32.0* 33.4*  PLT 249 267   BMET  Recent Labs  07/02/13 0542 07/03/13 0548  NA 143 139  K 3.9 4.1  CL 107 104  CO2 27 26  GLUCOSE 99 153*  BUN 23 24*  CREATININE 1.35* 1.38*  CALCIUM 9.1 9.1    Studies/Results: Dg Chest 2 View  07/01/2013   *RADIOLOGY REPORT*  Clinical Data: Productive cough, recent pneumonia  CHEST - 2 VIEW  Comparison: Chest x-ray of 06/28/2013  Findings: The opacities noted in the right midlung, right lung base and left upper lung field recently have cleared.  No focal infiltrate or effusion is seen.  Slightly prominent superior mediastinal structures most likely reflect ectatic great vessels. Cardiomegaly is stable.  The bones are mildly osteopenic.  IMPRESSION: Patchy lung opacities noted previously have cleared.  No focal infiltrate or effusion is seen.  Stable  cardiomegaly.   Original Report Authenticated By: Dwyane Dee, M.D.   Dg Swallowing Func-speech Pathology  07/01/2013   [name], CCC-SLP     07/01/2013  1:59 PM Objective Swallowing Evaluation: Modified Barium Swallowing Study   Patient Details  Name: JAXON MYNHIER MRN: [id number] Date of Birth: April 14, 1941  Today's Date: 07/01/2013 Time: 1300-1330 SLP Time Calculation (min): 30 min  Past Medical History:  Past Medical History  Diagnosis Date  . Diabetes mellitus   . Diabetic neuropathy   . Hypothyroidism   . Hypertension   . GERD (gastroesophageal reflux disease)   . Heart murmur     not treated for  . Arthritis   . Chronic kidney disease     RENAL TRANSPLANT IN 2008--PT IS FOLLOWED BY DR. Karie Fetch  WITH BUN OF 37 AND CREAT 1.7--PER NOTE DR. NESI FROM 04/11/12  . Urethral diverticulum   . Eyesight diminished     GLAUCOMA   . History of shingles     LEFT EYE JAN 2012--STILL HAS EYE PAIN-STATES NOT ABLE TO TAKE  THE MEDICATION FOR SHINGLES BECAUSE OF HER HX OF KIDNEY  TRANSPLANT  . Fingernail abnormalities     FUNGUS OF FINGERNAILS  . UTI (urinary tract infection) 05/18/2013   Past Surgical History:  Past Surgical History  Procedure Laterality Date  . Kidney transplant  2008    At [facility]  . Back surgery      ruptured disk  . Abdominal hysterectomy    . Appendectomy    . Laser surgery of both  eyes for hemorrhages    . 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 Urethral diverticulectomy  04/28/2012    Procedure: URETHRAL DIVERTICULECTOMY;  Surgeon: Antony Haste, MD;  Location: [facility];  Service: Urology;  Laterality:  N/A;   HPI:  Pt is a 72 year old female with chronic kidney disease status  post renal transplant in 2008, receiving temporary dialysis  through a catheter in her left upper extremity, currently on  immunosuppressive therapy and multiple hospitalizations for  recurrent UTI associated with altered mental status. At baseline  she does live at home with her husband, able to transport  herself  with a walker, complicated by some low back discomfort with  history of herniated disc and surgery. She does have some  baseline dementia and was scheduled to be evaluated by neurology  this coming week. She has had multiple enterococcal UTIs, treated  with a variety of antibiotic regimens, with some cultures growing  out multiple pathogens. Recently she has been treated by  nephrology with Macrobid per patient husband report, as soon as  7-10 days ago. However over the last one week, patient has  developed worsening problems with mental status changes, unable  to mobilize her needs and wants, now not ambulating, with nausea  and anorexia. Pt had chest X-ray on 06/28/13 which showed patchy  right sided pulmonary infiltrates and possible component of left  upper lobe pneumonia as well. BSE today to assess swallowing  function.      Assessment / Plan / Recommendation Clinical Impression  Dysphagia Diagnosis: Within Functional Limits Clinical impression: Pt presents with oropharyngeal swallowing  function that is grossly WFL. Although her oral phase is mildly  prolonged with mild oral residue with solids, residuals are  reflexively cleared with a second swallow. Pharyngeal phase  initiates timely at the BOT and is effectively passed through the  pharynx with no residuals. Recommend to resume regular textures  and thin liquids. SLP to f/u briefly to ensure tolerance with  meals and provide education.    Treatment Recommendation  Therapy as outlined in treatment plan below    Diet Recommendation Regular;Thin liquid   Liquid Administration via: Cup;Straw;No straw Medication Administration: Whole meds with liquid Supervision: Patient able to self feed;Full supervision/cueing  for compensatory strategies (okay with husband) Compensations: Slow rate;Small sips/bites Postural Changes and/or Swallow Maneuvers: Seated upright 90  degrees;Upright 30-60 min after meal    Other  Recommendations Oral Care Recommendations:  Oral care BID   Follow Up Recommendations  24 hour supervision/assistance;Home health SLP    Frequency and Duration min 2x/week  2 weeks   Pertinent Vitals/Pain N/A    SLP Swallow Goals Patient will consume recommended diet without observed clinical  signs of aspiration with: Maximal cueing Swallow Study Goal #1 - Progress: Progressing toward goal Patient will utilize recommended strategies during swallow to  increase swallowing safety with: Maximal cueing Swallow Study Goal #2 - Progress: Progressing toward goal   General Date of Onset: 06/29/13 HPI: Pt is a 72 year old female with chronic kidney disease  status post renal transplant in 2008, receiving temporary  dialysis through a catheter in her left upper extremity,  currently on immunosuppressive therapy and multiple  hospitalizations for recurrent UTI associated with altered mental  status. At baseline she does live at home with her husband, able  to transport herself with a walker, complicated by some low back  discomfort with history of herniated disc and surgery. She does  have some baseline dementia  and was scheduled to be evaluated by  neurology this coming week. She has had multiple enterococcal  UTIs, treated with a variety of antibiotic regimens, with some  cultures growing out multiple pathogens. Recently she has been  treated by nephrology with Macrobid per patient husband report,  as soon as 7-10 days ago. However over the last one week, patient  has developed worsening problems with mental status changes,  unable to mobilize her needs and wants, now not ambulating, with  nausea and anorexia. Pt had chest X-ray on 06/28/13 which showed  patchy right sided pulmonary infiltrates and possible component  of left upper lobe pneumonia as well. BSE today to assess  swallowing function.  Type of Study: Modified Barium Swallowing Study Reason for Referral: Objectively evaluate swallowing function Previous Swallow Assessment: N/A Diet Prior to this Study:  Dysphagia 1 (puree);Thin liquids Temperature Spikes Noted: Yes (low grade) Respiratory Status: Room air History of Recent Intubation: No Behavior/Cognition: Confused;Alert;Distractible;Requires cueing Oral Cavity - Dentition: Dentures, top;Dentures, bottom Oral Motor / Sensory Function: Within functional limits Self-Feeding Abilities: Able to feed self;Needs assist Patient Positioning: Upright in chair Baseline Vocal Quality: Clear Anatomy: Within functional limits Pharyngeal Secretions: Not observed secondary MBS    Reason for Referral Objectively evaluate swallowing function   Oral Phase Oral Preparation/Oral Phase Oral Phase: Impaired Oral - Thin Oral - Thin Straw: Holding of bolus Oral - Solids Oral - Puree: Weak lingual manipulation;Lingual/palatal  residue;Delayed oral transit;Other (Comment) (reflexively cleared  with second swallow) Oral - Mechanical Soft: Impaired mastication;Weak lingual  manipulation;Lingual/palatal residue;Delayed oral transit;Other  (Comment) (reflexively cleared with second swallow) Oral - Pill: Within functional limits   Pharyngeal Phase Pharyngeal Phase Pharyngeal Phase: Within functional limits  Cervical Esophageal Phase    GO    Cervical Esophageal Phase Cervical Esophageal Phase: [facility]         [name] 07/01/2013, 1:59 PM  [name], M.A. CCC-SLP [phone]   Medications:  I have reviewed the patient's current medications. Scheduled: . amLODipine  10 mg Oral Q breakfast  . calcitRIOL  0.5 mcg Oral Q breakfast  . cefTRIAXone (ROCEPHIN)  IV  1 g Intravenous Q24H  . enoxaparin (LOVENOX) injection  30 mg Subcutaneous Q24H  . famotidine  20 mg Oral Daily  . feeding supplement (NEPRO CARB STEADY)  237 mL Oral BID BM  . fentaNYL  50 mcg Transdermal Q3 days  . gabapentin  300 mg Oral QHS  . insulin aspart  0-9 Units Subcutaneous TID WC  . insulin detemir  15 Units Subcutaneous Daily  . ketoconazole  1 application Topical BID  . labetalol  400 mg Oral BID   . latanoprost  1 drop Both Eyes QHS  . levothyroxine  224 mcg Oral QAC breakfast  . mycophenolate  500 mg Oral BID  . predniSONE  10 mg Oral Q breakfast  . sirolimus  3 mg Oral Daily   Continuous:  JYN:[id number], acetaminophen  Assessment/Plan: PNEUMONIA: recheck CXR showed good clearing. Continue current antibiotics.  Altered mental status- EEG and CT OK. Making some progress  But slowly  Diabetes mellitus with microvascular complications: FBS good, no lows Continue current regimen  Hypothyroidism-continue home medication regimen. thyroid level is fine   Hypertension-Improved   GERD-continue H2-blocker   No issues with eating when awake.  Chronic kidney disease status post renal transplant now with stage III chronic kidney disease followed by nephrology: [place] looks fine. Cr stable and normalizing with time/fluids.  Underlying dementia :Mixed with delirium from medical  issues.  WIll need SNF, seems to be a barrier for home care.  Gait abnormality with history of back surgery, ruptured disc, on chronic pain medications, using a walker at baseline, no recent falls per husband report   PROTEIN CALORIE MALNUTRITION/ADULT FTT: Alb low at 2.8   Refeeding.  Dysphagia: did well with Barium swallow, go to unrestricted consistency   DISPO:  Family would like her to go to [employer].  Hopefully social work can facilitate this in the next couple of days.     LOS: 6 days   Harlon Kutner W 07/03/2013, 9:03 AM

## 2013-07-04 LAB — CULTURE, BLOOD (ROUTINE X 2)

## 2013-07-04 LAB — BASIC METABOLIC PANEL
BUN: 23 mg/dL (ref 6–23)
Calcium: 9 mg/dL (ref 8.4–10.5)
GFR calc Af Amer: 42 mL/min — ABNORMAL LOW (ref >90)
GFR calc non Af Amer: 36 mL/min — ABNORMAL LOW (ref >90)
Glucose, Bld: 189 mg/dL — ABNORMAL HIGH (ref 70–99)
Potassium: 3.8 mEq/L (ref 3.5–5.1)

## 2013-07-04 LAB — GLUCOSE, CAPILLARY
Glucose-Capillary: 171 mg/dL — ABNORMAL HIGH (ref 70–99)
Glucose-Capillary: 103 mg/dL — ABNORMAL HIGH (ref 70–99)

## 2013-07-04 NOTE — Progress Notes (Signed)
[facility] Progress Note    Assessment/ Plan:   1. ESRD:  - Secondary to DM. 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 Below her baseline cr since admission.  - F/u today's pending labs - Sirolimus level therapeutic at 7.5. - Continue home cellcept 500mg  BID, prednisone 10mg  daily, and sirolimus 3mg  daily - continue Calcitriol 0.9mcg daily   2. Recurrent UTI -  - Abd [place] of allograft - no masses or hydronephrosis,  - GFR<50, macrobid will NOT concentrate into urine and provide no ppx against UTI, recommend stopping.   3. AMS - worsening over 3-4 months, no sz activity on EEG, though consistent with encephalopathy. Neuro planning outpatient followup for evaluation of dementia. As CXR clear and no UTI or other s/s of infection, her mentation is likely chronic and her new baseline.  - EBV pcr negative; also consider PML though would need MRI to eval and neuro has deferred w/u for outpatient  - Regular consistency diet per SLP - renal diet ordered - Continue QHS dosing of neurontin (decreased from TID) for suspected toxicity  - F/u SW recs re: short-term SNF placement - no bed offers [facility] where husband prefers currently  4. Hypokalemia - Resolved. K 4.1 9/13, dc'd [facility] - F/u labs today  5. HTN - Per primary. Norvasc 10 and labetalol 400mg  BID.  - BP still intermittently elevates to 170s systolic but with lowest at 117 systolic, hesitate to be more aggressive.  - labetalol increased to 400mg  BID 9/12, pulse stable   6. Anemia - Saturation ratio >=30 and stable. Hgb stable on 9/12  7. Weight loss: Likely malnourished and cause of chronic metabolic alkalosis. Will continue to monitor.  - continue Nepro BID between meals   8. IDDM with peripheral neuopathy - Per primary.  - Detemir 15u daily and 3 units novolog TID WC  9. Pneumonia - Repeat CXR shows clearance of infiltrates. Afebrile with normalized WBC count and no increased cough. -  Completed 5 days of vanc and rocephin, now on rocephin d 7, blood cultures NGTD - consider de-escalating abx and d/c'ing after total 7 day course - defer to primary team  Subjective:   Husband reports mentation improved this morning but still gets more confused at night. Does not report any other complaints today.   Objective:   BP 164/67  Pulse 75  Temp(Src) 98.9 F (37.2 C) (Oral)  Resp 17  Ht 5' 4.8" (1.646 m)  Wt 139 lb 1.8 oz (63.1 kg)  BMI 23.29 kg/m2  SpO2 94%  Intake/Output Summary (Last 24 hours) at 07/04/13 0753 Last data filed at 07/04/13 0500  Gross per 24 hour  Intake    390 ml  Output      0 ml  Net    390 ml   Weight change:   Physical Exam:   Gen: NAD, awake eating breakfast, alert HEENT: MMM, AT/[place], EOMI, sclera clear, left [place] CV: skin appears well-perfused Resp: nonlabored breathing, normal speech Ext: still with left hand movements, otherwise moves all extremities spontaneously Neuro: fince tremor on BL hands, appropriate conversation, occasional lip puckering, normal speech this morning   Labs: BMET  Recent Labs Lab 06/27/13 1540 06/28/13 0422 06/29/13 0540 06/30/13 1235 07/01/13 0650 07/02/13 0542 07/03/13 0548  NA 142 142 139 137 140 143 139  K 3.9 3.3* 3.8 3.8 3.7 3.9 4.1  CL 97 99 100 100 103 107 104  CO2 34* 34* 31 27 28 27 26   GLUCOSE  197* 50* 195* 329* 171* 99 153*  BUN 33* 34* 34* 28* 26* 23 24*  CREATININE 1.76* 1.75* 1.65* 1.36* 1.27* 1.35* 1.38*  CALCIUM 10.0 8.9 9.1 9.0 9.1 9.1 9.1  PHOS  --   --   --   --   --   --  3.4   CBC  Recent Labs Lab 06/27/13 1552  06/29/13 0540 06/30/13 1235 07/01/13 0650 07/02/13 0542  WBC 13.0*  < > 11.7* 9.1 6.3 4.6  NEUTROABS 11.9*  --   --   --   --   --   HGB 13.4  < > 10.3* 10.4* 10.2* 10.8*  HCT 40.1  < > 32.2* 33.6* 32.0* 33.4*  MCV 85.0  < > 85.0 85.9 85.1 84.3  PLT 185  < > 206 210 249 267  < > = values in this interval not displayed.  Medications:    . amLODipine   10 mg Oral Q breakfast  . calcitRIOL  0.5 mcg Oral Q breakfast  . cefTRIAXone (ROCEPHIN)  IV  1 g Intravenous Q24H  . enoxaparin (LOVENOX) injection  30 mg Subcutaneous Q24H  . famotidine  20 mg Oral Daily  . feeding supplement (NEPRO CARB STEADY)  237 mL Oral BID BM  . fentaNYL  50 mcg Transdermal Q3 days  . gabapentin  300 mg Oral QHS  . insulin aspart  0-9 Units Subcutaneous TID WC  . insulin detemir  15 Units Subcutaneous Daily  . ketoconazole  1 application Topical BID  . labetalol  400 mg Oral BID  . latanoprost  1 drop Both Eyes QHS  . levothyroxine  224 mcg Oral QAC breakfast  . mycophenolate  500 mg Oral BID  . predniSONE  10 mg Oral Q breakfast  . sirolimus  3 mg Oral Daily   Leona Singleton, MD  [facility] Family Medicine Resident, PGY-2 07/04/2013, 7:53 AM

## 2013-07-04 NOTE — Progress Notes (Signed)
Notified IV team about pt needing a new PIV - current PIV dated for 06/27/13. IV team RN stated the patient was on their list and they would address it soon as they could. PIV continues to flush without difficulty. Will monitor.  Kathlene November, [facility] [place]

## 2013-07-04 NOTE — Progress Notes (Signed)
Subjective: Alert and awake.  She is not eating a lot and doesn't like her diet.  Had been advanced per orders to carb modified, but still receiving renal diet.  Otherwise uneventful 24 hours.  Objective: Vital signs in last 24 hours: Temp:  [98.5 F (36.9 C)-98.9 F (37.2 C)] 98.9 F (37.2 C) (09/14 0623) Pulse Rate:  [70-75] 75 (09/14 0623) Resp:  [17-18] 17 (09/14 0623) BP: (117-178)/(53-67) 164/67 mmHg (09/14 0623) SpO2:  [94 %-97 %] 94 % (09/14 0623) Weight:  [63.1 kg (139 lb 1.8 oz)] 63.1 kg (139 lb 1.8 oz) (09/14 7829) Weight change:  Last BM Date: 07/03/13  Intake/Output from previous day: 09/13 0701 - 09/14 0700 In: 390 [P.O.:340; IV Piggyback:50] Out: -  Intake/Output this shift:   General appearance: alert, cooperative and appears stated age  Resp: bronchophony anterior - left  Cardio: regular rate and rhythm, S1, S2 normal, no murmur, click, rub or gallop  GI: soft, non-tender; bowel sounds normal; no masses, no organomegaly  Extremities: extremities normal, atraumatic, no cyanosis or edema  Neurologic: Mental status: Alert, oriented, thought content appropriate, alertness: lethargic     Lab Results:  Recent Labs  07/02/13 0542  WBC 4.6  HGB 10.8*  HCT 33.4*  PLT 267   BMET  Recent Labs  07/03/13 0548 07/04/13 0500  NA 139 141  K 4.1 3.8  CL 104 106  CO2 26 26  GLUCOSE 153* 189*  BUN 24* 23  CREATININE 1.38* 1.40*  CALCIUM 9.1 9.0    Studies/Results: No results found.  Medications:  I have reviewed the patient's current medications. Scheduled: . amLODipine  10 mg Oral Q breakfast  . calcitRIOL  0.5 mcg Oral Q breakfast  . cefTRIAXone (ROCEPHIN)  IV  1 g Intravenous Q24H  . enoxaparin (LOVENOX) injection  30 mg Subcutaneous Q24H  . famotidine  20 mg Oral Daily  . feeding supplement (NEPRO CARB STEADY)  237 mL Oral BID BM  . fentaNYL  50 mcg Transdermal Q3 days  . gabapentin  300 mg Oral QHS  . insulin aspart  0-9 Units Subcutaneous  TID WC  . insulin detemir  15 Units Subcutaneous Daily  . ketoconazole  1 application Topical BID  . labetalol  400 mg Oral BID  . latanoprost  1 drop Both Eyes QHS  . levothyroxine  224 mcg Oral QAC breakfast  . mycophenolate  500 mg Oral BID  . predniSONE  10 mg Oral Q breakfast  . sirolimus  3 mg Oral Daily   Continuous:  FAO:ZHYQMVHQIONGE, acetaminophen  Assessment/Plan: PNEUMONIA: recheck CXR showed good clearing. Continue current antibiotics.  Altered mental status- EEG and CT OK. Making some progress But slowly  Diabetes mellitus with microvascular complications: FBS good, no lows Continue current regimen  Hypothyroidism-continue home medication regimen. thyroid level is fine  Hypertension-Improved  GERD-continue H2-blocker No issues with eating when awake.  Chronic kidney disease status post renal transplant now with stage III chronic kidney disease followed by nephrology: [place] looks fine. Cr stable and normalizing with time/fluids. Off renal diet as no potassium issues. Underlying dementia :Mixed with delirium from medical issues. WIll need SNF, seems to be a barrier for home care.  Gait abnormality with history of back surgery, ruptured disc, on chronic pain medications, using a walker at baseline, no recent falls per husband report  PROTEIN CALORIE MALNUTRITION/ADULT FTT: Alb low at 2.8 Refeeding.  Dysphagia: did well with Barium swallow, go to unrestricted consistency  DISPO: Family would like her  to go to [facility]. Hopefully social work can facilitate this in the next couple of days.  As of now, she does not have a bed offer from that facility.   LOS: 7 days   TISOVEC,RICHARD W 07/04/2013, 8:30 AM

## 2013-07-04 NOTE — Progress Notes (Signed)
I have personally seen and examined this patient and agree with the assessment/plan as outlined above by Benjamin Stain MD (PGY2). Slowly improving neurological symptoms with stable renal allograft function. Nothing further to add to active management by the primary team---will sign off at this time- please call if we can assist further in [name]'s care. [name] K.,MD 07/04/2013 11:07 AM

## 2013-07-05 LAB — GLUCOSE, CAPILLARY
Glucose-Capillary: 81 mg/dL (ref 70–99)
Glucose-Capillary: 129 mg/dL — ABNORMAL HIGH (ref 70–99)

## 2013-07-05 LAB — RENAL FUNCTION PANEL
Albumin: 2.8 g/dL — ABNORMAL LOW (ref 3.5–5.2)
BUN: 21 mg/dL (ref 6–23)
CO2: 28 mEq/L (ref 19–32)
Calcium: 9.1 mg/dL (ref 8.4–10.5)
Chloride: 107 mEq/L (ref 96–112)
Creatinine, Ser: 1.41 mg/dL — ABNORMAL HIGH (ref 0.50–1.10)
GFR calc Af Amer: 41 mL/min — ABNORMAL LOW (ref >90)
GFR calc non Af Amer: 36 mL/min — ABNORMAL LOW (ref >90)
Glucose, Bld: 85 mg/dL (ref 70–99)
Phosphorus: 4.1 mg/dL (ref 2.3–4.6)
Potassium: 3.9 mEq/L (ref 3.5–5.1)
Sodium: 143 mEq/L (ref 135–145)

## 2013-07-05 MED ORDER — ONDANSETRON HCL 4 MG/2ML IJ SOLN
4.0000 mg | Freq: Four times a day (QID) | INTRAMUSCULAR | Status: DC | PRN
  Filled 2013-07-05: qty 2

## 2013-07-05 MED ORDER — INSULIN DETEMIR 100 UNIT/ML ~~LOC~~ SOLN: 15.0000 [IU] | Freq: Every day | SUBCUTANEOUS | Status: DC

## 2013-07-05 MED ORDER — NEPRO/CARBSTEADY PO LIQD: 237.0000 mL | Freq: Two times a day (BID) | ORAL | Status: DC

## 2013-07-05 NOTE — Discharge Summary (Signed)
DISCHARGE SUMMARY  [name]  MR#: [id number]  DOB:1940/11/13  Date of Admission: 06/27/2013 Date of Discharge: 07/05/2013  Attending Physician:[name]  Patient's RJJ:OACZY,SAYTKZS Erika Diener, MD  Consults:  renal and neurology  Discharge Diagnoses: PNEUMONIA: , community acquired, now completed Rx with clearing of XR  Altered mental status-persistant Diabetes mellitus with microvascular complications:  stable Hypothyroidism-clinically and biochem euthyroid Hypertension- stable GERD-continue H2-blocker   Chronic kidney disease status post renal transplant now with stage III chronic kidney disease stable Underlying dementia :Mixed with delirium from medical issues.   Gait abnormality with history of back surgery, ruptured disc, on chronic pain medications, using a walker at baseline, no recent falls per husband report  PROTEIN CALORIE MALNUTRITION/ADULT FTT: Alb low at 2.8   Dysphagia:, passed barium swallow    Discharge Medications:   Medication List         amLODipine 10 MG tablet  Commonly known as:  NORVASC  Take 1 tablet (10 mg total) by mouth daily with breakfast.     calcitRIOL 0.5 MCG capsule  Commonly known as:  ROCALTROL  Take 1 capsule (0.5 mcg total) by mouth daily with breakfast.     feeding supplement (NEPRO CARB STEADY) Liqd  Take 237 mLs by mouth 2 (two) times daily between meals.     fentaNYL 50 MCG/HR  Commonly known as:  DURAGESIC - dosed mcg/hr  Place 1 patch onto the skin every 3 (three) days.     furosemide 40 MG tablet  Commonly known as:  LASIX  Take 80 mg by mouth 2 (two) times daily.     gabapentin 300 MG capsule  Commonly known as:  NEURONTIN  Take 300 mg by mouth 2 (two) times daily.     insulin detemir 100 UNIT/ML injection  Commonly known as:  LEVEMIR  Inject 0.15 mLs (15 Units total) into the skin daily with breakfast.     insulin lispro 100 UNIT/ML injection  Commonly known as:  HUMALOG  Inject 2-4 Units into the skin  3 (three) times daily before meals.     ketoconazole 2 % cream  Commonly known as:  NIZORAL  Apply 1 application topically 2 (two) times daily.     labetalol 200 MG tablet  Commonly known as:  NORMODYNE  Take 1.5 tablets (300 mg total) by mouth 3 (three) times daily. Takes 1 and 1/2 tabs     latanoprost 0.005 % ophthalmic solution  Commonly known as:  XALATAN  Place 1 drop into both eyes at bedtime.     levothyroxine 112 MCG tablet  Commonly known as:  SYNTHROID, LEVOTHROID  Take 2 tablets (224 mcg total) by mouth daily with breakfast.     mycophenolate 250 MG capsule  Commonly known as:  CELLCEPT  Take 2 capsules (500 mg total) by mouth 2 (two) times daily.     potassium chloride SA 20 MEQ tablet  Commonly known as:  K-DUR,KLOR-CON  Take 1 tablet (20 mEq total) by mouth daily.     predniSONE 5 MG tablet  Commonly known as:  DELTASONE  Take 2 tablets (10 mg total) by mouth daily with breakfast.     ranitidine 150 MG tablet  Commonly known as:  ZANTAC  Take 1 tablet (150 mg total) by mouth daily.     sirolimus 1 MG tablet  Commonly known as:  RAPAMUNE  Take 3 tablets (3 mg total) by mouth daily. Pt takes three 1 mg tablets = 3 mg daily  urea 40 % Crea  Commonly known as:  CARMOL  Apply 1 application topically 2 (two) times daily. Apply to finger nails, and cuticles.       Past Medical History:  Past Medical History   Diagnosis  Date   .  Diabetes mellitus    .  Diabetic neuropathy    .  Hypothyroidism    .  Hypertension    .  GERD (gastroesophageal reflux disease)    .  Heart murmur      not treated for   .  Arthritis    .  Chronic kidney disease      RENAL TRANSPLANT IN 2008--PT IS FOLLOWED BY DR. Karie Fetch WITH BUN OF 37 AND CREAT 1.7--PER NOTE DR. NESI FROM 04/11/12   .  Urethral diverticulum    .  Eyesight diminished      GLAUCOMA   .  History of shingles      LEFT EYE JAN 2012--STILL HAS EYE PAIN-STATES NOT ABLE TO TAKE THE MEDICATION FOR  SHINGLES BECAUSE OF HER HX OF KIDNEY TRANSPLANT   .  Fingernail abnormalities      FUNGUS OF FINGERNAILS   .  UTI (urinary tract infection)  05/18/2013    Past Surgical History   Procedure  Laterality  Date   .  Kidney transplant   2008     At [facility]   .  Back surgery       ruptured disk   .  Abdominal hysterectomy     .  Appendectomy     .  Laser surgery of both eyes for hemorrhages     .  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  Urethral diverticulectomy   04/28/2012     Procedure: URETHRAL DIVERTICULECTOMY; Surgeon: Antony Haste, MD; Location: [facility]; Service: Urology; Laterality: N/A;       Hospital Procedures: Dg Chest 2 View  07/01/2013   *RADIOLOGY REPORT*  Clinical Data: Productive cough, recent pneumonia  CHEST - 2 VIEW  Comparison: Chest x-ray of 06/28/2013  Findings: The opacities noted in the right midlung, right lung base and left upper lung field recently have cleared.  No focal infiltrate or effusion is seen.  Slightly prominent superior mediastinal structures most likely reflect ectatic great vessels. Cardiomegaly is stable.  The bones are mildly osteopenic.  IMPRESSION: Patchy lung opacities noted previously have cleared.  No focal infiltrate or effusion is seen.  Stable cardiomegaly.   Original Report Authenticated By: Dwyane Dee, M.D.   X-ray Chest Pa And Lateral   06/28/2013   *RADIOLOGY REPORT*  Clinical Data: Pneumonia.  CHEST - 2 VIEW  Comparison: 06/27/2013  Findings: Patchy infiltrates are present in the right upper and lower lung zones.  There may also be subtle infiltrate in the left upper lung.  No evidence of overt edema or pleural fluid.  There is stable cardiomegaly.  IMPRESSION: Patchy right sided pulmonary infiltrates and possible component of left upper lobe pneumonia as well.   Original Report Authenticated By: Irish Lack, M.D.   Ct Head Wo Contrast  06/27/2013   CLINICAL DATA:  Altered mental status. Diabetes. Emesis.  EXAM: CT HEAD  WITHOUT CONTRAST  TECHNIQUE: Contiguous axial images were obtained from the base of the skull through the vertex without intravenous contrast.  COMPARISON:  05/21/2013  FINDINGS: The brainstem, cerebellum, cerebral peduncles, thalamus, basal ganglia, basilar cisterns, and ventricular system appear within normal limits. Periventricular white matter and corona radiata hypodensities favor  chronic ischemic microvascular white matter disease. No intracranial hemorrhage, mass lesion, or acute CVA.  Atherosclerotic calcification of the carotid siphons noted.  IMPRESSION: 1. No acute intracranial findings. 2. Chronic ischemic microvascular white matter disease.   Electronically Signed   By: Herbie Baltimore   On: 06/27/2013 17:38   US Renal Transplant W/doppler  06/29/2013   *RADIOLOGY REPORT*  Clinical Data:  Evaluate renal transplant.  ULTRASOUND OF RENAL TRANSPLANT  Technique: Ultrasound examination of the renal transplant was performed, with color and duplex Doppler evaluation.  Comparison:  09/27/2010  Findings:  Transplant kidney location:  Right lower quadrant  Transplant kidney description:  Normal in size and parenchymal echogenicity. No evidence of mass or hydronephrosis. Transplant measures 8.0 cm in length.  Color flow in the main renal artery at the hilum:  Present  Color flow in the main renal vein at the hilum:  Present  Resistive indices:       Main renal artery:  0.89       Upper pole segmental renal artery: 0.82       Lower pole segmental renal artery:  0.80  Bladder:  Normal appearance.  IMPRESSION: Normal appearance of the renal transplant without hydronephrosis.  The resistive indices within the transplant kidney are slightly elevated.  Recommend correlation with clinical history and comparison with any previous Doppler imaging.   Original Report Authenticated By: Richarda Overlie, M.D.   Dg Chest Port 1 View  06/27/2013   *RADIOLOGY REPORT*  Clinical Data: Altered mental status.  Vomiting.  PORTABLE CHEST  - 1 VIEW  Comparison: 06/01/2013  Findings: Chin overlies the apices. Cardiomegaly accentuated by AP portable technique.  No pleural fluid.  No definite pneumothorax.  Right infrahilar airspace disease.  Minimal left base volume loss.  IMPRESSION: Right infrahilar airspace disease, suspicious for infection or aspiration.  Cardiomegaly without congestive failure.   Original Report Authenticated By: Jeronimo Greaves, M.D.   Dg Swallowing Func-speech Pathology  07/01/2013   [name], CCC-SLP     07/01/2013  1:59 PM Objective Swallowing Evaluation: Modified Barium Swallowing Study   Patient Details  Name: [name] MRN: [id number] Date of Birth: Jul 12, 1941  Today's Date: 07/01/2013 Time: 1300-1330 SLP Time Calculation (min): 30 min  Past Medical History:  Past Medical History  Diagnosis Date  . Diabetes mellitus   . Diabetic neuropathy   . Hypothyroidism   . Hypertension   . GERD (gastroesophageal reflux disease)   . Heart murmur     not treated for  . Arthritis   . Chronic kidney disease     RENAL TRANSPLANT IN 2008--PT IS FOLLOWED BY DR. Karie Fetch  WITH BUN OF 37 AND CREAT 1.7--PER NOTE DR. NESI FROM 04/11/12  . Urethral diverticulum   . Eyesight diminished     GLAUCOMA   . History of shingles     LEFT EYE JAN 2012--STILL HAS EYE PAIN-STATES NOT ABLE TO TAKE  THE MEDICATION FOR SHINGLES BECAUSE OF HER HX OF KIDNEY  TRANSPLANT  . Fingernail abnormalities     FUNGUS OF FINGERNAILS  . UTI (urinary tract infection) 05/18/2013   Past Surgical History:  Past Surgical History  Procedure Laterality Date  . Kidney transplant  2008    At [facility]  . Back surgery      ruptured disk  . Abdominal hysterectomy    . Appendectomy    . Laser surgery of both eyes for hemorrhages    . 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  Urethral diverticulectomy  04/28/2012    Procedure: URETHRAL DIVERTICULECTOMY;  Surgeon: Antony Haste, MD;  Location: [facility];  Service: Urology;  Laterality:  N/A;   HPI:  Pt is a 72 year old  female with chronic kidney disease status  post renal transplant in 2008, receiving temporary dialysis  through a catheter in her left upper extremity, currently on  immunosuppressive therapy and multiple hospitalizations for  recurrent UTI associated with altered mental status. At baseline  she does live at home with her husband, able to transport herself  with a walker, complicated by some low back discomfort with  history of herniated disc and surgery. She does have some  baseline dementia and was scheduled to be evaluated by neurology  this coming week. She has had multiple enterococcal UTIs, treated  with a variety of antibiotic regimens, with some cultures growing  out multiple pathogens. Recently she has been treated by  nephrology with Macrobid per patient husband report, as soon as  7-10 days ago. However over the last one week, patient has  developed worsening problems with mental status changes, unable  to mobilize her needs and wants, now not ambulating, with nausea  and anorexia. Pt had chest X-ray on 06/28/13 which showed patchy  right sided pulmonary infiltrates and possible component of left  upper lobe pneumonia as well. BSE today to assess swallowing  function.      Assessment / Plan / Recommendation Clinical Impression  Dysphagia Diagnosis: Within Functional Limits Clinical impression: Pt presents with oropharyngeal swallowing  function that is grossly WFL. Although her oral phase is mildly  prolonged with mild oral residue with solids, residuals are  reflexively cleared with a second swallow. Pharyngeal phase  initiates timely at the BOT and is effectively passed through the  pharynx with no residuals. Recommend to resume regular textures  and thin liquids. SLP to f/u briefly to ensure tolerance with  meals and provide education.    Treatment Recommendation  Therapy as outlined in treatment plan below    Diet Recommendation Regular;Thin liquid   Liquid Administration via: Cup;Straw;No straw  Medication Administration: Whole meds with liquid Supervision: Patient able to self feed;Full supervision/cueing  for compensatory strategies (okay with husband) Compensations: Slow rate;Small sips/bites Postural Changes and/or Swallow Maneuvers: Seated upright 90  degrees;Upright 30-60 min after meal    Other  Recommendations Oral Care Recommendations: Oral care BID   Follow Up Recommendations  24 hour supervision/assistance;Home health SLP    Frequency and Duration min 2x/week  2 weeks   Pertinent Vitals/Pain N/A    SLP Swallow Goals Patient will consume recommended diet without observed clinical  signs of aspiration with: Maximal cueing Swallow Study Goal #1 - Progress: Progressing toward goal Patient will utilize recommended strategies during swallow to  increase swallowing safety with: Maximal cueing Swallow Study Goal #2 - Progress: Progressing toward goal   General Date of Onset: 06/29/13 HPI: Pt is a 72 year old female with chronic kidney disease  status post renal transplant in 2008, receiving temporary  dialysis through a catheter in her left upper extremity,  currently on immunosuppressive therapy and multiple  hospitalizations for recurrent UTI associated with altered mental  status. At baseline she does live at home with her husband, able  to transport herself with a walker, complicated by some low back  discomfort with history of herniated disc and surgery. She does  have some baseline dementia and was scheduled to be evaluated by  neurology this coming week. She has had multiple  enterococcal  UTIs, treated with a variety of antibiotic regimens, with some  cultures growing out multiple pathogens. Recently she has been  treated by nephrology with Macrobid per patient husband report,  as soon as 7-10 days ago. However over the last one week, patient  has developed worsening problems with mental status changes,  unable to mobilize her needs and wants, now not ambulating, with  nausea and anorexia. Pt had  chest X-ray on 06/28/13 which showed  patchy right sided pulmonary infiltrates and possible component  of left upper lobe pneumonia as well. BSE today to assess  swallowing function.  Type of Study: Modified Barium Swallowing Study Reason for Referral: Objectively evaluate swallowing function Previous Swallow Assessment: N/A Diet Prior to this Study: Dysphagia 1 (puree);Thin liquids Temperature Spikes Noted: Yes (low grade) Respiratory Status: Room air History of Recent Intubation: No Behavior/Cognition: Confused;Alert;Distractible;Requires cueing Oral Cavity - Dentition: Dentures, top;Dentures, bottom Oral Motor / Sensory Function: Within functional limits Self-Feeding Abilities: Able to feed self;Needs assist Patient Positioning: Upright in chair Baseline Vocal Quality: Clear Anatomy: Within functional limits Pharyngeal Secretions: Not observed secondary MBS    Reason for Referral Objectively evaluate swallowing function   Oral Phase Oral Preparation/Oral Phase Oral Phase: Impaired Oral - Thin Oral - Thin Straw: Holding of bolus Oral - Solids Oral - Puree: Weak lingual manipulation;Lingual/palatal  residue;Delayed oral transit;Other (Comment) (reflexively cleared  with second swallow) Oral - Mechanical Soft: Impaired mastication;Weak lingual  manipulation;Lingual/palatal residue;Delayed oral transit;Other  (Comment) (reflexively cleared with second swallow) Oral - Pill: Within functional limits   Pharyngeal Phase Pharyngeal Phase Pharyngeal Phase: Within functional limits  Cervical Esophageal Phase    GO    Cervical Esophageal Phase Cervical Esophageal Phase: [employer]         [name] 07/01/2013, 1:59 PM  [name], M.A. CCC-SLP [phone]   History of Present Illness: Fever and confusion  Hospital Course: This is a 72 year old black female well-known to me from my private practice. She has a multitude of chronic medical issues including a renal transplant with renal insufficiency, diabetes, and  recurrent UTIs. She's been admitted several times in the last 2 years with infectious issues. The last few admissions been characterized by significant encephalopathy that is only partially cleared. It is suspected that she has underlying dementia. She is admitted this time with pneumonia and was patchy and bilateral. She was treated with Rocephin and vancomycin has had total clearing of her x-ray. She had extensive swallowing evaluation and did well with this and is on regular consistency at the present time. Her mental status however has continued to decline. Neurology did see her and did a CT and EEG both of which were normal. They recommended outpatient dementia workup. Although she has frequent UTIs this did not seem to be part of this presentation. The nephrology team helped during this stay as well. An ultrasound suggested that the graft was functioning well. Her creatinine has stabilized at about 1.4 which is slightly better than her baseline. Her hypertension is been under good control. Her diabetes is done well but has required roughly 50% less insulin than prehospital. She's not had a significant lows while here. She is very deconditioned and has difficulty getting around. Her oral intake has been fair. Her pain is under control. Her volume status is stable. Her TSH was fine during this admission. Current disposition plans are to go to skilled facility for physical and occupational therapy. I suspect this may be a longer-term need rather than just  rehabilitation. Her CODE STATUS here as been full, she's on a carb modified diet  Day of Discharge Exam BP 163/70  Pulse 71  Temp(Src) 97.7 F (36.5 C) (Oral)  Resp 17  Ht 5' 4.8" (1.646 m)  Wt 63.1 kg (139 lb 1.8 oz)  BMI 23.29 kg/m2  SpO2 96%  Physical Exam: General appearance: Left periorbital edema area of sclera anicteric. Eyes are conjugate. No nystagmus is present. Oral membranes are moist. Resp: Clear with no wheezes rales or rhonchi no  accessory muscles are in use Cardio: Regular with systolic murmur GI: soft, non-tender; bowel sounds normal; no masses, well-healed scars Extremities: Slightly diminished pulses with trace edema Neuro: The patient is awake. She recognizes me. Her mentation is slow. Speech is clear but slow. Some tremor of the left hand is noted. Skin is dry without breakdown  Discharge Labs:  Recent Labs  07/03/13 0548 07/04/13 0500 07/05/13 0640  NA 139 141 143  K 4.1 3.8 3.9  CL 104 106 107  CO2 26 26 28   GLUCOSE 153* 189* 85  BUN 24* 23 21  CREATININE 1.38* 1.40* 1.41*  CALCIUM 9.1 9.0 9.1  MG 2.1  --   --   PHOS 3.4  --  4.1  Results for [name], [name] (MRN [id number]) as of 07/05/2013 08:45  Ref. Range 07/01/2013 06:50 07/02/2013 05:42  WBC Latest Range: 4.0-10.5 K/uL 6.3 4.6  RBC Latest Range: 3.87-5.11 MIL/uL 3.76 (L) 3.96  Hemoglobin Latest Range: 12.0-15.0 g/dL 78.2 (L) 95.6 (L)  HCT Latest Range: 36.0-46.0 % 32.0 (L) 33.4 (L)  MCV Latest Range: 78.0-100.0 fL 85.1 84.3  MCH Latest Range: 26.0-34.0 pg 27.1 27.3  MCHC Latest Range: 30.0-36.0 g/dL 21.3 08.6  RDW Latest Range: 11.5-15.5 % 16.7 (H) 16.8 (H)  Platelets Latest Range: 150-400 K/uL 249 267    Recent Labs  07/03/13 0548 07/05/13 0640  ALBUMIN 2.6* 2.8*   Results for [name], [name] (MRN [id number]) as of 07/05/2013 08:45  Ref. Range 06/28/2013 04:22  TSH Latest Range: 0.350-4.500 uIU/mL 0.473   EKG 06/27/13: Sinus rhythm Multiple ventricular premature complexes Borderline left axis deviation Anterior infarct, old Borderline repolarization abnormality  Results for [name], [name] (MRN [id number]) as of 07/05/2013 08:45  Ref. Range 06/29/2013 05:40 06/30/2013 22:30  Vancomycin Tr Latest Range: 10.0-20.0 ug/mL  15.4  Sirolimus (Rapamycin) Latest Range: 3.0-18.0 mcg/L 7.5      Discharge instructions:  Monitor sugars before meals and at bed. Correction insulin probably should be given post-meals do 2 unreliable  eating.   Disposition: To skilled facility  Follow-up Appts: Follow-up with Dr. Evlyn Kanner at [facility] nursing [facility] discharge  Call for appointment.  Condition on Discharge: Fair but improved  Tests Needing Follow-up: None Signed: Bethel Gaglio Russo 07/05/2013, 8:34 AM

## 2013-07-05 NOTE — Progress Notes (Signed)
Physical Therapy Treatment Patient Details Name: [name] MRN: [id number] DOB: 26-Jul-1941 Today's Date: 07/05/2013 Time: 1011-1026 PT Time Calculation (min): 15 min  PT Assessment / Plan / Recommendation  History of Present Illness  72 year old female with chronic kidney disease status post renal transplant in 2008, receiving temporary dialysis through a catheter in her left upper extremity, currently on immunosuppressive therapy and multiple hospitalizations for recurrent UTI associated with altered mental status. At baseline she does live at home with her husband, able to transport himself with a walker, complicated by some low back discomfort with history of herniated disc and surgery. She does have some baseline dementia and was scheduled to be evaluated by neurology this coming week. She has had multiple enterococcal UTIs, treated with a variety of antibiotic regimens, with some cultures growing out multiple pathogens. Recently she has been treated by nephrology with Macrobid per patient husband report, as soon as 7-10 days ago. However over the last one week, patient has developed worsening problems with mental status changes, unable to mobilize her needs and wants, now not ambulating, with nausea and anorexia.    PT Comments   Patient able to transfer into the recliner this session. Patients husband present and states he was "hug" lifting her for transfers at home. Patient has declined with mobility. Husband is looking at potential SNFs at this time. Continue with current POC  Follow Up Recommendations  SNF     Does the patient have the potential to tolerate intense rehabilitation     Barriers to Discharge        Equipment Recommendations  None recommended by PT    Recommendations for Other Services    Frequency Min 2X/week   Progress towards PT Goals Progress towards PT goals: Progressing toward goals  Plan Current plan remains appropriate    Precautions / Restrictions  Precautions Precautions: Fall   Pertinent Vitals/Pain no apparent distress    Mobility  Bed Mobility Supine to Sit: 2: Max assist;HOB elevated Sit to Supine: 2: Max assist Details for Bed Mobility Assistance: verbal and tactile cues for assist however pt with poor initiation and required increased assist Transfers Transfers: Squat Pivot Transfers Sit to Stand: 1: +2 Total assist Sit to Stand: Patient Percentage: 50% Stand to Sit: 1: +2 Total assist Stand to Sit: Patient Percentage: 50% Squat Pivot Transfers: 1: +2 Total assist Squat Pivot Transfers: Patient Percentage: 30% Details for Transfer Assistance: A for all aspect of transfer. Patient with poor initiation but attempt to A with LEs with transfer.     Exercises     PT Diagnosis:    PT Problem List:   PT Treatment Interventions:     PT Goals (current goals can now be found in the care plan section)    Visit Information  Last PT Received On: 07/05/13 Assistance Needed: +2 History of Present Illness:  72 year old female with chronic kidney disease status post renal transplant in 2008, receiving temporary dialysis through a catheter in her left upper extremity, currently on immunosuppressive therapy and multiple hospitalizations for recurrent UTI associated with altered mental status. At baseline she does live at home with her husband, able to transport himself with a walker, complicated by some low back discomfort with history of herniated disc and surgery. She does have some baseline dementia and was scheduled to be evaluated by neurology this coming week. She has had multiple enterococcal UTIs, treated with a variety of antibiotic regimens, with some cultures growing out multiple pathogens. Recently she has  been treated by nephrology with Macrobid per patient husband report, as soon as 7-10 days ago. However over the last one week, patient has developed worsening problems with mental status changes, unable to mobilize her needs  and wants, now not ambulating, with nausea and anorexia.     Subjective Data      Cognition  Cognition Arousal/Alertness: Awake/alert Behavior During Therapy: WFL for tasks assessed/performed Overall Cognitive Status: History of cognitive impairments - at baseline Memory: Decreased short-term memory    Balance  Static Sitting Balance Static Sitting - Balance Support: Feet supported;No upper extremity supported Static Sitting - Level of Assistance: 5: Stand by assistance Static Sitting - Comment/# of Minutes: 5  End of Session PT - End of Session Equipment Utilized During Treatment: Gait belt Activity Tolerance: Patient limited by fatigue Patient left: with call bell/phone within reach;with nursing/sitter in room;in chair   GP     Robinette, Adline Potter 07/05/2013, 1:06 PM 07/05/2013 Fredrich Birks PTA [phone] pager [phone] office

## 2013-07-05 NOTE — Progress Notes (Signed)
NURSING PROGRESS NOTE  [name] [id number] Discharge Data: 07/05/2013 3:48 PM Attending Provider: Julian Hy, MD NWG:[id number],[id number] [name], MD     [name] to be D/C'd Skilled nursing facility per MD order.  Discussed with the patient the After Visit Summary and all questions fully answered. All IV's discontinued with no bleeding noted. All belongings returned to patient for patient to take home.   Last Vital Signs:  Blood pressure 163/70, pulse 71, temperature 97.7 F (36.5 C), temperature source Oral, resp. rate 17, height 5' 4.8" (1.646 m), weight 63.1 kg (139 lb 1.8 oz), SpO2 96.00%.  Discharge Medication List   Medication List         amLODipine 10 MG tablet  Commonly known as:  NORVASC  Take 1 tablet (10 mg total) by mouth daily with breakfast.     calcitRIOL 0.5 MCG capsule  Commonly known as:  ROCALTROL  Take 1 capsule (0.5 mcg total) by mouth daily with breakfast.     feeding supplement (NEPRO CARB STEADY) Liqd  Take 237 mLs by mouth 2 (two) times daily between meals.     fentaNYL 50 MCG/HR  Commonly known as:  DURAGESIC - dosed mcg/hr  Place 1 patch onto the skin every 3 (three) days.     furosemide 40 MG tablet  Commonly known as:  LASIX  Take 80 mg by mouth 2 (two) times daily.     gabapentin 300 MG capsule  Commonly known as:  NEURONTIN  Take 300 mg by mouth 2 (two) times daily.     insulin detemir 100 UNIT/ML injection  Commonly known as:  LEVEMIR  Inject 0.15 mLs (15 Units total) into the skin daily with breakfast.     insulin lispro 100 UNIT/ML injection  Commonly known as:  HUMALOG  Inject 2-4 Units into the skin 3 (three) times daily before meals.     ketoconazole 2 % cream  Commonly known as:  NIZORAL  Apply 1 application topically 2 (two) times daily.     labetalol 200 MG tablet  Commonly known as:  NORMODYNE  Take 1.5 tablets (300 mg total) by mouth 3 (three) times daily. Takes 1 and 1/2 tabs     latanoprost 0.005 % ophthalmic  solution  Commonly known as:  XALATAN  Place 1 drop into both eyes at bedtime.     levothyroxine 112 MCG tablet  Commonly known as:  SYNTHROID, LEVOTHROID  Take 2 tablets (224 mcg total) by mouth daily with breakfast.     mycophenolate 250 MG capsule  Commonly known as:  CELLCEPT  Take 2 capsules (500 mg total) by mouth 2 (two) times daily.     potassium chloride SA 20 MEQ tablet  Commonly known as:  K-DUR,KLOR-CON  Take 1 tablet (20 mEq total) by mouth daily.     predniSONE 5 MG tablet  Commonly known as:  DELTASONE  Take 2 tablets (10 mg total) by mouth daily with breakfast.     ranitidine 150 MG tablet  Commonly known as:  ZANTAC  Take 1 tablet (150 mg total) by mouth daily.     sirolimus 1 MG tablet  Commonly known as:  RAPAMUNE  Take 3 tablets (3 mg total) by mouth daily. Pt takes three 1 mg tablets = 3 mg daily     urea 40 % Crea  Commonly known as:  CARMOL  Apply 1 application topically 2 (two) times daily. Apply to finger nails, and cuticles.

## 2013-07-05 NOTE — Clinical Social Work Placement (Signed)
Clinical Social Work Department CLINICAL SOCIAL WORK PLACEMENT NOTE 07/05/2013  Patient:  [name], [name]  Account Number:  [id number] Admit date:  06/27/2013  Clinical Social Worker:  Carren Rang  Date/time:  06/30/2013 04:12 PM  Clinical Social Work is seeking post-discharge placement for this patient at the following level of care:   SKILLED NURSING   (*CSW will update this form in Epic as items are completed)     Patient/family provided with [facility] Department of Clinical Social Work's list of facilities offering this level of care within the geographic area requested by the patient (or if unable, by the patient's family).    Patient/family informed of their freedom to choose among providers that offer the needed level of care, that participate in Medicare, Medicaid or managed care program needed by the patient, have an available bed and are willing to accept the patient.  06/30/2013  Patient/family informed of [facility]' ownership interest in [facility], as well as of the fact that they are under no obligation to receive care at this facility.  PASARR submitted to EDS on 06/30/2013 PASARR number received from EDS on 06/30/2013  FL2 transmitted to all facilities in geographic area requested by pt/family on  06/30/2013 FL2 transmitted to all facilities within larger geographic area on   Patient informed that his/her managed care company has contracts with or will negotiate with  certain facilities, including the following:     Patient/family informed of bed offers received:  07/01/2013 Patient chooses bed at [facility] Physician recommends and patient chooses bed at    Patient to be transferred to [facility] on  07/05/2013 Patient to be transferred to facility by Ambulance  The following physician request were entered in Epic:   Additional Comments: Per MD patient ready for DC 07/05/13. Patient DC to  [facility] 07/05/13. Patient, husband, nurse, and facility notified of DC. Husband completed paperwork at facility. Patient transported to facility by ambulance. CSW signing off.   Roddie Mc, [place], [place], [phone]

## 2013-07-05 NOTE — Progress Notes (Signed)
Speech Language Pathology Dysphagia Treatment Patient Details Name: [name] MRN: [id number] DOB: Dec 26, 1940 Today's Date: 07/05/2013 Time: 1520-1530 SLP Time Calculation (min): 10 min  Assessment / Plan / Recommendation Clinical Impression  Pt seen for f/u education after completion of MBS 9/11, which revealed oropharyngeal swallow function WFL. Today pt is awaiting d/c from hospital and declines POs, so treatment focused on pt/family education. SLP reiterated results of MBS along with standard aspiration precautions. Pt and husband both verbalized their understanding of the information presented. Pt does not need further f/u for swallowing.    Diet Recommendation  Continue with Current Diet: Regular;Thin liquid    SLP Plan All goals met   Pertinent Vitals/Pain N/A   Swallowing Goals  SLP Swallowing Goals Patient will consume recommended diet without observed clinical signs of aspiration with: Maximal cueing Swallow Study Goal #1 - Progress: Met Patient will utilize recommended strategies during swallow to increase swallowing safety with: Maximal cueing Swallow Study Goal #2 - Progress: Met  General Temperature Spikes Noted: No Respiratory Status: Room air Behavior/Cognition: Confused;Alert;Distractible;Requires cueing Oral Cavity - Dentition: Dentures, top;Dentures, bottom Patient Positioning: Other (comment) (sitting EOB)  Oral Cavity - Oral Hygiene Does patient have any of the following "at risk" factors?: None of the above Brush patient's teeth BID with toothbrush (using toothpaste with fluoride): Yes   Dysphagia Treatment Treatment focused on: Patient/family/caregiver education Family/Caregiver Educated: husband [name]) Patient observed directly with PO's: No Reason PO's not observed: Refused   GO     [name] 07/05/2013, 3:26 PM   [name], M.A. CCC-SLP [phone]

## 2013-08-10 ENCOUNTER — Emergency Department (HOSPITAL_COMMUNITY): Payer: Medicare Other

## 2013-08-10 ENCOUNTER — Inpatient Hospital Stay (HOSPITAL_COMMUNITY)
Admission: EM | Admit: 2013-08-10 | Discharge: 2013-08-17 | DRG: 097 | Disposition: A | Discharge status: Home / Self Care | Payer: Medicare Other | Attending: Endocrinology | Admitting: Endocrinology

## 2013-08-10 ENCOUNTER — Encounter (HOSPITAL_COMMUNITY): Payer: Self-pay | Admitting: Emergency Medicine

## 2013-08-10 DIAGNOSIS — G0490 Encephalitis and encephalomyelitis, unspecified: Secondary | ICD-10-CM

## 2013-08-10 DIAGNOSIS — E1142 Type 2 diabetes mellitus with diabetic polyneuropathy: Secondary | ICD-10-CM | POA: Diagnosis present

## 2013-08-10 DIAGNOSIS — M549 Dorsalgia, unspecified: Secondary | ICD-10-CM | POA: Diagnosis present

## 2013-08-10 DIAGNOSIS — E43 Unspecified severe protein-calorie malnutrition: Secondary | ICD-10-CM | POA: Diagnosis present

## 2013-08-10 DIAGNOSIS — Z940 Kidney transplant status: Secondary | ICD-10-CM

## 2013-08-10 DIAGNOSIS — D61818 Other pancytopenia: Secondary | ICD-10-CM | POA: Diagnosis not present

## 2013-08-10 DIAGNOSIS — N179 Acute kidney failure, unspecified: Secondary | ICD-10-CM | POA: Diagnosis present

## 2013-08-10 DIAGNOSIS — N183 Chronic kidney disease, stage 3 unspecified: Secondary | ICD-10-CM | POA: Diagnosis present

## 2013-08-10 DIAGNOSIS — E039 Hypothyroidism, unspecified: Secondary | ICD-10-CM | POA: Diagnosis present

## 2013-08-10 DIAGNOSIS — E1149 Type 2 diabetes mellitus with other diabetic neurological complication: Secondary | ICD-10-CM | POA: Diagnosis present

## 2013-08-10 DIAGNOSIS — K219 Gastro-esophageal reflux disease without esophagitis: Secondary | ICD-10-CM | POA: Diagnosis present

## 2013-08-10 DIAGNOSIS — I10 Essential (primary) hypertension: Secondary | ICD-10-CM | POA: Diagnosis present

## 2013-08-10 DIAGNOSIS — Z794 Long term (current) use of insulin: Secondary | ICD-10-CM

## 2013-08-10 DIAGNOSIS — A89 Unspecified viral infection of central nervous system: Principal | ICD-10-CM | POA: Diagnosis present

## 2013-08-10 DIAGNOSIS — E876 Hypokalemia: Secondary | ICD-10-CM | POA: Diagnosis not present

## 2013-08-10 DIAGNOSIS — M109 Gout, unspecified: Secondary | ICD-10-CM | POA: Diagnosis present

## 2013-08-10 DIAGNOSIS — R4182 Altered mental status, unspecified: Secondary | ICD-10-CM

## 2013-08-10 DIAGNOSIS — R531 Weakness: Secondary | ICD-10-CM | POA: Diagnosis present

## 2013-08-10 DIAGNOSIS — H409 Unspecified glaucoma: Secondary | ICD-10-CM | POA: Diagnosis present

## 2013-08-10 DIAGNOSIS — Z79899 Other long term (current) drug therapy: Secondary | ICD-10-CM

## 2013-08-10 DIAGNOSIS — E1165 Type 2 diabetes mellitus with hyperglycemia: Secondary | ICD-10-CM | POA: Diagnosis present

## 2013-08-10 DIAGNOSIS — I129 Hypertensive chronic kidney disease with stage 1 through stage 4 chronic kidney disease, or unspecified chronic kidney disease: Secondary | ICD-10-CM | POA: Diagnosis present

## 2013-08-10 DIAGNOSIS — IMO0002 Reserved for concepts with insufficient information to code with codable children: Secondary | ICD-10-CM

## 2013-08-10 DIAGNOSIS — G9341 Metabolic encephalopathy: Secondary | ICD-10-CM | POA: Diagnosis present

## 2013-08-10 DIAGNOSIS — D899 Disorder involving the immune mechanism, unspecified: Secondary | ICD-10-CM | POA: Diagnosis present

## 2013-08-10 DIAGNOSIS — G8929 Other chronic pain: Secondary | ICD-10-CM | POA: Diagnosis present

## 2013-08-10 DIAGNOSIS — M129 Arthropathy, unspecified: Secondary | ICD-10-CM | POA: Diagnosis present

## 2013-08-10 DIAGNOSIS — Z87891 Personal history of nicotine dependence: Secondary | ICD-10-CM

## 2013-08-10 DIAGNOSIS — F0391 Unspecified dementia with behavioral disturbance: Secondary | ICD-10-CM | POA: Diagnosis present

## 2013-08-10 DIAGNOSIS — R197 Diarrhea, unspecified: Secondary | ICD-10-CM | POA: Diagnosis not present

## 2013-08-10 DIAGNOSIS — F0390 Unspecified dementia without behavioral disturbance: Secondary | ICD-10-CM | POA: Diagnosis present

## 2013-08-10 DIAGNOSIS — N189 Chronic kidney disease, unspecified: Secondary | ICD-10-CM | POA: Diagnosis present

## 2013-08-10 DIAGNOSIS — Z880 Allergy status to penicillin: Secondary | ICD-10-CM

## 2013-08-10 LAB — URINALYSIS, ROUTINE W REFLEX MICROSCOPIC
Bilirubin Urine: NEGATIVE
Glucose, UA: NEGATIVE mg/dL
Ketones, ur: NEGATIVE mg/dL
Protein, ur: NEGATIVE mg/dL
Urobilinogen, UA: 0.2 mg/dL (ref 0.0–1.0)
pH: 5 (ref 5.0–8.0)

## 2013-08-10 LAB — GRAM STAIN

## 2013-08-10 LAB — GLUCOSE, CAPILLARY
Glucose-Capillary: 254 mg/dL — ABNORMAL HIGH (ref 70–99)
Glucose-Capillary: 366 mg/dL — ABNORMAL HIGH (ref 70–99)
Glucose-Capillary: 571 mg/dL (ref 70–99)
Glucose-Capillary: <10 mg/dL — CL (ref 70–99)

## 2013-08-10 LAB — COMPREHENSIVE METABOLIC PANEL
AST: 15 U/L (ref 0–37)
Albumin: 3.7 g/dL (ref 3.5–5.2)
Alkaline Phosphatase: 42 U/L (ref 39–117)
BUN: 51 mg/dL — ABNORMAL HIGH (ref 6–23)
CO2: 32 mEq/L (ref 19–32)
Chloride: 100 mEq/L (ref 96–112)
Potassium: 3.9 mEq/L (ref 3.5–5.1)
Total Bilirubin: 0.6 mg/dL (ref 0.3–1.2)
Total Protein: 6.7 g/dL (ref 6.0–8.3)

## 2013-08-10 LAB — URINE MICROSCOPIC-ADD ON

## 2013-08-10 LAB — CG4 I-STAT (LACTIC ACID): Lactic Acid, Venous: 1.01 mmol/L (ref 0.5–2.2)

## 2013-08-10 LAB — CBC WITH DIFFERENTIAL/PLATELET
Eosinophils Absolute: 0 10*3/uL (ref 0.0–0.7)
Eosinophils Relative: 1 % (ref 0–5)
HCT: 34 % — ABNORMAL LOW (ref 36.0–46.0)
Lymphs Abs: 0.4 10*3/uL — ABNORMAL LOW (ref 0.7–4.0)
MCH: 27.4 pg (ref 26.0–34.0)
MCV: 86.3 fL (ref 78.0–100.0)
Monocytes Absolute: 0.2 10*3/uL (ref 0.1–1.0)
Monocytes Relative: 6 % (ref 3–12)
Platelets: 158 10*3/uL (ref 150–400)
RBC: 3.94 MIL/uL (ref 3.87–5.11)
RDW: 17.2 % — ABNORMAL HIGH (ref 11.5–15.5)

## 2013-08-10 MED ORDER — DEXTROSE 50 % IV SOLN
1.0000 | Freq: Once | INTRAVENOUS | Status: AC
  Administered 2013-08-10: 50 mL via INTRAVENOUS

## 2013-08-10 MED ORDER — DEXTROSE 50 % IV SOLN
INTRAVENOUS | Status: AC
  Administered 2013-08-10: 50 mL via INTRAVENOUS
  Filled 2013-08-10: qty 100

## 2013-08-10 MED ORDER — DEXTROSE 50 % IV SOLN
INTRAVENOUS | Status: AC
  Administered 2013-08-10: 50 mL via INTRAVENOUS
  Filled 2013-08-10: qty 50

## 2013-08-10 MED ORDER — SODIUM CHLORIDE 0.9 % IV SOLN
Freq: Once | INTRAVENOUS | Status: AC
  Administered 2013-08-10: 21:00:00 via INTRAVENOUS

## 2013-08-10 MED ORDER — DEXTROSE 5 % IV SOLN: 2.0000 g | Freq: Once | INTRAVENOUS | Status: DC

## 2013-08-10 MED ORDER — DEXTROSE-NACL 5-0.9 % IV SOLN
INTRAVENOUS | Status: DC
  Administered 2013-08-10: 22:00:00 via INTRAVENOUS

## 2013-08-10 MED ORDER — MIDAZOLAM HCL 2 MG/2ML IJ SOLN
2.0000 mg | Freq: Once | INTRAMUSCULAR | Status: AC
  Administered 2013-08-10: 2 mg via INTRAVENOUS
  Filled 2013-08-10: qty 2

## 2013-08-10 MED ORDER — SODIUM CHLORIDE 0.9 % IV BOLUS (SEPSIS)
1000.0000 mL | Freq: Once | INTRAVENOUS | Status: AC
  Administered 2013-08-10: 1000 mL via INTRAVENOUS

## 2013-08-10 MED ORDER — GLUCAGON HCL (RDNA) 1 MG IJ SOLR
INTRAMUSCULAR | Status: AC
  Filled 2013-08-10: qty 1

## 2013-08-10 NOTE — ED Notes (Signed)
Pt returned from radiology from floro guided LP- CBG checked and found to be <10, Dr. Waynard Edwards at bedside- instructed to give 3 amps of D50 and reassess CBG every 15 minutes until midnight and then every hour.

## 2013-08-10 NOTE — H&P (Signed)
[name] is an 72 y.o. female.   Chief Complaint: altered Mental status HPI: Achol is a chronically ill female with multiple medical problems as listed below.  She was admitted in September 2014 with altered mental status and pneumonia.   She was discharged to SNF and subsequently sent home one week ago.  She has had significant issues with a dementia like picture for the last 6-9 months at least.   She can normal help with transfers and be helped to a bedside commode.   She normally cannot finish sentences well but she can answer  Yes or no to her husband depending on her needs.   Today she was much more somnolent and her husband could not arouse her well. She then developed vomiting at home.  EMS was called and she was evaluated in the ER.  ER evaluation has not shown a new source of infection but empiric antibiotics have been started.  A lumbar puncture has also been performed in interventional radiology but only two tubes of fluid were obtained.  She remains very somnolent and difficult to arouse.  She will require admission.  Past Medical History  Diagnosis Date  . Diabetes mellitus   . Diabetic neuropathy   . Hypothyroidism   . Hypertension   . GERD (gastroesophageal reflux disease)   . Heart murmur     not treated for  . Arthritis   . Chronic kidney disease     RENAL TRANSPLANT IN 2008--PT IS FOLLOWED BY DR. Karie Fetch WITH BUN OF 37 AND CREAT 1.7--PER NOTE DR. NESI FROM 04/11/12  . Urethral diverticulum   . Eyesight diminished     GLAUCOMA   . History of shingles     LEFT EYE JAN 2012--STILL HAS EYE PAIN-STATES NOT ABLE TO TAKE THE MEDICATION FOR SHINGLES BECAUSE OF HER HX OF KIDNEY TRANSPLANT  . Fingernail abnormalities     FUNGUS OF FINGERNAILS  . UTI (urinary tract infection) 05/18/2013    Past Surgical History  Procedure Laterality Date  . Kidney transplant  2008    At [facility]  . Back surgery      ruptured disk  . Abdominal hysterectomy    . Appendectomy     . Laser surgery of both eyes for hemorrhages    . 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 Urethral diverticulectomy  04/28/2012    Procedure: URETHRAL DIVERTICULECTOMY;  Surgeon: Antony Haste, MD;  Location: [facility] ORS;  Service: Urology;  Laterality: N/A;    Family History  Problem Relation Age of Onset  . Anesthesia problems Neg Hx    Social History:  reports that she quit smoking about 34 years ago. She has never used smokeless tobacco. She reports that she does not drink alcohol or use illicit drugs.  Allergies:  Allergies  Allergen Reactions  . Adhesive [Tape]     Skin irritation, "pulls the skin off"   . Aspirin     Crawling feeling under skin  . Hydrocodone Other (See Comments)    Hallucinations   . Iohexol      Desc: pt unable to tell what the reaction was   . Oxycodone     hallucinations  . Penicillins Hives and Swelling    Has tolerated cefazolin and ceftriaxone.  . Tacrolimus Other (See Comments)    Hallucinations   . Contrast Media [Iodinated Diagnostic Agents] Hives, Swelling and Rash     (Not in a hospital admission)  Results for orders placed during  the hospital encounter of 08/10/13 (from the past 48 hour(s))  COMPREHENSIVE METABOLIC PANEL     Status: Abnormal   Collection Time    08/10/13  6:23 PM      Result Value Range   Sodium 143  135 - 145 mEq/L   Potassium 3.9  3.5 - 5.1 mEq/L   Chloride 100  96 - 112 mEq/L   CO2 32  19 - 32 mEq/L   Glucose, Bld 116 (*) 70 - 99 mg/dL   BUN 51 (*) 6 - 23 mg/dL   Creatinine, Ser 1.61 (*) 0.50 - 1.10 mg/dL   Calcium 9.8  8.4 - 09.6 mg/dL   Total Protein 6.7  6.0 - 8.3 g/dL   Albumin 3.7  3.5 - 5.2 g/dL   AST 15  0 - 37 U/L   ALT 10  0 - 35 U/L   Alkaline Phosphatase 42  39 - 117 U/L   Total Bilirubin 0.6  0.3 - 1.2 mg/dL   GFR calc non Af Amer 17 (*) >90 mL/min   GFR calc Af Amer 20 (*) >90 mL/min   Comment: (NOTE)     The eGFR has been calculated using the CKD EPI equation.     This calculation  has not been validated in all clinical situations.     eGFR's persistently <90 mL/min signify possible Chronic Kidney     Disease.  CBC WITH DIFFERENTIAL     Status: Abnormal   Collection Time    08/10/13  6:23 PM      Result Value Range   WBC 3.6 (*) 4.0 - 10.5 K/uL   RBC 3.94  3.87 - 5.11 MIL/uL   Hemoglobin 10.8 (*) 12.0 - 15.0 g/dL   HCT 04.5 (*) 40.9 - 81.1 %   MCV 86.3  78.0 - 100.0 fL   MCH 27.4  26.0 - 34.0 pg   MCHC 31.8  30.0 - 36.0 g/dL   RDW 91.4 (*) 78.2 - 95.6 %   Platelets 158  150 - 400 K/uL   Neutrophils Relative % 83 (*) 43 - 77 %   Neutro Abs 3.0  1.7 - 7.7 K/uL   Lymphocytes Relative 10 (*) 12 - 46 %   Lymphs Abs 0.4 (*) 0.7 - 4.0 K/uL   Monocytes Relative 6  3 - 12 %   Monocytes Absolute 0.2  0.1 - 1.0 K/uL   Eosinophils Relative 1  0 - 5 %   Eosinophils Absolute 0.0  0.0 - 0.7 K/uL   Basophils Relative 0  0 - 1 %   Basophils Absolute 0.0  0.0 - 0.1 K/uL  POCT I-STAT TROPONIN I     Status: None   Collection Time    08/10/13  6:36 PM      Result Value Range   Troponin i, poc 0.00  0.00 - 0.08 ng/mL   Comment 3            Comment: Due to the release kinetics of cTnI,     a negative result within the first hours     of the onset of symptoms does not rule out     myocardial infarction with certainty.     If myocardial infarction is still suspected,     repeat the test at appropriate intervals.  CG4 I-STAT (LACTIC ACID)     Status: None   Collection Time    08/10/13  6:37 PM      Result Value Range  Lactic Acid, Venous 1.01  0.5 - 2.2 mmol/L  URINALYSIS, ROUTINE W REFLEX MICROSCOPIC     Status: Abnormal   Collection Time    08/10/13  7:26 PM      Result Value Range   Color, Urine YELLOW  YELLOW   APPearance CLEAR  CLEAR   Specific Gravity, Urine 1.012  1.005 - 1.030   pH 5.0  5.0 - 8.0   Glucose, UA NEGATIVE  NEGATIVE mg/dL   Hgb urine dipstick TRACE (*) NEGATIVE   Bilirubin Urine NEGATIVE  NEGATIVE   Ketones, ur NEGATIVE  NEGATIVE mg/dL    Protein, ur NEGATIVE  NEGATIVE mg/dL   Urobilinogen, UA 0.2  0.0 - 1.0 mg/dL   Nitrite NEGATIVE  NEGATIVE   Leukocytes, UA TRACE (*) NEGATIVE  URINE MICROSCOPIC-ADD ON     Status: Abnormal   Collection Time    08/10/13  7:26 PM      Result Value Range   Squamous Epithelial / LPF FEW (*) RARE   WBC, UA 3-6  <3 WBC/hpf   RBC / HPF 0-2  <3 RBC/hpf   Bacteria, UA FEW (*) RARE   Casts HYALINE CASTS (*) NEGATIVE   Dg Chest 2 View  08/10/2013   CLINICAL DATA:  Weakness.  EXAM: CHEST  2 VIEW  COMPARISON:  07/01/2013  FINDINGS: Two views of the chest demonstrate low lung volumes. Slightly prominent right central vascular structures are unchanged. No focal airspace disease. Heart size is stable. Patient is rotated towards the left.  IMPRESSION: Low lung volumes without focal disease.   Electronically Signed   By: Richarda Overlie M.D.   On: 08/10/2013 19:05   Ct Head Wo Contrast  08/10/2013   CLINICAL DATA:  Altered mental status, weakness  EXAM: CT HEAD WITHOUT CONTRAST  TECHNIQUE: Contiguous axial images were obtained from the base of the skull through the vertex without intravenous contrast.  COMPARISON:  Head CT 06/27/2013, 05/21/2013  FINDINGS: No acute intracranial hemorrhage. No focal mass lesion. No CT evidence of acute infarction. No midline shift or mass effect. No hydrocephalus. Basilar cisterns are patent. Mild atrophy and microvascular disease unchanged.  Paranasal sinuses and mastoid air cells are clear.  IMPRESSION: 1. No acute intracranial findings.  2. Unchanged from multiple head CT comparisons.   Electronically Signed   By: Genevive Bi M.D.   On: 08/10/2013 19:16   HOme meds:  Ketoconazole cream to finger tips bid Fentanyl patch  12 mcg and 50 mcg patch, changed every 4 days Labetalol 200 mg 1 and 1/2 pill tid xalatan eye drops 0/005% one gtt ou qhs Amlodipine 10 mg po daily Furosemide 40 mg po bid rapamune 1 mg 3 tabs each evening Prednisone 10 mg each a.m. cellcept 250 mg 2  pills po bid klorcon 20 meq two pills daily rocaltrol 0.5  Mcg one daily Gabapentin 300 mg po bid Zantac 150 mg po daily Synthroid 112 mcg 2 daily levemir 18 units qam humalog ssi usually ends up giving it twice a day Allopurinol 150 mg po daily crestor 40 mg daily     ZOX:WRUEAVW is obtained from the husband.  Blood pressure 123/65, pulse 70, temperature 98.5 F (36.9 C), temperature source Oral, resp. rate 17, SpO2 99.00%.  Patient is completely obtunded.   Lungs are cta bilat. No w/r/r.  Heart is rrr no m/r/g.  abd soft nt/nd/no mass or hsm  She responds to sternal rub.  No c/c/e  Assessment/Plan 72 yo  Female with altered mental  status and vomiting.   No clear cause of her initial symptoms have been found.   Upon my arrival she was very sedated.  A cbg was checked and read "low"  So we urgently gave her 3 amps of d50 and then a f/up sugar check within 5 minutes read 574  Then 5 minutes later she was 366 and then 10 minutes later 299-so she probably was low and we will start d5ns at 100 /hour but i suspect she was not less than 10 as the initial "low" reading stated.  She did not require glucagon.  So, i suspect the initial "low" may have been an error.  Her husband states that she is prone to lows at times but no severe lows recently to his knowledge.  At this point we will admit her stepdown. We will monitor her sugars every 15 minutes for the next hour and then every hour for the following 4-6 hours.  We will empirically cover her with rocephin and monitor her and hydrate her.   Her husband wants for her to be a full code status.  Unfortunately I suspect  A lot of her chronic mental issues may not be reversible at this point.    Ezequiel Kayser, MD 08/10/2013, 10:59 PM

## 2013-08-10 NOTE — ED Provider Notes (Signed)
CSN: [id number]     Arrival date & time 08/10/13  1613 History   First MD Initiated Contact with Patient 08/10/13 1710     Chief Complaint  Patient presents with  . Weakness   (Consider location/radiation/quality/duration/timing/severity/associated sxs/prior Treatment) Patient is a 72 y.o. female presenting with weakness.  Weakness Associated symptoms include weakness.   History is obtained from the patients husband.  Patient is a 72 yo female with a history of DM, recurrent UTI, dementia, CKD s/p renal transplant who presents with onset of altered mental status earlier today.  Husband notes the patient has been in and out of the hospital and SNF over the last several months. She has been hospitalized for PNA and UTI during this time. Has been home since Tuesday and had been doing well until this morning when he noted that she was not able appropriately answer questions or relate stuff back to him. He would try to wake her up and she would go back to sleep. She improved some in the middle of the day and was able to eat. Around 2-3 pm the husband noted worsening mental status with the patient not wanting to walk and not following commands.  Past Medical History  Diagnosis Date  . Diabetes mellitus   . Diabetic neuropathy   . Hypothyroidism   . Hypertension   . GERD (gastroesophageal reflux disease)   . Heart murmur     not treated for  . Arthritis   . Chronic kidney disease     RENAL TRANSPLANT IN 2008--PT IS FOLLOWED BY DR. Karie Fetch WITH BUN OF 37 AND CREAT 1.7--PER NOTE DR. NESI FROM 04/11/12  . Urethral diverticulum   . Eyesight diminished     GLAUCOMA   . History of shingles     LEFT EYE JAN 2012--STILL HAS EYE PAIN-STATES NOT ABLE TO TAKE THE MEDICATION FOR SHINGLES BECAUSE OF HER HX OF KIDNEY TRANSPLANT  . Fingernail abnormalities     FUNGUS OF FINGERNAILS  . UTI (urinary tract infection) 05/18/2013   Past Surgical History  Procedure Laterality Date  . Kidney  transplant  2008    At [facility]  . Back surgery      ruptured disk  . Abdominal hysterectomy    . Appendectomy    . Laser surgery of both eyes for hemorrhages    . 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 Urethral diverticulectomy  04/28/2012    Procedure: URETHRAL DIVERTICULECTOMY;  Surgeon: Antony Haste, MD;  Location: [facility];  Service: Urology;  Laterality: N/A;   Family History  Problem Relation Age of Onset  . Anesthesia problems Neg Hx    History  Substance Use Topics  . Smoking status: Former Smoker -- 0.50 packs/day for 20 years    Quit date: 10/21/1978  . Smokeless tobacco: Never Used  . Alcohol Use: No   OB History   Grav Para Term Preterm Abortions TAB SAB Ect Mult Living                 Review of Systems  Neurological: Positive for weakness.  Unable to obtain adequate ROS given patients mental status.  Allergies  Adhesive; Aspirin; Hydrocodone; Iohexol; Oxycodone; Penicillins; Tacrolimus; and Contrast media  Home Medications   Current Outpatient Rx  Name  Route  Sig  Dispense  Refill  . allopurinol (ZYLOPRIM) 300 MG tablet   Oral   Take 150 mg by mouth daily.         Marland Kitchen amLODipine (  NORVASC) 10 MG tablet   Oral   Take 1 tablet (10 mg total) by mouth daily with breakfast.   30 tablet   0   . aspirin-acetaminophen-caffeine (EXCEDRIN MIGRAINE) 250-250-65 MG per tablet   Oral   Take 2 tablets by mouth every 6 (six) hours as needed for pain.         . calcitRIOL (ROCALTROL) 0.5 MCG capsule   Oral   Take 1 capsule (0.5 mcg total) by mouth daily with breakfast.   30 capsule   0   . fentaNYL (DURAGESIC - DOSED MCG/HR) 50 MCG/HR   Transdermal   Place 1 patch onto the skin See admin instructions. Every 3 to 4 days         . furosemide (LASIX) 40 MG tablet   Oral   Take 80 mg by mouth 2 (two) times daily.         Marland Kitchen gabapentin (NEURONTIN) 300 MG capsule   Oral   Take 300 mg by mouth 2 (two) times daily.         . insulin  detemir (LEVEMIR) 100 UNIT/ML injection   Subcutaneous   Inject 16 Units into the skin daily.         . insulin lispro (HUMALOG) 100 UNIT/ML injection   Subcutaneous   Inject 2-4 Units into the skin 3 (three) times daily before meals. Per sliding scale         . labetalol (NORMODYNE) 200 MG tablet   Oral   Take 1.5 tablets (300 mg total) by mouth 3 (three) times daily. Takes 1 and 1/2 tabs   120 tablet   0   . latanoprost (XALATAN) 0.005 % ophthalmic solution   Both Eyes   Place 1 drop into both eyes at bedtime.         Marland Kitchen levothyroxine (SYNTHROID, LEVOTHROID) 112 MCG tablet   Oral   Take 2 tablets (224 mcg total) by mouth daily with breakfast.   60 tablet   0   . mycophenolate (CELLCEPT) 250 MG capsule   Oral   Take 2 capsules (500 mg total) by mouth 2 (two) times daily.   120 capsule   0   . potassium chloride SA (K-DUR,KLOR-CON) 20 MEQ tablet   Oral   Take 1 tablet (20 mEq total) by mouth daily.   14 tablet   0   . predniSONE (DELTASONE) 5 MG tablet   Oral   Take 2 tablets (10 mg total) by mouth daily with breakfast.   60 tablet   0   . ranitidine (ZANTAC) 150 MG tablet   Oral   Take 1 tablet (150 mg total) by mouth daily.   30 tablet   0   . sirolimus (RAPAMUNE) 1 MG tablet   Oral   Take 3 tablets (3 mg total) by mouth daily. Pt takes three 1 mg tablets = 3 mg daily   90 tablet   0    BP 129/54  Pulse 70  Temp(Src) 98.5 F (36.9 C) (Oral)  Resp 17  SpO2 99% Physical Exam  Constitutional: No distress.  [place]  HENT:  Head: Normocephalic and atraumatic.  Mouth/Throat: Oropharynx is clear and moist.  Eyes: Pupils are equal, round, and reactive to light.  Neck: Neck supple.  Cardiovascular: Normal rate, regular rhythm and normal heart sounds.   Pulmonary/Chest: Effort normal and breath sounds normal. No respiratory distress. She has no wheezes. She has no rales.  Abdominal: Soft. She exhibits no distension.  There is no tenderness. There is  no rebound and no guarding.  Musculoskeletal: She exhibits no edema.  Neurological: She is alert.  Left eye ptosis, left mouth droop, neuro exam difficult to obtain though CN 5, 7, 9,10, 12 intact, 5/5 strength in biceps, triceps, and grip, and plantar flexion, otherwise patient with difficulty cooperating with the exam, somnolent  Skin: Skin is warm and dry. She is not diaphoretic.  No pressure ulcers    ED Course  Procedures (including critical care time) Labs Review Labs Reviewed  COMPREHENSIVE METABOLIC PANEL - Abnormal; Notable for the following:    Glucose, Bld 116 (*)    BUN 51 (*)    Creatinine, Ser 2.62 (*)    GFR calc non Af Amer 17 (*)    GFR calc Af Amer 20 (*)    All other components within normal limits  CBC WITH DIFFERENTIAL - Abnormal; Notable for the following:    WBC 3.6 (*)    Hemoglobin 10.8 (*)    HCT 34.0 (*)    RDW 17.2 (*)    Neutrophils Relative % 83 (*)    Lymphocytes Relative 10 (*)    Lymphs Abs 0.4 (*)    All other components within normal limits  URINALYSIS, ROUTINE W REFLEX MICROSCOPIC - Abnormal; Notable for the following:    Hgb urine dipstick TRACE (*)    Leukocytes, UA TRACE (*)    All other components within normal limits  URINE MICROSCOPIC-ADD ON - Abnormal; Notable for the following:    Squamous Epithelial / LPF FEW (*)    Bacteria, UA FEW (*)    Casts HYALINE CASTS (*)    All other components within normal limits  GLUCOSE, CAPILLARY - Abnormal; Notable for the following:    Glucose-Capillary <10 (*)    All other components within normal limits  URINE CULTURE  CSF CULTURE  GRAM STAIN  CSF CELL COUNT WITH DIFFERENTIAL  CSF CELL COUNT WITH DIFFERENTIAL  GLUCOSE, CSF  PROTEIN, CSF  POCT I-STAT TROPONIN I  CG4 I-STAT (LACTIC ACID)   Imaging Review Dg Chest 2 View  08/10/2013   CLINICAL DATA:  Weakness.  EXAM: CHEST  2 VIEW  COMPARISON:  07/01/2013  FINDINGS: Two views of the chest demonstrate low lung volumes. Slightly prominent  right central vascular structures are unchanged. No focal airspace disease. Heart size is stable. Patient is rotated towards the left.  IMPRESSION: Low lung volumes without focal disease.   Electronically Signed   By: Richarda Overlie M.D.   On: 08/10/2013 19:05   Ct Head Wo Contrast  08/10/2013   CLINICAL DATA:  Altered mental status, weakness  EXAM: CT HEAD WITHOUT CONTRAST  TECHNIQUE: Contiguous axial images were obtained from the base of the skull through the vertex without intravenous contrast.  COMPARISON:  Head CT 06/27/2013, 05/21/2013  FINDINGS: No acute intracranial hemorrhage. No focal mass lesion. No CT evidence of acute infarction. No midline shift or mass effect. No hydrocephalus. Basilar cisterns are patent. Mild atrophy and microvascular disease unchanged.  Paranasal sinuses and mastoid air cells are clear.  IMPRESSION: 1. No acute intracranial findings.  2. Unchanged from multiple head CT comparisons.   Electronically Signed   By: Genevive Bi M.D.   On: 08/10/2013 19:16    EKG Interpretation   None         Date: 08/10/2013  Rate: 69  Rhythm: normal sinus rhythm  QRS Axis: normal  Intervals: normal  ST/T Wave abnormalities: V5 and V6  previously inverted, now upright  Conduction Disutrbances:none  Old EKG Reviewed: changes noted  LP procedure note  Date: 08/10/2013 Time: 10:35 PM Indication: altered mental status  Verbal consent was obtained from the patient. The procedure was described to the patient and risks and benefits of the procedure was described to the patient. The patient was placed on her right side in the fetal position. The area was cleaned and draped in usual sterile fashion. Lidocaine was used as anesthetic. A spinal needle was placed in the L4-L5 interspace without return of CSF. The patient was repositioned to the seated position and area was cleaned again. A spinal needle was again attempted to be placed in the L4-L5 interspace without return of CSF. No  CSF was obtained. Pressure was held to the needle insertion site. The site was cleaned with an alcohol swab and a band-aid was placed.  Estimated blood loss: minimal The patient tolerated the procedure well and there were no complications.      MDM   1. Altered mental status   2. History of renal transplant   3. Chronic kidney disease, stage III (moderate)    6:00 pm: patient seen and examined. Patient with onset of altered mental status starting today. No complaints to husband prior to onset of altered mental status. Differential is broad with concern for stroke, encephalitis, subdural hematoma, worsening dementia, MI/ACS, PNA, UTI, sepsis, DKA, uremia. Will obtain CBC with diff, CMET, UA, UCx, CXR, troponin, lactate, CT head.  8:15 pm: all labs have returned with cause for decreased mental status. Will plan to do an LP at this time to evaluate for meningitis or encephalitis.  10:20 pm: LP at bedside unsuccessful. Will order IR fluoro guided LP. Will start ceftriaxone.  I spoke with [facility] physician on call who states will come see the patient for admission.  11:15 pm: Dr Waynard Edwards is in with the patient at this time.   This patient was discussed and seen with my attending Dr Rhunette Croft.  Marikay Alar, MD [facility] PGY-2 08/10/13 11:16 pm  Glori Luis, MD 08/11/13 [phone]

## 2013-08-10 NOTE — ED Notes (Signed)
Pt to ED via [facility] for evaluation of weakness and not acting herself per family.  Family reports pt has had decreased activity and appetite today- pt was treated for pneumonia a month ago.  Pt has had right kidney transplant in the past.  Pt denies any complaints in the past.

## 2013-08-11 ENCOUNTER — Encounter (HOSPITAL_COMMUNITY): Payer: Self-pay | Admitting: *Deleted

## 2013-08-11 DIAGNOSIS — G0490 Encephalitis and encephalomyelitis, unspecified: Secondary | ICD-10-CM | POA: Diagnosis present

## 2013-08-11 DIAGNOSIS — R4182 Altered mental status, unspecified: Secondary | ICD-10-CM

## 2013-08-11 DIAGNOSIS — D849 Immunodeficiency, unspecified: Secondary | ICD-10-CM | POA: Diagnosis present

## 2013-08-11 LAB — COMPREHENSIVE METABOLIC PANEL
AST: 16 U/L (ref 0–37)
Albumin: 3.4 g/dL — ABNORMAL LOW (ref 3.5–5.2)
BUN: 43 mg/dL — ABNORMAL HIGH (ref 6–23)
Calcium: 9.2 mg/dL (ref 8.4–10.5)
Chloride: 101 mEq/L (ref 96–112)
Creatinine, Ser: 2.21 mg/dL — ABNORMAL HIGH (ref 0.50–1.10)
GFR calc Af Amer: 24 mL/min — ABNORMAL LOW (ref >90)
Total Bilirubin: 0.6 mg/dL (ref 0.3–1.2)

## 2013-08-11 LAB — CBC WITH DIFFERENTIAL/PLATELET
Basophils Absolute: 0 10*3/uL (ref 0.0–0.1)
Basophils Relative: 0 % (ref 0–1)
Eosinophils Absolute: 0.1 10*3/uL (ref 0.0–0.7)
Lymphocytes Relative: 6 % — ABNORMAL LOW (ref 12–46)
MCH: 27.4 pg (ref 26.0–34.0)
MCV: 86.6 fL (ref 78.0–100.0)
Monocytes Absolute: 0.2 10*3/uL (ref 0.1–1.0)
Monocytes Relative: 6 % (ref 3–12)
Neutro Abs: 2.9 10*3/uL (ref 1.7–7.7)
Neutrophils Relative %: 86 % — ABNORMAL HIGH (ref 43–77)
Platelets: 159 10*3/uL (ref 150–400)
RBC: 4.09 MIL/uL (ref 3.87–5.11)
RDW: 17.2 % — ABNORMAL HIGH (ref 11.5–15.5)
WBC: 3.3 10*3/uL — ABNORMAL LOW (ref 4.0–10.5)

## 2013-08-11 LAB — GLUCOSE, CAPILLARY
Glucose-Capillary: 200 mg/dL — ABNORMAL HIGH (ref 70–99)
Glucose-Capillary: 145 mg/dL — ABNORMAL HIGH (ref 70–99)
Glucose-Capillary: 161 mg/dL — ABNORMAL HIGH (ref 70–99)
Glucose-Capillary: 161 mg/dL — ABNORMAL HIGH (ref 70–99)
Glucose-Capillary: 227 mg/dL — ABNORMAL HIGH (ref 70–99)
Glucose-Capillary: 187 mg/dL — ABNORMAL HIGH (ref 70–99)
Glucose-Capillary: 231 mg/dL — ABNORMAL HIGH (ref 70–99)
Glucose-Capillary: 293 mg/dL — ABNORMAL HIGH (ref 70–99)

## 2013-08-11 LAB — CSF CELL COUNT WITH DIFFERENTIAL
Eosinophils, CSF: 0 % (ref 0–1)
Lymphs, CSF: 97 % — ABNORMAL HIGH (ref 40–80)
Lymphs, CSF: 98 % — ABNORMAL HIGH (ref 40–80)
Other Cells, CSF: 0
Other Cells, CSF: 0
RBC Count, CSF: 7 /mm3 — ABNORMAL HIGH
RBC Count, CSF: 9 /mm3 — ABNORMAL HIGH
Tube #: 1
Tube #: 2

## 2013-08-11 LAB — PROTEIN, CSF: Total  Protein, CSF: 82 mg/dL — ABNORMAL HIGH (ref 15–45)

## 2013-08-11 LAB — URINE CULTURE
Colony Count: NO GROWTH
Culture: NO GROWTH

## 2013-08-11 LAB — GLUCOSE, CSF: Glucose, CSF: 74 mg/dL (ref 43–76)

## 2013-08-11 LAB — CRYPTOCOCCAL ANTIGEN, CSF

## 2013-08-11 LAB — MRSA PCR SCREENING: MRSA by PCR: NEGATIVE

## 2013-08-11 MED ORDER — MYCOPHENOLATE MOFETIL 250 MG PO CAPS
500.0000 mg | ORAL_CAPSULE | Freq: Two times a day (BID) | ORAL | Status: DC
  Administered 2013-08-11 (×2): 500 mg via ORAL
  Filled 2013-08-11 (×3): qty 2

## 2013-08-11 MED ORDER — INSULIN ASPART 100 UNIT/ML ~~LOC~~ SOLN
0.0000 [IU] | Freq: Three times a day (TID) | SUBCUTANEOUS | Status: DC
  Administered 2013-08-11: 5 [IU] via SUBCUTANEOUS
  Administered 2013-08-11 – 2013-08-12 (×3): 2 [IU] via SUBCUTANEOUS
  Administered 2013-08-13: 7 [IU] via SUBCUTANEOUS
  Administered 2013-08-13 – 2013-08-14 (×3): 5 [IU] via SUBCUTANEOUS
  Administered 2013-08-14: 2 [IU] via SUBCUTANEOUS
  Administered 2013-08-15 – 2013-08-16 (×4): 3 [IU] via SUBCUTANEOUS
  Administered 2013-08-16: 1 [IU] via SUBCUTANEOUS

## 2013-08-11 MED ORDER — ENOXAPARIN SODIUM 30 MG/0.3ML ~~LOC~~ SOLN
30.0000 mg | Freq: Every day | SUBCUTANEOUS | Status: DC
  Administered 2013-08-11 – 2013-08-17 (×7): 30 mg via SUBCUTANEOUS
  Filled 2013-08-11 (×7): qty 0.3

## 2013-08-11 MED ORDER — SIROLIMUS 1 MG PO TABS
3.0000 mg | ORAL_TABLET | Freq: Every day | ORAL | Status: DC
  Filled 2013-08-11: qty 3

## 2013-08-11 MED ORDER — DEXTROSE 5 % IV SOLN
10.0000 mg/kg | INTRAVENOUS | Status: DC
  Administered 2013-08-11: 630 mg via INTRAVENOUS
  Filled 2013-08-11: qty 12.6

## 2013-08-11 MED ORDER — PREDNISONE 10 MG PO TABS
10.0000 mg | ORAL_TABLET | Freq: Every day | ORAL | Status: DC
  Administered 2013-08-11 – 2013-08-17 (×7): 10 mg via ORAL
  Filled 2013-08-11 (×9): qty 1

## 2013-08-11 MED ORDER — LEVOTHYROXINE SODIUM 112 MCG PO TABS
224.0000 ug | ORAL_TABLET | Freq: Every day | ORAL | Status: DC
  Administered 2013-08-11 – 2013-08-17 (×7): 224 ug via ORAL
  Filled 2013-08-11 (×9): qty 2

## 2013-08-11 MED ORDER — INSULIN DETEMIR 100 UNIT/ML ~~LOC~~ SOLN
10.0000 [IU] | Freq: Every day | SUBCUTANEOUS | Status: DC
  Administered 2013-08-11: 10 [IU] via SUBCUTANEOUS
  Filled 2013-08-11: qty 0.1

## 2013-08-11 MED ORDER — SIROLIMUS 1 MG PO TABS
3.0000 mg | ORAL_TABLET | Freq: Every day | ORAL | Status: DC
  Administered 2013-08-11 – 2013-08-16 (×6): 3 mg via ORAL
  Filled 2013-08-11 (×8): qty 3

## 2013-08-11 MED ORDER — ONDANSETRON HCL 4 MG/2ML IJ SOLN: 4.0000 mg | Freq: Four times a day (QID) | INTRAMUSCULAR | Status: DC | PRN

## 2013-08-11 MED ORDER — ALLOPURINOL 150 MG HALF TABLET
150.0000 mg | ORAL_TABLET | Freq: Every day | ORAL | Status: DC
  Administered 2013-08-11 – 2013-08-17 (×7): 150 mg via ORAL
  Filled 2013-08-11 (×7): qty 1

## 2013-08-11 MED ORDER — ONDANSETRON HCL 4 MG PO TABS: 4.0000 mg | ORAL_TABLET | Freq: Four times a day (QID) | ORAL | Status: DC | PRN

## 2013-08-11 MED ORDER — POTASSIUM CHLORIDE CRYS ER 20 MEQ PO TBCR
20.0000 meq | EXTENDED_RELEASE_TABLET | Freq: Once | ORAL | Status: AC
  Administered 2013-08-11: 20 meq via ORAL
  Filled 2013-08-11: qty 1

## 2013-08-11 MED ORDER — SODIUM CHLORIDE 0.9 % IV SOLN
INTRAVENOUS | Status: DC
  Administered 2013-08-11 – 2013-08-14 (×6): via INTRAVENOUS

## 2013-08-11 MED ORDER — SODIUM CHLORIDE 0.9 % IV SOLN
500.0000 mg | Freq: Two times a day (BID) | INTRAVENOUS | Status: DC
  Administered 2013-08-11 (×2): 500 mg via INTRAVENOUS
  Filled 2013-08-11 (×3): qty 0.5

## 2013-08-11 MED ORDER — CALCITRIOL 0.5 MCG PO CAPS
0.5000 ug | ORAL_CAPSULE | Freq: Every day | ORAL | Status: DC
  Administered 2013-08-11 – 2013-08-17 (×7): 0.5 ug via ORAL
  Filled 2013-08-11 (×8): qty 1

## 2013-08-11 MED ORDER — MYCOPHENOLATE MOFETIL 250 MG PO CAPS
500.0000 mg | ORAL_CAPSULE | Freq: Two times a day (BID) | ORAL | Status: DC
  Administered 2013-08-11 – 2013-08-17 (×12): 500 mg via ORAL
  Filled 2013-08-11 (×17): qty 2

## 2013-08-11 MED ORDER — ACETAMINOPHEN 650 MG RE SUPP: 650.0000 mg | Freq: Four times a day (QID) | RECTAL | Status: DC | PRN

## 2013-08-11 MED ORDER — DEXTROSE 5 % IV SOLN
10.0000 mg/kg | INTRAVENOUS | Status: DC
  Administered 2013-08-12 – 2013-08-13 (×2): 565 mg via INTRAVENOUS
  Filled 2013-08-11 (×2): qty 11.3

## 2013-08-11 MED ORDER — ACETAMINOPHEN 325 MG PO TABS
650.0000 mg | ORAL_TABLET | Freq: Four times a day (QID) | ORAL | Status: DC | PRN
  Administered 2013-08-11: 650 mg via ORAL
  Filled 2013-08-11 (×2): qty 2

## 2013-08-11 MED ORDER — LATANOPROST 0.005 % OP SOLN
1.0000 [drp] | Freq: Every day | OPHTHALMIC | Status: DC
  Administered 2013-08-11 – 2013-08-16 (×7): 1 [drp] via OPHTHALMIC
  Filled 2013-08-11: qty 2.5

## 2013-08-11 NOTE — Progress Notes (Signed)
Utilization review completed.  

## 2013-08-11 NOTE — Progress Notes (Signed)
PT Cancellation Note  Patient Details Name: IZORA BENN MRN: [id number] DOB: 1941/04/08   Cancelled Treatment:    Reason Eval/Treat Not Completed: Other (comment) (pt just placed in bed by nursing)   INGOLD,Willamae Demby 08/11/2013, 2:34 PM  [facility] [phone] (pager)

## 2013-08-11 NOTE — H&P (Signed)
[name] is an 72 y.o. female.  Plan: Continue acyclovir as empiric treatment for encephalitis [place] blood cultures  Assessment: encephalitis vs metabolic encehalopathy  HPI  According to her attendant, she presents with altered mental status since yesterday evening. She was unresponsive to verbal command and confused. This was associated with one episode of vomiting but no fever, neck stiffness or headache. Her husband states that she was able to respond to some of his questions earlier this morning but continues to be confused. At the hospital, she has been put on meropenem, clindamycin, acyclovir and prednisone. A LP was done yesterday. The CSF culture showed some WBC, predominantly PMNs but no organisms were seen.  Her past medical history is significant for a renal transplant in 2008 for which she is on long term prednisone use.   Review of Systems  Unable to perform ROS: patient unresponsive   PMH: Hospital Immunosuppressed status Hypertension Metabolic encephalopathy Diabetes mellitus type 2, uncontrolled Weakness History of renal transplant Meningitis, viral Non-Hospital Heart murmur Chronic kidney disease, stage III (moderate) UTI (lower urinary tract infection) Sepsis syndrome Dehydration Thyroid  Allergies:    ADHESIVE    ASPIRIN    HYDROCODONE  Other (See Comments)   IOHEXOL    OXYCODONE    PENICILLINS  Hives, Swelling   TACROLIMUS  Other (See Comments)   CONTRAST MEDIA  Hives, Swelling, Rash      Blood pressure 148/64, pulse 77, temperature 98.2 F (36.8 C), temperature source Oral, resp. rate 28, height 5\' 3"  (1.6 m), weight 56.6 kg (124 lb 12.5 oz), SpO2 99.00%. Physical Exam  Cardiovascular: Normal rate, regular rhythm and normal heart sounds.   Respiratory: Effort normal and breath sounds normal.  GI: Soft. Bowel sounds are normal.  Skin: Skin is warm and dry.   neuro: unresponsive Other exams unremarkable      10/21 1823   10/22 0600      WBC 3.6    3.3      RBC 3.94  4.09     Hemoglobin 10.8   11.2      HCT 34.0   35.4      Platelets 158  159     Sodium 143  144     Potassium 3.9  3.4      Chloride 100  101     CO2 32  31     BUN 51   43      Creatinine 2.62   2.21      Calcium 9.8  9.2     Glucose 116   162      CT head IMPRESSION:  1. No acute intracranial findings.  2. Unchanged from multiple head CT comparisons.   CXR IMPRESSION:  Low lung volumes without focal disease.   CSF culture [[id number]] Collected: 08/10/13 2230 Order Status: Completed Updated: 08/11/13 0705 Specimen Information: Cerebrospinal Fluid Specimen Description CSF Special Requests Immunocompromised Gram Stain - Result: CYTOSPIN WBC PRESENT, PREDOMINANTLY MONONUCLEAR NO ORGANISMS SEEN Performed at [facility] Performed at [facility] Culture PENDING Report Status PENDING Gram stain - STAT with CSF culture [[id number]] Collected: 08/10/13 2230 Order Status: Completed Updated: 08/10/13 2359 Specimen Information: Cerebrospinal Fluid Specimen Description CSF Special Requests NONE Gram Stain - Result: CYTOSPIN WBC PRESENT, PREDOMINANTLY MONONUCLEAR NO ORGANISMS SEEN Report Status 08/10/2013 FINAL Urine culture [[id number]] Collected: 08/10/13    Otilio Carpen 08/11/2013, 10:34 AM

## 2013-08-11 NOTE — Plan of Care (Signed)
Problem: Phase I Progression Outcomes Goal: Voiding-avoid urinary catheter unless indicated Outcome: Adequate for Discharge Pt incontinent urine as per husband per preadmission

## 2013-08-11 NOTE — Progress Notes (Signed)
CSW met with the Pt to discuss Advance Directives request. Pt's husband stated that he did not request Advance Directives and does not desire to complete them at this time.   CSW will follow for any further needs during admission.   Leron Croak, LCSWA [facility] Emergency Dept.  [phone]

## 2013-08-11 NOTE — Progress Notes (Addendum)
ANTIBIOTIC CONSULT NOTE - INITIAL  Pharmacy Consult for Meropenem, Acyclovir Indication: empiric CNS coverage  Allergies  Allergen Reactions  . Adhesive [Tape]     Skin irritation, "pulls the skin off"   . Aspirin     Crawling feeling under skin  . Hydrocodone Other (See Comments)    Hallucinations   . Iohexol      Desc: pt unable to tell what the reaction was   . Oxycodone     hallucinations  . Penicillins Hives and Swelling    Has tolerated cefazolin and ceftriaxone.  . Tacrolimus Other (See Comments)    Hallucinations   . Contrast Media [Iodinated Diagnostic Agents] Hives, Swelling and Rash    Wt: 63.1 kg  Vital Signs: Temp: 98.5 F (36.9 C) (10/21 1626) Temp src: Oral (10/21 1626) BP: 135/46 mmHg (10/21 2317) Pulse Rate: 62 (10/21 2317) Labs:  Recent Labs  08/10/13 1823  WBC 3.6*  HGB 10.8*  PLT 158  CREATININE 2.62*    Microbiology: Recent Results (from the past 720 hour(s))  GRAM STAIN     Status: None   Collection Time    08/10/13 10:30 PM      Result Value Range Status   Specimen Description CSF   Final   Special Requests NONE   Final   Gram Stain     Final   Value: CYTOSPIN     WBC PRESENT, PREDOMINANTLY MONONUCLEAR     NO ORGANISMS SEEN   Report Status 08/10/2013 FINAL   Final    Medical History: Past Medical History  Diagnosis Date  . Diabetes mellitus   . Diabetic neuropathy   . Hypothyroidism   . Hypertension   . GERD (gastroesophageal reflux disease)   . Heart murmur     not treated for  . Arthritis   . Chronic kidney disease     RENAL TRANSPLANT IN 2008--PT IS FOLLOWED BY DR. Karie Fetch WITH BUN OF 37 AND CREAT 1.7--PER NOTE DR. NESI FROM 04/11/12  . Urethral diverticulum   . Eyesight diminished     GLAUCOMA   . History of shingles     LEFT EYE JAN 2012--STILL HAS EYE PAIN-STATES NOT ABLE TO TAKE THE MEDICATION FOR SHINGLES BECAUSE OF HER HX OF KIDNEY TRANSPLANT  . Fingernail abnormalities     FUNGUS OF FINGERNAILS  .  UTI (urinary tract infection) 05/18/2013    Assessment: 72 y/o F here with AMS. LP was performed in the ED (pending). WBC 3.6, Scr 2.62 with CrCl ~ 17. Rocephin x 1 given in the ED.   Goal of Therapy:  Clinical resolution  Plan:  -Meropenem 500 mg IV q12h -Acyclovir 630 mg IV q24h -Trend WBC, temp, renal function -De-escalate based on LP results? -?Vancomycin if CNS coverage continues   Thank you for allowing me to take part in this patient's care,  [name], PharmD Clinical Pharmacist Phone: [phone] Pager: [phone] 08/11/2013 12:47 AM   Addendum: Weight has been updated since admission to 56 kg.  Adjusted Acyclovir dose accordingly.  [employer], Pharm.D., BCPS Clinical Pharmacist Pager [phone] 08/11/2013 1:35 PM

## 2013-08-11 NOTE — H&P (Signed)
[facility]     Reason for Consult: encephalitis vs meningitis    Referring Physician: Dr. Clelia Croft  Active Problems:   Hypertension   Metabolic encephalopathy   Diabetes mellitus type 2, uncontrolled   Weakness   History of renal transplant   Meningitis, viral   Immunosuppressed status   . acyclovir  10 mg/kg Intravenous Q24H  . allopurinol  150 mg Oral Daily  . calcitRIOL  0.5 mcg Oral Q breakfast  . enoxaparin (LOVENOX) injection  30 mg Subcutaneous Daily  . insulin aspart  0-9 Units Subcutaneous TID WC  . insulin detemir  10 Units Subcutaneous QHS  . latanoprost  1 drop Both Eyes QHS  . levothyroxine  224 mcg Oral QAC breakfast  . mycophenolate  500 mg Oral BID  . predniSONE  10 mg Oral Q breakfast  . sirolimus  3 mg Oral Q2000    Recommendations: D/c antibacterials Continue with acyclovir pending HSV D/c droplet isolation   Assessment: She has a history and CSF findings consistent with viral encephalitis with mild WBC elevation, lymphocytes.  Doubt RMSF this time of year and no exposure history.     Antibiotics: Ceftriaxone, meropenem  HPI: FREDRICA CAPANO is a 72 y.o. female with a history of renal transplant 2008, underlying dementia and recent SNF placement at home the last week who presented to ED with altered mental status for a day.  Her husband is at the bedside and gives the history.  EMS brought her in and with concern for infection, LP was done and signficant for WBCs of 33, mildly elevated protein, normal glucose (peripheral glucose at the time is difficult to determine with D50 given).  She was started on empiric therapy and admitted.  No report by husband of nuchal rigidity, headache, fever, chills.  No sick contacts.     Review of Systems: unable to obtain from the patient  Past Medical History  Diagnosis Date  . Diabetes mellitus   . Diabetic neuropathy   . Hypothyroidism   . Hypertension   . GERD (gastroesophageal  reflux disease)   . Heart murmur     not treated for  . Arthritis   . Chronic kidney disease     RENAL TRANSPLANT IN 2008--PT IS FOLLOWED BY DR. Karie Fetch WITH BUN OF 37 AND CREAT 1.7--PER NOTE DR. NESI FROM 04/11/12  . Urethral diverticulum   . Eyesight diminished     GLAUCOMA   . History of shingles     LEFT EYE JAN 2012--STILL HAS EYE PAIN-STATES NOT ABLE TO TAKE THE MEDICATION FOR SHINGLES BECAUSE OF HER HX OF KIDNEY TRANSPLANT  . Fingernail abnormalities     FUNGUS OF FINGERNAILS  . UTI (urinary tract infection) 05/18/2013    History  Substance Use Topics  . Smoking status: Former Smoker -- 0.50 packs/day for 20 years    Quit date: 10/21/1978  . Smokeless tobacco: Never Used  . Alcohol Use: No    Family History  Problem Relation Age of Onset  . Anesthesia problems Neg Hx    Allergies  Allergen Reactions  . Adhesive [Tape]     Skin irritation, "pulls the skin off"   . Aspirin     Crawling feeling under skin  . Hydrocodone Other (See Comments)    Hallucinations   . Iohexol      Desc: pt unable to tell what the reaction was   . Oxycodone     hallucinations  . Penicillins Hives  and Swelling    Has tolerated cefazolin and ceftriaxone.  . Tacrolimus Other (See Comments)    Hallucinations   . Contrast Media [Iodinated Diagnostic Agents] Hives, Swelling and Rash    OBJECTIVE: Blood pressure 148/64, pulse 77, temperature 98.2 F (36.8 C), temperature source Oral, resp. rate 28, height 5\' 3"  (1.6 m), weight 124 lb 12.5 oz (56.6 kg), SpO2 99.00%. General: sleeping, arousable Skin: no rashes Lungs: CTA B Cor: RRR Abdomen: soft, nt, nd Neuro: difficult to assess at this time after patient received medication, not able to cooperate now  Microbiology: Recent Results (from the past 240 hour(s))  CSF CULTURE     Status: None   Collection Time    08/10/13 10:30 PM      Result Value Range Status   Specimen Description CSF   Final   Special Requests  Immunocompromised   Final   Gram Stain     Final   Value: CYTOSPIN WBC PRESENT, PREDOMINANTLY MONONUCLEAR     NO ORGANISMS SEEN     Performed at [facility]     Performed at [facility]   Culture PENDING   Incomplete   Report Status PENDING   Incomplete  GRAM STAIN     Status: None   Collection Time    08/10/13 10:30 PM      Result Value Range Status   Specimen Description CSF   Final   Special Requests NONE   Final   Gram Stain     Final   Value: CYTOSPIN     WBC PRESENT, PREDOMINANTLY MONONUCLEAR     NO ORGANISMS SEEN   Report Status 08/10/2013 FINAL   Final  MRSA PCR SCREENING     Status: None   Collection Time    08/11/13  1:50 AM      Result Value Range Status   MRSA by PCR NEGATIVE  NEGATIVE Final   Comment:            The GeneXpert MRSA Assay (FDA     approved for NASAL specimens     only), is one component of a     comprehensive MRSA colonization     surveillance program. It is not     intended to diagnose MRSA     infection nor to guide or     monitor treatment for     MRSA infections.    Staci Righter, MD [facility] [email or web address] [phone] pager  [phone] cell 08/11/2013, 11:15 AM

## 2013-08-11 NOTE — Evaluation (Signed)
Occupational Therapy Evaluation Patient Details Name: [name] MRN: [id number] DOB: Jan 05, 1941 Today's Date: 08/11/2013 Time: 4098-1191 OT Time Calculation (min): 43 min  OT Assessment / Plan / Recommendation History of present illness This 71 y.o.female admitted 08/10/13 with altered mental status since yesterday evening. She was unresponsive to verbal command and confused. This was associated with one episode of vomiting but no fever, neck stiffness or headache. Her husband states that she was able to respond to some of his questions earlier this morning but continues to be confused. At the hospital, she has been put on meropenem, clindamycin, acyclovir and prednisone. A LP was done yesterday (08/10/13). The CSF culture showed some WBC, predominantly PMNs but no organisms were seen.  Her past medical history is significant for a renal transplant in 2008 for which she is on long term prednisone use.  Dx: Encephalitis vs. metabolic encephalopathy.   Clinical Impression   Pt admitted with above.  She has h/o dementia and required max PTA.  Husband is supportive and plans to take pt home.  Currently, pt wit total A with BADLs, and max A with stand pivot transfers.  Feel she will benefit from continued OT to address below listed deficits to allow pt to return home with spouse at, or close to baseline level.     OT Assessment  Patient needs continued OT Services    Follow Up Recommendations  Home health OT;Supervision/Assistance - 24 hour    Barriers to Discharge      Equipment Recommendations  None recommended by OT    Recommendations for Other Services    Frequency  Min 2X/week    Precautions / Restrictions Precautions Precautions: Fall   Pertinent Vitals/Pain     ADL  Eating/Feeding: +1 Total assistance Where Assessed - Eating/Feeding: Chair Grooming: Wash/dry hands;Wash/dry face;+1 Total assistance Where Assessed - Grooming: Supported sitting Upper Body Bathing: +1 Total  assistance Where Assessed - Upper Body Bathing: Supported sitting Lower Body Bathing: +1 Total assistance Where Assessed - Lower Body Bathing: Supported sit to stand Upper Body Dressing: +1 Total assistance Where Assessed - Upper Body Dressing: Supported sitting Lower Body Dressing: +1 Total assistance Where Assessed - Lower Body Dressing: Supported sit to Pharmacist, hospital: Maximal assistance Toilet Transfer Method: Sit to stand;Stand pivot Acupuncturist: Bedside commode Toileting - Clothing Manipulation and Hygiene: +1 Total assistance Where Assessed - Glass blower/designer Manipulation and Hygiene: Standing Transfers/Ambulation Related to ADLs: max A stand pivot transfer ADL Comments: Pt incontinent of urine.  Cleaned pt up with assist of spouse    OT Diagnosis: Generalized weakness;Cognitive deficits  OT Problem List: Decreased strength;Decreased activity tolerance;Decreased range of motion;Impaired balance (sitting and/or standing);Decreased cognition;Decreased knowledge of use of DME or AE OT Treatment Interventions: Self-care/ADL training;DME and/or AE instruction;Therapeutic activities;Patient/family education   OT Goals(Current goals can be found in the care plan section) Acute Rehab OT Goals Patient Stated Goal: Pt unable to state.  Spouse plans to take pt home OT Goal Formulation: With family Time For Goal Achievement: 08/25/13 Potential to Achieve Goals: Good ADL Goals Pt Will Transfer to Toilet: bedside commode;with max assist (Pt will perform ~40% of transfer) Pt Will Perform Toileting - Clothing Manipulation and hygiene: with max assist;sit to/from stand  Visit Information  Last OT Received On: 08/11/13 Assistance Needed: +1 (bed to chair only) History of Present Illness: This 72 y.o.female admitted 08/10/13 with altered mental status since yesterday evening. She was unresponsive to verbal command and confused. This was associated with  one episode of  vomiting but no fever, neck stiffness or headache. Her husband states that she was able to respond to some of his questions earlier this morning but continues to be confused. At the hospital, she has been put on meropenem, clindamycin, acyclovir and prednisone. A LP was done yesterday (08/10/13). The CSF culture showed some WBC, predominantly PMNs but no organisms were seen.  Her past medical history is significant for a renal transplant in 2008 for which she is on long term prednisone use.  Dx: Encephalitis vs. metabolic encephalopathy.       Prior Functioning     Home Living Family/patient expects to be discharged to:: Private residence Living Arrangements: Spouse/significant other Available Help at Discharge: Family;Available 24 hours/day Type of Home: House Home Access: Stairs to enter [employer] of Steps: 2 (has portable ramp) Entrance Stairs-Rails: None Home Layout: One level Home Equipment: Walker - 4 wheels;Bedside commode;Grab bars - tub/shower;Wheelchair - power (portable ramp) Additional Comments: Pt transfers bed to wheelchair, w/c to [facility] only.  Spouse reports she only takes a couple of steps with rollator Prior Function Level of Independence: Needs assistance Gait / Transfers Assistance Needed: max A - spouse reports he does ~60% ADL's / Homemaking Assistance Needed: pt is max A with UB ADLs and total A with LB ADLs.  Max A with stand pivot transfer to w/c.  She will feed self after set up Communication / Swallowing Assistance Needed: limited verbalization Communication Communication: Other (comment) (limited verbalization) Dominant Hand: Right         Vision/Perception Vision - Assessment Vision Assessment: Vision not tested Additional Comments: Pt unable to participate.  Keeps eyes closed majority of time   Cognition  Cognition Arousal/Alertness: Lethargic Behavior During Therapy: Flat affect Overall Cognitive Status: Impaired/Different from  baseline Area of Impairment: Attention;Following commands Current Attention Level: Focused Following Commands:  (follows no commands)    Extremity/Trunk Assessment Upper Extremity Assessment Upper Extremity Assessment: RUE deficits/detail;LUE deficits/detail RUE Deficits / Details: Pt with history of degenerative changes bil. shoulders.  Very limited ROM available LUE Deficits / Details: Pt with history of degenerative changes bil. shoulders.  Very limited ROM available Lower Extremity Assessment Lower Extremity Assessment: Defer to PT evaluation Cervical / Trunk Assessment Cervical / Trunk Assessment: Kyphotic     Mobility Bed Mobility Bed Mobility: Rolling Right;Rolling Left;Supine to Sit;Sitting - Scoot to Edge of Bed Rolling Right: 1: +1 Total assist Rolling Left: 1: +1 Total assist Supine to Sit: 1: +1 Total assist Sitting - Scoot to Edge of Bed: 1: +1 Total assist Details for Bed Mobility Assistance: Pt assisting 0% Transfers Transfers: Sit to Stand;Stand to Sit Sit to Stand: 2: Max assist;With upper extremity assist;From bed;From chair/3-in-1 Stand to Sit: 2: Max assist;With upper extremity assist;To chair/3-in-1 Details for Transfer Assistance: Pt able to bear weight on LEs and will assist spontaneously with standing, once moved into that position     Exercise     Balance Balance Balance Assessed: Yes Static Sitting Balance Static Sitting - Balance Support: Right upper extremity supported;Left upper extremity supported;Feet supported Static Sitting - Level of Assistance: 5: Stand by assistance Static Sitting - Comment/# of Minutes: 4   End of Session OT - End of Session Activity Tolerance: Patient limited by fatigue Patient left: in chair;with call bell/phone within reach;with family/visitor present Nurse Communication: Mobility status  GO     Linsie Lupo M 08/11/2013, 12:49 PM

## 2013-08-11 NOTE — Progress Notes (Signed)
Subjective: Doing better this am- more alert, interactive.  Complains of chills.  No headache, neck stiffness, or other complaints.  Objective: Vital signs in last 24 hours: Temp:  [97.2 F (36.2 C)-98.5 F (36.9 C)] 97.2 F (36.2 C) (10/22 0325) Pulse Rate:  [59-70] 59 (10/22 0325) Resp:  [16-19] 18 (10/22 0325) BP: (102-154)/(46-65) 102/49 mmHg (10/22 0325) SpO2:  [96 %-100 %] 98 % (10/22 0325) Weight:  [56.6 kg (124 lb 12.5 oz)-63.1 kg (139 lb 1.8 oz)] 56.6 kg (124 lb 12.5 oz) (10/22 0110) Weight change:     CBG (last 3)   Recent Labs  08/11/13 0325 08/11/13 0547 08/11/13 0631  GLUCAP 185* 145* 161*    Intake/Output from previous day: 10/21 0701 - 10/22 0700 In: 462.6 [I.V.:300; IV Piggyback:162.6] Out: 14 [Urine:14] Intake/Output this shift:    General appearance: fatigued, will follow commands Neck: no nuchal rigidity Eyes: no scleral icterus; left ptosis Throat: oropharynx moist without erythema Resp: clear to auscultation bilaterally Cardio: regular rate and rhythm GI: soft, non-tender; bowel sounds normal; no masses,  no organomegaly Extremities: no clubbing, cyanosis or edema; callouses over finger tips;  Neuro: left leg weakness (chronic)- 3/5; right leg 4/5 (chronic); upper extremity strength is normal and symmetric   Lab Results:  Recent Labs  08/10/13 1823 08/11/13 0600  NA 143 144  K 3.9 3.4*  CL 100 101  CO2 32 31  GLUCOSE 116* 162*  BUN 51* 43*  CREATININE 2.62* 2.21*  CALCIUM 9.8 9.2    Recent Labs  08/10/13 1823 08/11/13 0600  AST 15 16  ALT 10 9  ALKPHOS 42 42  BILITOT 0.6 0.6  PROT 6.7 6.4  ALBUMIN 3.7 3.4*    Recent Labs  08/10/13 1823 08/11/13 0600  WBC 3.6* 3.3*  NEUTROABS 3.0 2.9  HGB 10.8* 11.2*  HCT 34.0* 35.4*  MCV 86.3 86.6  PLT 158 159   Lab Results  Component Value Date   INR 1.0 02/19/2009   INR 0.9 04/02/2007   CSF (tube 2)- WBC 32 (98 lymphs, 2 monos), RBC 9; glucose 74, protein 82 CSF gram stain  negative CSF culture pending; HSV PCR, Enterovirus PCR added on this am   Studies/Results: Dg Chest 2 View  08/10/2013   CLINICAL DATA:  Weakness.  EXAM: CHEST  2 VIEW  COMPARISON:  07/01/2013  FINDINGS: Two views of the chest demonstrate low lung volumes. Slightly prominent right central vascular structures are unchanged. No focal airspace disease. Heart size is stable. Patient is rotated towards the left.  IMPRESSION: Low lung volumes without focal disease.   Electronically Signed   By: Richarda Overlie M.D.   On: 08/10/2013 19:05   Ct Head Wo Contrast  08/10/2013   CLINICAL DATA:  Altered mental status, weakness  EXAM: CT HEAD WITHOUT CONTRAST  TECHNIQUE: Contiguous axial images were obtained from the base of the skull through the vertex without intravenous contrast.  COMPARISON:  Head CT 06/27/2013, 05/21/2013  FINDINGS: No acute intracranial hemorrhage. No focal mass lesion. No CT evidence of acute infarction. No midline shift or mass effect. No hydrocephalus. Basilar cisterns are patent. Mild atrophy and microvascular disease unchanged.  Paranasal sinuses and mastoid air cells are clear.  IMPRESSION: 1. No acute intracranial findings.  2. Unchanged from multiple head CT comparisons.   Electronically Signed   By: Genevive Bi M.D.   On: 08/10/2013 19:16   Dg Lumbar Puncture Fluoro Guide  08/10/2013   CLINICAL DATA:  Altered mental status  EXAM:  DIAGNOSTIC LUMBAR PUNCTURE UNDER FLUOROSCOPIC GUIDANCE  FLUOROSCOPY TIME:  Zero Min and 36 seconds  PROCEDURE: Informed consent was obtained from the patient prior to the procedure, including potential complications of headache, allergy, and pain. With the patient prone, the lower back was prepped with Betadine. 1% Lidocaine was used for local anesthesia. Lumbar puncture was performed at the L3-4 level using a gauge needle with return of clear CSF with an opening pressure of 14 cm water. 4ml of CSF were obtained for laboratory studies. The patient tolerated  the procedure well and there were no apparent complications. The patient was uncooperative and began moving during collection of fluid. The needle was dislodged from the subarachnoid space and subsequently removed.  IMPRESSION: Lumbar puncture with clear CSF.   Electronically Signed   By: Marlan Palau M.D.   On: 08/10/2013 23:46     Medications: Scheduled: . acyclovir  10 mg/kg Intravenous Q24H  . allopurinol  150 mg Oral Daily  . calcitRIOL  0.5 mcg Oral Q breakfast  . enoxaparin (LOVENOX) injection  30 mg Subcutaneous Daily  . insulin aspart  0-9 Units Subcutaneous TID WC  . insulin detemir  10 Units Subcutaneous QHS  . latanoprost  1 drop Both Eyes QHS  . levothyroxine  224 mcg Oral QAC breakfast  . meropenem (MERREM) IV  500 mg Intravenous Q12H  . mycophenolate  500 mg Oral BID  . predniSONE  10 mg Oral Q breakfast  . sirolimus  3 mg Oral Daily   Continuous:   Assessment/Plan: Active Problems: 1. Metabolic encephalopathy- 36 hours of declining mental status associated with low grade fever in immunosuppressed patient- CSF suggestive of possible viral meningitis/encephalitis.  Added on HSV and Enterovirus PCR.  Continue empiric Acycolovir.  Will consult ID and likely discontinue other antibiotics.  Clinically improved this am.  Continue supportive care.  Narcotics held for now.    2. History of renal transplant with Immunosuppressed status- continue immunosuppressants.  Hold off on stress dosed steroids if BP stable.  Renal function improving with hydration.  Change IV fluids to NS @75cc /hr. 3. Diabetes mellitus type 2, uncontrolled- transient profound hypoglycemia in ED (<10) may have been a lab error given rapid correction with D50 to >500.  Discontinue D5 drip and continue monitoring CBGs q2 hours for 6 hours then qAC.  Add SSI and resume Levemir tonight if stable.  Doubt that hypoglycemia caused mental status changes as her sugar was ~200 at home.  4. Hypertension- Labetalol on  hold.  Resume if BP increases significantly. 5. Weakness- PT/OT evaluations. 6. Disposition- possible transfer to floor tomorrow if stable.  Anticipate discharge back home once stable but may need return to SNF.   LOS: 1 day   Corona Popovich,W DOUGLAS 08/11/2013, 7:52 AM

## 2013-08-12 DIAGNOSIS — G9341 Metabolic encephalopathy: Secondary | ICD-10-CM

## 2013-08-12 LAB — CBC WITH DIFFERENTIAL/PLATELET
Basophils Absolute: 0 10*3/uL (ref 0.0–0.1)
Basophils Relative: 0 % (ref 0–1)
Eosinophils Absolute: 0 10*3/uL (ref 0.0–0.7)
Eosinophils Relative: 1 % (ref 0–5)
Hemoglobin: 10.1 g/dL — ABNORMAL LOW (ref 12.0–15.0)
Lymphocytes Relative: 15 % (ref 12–46)
MCH: 27.7 pg (ref 26.0–34.0)
MCHC: 32.3 g/dL (ref 30.0–36.0)
MCV: 86 fL (ref 78.0–100.0)
Monocytes Absolute: 0.4 10*3/uL (ref 0.1–1.0)
Platelets: 145 10*3/uL — ABNORMAL LOW (ref 150–400)
RDW: 17.5 % — ABNORMAL HIGH (ref 11.5–15.5)
WBC: 3.5 10*3/uL — ABNORMAL LOW (ref 4.0–10.5)

## 2013-08-12 LAB — COMPREHENSIVE METABOLIC PANEL
Albumin: 3 g/dL — ABNORMAL LOW (ref 3.5–5.2)
Alkaline Phosphatase: 39 U/L (ref 39–117)
BUN: 38 mg/dL — ABNORMAL HIGH (ref 6–23)
Calcium: 8.6 mg/dL (ref 8.4–10.5)
Chloride: 105 mEq/L (ref 96–112)
Glucose, Bld: 134 mg/dL — ABNORMAL HIGH (ref 70–99)
Potassium: 3.4 mEq/L — ABNORMAL LOW (ref 3.5–5.1)
Total Bilirubin: 0.4 mg/dL (ref 0.3–1.2)

## 2013-08-12 LAB — GLUCOSE, CAPILLARY
Glucose-Capillary: 181 mg/dL — ABNORMAL HIGH (ref 70–99)
Glucose-Capillary: 184 mg/dL — ABNORMAL HIGH (ref 70–99)
Glucose-Capillary: 182 mg/dL — ABNORMAL HIGH (ref 70–99)
Glucose-Capillary: 204 mg/dL — ABNORMAL HIGH (ref 70–99)

## 2013-08-12 LAB — HERPES SIMPLEX VIRUS(HSV) DNA BY PCR
HSV 1 DNA: NOT DETECTED
HSV 2 DNA: NOT DETECTED

## 2013-08-12 MED ORDER — INSULIN DETEMIR 100 UNIT/ML ~~LOC~~ SOLN
16.0000 [IU] | Freq: Every day | SUBCUTANEOUS | Status: DC
  Administered 2013-08-12: 16 [IU] via SUBCUTANEOUS
  Filled 2013-08-12 (×2): qty 0.16

## 2013-08-12 MED ORDER — AMLODIPINE BESYLATE 10 MG PO TABS
10.0000 mg | ORAL_TABLET | Freq: Every day | ORAL | Status: DC
  Administered 2013-08-12 – 2013-08-17 (×6): 10 mg via ORAL
  Filled 2013-08-12 (×6): qty 1

## 2013-08-12 MED ORDER — BOOST / RESOURCE BREEZE PO LIQD
1.0000 | Freq: Three times a day (TID) | ORAL | Status: DC
  Administered 2013-08-12 – 2013-08-17 (×6): 1 via ORAL

## 2013-08-12 NOTE — Progress Notes (Signed)
[facility]  Date of Admission:  08/10/2013  Antibiotics: Antibiotics Given (last 72 hours)   Date/Time Action Medication Dose Rate   08/11/13 0249 Given   acyclovir (ZOVIRAX) 630 mg in dextrose 5 % 100 mL IVPB 630 mg 112.6 mL/hr   08/11/13 0355 Given   meropenem (MERREM) 500 mg in sodium chloride 0.9 % 50 mL IVPB 500 mg 100 mL/hr   08/11/13 1034 Given   meropenem (MERREM) 500 mg in sodium chloride 0.9 % 50 mL IVPB 500 mg 100 mL/hr   08/12/13 1610 Given   acyclovir (ZOVIRAX) 565 mg in dextrose 5 % 100 mL IVPB 565 mg 111.3 mL/hr      Subjective: More alert  Objective: Temp:  [97.8 F (36.6 C)-99.8 F (37.7 C)] 98.5 F (36.9 C) (10/23 1149) Pulse Rate:  [81-89] 81 (10/23 0747) Resp:  [14-22] 20 (10/23 1149) BP: (138-173)/(47-92) 145/66 mmHg (10/23 1149) SpO2:  [94 %-99 %] 98 % (10/23 1149) Weight:  [130 lb 4.7 oz (59.1 kg)] 130 lb 4.7 oz (59.1 kg) (10/23 0400)  General: awake, alert Skin: no rashes Lungs: CTA B Cor: RRR Abdomen: soft, nt, nd Ext: no edema Neuro: moving all extremities  Lab Results Lab Results  Component Value Date   WBC 3.5* 08/12/2013   HGB 10.1* 08/12/2013   HCT 31.3* 08/12/2013   MCV 86.0 08/12/2013   PLT 145* 08/12/2013    Lab Results  Component Value Date   CREATININE 1.98* 08/12/2013   BUN 38* 08/12/2013   NA 145 08/12/2013   K 3.4* 08/12/2013   CL 105 08/12/2013   CO2 29 08/12/2013    Lab Results  Component Value Date   ALT 8 08/12/2013   AST 13 08/12/2013   ALKPHOS 39 08/12/2013   BILITOT 0.4 08/12/2013      Microbiology: Recent Results (from the past 240 hour(s))  URINE CULTURE     Status: None   Collection Time    08/10/13  7:26 PM      Result Value Range Status   Specimen Description URINE, CATHETERIZED   Final   Special Requests NONE   Final   Culture  Setup Time     Final   Value: 08/10/2013 20:53     Performed at [facility] Count     Final   Value: NO GROWTH    Performed at [facility]   Culture     Final   Value: NO GROWTH     Performed at [facility]   Report Status 08/11/2013 FINAL   Final  CSF CULTURE     Status: None   Collection Time    08/10/13 10:30 PM      Result Value Range Status   Specimen Description CSF   Final   Special Requests Immunocompromised   Final   Gram Stain     Final   Value: CYTOSPIN WBC PRESENT, PREDOMINANTLY MONONUCLEAR     NO ORGANISMS SEEN     Performed at [facility]     Performed at [facility]   Culture PENDING   Incomplete   Report Status PENDING   Incomplete  GRAM STAIN     Status: None   Collection Time    08/10/13 10:30 PM      Result Value Range Status   Specimen Description CSF   Final   Special Requests NONE   Final   Gram Stain  Final   Value: CYTOSPIN     WBC PRESENT, PREDOMINANTLY MONONUCLEAR     NO ORGANISMS SEEN   Report Status 08/10/2013 FINAL   Final  MRSA PCR SCREENING     Status: None   Collection Time    08/11/13  1:50 AM      Result Value Range Status   MRSA by PCR NEGATIVE  NEGATIVE Final   Comment:            The GeneXpert MRSA Assay (FDA     approved for NASAL specimens     only), is one component of a     comprehensive MRSA colonization     surveillance program. It is not     intended to diagnose MRSA     infection nor to guide or     monitor treatment for     MRSA infections.    Studies/Results: Dg Chest 2 View  08/10/2013   CLINICAL DATA:  Weakness.  EXAM: CHEST  2 VIEW  COMPARISON:  07/01/2013  FINDINGS: Two views of the chest demonstrate low lung volumes. Slightly prominent right central vascular structures are unchanged. No focal airspace disease. Heart size is stable. Patient is rotated towards the left.  IMPRESSION: Low lung volumes without focal disease.   Electronically Signed   By: Richarda Overlie M.D.   On: 08/10/2013 19:05   Ct Head Wo Contrast  08/10/2013   CLINICAL DATA:  Altered mental status, weakness  EXAM: CT HEAD  WITHOUT CONTRAST  TECHNIQUE: Contiguous axial images were obtained from the base of the skull through the vertex without intravenous contrast.  COMPARISON:  Head CT 06/27/2013, 05/21/2013  FINDINGS: No acute intracranial hemorrhage. No focal mass lesion. No CT evidence of acute infarction. No midline shift or mass effect. No hydrocephalus. Basilar cisterns are patent. Mild atrophy and microvascular disease unchanged.  Paranasal sinuses and mastoid air cells are clear.  IMPRESSION: 1. No acute intracranial findings.  2. Unchanged from multiple head CT comparisons.   Electronically Signed   By: Genevive Bi M.D.   On: 08/10/2013 19:16   Dg Lumbar Puncture Fluoro Guide  08/10/2013   CLINICAL DATA:  Altered mental status  EXAM: DIAGNOSTIC LUMBAR PUNCTURE UNDER FLUOROSCOPIC GUIDANCE  FLUOROSCOPY TIME:  Zero Min and 36 seconds  PROCEDURE: Informed consent was obtained from the patient prior to the procedure, including potential complications of headache, allergy, and pain. With the patient prone, the lower back was prepped with Betadine. 1% Lidocaine was used for local anesthesia. Lumbar puncture was performed at the L3-4 level using a gauge needle with return of clear CSF with an opening pressure of 14 cm water. 4ml of CSF were obtained for laboratory studies. The patient tolerated the procedure well and there were no apparent complications. The patient was uncooperative and began moving during collection of fluid. The needle was dislodged from the subarachnoid space and subsequently removed.  IMPRESSION: Lumbar puncture with clear CSF.   Electronically Signed   By: Marlan Palau M.D.   On: 08/10/2013 23:46    Assessment/Plan: 1)  Encephalitis and metabolic encephalopathy - viral with lymphoctyes in CSF, medications as well.  Seems to be resolving.  Remains on acyclovir pending HSV, enterovirus.   Staci Righter, MD [facility] [email or web address] [phone] pager   [phone] cell 08/12/2013, 1:33 PM

## 2013-08-12 NOTE — Progress Notes (Signed)
Patient ID: [name], female   DOB: 10-23-1940, 72 y.o.   MRN: [id number]  Plan: Continue acyclovir as empiric treatment for encephalitis [place] CSF HSV PCR   Assessment: Encephalitis   Subjectively: She says she is doing well and has no significant complaints. She has had no complaints of fever, chills or sweats. Her husband states that she is more oriented compared to before but is confused at points.  Objectively:  Last recorded: 10/23 0400   BP: 158/53 Pulse: 83  Temp: 97.8 F (36.6 C) Resp: 22  SpO2: 98    Weight: 59.1 kg (130 lb 4.7 oz)    CVS: S1+ S2+0 Resp: normal vesicular breathing, no added sounds Abdomen: soft, non tender, gs +ve Other examinations unremarkable   Selected Labs (Up to last 3 results from past 72 hours) Report       10/21 1823   10/22 0600   10/23 0520      WBC 3.6   3.3   3.5      RBC 3.94  4.09  3.64      Hemoglobin 10.8   11.2   10.1      HCT 34.0   35.4   31.3      Platelets 158  159  145      Sodium 143  144       Potassium 3.9  3.4        Chloride 100  101       CO2 32  31       BUN 51   43        Creatinine 2.62   2.21        Calcium 9.8  9.2       Glucose 116   162

## 2013-08-12 NOTE — Progress Notes (Signed)
08/12/13 nsg To unit 6E alert but confused patient accompanied by husband and RN. Oriented to unit setup for the husband. Placed on bed alarm. No skin issues noted except dry skin . Patient not on telemetry

## 2013-08-12 NOTE — Progress Notes (Signed)
INITIAL NUTRITION ASSESSMENT  DOCUMENTATION CODES Per approved criteria  -Severe malnutrition in the context of chronic illness   INTERVENTION:  Resource Breeze po TID, each supplement provides 250 kcal and 9 grams of protein.  NUTRITION DIAGNOSIS: Unintended weight loss related to increased nutrition needs as evidenced by 6% weight loss in one month.   Goal: Intake to meet >90% of estimated nutrition needs.  Monitor:  PO intake, supplement tolerance, labs, weight trend.  Reason for Assessment: Low Braden  72 y.o. female  Admitting Dx: altered mental status  ASSESSMENT: Patient with a history of renal transplant in 2008 and underlying dementia who presented to ED with altered mental status. S/P lumbar puncture; CSF suggestive of possible viral meningitis/encephalitis.   Husband reports that he assists patient with feeding. She is able to hold a cup and drink through a straw. She has tried Ensure, Nepro, and Boost in the past and does not like it. She tried and likes Radio [employer].   Patient has lost a lot of weight.  Nutrition Focused Physical Exam:  Subcutaneous Fat:  Orbital Region: WNL Upper Arm Region: WNL Thoracic and Lumbar Region: WNL  Muscle:  Temple Region: WNL Clavicle Bone Region: mild-moderate depletion Clavicle and Acromion Bone Region: mild-moderate depletion Scapular Bone Region: WNL Dorsal Hand: WNL Patellar Region: mild-moderate depletion Anterior Thigh Region: mild-moderate depletion Posterior Calf Region: severe depletion  Edema: none   Pt meets criteria for severe MALNUTRITION in the context of chronic illness as evidenced by severe loss of muscle mass and 6% weight loss in one month.  Height: Ht Readings from Last 1 Encounters:  08/11/13 5\' 3"  (1.6 m)    Weight: Wt Readings from Last 1 Encounters:  08/12/13 130 lb 4.7 oz (59.1 kg)    Ideal Body Weight: 52.3 kg  % Ideal Body Weight: 113%  Wt Readings from Last 10  Encounters:  08/12/13 130 lb 4.7 oz (59.1 kg)  07/04/13 139 lb 1.8 oz (63.1 kg)  05/18/13 140 lb (63.504 kg)  01/15/13 149 lb (67.586 kg)  01/07/13 153 lb 6.4 oz (69.582 kg)  08/22/12 150 lb (68.04 kg)  06/04/12 155 lb 11.2 oz (70.625 kg)  04/30/12 198 lb 10.2 oz (90.1 kg)  04/28/12 198 lb 10.2 oz (90.1 kg)  04/28/12 198 lb 10.2 oz (90.1 kg)    Usual Body Weight: 139 lb (1 month ago)  % Usual Body Weight: 94%  BMI:  Body mass index is 23.09 kg/(m^2).  Estimated Nutritional Needs: Kcal: 4782-9562 Protein: 80-90 gm Fluid: 1.6-1.8 L  Skin: no wounds  Diet Order: General  Lunch tray at bedside, 100% completion.  EDUCATION NEEDS: -Education needs addressed, discussed ways to increase oral intake of calories and protein to prevent further weight loss and depletion of muscle and fat mass.   Intake/Output Summary (Last 24 hours) at 08/12/13 1445 Last data filed at 08/12/13 1300  Gross per 24 hour  Intake 2091.3 ml  Output      0 ml  Net 2091.3 ml    Last BM: PTA   Labs:   Recent Labs Lab 08/10/13 1823 08/11/13 0600 08/12/13 0520  NA 143 144 145  K 3.9 3.4* 3.4*  CL 100 101 105  CO2 32 31 29  BUN 51* 43* 38*  CREATININE 2.62* 2.21* 1.98*  CALCIUM 9.8 9.2 8.6  GLUCOSE 116* 162* 134*    CBG (last 3)   Recent Labs  08/12/13 0817 08/12/13 1233 08/12/13 1315  GLUCAP 93 184* 181*  Scheduled Meds: . acyclovir  10 mg/kg Intravenous Q24H  . allopurinol  150 mg Oral Daily  . amLODipine  10 mg Oral Daily  . calcitRIOL  0.5 mcg Oral Q breakfast  . enoxaparin (LOVENOX) injection  30 mg Subcutaneous Daily  . insulin aspart  0-9 Units Subcutaneous TID WC  . insulin detemir  16 Units Subcutaneous Daily  . latanoprost  1 drop Both Eyes QHS  . levothyroxine  224 mcg Oral QAC breakfast  . mycophenolate  500 mg Oral BID  . predniSONE  10 mg Oral Q breakfast  . sirolimus  3 mg Oral Q2000    Continuous Infusions: . sodium chloride 75 mL/hr at 08/11/13 2253     Past Medical History  Diagnosis Date  . Diabetes mellitus   . Diabetic neuropathy   . Hypothyroidism   . Hypertension   . GERD (gastroesophageal reflux disease)   . Heart murmur     not treated for  . Arthritis   . Chronic kidney disease     RENAL TRANSPLANT IN 2008--PT IS FOLLOWED BY DR. Karie Fetch WITH BUN OF 37 AND CREAT 1.7--PER NOTE DR. NESI FROM 04/11/12  . Urethral diverticulum   . Eyesight diminished     GLAUCOMA   . History of shingles     LEFT EYE JAN 2012--STILL HAS EYE PAIN-STATES NOT ABLE TO TAKE THE MEDICATION FOR SHINGLES BECAUSE OF HER HX OF KIDNEY TRANSPLANT  . Fingernail abnormalities     FUNGUS OF FINGERNAILS  . UTI (urinary tract infection) 05/18/2013    Past Surgical History  Procedure Laterality Date  . Kidney transplant  2008    At [facility]  . Back surgery      ruptured disk  . Abdominal hysterectomy    . Appendectomy    . Laser surgery of both eyes for hemorrhages    . 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 Urethral diverticulectomy  04/28/2012    Procedure: URETHRAL DIVERTICULECTOMY;  Surgeon: Antony Haste, MD;  Location: [facility];  Service: Urology;  Laterality: N/A;    Joaquin Courts, RD, LDN, CNSC Pager [phone] After Hours Pager [phone]

## 2013-08-12 NOTE — Progress Notes (Signed)
See progress note.

## 2013-08-12 NOTE — Progress Notes (Signed)
Subjective: Much more alert this am.  Husband reports that she had waxing and waning levels of alertness and confusion through the day yesterday.  No complaints currently.  Denies pain.    Objective: Vital signs in last 24 hours: Temp:  [97.8 F (36.6 C)-99.8 F (37.7 C)] 97.8 F (36.6 C) (10/23 0400) Pulse Rate:  [77-89] 83 (10/23 0400) Resp:  [14-28] 22 (10/23 0400) BP: (138-173)/(47-92) 158/53 mmHg (10/23 0400) SpO2:  [92 %-99 %] 98 % (10/23 0400) Weight:  [59.1 kg (130 lb 4.7 oz)] 59.1 kg (130 lb 4.7 oz) (10/23 0400) Weight change: -4 kg (-8 lb 13.1 oz)    CBG (last 3)   Recent Labs  08/11/13 1405 08/11/13 1607 08/11/13 2154  GLUCAP 231* 291* 293*    Intake/Output from previous day: 10/22 0701 - 10/23 0700 In: 823.8 [I.V.:773.8; IV Piggyback:50] Out: -  Intake/Output this shift:    General appearance: alert and disoriented Eyes: no scleral icterus; left ptosis with periorbital edema Throat: oropharynx moist without erythema Resp: clear to auscultation bilaterally Cardio: regular rate and rhythm GI: soft, non-tender; bowel sounds normal; no masses,  no organomegaly Extremities: no clubbing, cyanosis or edema   Lab Results:  Recent Labs  08/10/13 1823 08/11/13 0600  NA 143 144  K 3.9 3.4*  CL 100 101  CO2 32 31  GLUCOSE 116* 162*  BUN 51* 43*  CREATININE 2.62* 2.21*  CALCIUM 9.8 9.2    Recent Labs  08/10/13 1823 08/11/13 0600  AST 15 16  ALT 10 9  ALKPHOS 42 42  BILITOT 0.6 0.6  PROT 6.7 6.4  ALBUMIN 3.7 3.4*    Recent Labs  08/11/13 0600 08/12/13 0520  WBC 3.3* 3.5*  NEUTROABS 2.9 2.6  HGB 11.2* 10.1*  HCT 35.4* 31.3*  MCV 86.6 86.0  PLT 159 145*   Lab Results  Component Value Date   INR 1.0 02/19/2009   INR 0.9 04/02/2007   No results found for this basename: CKTOTAL, CKMB, CKMBINDEX, TROPONINI,  in the last 72 hours No results found for this basename: TSH, T4TOTAL, FREET3, T3FREE, THYROIDAB,  in the last 72 hours No results  found for this basename: VITAMINB12, FOLATE, FERRITIN, TIBC, IRON, RETICCTPCT,  in the last 72 hours  Studies/Results: Dg Chest 2 View  08/10/2013   CLINICAL DATA:  Weakness.  EXAM: CHEST  2 VIEW  COMPARISON:  07/01/2013  FINDINGS: Two views of the chest demonstrate low lung volumes. Slightly prominent right central vascular structures are unchanged. No focal airspace disease. Heart size is stable. Patient is rotated towards the left.  IMPRESSION: Low lung volumes without focal disease.   Electronically Signed   By: Richarda Overlie M.D.   On: 08/10/2013 19:05   Ct Head Wo Contrast  08/10/2013   CLINICAL DATA:  Altered mental status, weakness  EXAM: CT HEAD WITHOUT CONTRAST  TECHNIQUE: Contiguous axial images were obtained from the base of the skull through the vertex without intravenous contrast.  COMPARISON:  Head CT 06/27/2013, 05/21/2013  FINDINGS: No acute intracranial hemorrhage. No focal mass lesion. No CT evidence of acute infarction. No midline shift or mass effect. No hydrocephalus. Basilar cisterns are patent. Mild atrophy and microvascular disease unchanged.  Paranasal sinuses and mastoid air cells are clear.  IMPRESSION: 1. No acute intracranial findings.  2. Unchanged from multiple head CT comparisons.   Electronically Signed   By: Genevive Bi M.D.   On: 08/10/2013 19:16   Dg Lumbar Puncture Fluoro Guide  08/10/2013  CLINICAL DATA:  Altered mental status  EXAM: DIAGNOSTIC LUMBAR PUNCTURE UNDER FLUOROSCOPIC GUIDANCE  FLUOROSCOPY TIME:  Zero Min and 36 seconds  PROCEDURE: Informed consent was obtained from the patient prior to the procedure, including potential complications of headache, allergy, and pain. With the patient prone, the lower back was prepped with Betadine. 1% Lidocaine was used for local anesthesia. Lumbar puncture was performed at the L3-4 level using a gauge needle with return of clear CSF with an opening pressure of 14 cm water. 4ml of CSF were obtained for laboratory  studies. The patient tolerated the procedure well and there were no apparent complications. The patient was uncooperative and began moving during collection of fluid. The needle was dislodged from the subarachnoid space and subsequently removed.  IMPRESSION: Lumbar puncture with clear CSF.   Electronically Signed   By: Marlan Palau M.D.   On: 08/10/2013 23:46     Medications: Scheduled: . acyclovir  10 mg/kg Intravenous Q24H  . allopurinol  150 mg Oral Daily  . calcitRIOL  0.5 mcg Oral Q breakfast  . enoxaparin (LOVENOX) injection  30 mg Subcutaneous Daily  . insulin aspart  0-9 Units Subcutaneous TID WC  . insulin detemir  10 Units Subcutaneous QHS  . latanoprost  1 drop Both Eyes QHS  . levothyroxine  224 mcg Oral QAC breakfast  . mycophenolate  500 mg Oral BID  . predniSONE  10 mg Oral Q breakfast  . sirolimus  3 mg Oral Q2000   Continuous: . sodium chloride 75 mL/hr at 08/11/13 2253    Assessment/Plan: Active Problems: 1. Viral Encephalitis/Metabolic encephalopathy-36 hours of declining mental status associated with low grade fever in immunosuppressed patient- CSF suggestive of possible viral meningitis/encephalitis. Continue empiric Acyclovir pending CSF HSV and Enterovirus. Other antibiotics discontinued per ID recommendations- appreciate guidance.  Continue supportive care. Narcotics held for now- they may be contributing to delirium at home. 2. Acute Renal Failure/History of renal transplant/ Immunosuppressed status- labs pending this am- if no improvement with hydration will consult nephrology.   3. Diabetes mellitus type 2, uncontrolled- Increase Levemir to 16 units daily. No further hypoglycemia. Continue SSI. 4. Weakness- continue PT/OT.  Husband prefers to take her home. 5. Hypertension- resume Amlodipine.  Remains off Labetalol. 6. Pancytopenia- may be secondary to viral illness vs. Immunosuppressants. Probable dilutional component as well.  Monitor. 7. Disposition-  possible discharge tomorrow if stable.  Transfer to floor today.   LOS: 2 days   Henderson Frampton,W DOUGLAS 08/12/2013, 7:29 AM

## 2013-08-12 NOTE — Evaluation (Signed)
Physical Therapy Evaluation Patient Details Name: [name] MRN: [id number] DOB: 1941/04/30 Today's Date: 08/12/2013 Time: 0950-1015 PT Time Calculation (min): 25 min  PT Assessment / Plan / Recommendation History of Present Illness  This 72 y.o.female admitted 08/10/13 with altered mental status since yesterday evening. She was unresponsive to verbal command and confused. This was associated with one episode of vomiting but no fever, neck stiffness or headache. Her husband states that she was able to respond to some of his questions earlier this morning but continues to be confused. At the hospital, she has been put on meropenem, clindamycin, acyclovir and prednisone. A LP was done yesterday (08/10/13). The CSF culture showed some WBC, predominantly PMNs but no organisms were seen.  Her past medical history is significant for a renal transplant in 2008 for which she is on long term prednisone use.  Viral Encephalitis/Metabolic encephalopathy-36 hours of declining mental status associated with low grade fever in immunosuppressed patient- CSF suggestive of possible viral meningitis/encephalitis.  Clinical Impression  Pt admitted with the above. Pt currently with functional limitations due to the deficits listed below (see PT Problem List). Pt reported pt is near baseline of function however husband stated "She is a little weaker than her normal."  Pt will benefit from skilled PT to increase their independence and safety with mobility to allow discharge to the venue listed below.      PT Assessment  Patient needs continued PT services    Follow Up Recommendations  Home health PT;Supervision/Assistance - 24 hour (HH aide)    Equipment Recommendations  None recommended by PT    Frequency Min 3X/week    Precautions / Restrictions Precautions Precautions: Fall   Pertinent Vitals/Pain No c/o pain      Mobility  Bed Mobility Bed Mobility: Not assessed Transfers Transfers: Sit to  Stand;Stand to Sit Sit to Stand: 2: Max assist;With upper extremity assist;From bed;From chair/3-in-1 Stand to Sit: 2: Max assist;With upper extremity assist;To chair/3-in-1 Details for Transfer Assistance: Pt performed several sit <> stands with husband present.  Therapist assisted pt to stand while husband performed BADLs.  Pt unable to stand completely upright and remains in flexed posture.  Husband demonstrated sit <> stand transfer and able to complete without any VCs.  Ambulation/Gait Ambulation/Gait Assistance: Not tested (comment)    Exercises     PT Diagnosis: Generalized weakness  PT Problem List: Decreased strength;Decreased activity tolerance;Decreased balance;Decreased mobility;Decreased knowledge of use of DME;Decreased cognition;Decreased safety awareness PT Treatment Interventions: DME instruction;Functional mobility training;Therapeutic activities;Therapeutic exercise;Balance training;Patient/family education     PT Goals(Current goals can be found in the care plan section) Acute Rehab PT Goals Patient Stated Goal: Pt unable to state.  Spouse plans to take pt home PT Goal Formulation: With patient/family Time For Goal Achievement: 08/19/13 Potential to Achieve Goals: Fair  Visit Information  Last PT Received On: 08/12/13 Assistance Needed: +1 (bed to chair only) History of Present Illness: This 72 y.o.female admitted 08/10/13 with altered mental status since yesterday evening. She was unresponsive to verbal command and confused. This was associated with one episode of vomiting but no fever, neck stiffness or headache. Her husband states that she was able to respond to some of his questions earlier this morning but continues to be confused. At the hospital, she has been put on meropenem, clindamycin, acyclovir and prednisone. A LP was done yesterday (08/10/13). The CSF culture showed some WBC, predominantly PMNs but no organisms were seen.  Her past medical history is  significant  for a renal transplant in 2008 for which she is on long term prednisone use.  Viral Encephalitis/Metabolic encephalopathy-36 hours of declining mental status associated with low grade fever in immunosuppressed patient- CSF suggestive of possible viral meningitis/encephalitis.       Prior Functioning  Home Living Family/patient expects to be discharged to:: Private residence Living Arrangements: Spouse/significant other Available Help at Discharge: Family;Available 24 hours/day Type of Home: House Home Access: Stairs to enter [employer] of Steps: 2 (has portable ramp) Entrance Stairs-Rails: None Home Layout: One level Home Equipment: Walker - 4 wheels;Bedside commode;Grab bars - tub/shower;Wheelchair - power Additional Comments: Pt transfers bed to wheelchair, w/c to [facility] only.  Spouse reports she only takes a couple of steps with rollator Prior Function Level of Independence: Needs assistance Gait / Transfers Assistance Needed: max A - spouse reports he does ~60% ADL's / Homemaking Assistance Needed: pt is max A with UB ADLs and total A with LB ADLs.  Max A with stand pivot transfer to w/c.  She will feed self after set up Communication / Swallowing Assistance Needed: limited verbalization Comments: pt could walk to the bathroom with the walker without help when she was doing good, over last month pt has needed more assist for mobility and ADLs per spouse Communication Communication:  (limited verbalization) Dominant Hand: Right    Cognition  Cognition Arousal/Alertness: Awake/alert Behavior During Therapy: Flat affect Area of Impairment: Attention;Following commands Current Attention Level: Sustained Following Commands: Follows one step commands consistently    Extremity/Trunk Assessment Lower Extremity Assessment Lower Extremity Assessment: Generalized weakness Cervical / Trunk Assessment Cervical / Trunk Assessment: Kyphotic   Balance Balance Balance  Assessed: Yes Static Standing Balance Static Standing - Balance Support: Bilateral upper extremity supported Static Standing - Level of Assistance: 2: Max assist Static Standing - Comment/# of Minutes: ~15 seconds to complete pericare and to place recliner behind pt. Pt unable to remain standing longer than 15 seconds and needed to return to sitting.   End of Session PT - End of Session Equipment Utilized During Treatment: Gait belt Activity Tolerance: Patient limited by fatigue Patient left: in chair;with call bell/phone within reach;with family/visitor present Nurse Communication: Mobility status  GP     Krystie Leiter 08/12/2013, 10:38 AM Jake Shark, PT DPT [phone]

## 2013-08-13 ENCOUNTER — Inpatient Hospital Stay (HOSPITAL_COMMUNITY): Payer: Medicare Other

## 2013-08-13 DIAGNOSIS — E43 Unspecified severe protein-calorie malnutrition: Secondary | ICD-10-CM | POA: Diagnosis present

## 2013-08-13 LAB — COMPREHENSIVE METABOLIC PANEL
AST: 31 U/L (ref 0–37)
Albumin: 3 g/dL — ABNORMAL LOW (ref 3.5–5.2)
Alkaline Phosphatase: 30 U/L — ABNORMAL LOW (ref 39–117)
Calcium: 9.1 mg/dL (ref 8.4–10.5)
Chloride: 103 mEq/L (ref 96–112)
Potassium: 5 mEq/L (ref 3.5–5.1)
Sodium: 141 mEq/L (ref 135–145)
Total Bilirubin: 0.5 mg/dL (ref 0.3–1.2)

## 2013-08-13 LAB — CBC WITH DIFFERENTIAL/PLATELET
Basophils Absolute: 0 10*3/uL (ref 0.0–0.1)
Eosinophils Absolute: 0 10*3/uL (ref 0.0–0.7)
Eosinophils Relative: 0 % (ref 0–5)
HCT: 35.7 % — ABNORMAL LOW (ref 36.0–46.0)
Lymphs Abs: 0.4 10*3/uL — ABNORMAL LOW (ref 0.7–4.0)
MCH: 27.2 pg (ref 26.0–34.0)
MCV: 84.4 fL (ref 78.0–100.0)
Monocytes Absolute: 0.2 10*3/uL (ref 0.1–1.0)
Platelets: 200 10*3/uL (ref 150–400)
RDW: 17 % — ABNORMAL HIGH (ref 11.5–15.5)

## 2013-08-13 LAB — GLUCOSE, CAPILLARY
Glucose-Capillary: 165 mg/dL — ABNORMAL HIGH (ref 70–99)
Glucose-Capillary: 122 mg/dL — ABNORMAL HIGH (ref 70–99)
Glucose-Capillary: 128 mg/dL — ABNORMAL HIGH (ref 70–99)

## 2013-08-13 MED ORDER — LOPERAMIDE HCL 2 MG PO CAPS
2.0000 mg | ORAL_CAPSULE | Freq: Three times a day (TID) | ORAL | Status: DC | PRN
  Administered 2013-08-13 – 2013-08-16 (×3): 2 mg via ORAL
  Filled 2013-08-13 (×4): qty 1

## 2013-08-13 MED ORDER — INSULIN DETEMIR 100 UNIT/ML ~~LOC~~ SOLN
18.0000 [IU] | Freq: Every day | SUBCUTANEOUS | Status: DC
  Administered 2013-08-13 – 2013-08-17 (×5): 18 [IU] via SUBCUTANEOUS
  Filled 2013-08-13 (×5): qty 0.18

## 2013-08-13 NOTE — Progress Notes (Signed)
[facility]  Date of Admission:  08/10/2013  Antibiotics: Antibiotics Given (last 72 hours)   Date/Time Action Medication Dose Rate   08/11/13 0249 Given   acyclovir (ZOVIRAX) 630 mg in dextrose 5 % 100 mL IVPB 630 mg 112.6 mL/hr   08/11/13 0355 Given   meropenem (MERREM) 500 mg in sodium chloride 0.9 % 50 mL IVPB 500 mg 100 mL/hr   08/11/13 1034 Given   meropenem (MERREM) 500 mg in sodium chloride 0.9 % 50 mL IVPB 500 mg 100 mL/hr   08/12/13 4098 Given   acyclovir (ZOVIRAX) 565 mg in dextrose 5 % 100 mL IVPB 565 mg 111.3 mL/hr   08/13/13 1191 Given   acyclovir (ZOVIRAX) 565 mg in dextrose 5 % 100 mL IVPB 565 mg 111.3 mL/hr      Subjective: Some diarrhea  Objective: Temp:  [98 F (36.7 C)-99.8 F (37.7 C)] 99.4 F (37.4 C) (10/24 0454) Pulse Rate:  [78-83] 83 (10/24 0454) Resp:  [16-20] 16 (10/24 0454) BP: (137-166)/(50-72) 156/70 mmHg (10/24 0454) SpO2:  [95 %-98 %] 95 % (10/24 0454) Weight:  [130 lb 4.7 oz (59.1 kg)] 130 lb 4.7 oz (59.1 kg) (10/23 2020)  General: awake, alert Skin: no rashes Lungs: CTA B Cor: RRR Abdomen: soft, nt, nd Ext: no edema Neuro: moving all extremities  Lab Results Lab Results  Component Value Date   WBC 3.5* 08/12/2013   HGB 10.1* 08/12/2013   HCT 31.3* 08/12/2013   MCV 86.0 08/12/2013   PLT 145* 08/12/2013    Lab Results  Component Value Date   CREATININE 1.98* 08/12/2013   BUN 38* 08/12/2013   NA 145 08/12/2013   K 3.4* 08/12/2013   CL 105 08/12/2013   CO2 29 08/12/2013    Lab Results  Component Value Date   ALT 8 08/12/2013   AST 13 08/12/2013   ALKPHOS 39 08/12/2013   BILITOT 0.4 08/12/2013      Microbiology: Recent Results (from the past 240 hour(s))  URINE CULTURE     Status: None   Collection Time    08/10/13  7:26 PM      Result Value Range Status   Specimen Description URINE, CATHETERIZED   Final   Special Requests NONE   Final   Culture  Setup Time     Final   Value:  08/10/2013 20:53     Performed at [facility] Count     Final   Value: NO GROWTH     Performed at [facility]   Culture     Final   Value: NO GROWTH     Performed at [facility]   Report Status 08/11/2013 FINAL   Final  CSF CULTURE     Status: None   Collection Time    08/10/13 10:30 PM      Result Value Range Status   Specimen Description CSF   Final   Special Requests Immunocompromised   Final   Gram Stain     Final   Value: CYTOSPIN WBC PRESENT, PREDOMINANTLY MONONUCLEAR     NO ORGANISMS SEEN     Performed at [facility]     Performed at [facility]   Culture     Final   Value: NO GROWTH 1 DAY     Performed at [facility]   Report Status PENDING   Incomplete  GRAM STAIN     Status: None  Collection Time    08/10/13 10:30 PM      Result Value Range Status   Specimen Description CSF   Final   Special Requests NONE   Final   Gram Stain     Final   Value: CYTOSPIN     WBC PRESENT, PREDOMINANTLY MONONUCLEAR     NO ORGANISMS SEEN   Report Status 08/10/2013 FINAL   Final  MRSA PCR SCREENING     Status: None   Collection Time    08/11/13  1:50 AM      Result Value Range Status   MRSA by PCR NEGATIVE  NEGATIVE Final   Comment:            The GeneXpert MRSA Assay (FDA     approved for NASAL specimens     only), is one component of a     comprehensive MRSA colonization     surveillance program. It is not     intended to diagnose MRSA     infection nor to guide or     monitor treatment for     MRSA infections.  CLOSTRIDIUM DIFFICILE BY PCR     Status: None   Collection Time    09/03/2013  6:10 AM      Result Value Range Status   C difficile by pcr NEGATIVE  NEGATIVE Final    Studies/Results: Dg Abd 2 Views  09/03/2013   CLINICAL DATA:  72 year old female with abdominal pain. Initial encounter.  EXAM: ABDOMEN - 2 VIEW  COMPARISON:  CT Abdomen and Pelvis 07/19/2010.  FINDINGS: Rim calcified right upper pole  renal lesion is visible radiographically. Aortoiliac calcified atherosclerosis noted. Non obstructed bowel gas pattern. Advanced lower lumbar disc degeneration with vacuum disc phenomena. No acute osseous abnormality identified. No pneumoperitoneum on left-side-down lateral decubitus view.  IMPRESSION: 1. Non obstructed bowel gas pattern, no free air.  2. Chronic rim calcified right upper pole renal lesion. Advanced aortoiliac calcified atherosclerosis.   Electronically Signed   By: Augusto Gamble M.D.   On: 09-03-13 09:07    Assessment/Plan: 1)  Encephalitis vs metabolic encephalopathy - viral with lymphoctyes in CSF, medications as well.  Seems to be resolving.  HSV PCR negative so will d/c acyclovir.   -patient near baseline per husband -he plans on taking her home rather than SNF  I will sign off, thanks for consult and call with questions  Staci Righter, MD [email or web address] [phone] pager   [phone] cell 09/03/2013, 10:29 AM

## 2013-08-13 NOTE — Progress Notes (Addendum)
Subjective: Some lower stomach pain and anorexia today. No vomiting. Cdiff sent  No back pain Objective: Vital signs in last 24 hours: Temp:  [98 F (36.7 C)-99.8 F (37.7 C)] 99.4 F (37.4 C) (10/24 0454) Pulse Rate:  [78-83] 83 (10/24 0454) Resp:  [16-20] 16 (10/24 0454) BP: (137-166)/(50-72) 156/70 mmHg (10/24 0454) SpO2:  [95 %-98 %] 95 % (10/24 0454) Weight:  [59.1 kg (130 lb 4.7 oz)] 59.1 kg (130 lb 4.7 oz) (10/23 2020)  Intake/Output from previous day: 10/23 0701 - 10/24 0700 In: 1490 [P.O.:480; I.V.:1010] Out: -  Intake/Output this shift:    Non toxic, mild left periorbital swelling. Lungs clear ht regular distant. abd min distention. No masses or pulsations. Non tender. Good BS's  Awake, recognizes me. Mentation close to her baseline  Lab Results   Recent Labs  08/11/13 0600 08/12/13 0520  WBC 3.3* 3.5*  RBC 4.09 3.64*  HGB 11.2* 10.1*  HCT 35.4* 31.3*  MCV 86.6 86.0  MCH 27.4 27.7  RDW 17.2* 17.5*  PLT 159 145*    Recent Labs  08/11/13 0600 08/12/13 0520  NA 144 145  K 3.4* 3.4*  CL 101 105  CO2 31 29  GLUCOSE 162* 134*  BUN 43* 38*  CREATININE 2.21* 1.98*  CALCIUM 9.2 8.6    Studies/Results: No results found.  Scheduled Meds: . acyclovir  10 mg/kg Intravenous Q24H  . allopurinol  150 mg Oral Daily  . amLODipine  10 mg Oral Daily  . calcitRIOL  0.5 mcg Oral Q breakfast  . enoxaparin (LOVENOX) injection  30 mg Subcutaneous Daily  . feeding supplement (RESOURCE BREEZE)  1 Container Oral TID BM  . insulin aspart  0-9 Units Subcutaneous TID WC  . insulin detemir  16 Units Subcutaneous Daily  . latanoprost  1 drop Both Eyes QHS  . levothyroxine  224 mcg Oral QAC breakfast  . mycophenolate  500 mg Oral BID  . predniSONE  10 mg Oral Q breakfast  . sirolimus  3 mg Oral Q2000   Continuous Infusions: . sodium chloride 75 mL/hr at 08/12/13 2000   PRN Meds:acetaminophen, acetaminophen, ondansetron (ZOFRAN) IV, ondansetron  Assessment/Plan:  . Viral Encephalitis/Metabolic encephalopathy- Mentation is close to her baseline. HSV negative. Enterovirus pending. Doubt an acute viral process Continue empiric Acyclovir pending CSF HSV and Enterovirus.   Narcotics held for now- doing OK from a pain perspective 2. Acute Renal Failure/History of renal transplant/ Immunosuppressed status- Cr better at 1.98 3. Diabetes mellitus type 2, uncontrolled- Increase Levemir to 16 units daily.FBS up at 303 4. Weakness- continue PT/OT. Husband prefers to take her home but doubt this is feasible 5. Hypertension- resume Amlodipine. Remains off Labetalol.  6. Pancytopenia- may be secondary to viral illness vs. Immunosuppressants. Probable dilutional component as well. Monitor.  7. Disposition- would prefer SNF ABDOMINAL PAIN: check XR.     LOS: 3 days   [name] 08/13/2013, 8:24 AM

## 2013-08-13 NOTE — ED Provider Notes (Addendum)
Date: 08/10/2013  Rate: 69  Rhythm: normal sinus rhythm  QRS Axis: normal  Intervals: normal  ST/T Wave abnormalities: V5 and V6 previously inverted, now upright  Conduction Disutrbances:none  Old EKG Reviewed: changes noted  I performed a history and physical examination of  [name] and discussed her management with Dr. Ladona Horns.. I agree with the history, physical, assessment, and plan of care, with the following exceptions: None I was present for the following procedures: None  Time Spent in Critical Care of the patient: None  Time spent in discussions with the patient and family: 30 minutes  [name]  Pt with transplant hx, immunosuppressed, hx of UTI comes in with cc of Altered mental status. Exam shows encephalopathy, no focal deficit, or signs of infection. Antibiotics started in the ED.  Her LP was unsuccessful - IR to complete it. Admitted by [facility]   LUMBAR PUNCTURE Date/Time: 08/11/2013 8:00 PM Performed by: Derwood Kaplan Authorized by: Derwood Kaplan Consent: Verbal consent obtained. Risks and benefits: risks, benefits and alternatives were discussed Consent given by: patient and spouse Required items: required blood products, implants, devices, and special equipment available Patient identity confirmed: arm band Indications: evaluation for infection and evaluation for altered mental status Anesthesia: local infiltration Local anesthetic: lidocaine 1% with epinephrine Anesthetic total: 2 ml Patient sedated: yes Sedation type: anxiolysis Sedatives: midazolam Vitals: Vital signs were monitored during sedation. Preparation: Patient was prepped and draped in the usual sterile fashion. Lumbar space: L4-L5 interspace Patient's position: sitting Needle gauge: 18 Needle type: spinal needle - Quincke tip Number of attempts: 4 Comments: UNSUCCESSFUL ATTEMPT    Derwood Kaplan, MD 08/13/13 0865  Derwood Kaplan, MD 08/13/13 7846

## 2013-08-13 NOTE — Progress Notes (Signed)
Patient ID: [name], female   DOB: Oct 04, 1941, 72 y.o.   MRN: [id number]  Plan: Discontinue acyclovir Chase enterovirus PCR Chase C. Diff stool  Assessment: Encephalitis vs metabolic encephalopathy resolving   Subjectively: She says she is doing well and has no complaints other than abdominal discomfort. Her husband states that she had one episode of vomiting this morning. There are no complaints of fever, chills or sweats. In addition, he says that her orientation level has returned to baseline.    Objectively: Last recorded: 10/24 0454   BP: 156/70 Pulse: 83  Temp: 99.4 F (37.4 C) Resp: 16  SpO2: 95      CVS: S1+S2+0 Resp: normal vesicular breathing, no added sounds Abdomen: soft, non tender, gs +ve Other exams unremarkable   Selected Labs (Up to last 3 results from past 72 hours) Report       10/21 1823   10/22 0600   10/23 0520      WBC 3.6   3.3   3.5      RBC 3.94  4.09  3.64      Hemoglobin 10.8   11.2   10.1      HCT 34.0   35.4   31.3      Platelets 158  159  145      Sodium 143  144  145     Potassium 3.9  3.4   3.4      Chloride 100  101  105     CO2 32  31  29     BUN 51   43   38      Creatinine 2.62   2.21   1.98      Calcium 9.8  9.2  8.6     Glucose 116   162   134      Herpes simplex virus(hsv) dna by pcr [[id number]] Collected: 08/10/13 2230 Updated: 08/12/13 1515 Specimen Type: Cerebrospinal Fluid Specimen source hsv CSF HSV 1 DNA Not Detected HSV 2 DNA Not Detected   CSF culture [[id number]] Collected: 08/10/13 2230 Order Status: Completed Updated: 08/12/13 1420 Specimen Information: Cerebrospinal Fluid Specimen Description CSF Special Requests Immunocompromised Gram Stain - Result: CYTOSPIN WBC PRESENT, PREDOMINANTLY MONONUCLEAR NO ORGANISMS SEEN Performed at [facility] Performed at [facility] Culture - Result: NO GROWTH 1 DAY Performed at [facility] Report Status PENDING   Urine culture [[id number]]  Collected: 08/10/13 1926 Order Status: Completed Updated: 08/11/13 1725 Specimen Information: Urine / Urine, Catheterized Specimen Description URINE, CATHETERIZED Special Requests NONE Culture Setup Time - Result: 08/10/2013 20:53 Performed at [facility] Count - Result: NO GROWTH Performed at [facility] Culture - Result: NO GROWTH Performed at [facility]

## 2013-08-13 NOTE — Progress Notes (Signed)
Occupational Therapy Treatment Patient Details Name: [name] MRN: [id number] DOB: 07/24/1941 Today's Date: 08/13/2013 Time: 1191-4782 OT Time Calculation (min): 12 min  OT Assessment / Plan / Recommendation  History of present illness This 72 y.o.female admitted 08/10/13 with altered mental status since yesterday evening. She was unresponsive to verbal command and confused. This was associated with one episode of vomiting but no fever, neck stiffness or headache. Her husband states that she was able to respond to some of his questions earlier this morning but continues to be confused. At the hospital, she has been put on meropenem, clindamycin, acyclovir and prednisone. A LP was done yesterday (08/10/13). The CSF culture showed some WBC, predominantly PMNs but no organisms were seen.  Her past medical history is significant for a renal transplant in 2008 for which she is on long term prednisone use.  Viral Encephalitis/Metabolic encephalopathy-36 hours of declining mental status associated with low grade fever in immunosuppressed patient- CSF suggestive of possible viral meningitis/encephalitis.   OT comments  Pt continues to require extensive assist with functional mobility. Pt's spouse in primary caregiver at home and plans to take her home with [facility] aide assist. Pt's spouse states that pt has become weak and that prior to hospitalization she was able to assist with ADLs  Follow Up Recommendations  Home health OT;Supervision/Assistance - 24 hour    Barriers to Discharge   None    Equipment Recommendations  None recommended by OT    Recommendations for Other Services    Frequency Min 2X/week   Progress towards OT Goals Progress towards OT goals: OT to reassess next treatment  Plan Discharge plan remains appropriate    Precautions / Restrictions Precautions Precautions: Fall Restrictions Weight Bearing Restrictions: No   Pertinent Vitals/Pain Pt states that her stomach hurst but was  unable to rate    ADL  Toilet Transfer: Performed;Maximal assistance Toilet Transfer Method: Stand pivot;Sit to Barista: Bedside commode Toileting - Clothing Manipulation and Hygiene: +1 Total assistance Where Assessed - Toileting Clothing Manipulation and Hygiene: Sit on 3-in-1 or toilet;Lean right and/or left Transfers/Ambulation Related to ADLs: max A stand pivot transfer    OT Diagnosis:    OT Problem List:   OT Treatment Interventions:     OT Goals(current goals can now be found in the care plan section)    Visit Information  Last OT Received On: 08/13/13 Assistance Needed: +1 History of Present Illness: This 72 y.o.female admitted 08/10/13 with altered mental status since yesterday evening. She was unresponsive to verbal command and confused. This was associated with one episode of vomiting but no fever, neck stiffness or headache. Her husband states that she was able to respond to some of his questions earlier this morning but continues to be confused. At the hospital, she has been put on meropenem, clindamycin, acyclovir and prednisone. A LP was done yesterday (08/10/13). The CSF culture showed some WBC, predominantly PMNs but no organisms were seen.  Her past medical history is significant for a renal transplant in 2008 for which she is on long term prednisone use.  Viral Encephalitis/Metabolic encephalopathy-36 hours of declining mental status associated with low grade fever in immunosuppressed patient- CSF suggestive of possible viral meningitis/encephalitis.    Subjective Data      Prior Functioning       Cognition  Cognition Arousal/Alertness: Awake/alert Behavior During Therapy: Flat affect Area of Impairment: Attention;Following commands Current Attention Level: Sustained Following Commands: Follows one step commands consistently  Mobility  Bed Mobility Supine to Sit: 2: Max assist Sitting - Scoot to [place] of Bed: 2: Max  assist Transfers Transfers: Sit to Stand;Stand to Sit Sit to Stand: 2: Max assist;With upper extremity assist;From bed Stand to Sit: 2: Max assist;With upper extremity assist;To chair/3-in-1 Details for Transfer Assistance: Husband present and pt providing very minimal assist with bed mobility and transfer to [facility] and to recliner    Exercises      Balance     End of Session OT - End of Session Equipment Utilized During Treatment: Gait belt;Other (comment) (BSC) Activity Tolerance: Patient limited by fatigue Patient left: in chair;with call bell/phone within reach;with family/visitor present  GO     Galen Manila 08/13/2013, 12:27 PM

## 2013-08-14 LAB — CBC WITH DIFFERENTIAL/PLATELET
Basophils Absolute: 0 10*3/uL (ref 0.0–0.1)
Basophils Relative: 1 % (ref 0–1)
Hemoglobin: 12.7 g/dL (ref 12.0–15.0)
Lymphocytes Relative: 17 % (ref 12–46)
Lymphs Abs: 0.7 10*3/uL (ref 0.7–4.0)
MCH: 27.7 pg (ref 26.0–34.0)
MCHC: 32.8 g/dL (ref 30.0–36.0)
Monocytes Absolute: 0.3 10*3/uL (ref 0.1–1.0)
Neutro Abs: 3.1 10*3/uL (ref 1.7–7.7)
Neutrophils Relative %: 75 % (ref 43–77)
Platelets: 221 10*3/uL (ref 150–400)
RDW: 17 % — ABNORMAL HIGH (ref 11.5–15.5)
WBC: 4.1 10*3/uL (ref 4.0–10.5)

## 2013-08-14 LAB — COMPREHENSIVE METABOLIC PANEL
ALT: 8 U/L (ref 0–35)
Alkaline Phosphatase: 37 U/L — ABNORMAL LOW (ref 39–117)
BUN: 27 mg/dL — ABNORMAL HIGH (ref 6–23)
CO2: 22 mEq/L (ref 19–32)
Calcium: 8.6 mg/dL (ref 8.4–10.5)
Chloride: 105 mEq/L (ref 96–112)
GFR calc Af Amer: 43 mL/min — ABNORMAL LOW (ref >90)
GFR calc non Af Amer: 37 mL/min — ABNORMAL LOW (ref >90)
Glucose, Bld: 226 mg/dL — ABNORMAL HIGH (ref 70–99)
Potassium: 2.9 mEq/L — ABNORMAL LOW (ref 3.5–5.1)
Sodium: 142 mEq/L (ref 135–145)
Total Bilirubin: 0.5 mg/dL (ref 0.3–1.2)
Total Protein: 5.9 g/dL — ABNORMAL LOW (ref 6.0–8.3)

## 2013-08-14 LAB — GLUCOSE, CAPILLARY

## 2013-08-14 LAB — CSF CULTURE W GRAM STAIN: Culture: NO GROWTH

## 2013-08-14 MED ORDER — SPIRONOLACTONE 50 MG PO TABS
50.0000 mg | ORAL_TABLET | Freq: Every day | ORAL | Status: DC
  Administered 2013-08-14: 50 mg via ORAL
  Filled 2013-08-14 (×2): qty 1

## 2013-08-14 MED ORDER — POTASSIUM CHLORIDE IN NACL 20-0.9 MEQ/L-% IV SOLN
INTRAVENOUS | Status: DC
  Administered 2013-08-14 – 2013-08-16 (×4): via INTRAVENOUS
  Filled 2013-08-14 (×10): qty 1000

## 2013-08-14 NOTE — Progress Notes (Signed)
Clinical Social Work Department BRIEF PSYCHOSOCIAL ASSESSMENT 08/14/2013  Patient:  [name], [name]     Account Number:  [id number]     Admit date:  08/10/2013  Clinical Social Worker:  Carren Rang  Date/Time:  08/14/2013 11:13 AM  Referred by:  Care Management  Date Referred:  08/13/2013 Referred for  SNF Placement   Other Referral:   Interview type:  Family Other interview type:   CSW spoke to patient's husband at bedside.    PSYCHOSOCIAL DATA Living Status:  HUSBAND Admitted from facility:   Level of care:   Primary support name:  [name] Primary support relationship to patient:  SPOUSE Degree of support available:   Good    CURRENT CONCERNS Current Concerns  Post-Acute Placement   Other Concerns:    SOCIAL WORK ASSESSMENT / PLAN CSW received referral for SNF placement at dc. CSW went into room and explained reason for visit. Patient had visitor by bedside, husband. Husband stated that he does not want SNF placement at this time for his wife and he can take care of her at home with [facility] services. Patient's husband stated she has been to two different SNF before and has not liked the care SNF's provide. Patient's husband stated he will take patient home. CSW signing off at this time. Please re consult if CSW needs arise.   Assessment/plan status:  No Further Intervention Required Other assessment/ plan:   Information/referral to community resources:   SNF information/CSW information    PATIENT'S/FAMILY'S RESPONSE TO PLAN OF CARE: Patient's husband states he wants to take patient home at dc and at this time is refusing SNF placement.       Maree Krabbe, MSW, Theresia Majors [phone]

## 2013-08-14 NOTE — Progress Notes (Signed)
Paged MD on call to notify that K+ is 2.9 today. Pt is still having diarrhea, and cannot have another dose of Immodium until after lunch. Awaiting call back.

## 2013-08-14 NOTE — Progress Notes (Signed)
Subjective: Very little food intake, unclear if she continues to have significant diarrhea  Objective: Vital signs in last 24 hours: Temp:  [98.6 F (37 C)-99.4 F (37.4 C)] 98.6 F (37 C) (10/25 0554) Pulse Rate:  [74-87] 87 (10/25 0554) Resp:  [19-20] 19 (10/25 0554) BP: (148)/(54-76) 148/76 mmHg (10/25 0554) SpO2:  [99 %-100 %] 100 % (10/25 0554) Weight:  [60.6 kg (133 lb 9.6 oz)] 60.6 kg (133 lb 9.6 oz) (10/24 2131) Weight change: 1.5 kg (3 lb 4.9 oz)   Intake/Output from previous day: 10/24 0701 - 10/25 0700 In: 1805 [P.O.:120; I.V.:1685] Out: -    General appearance: alert, cooperative and no distress Resp: clear to auscultation bilaterally Cardio: regular rate and rhythm GI: soft, non-tender; bowel sounds normal; no masses,  no organomegaly Extremities: extremities normal, atraumatic, no cyanosis or edema  Lab Results:  Recent Labs  08/13/13 0932 08/14/13 0532  WBC 5.5 4.1  HGB 11.5* 12.7  HCT 35.7* 38.7  PLT 200 221   BMET  Recent Labs  08/13/13 0932 08/14/13 0532  NA 141 142  K 5.0 2.9*  CL 103 105  CO2 24 22  GLUCOSE 239* 226*  BUN 30* 27*  CREATININE 1.48* 1.36*  CALCIUM 9.1 8.6   CMET CMP     Component Value Date/Time   NA 142 08/14/2013 0532   K 2.9* 08/14/2013 0532   CL 105 08/14/2013 0532   CO2 22 08/14/2013 0532   GLUCOSE 226* 08/14/2013 0532   BUN 27* 08/14/2013 0532   CREATININE 1.36* 08/14/2013 0532   CALCIUM 8.6 08/14/2013 0532   CALCIUM 9.9 01/26/2013 0949   PROT 5.9* 08/14/2013 0532   ALBUMIN 2.8* 08/14/2013 0532   AST 15 08/14/2013 0532   ALT 8 08/14/2013 0532   ALKPHOS 37* 08/14/2013 0532   BILITOT 0.5 08/14/2013 0532   GFRNONAA 37* 08/14/2013 0532   GFRAA 43* 08/14/2013 0532    CBG (last 3)   Recent Labs  08/13/13 2129 08/14/13 0747 08/14/13 1142  GLUCAP 128* 261* 258*    INR RESULTS:   Lab Results  Component Value Date   INR 1.0 02/19/2009   INR 0.9 04/02/2007     Studies/Results: Dg Abd 2  Views  08/13/2013   CLINICAL DATA:  72 year old female with abdominal pain. Initial encounter.  EXAM: ABDOMEN - 2 VIEW  COMPARISON:  CT Abdomen and Pelvis 07/19/2010.  FINDINGS: Rim calcified right upper pole renal lesion is visible radiographically. Aortoiliac calcified atherosclerosis noted. Non obstructed bowel gas pattern. Advanced lower lumbar disc degeneration with vacuum disc phenomena. No acute osseous abnormality identified. No pneumoperitoneum on left-side-down lateral decubitus view.  IMPRESSION: 1. Non obstructed bowel gas pattern, no free air.  2. Chronic rim calcified right upper pole renal lesion. Advanced aortoiliac calcified atherosclerosis.   Electronically Signed   By: Augusto Gamble M.D.   On: 08/13/2013 09:07    Medications: I have reviewed the patient's current medications.  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 Will check results of CSF cultures. #2 Hypokalemia: moderately severe and have changed IVF to contain KCL and added aldactone to regimen, and will recheck labs in the am.  #3 Protein Calorie Malnutrition: severe and not clinically improving. May need to consider hospice evaluation as an outpatient.   LOS: 4 days   Ritchard Paragas G 08/14/2013, 1:35 PM

## 2013-08-14 NOTE — Progress Notes (Signed)
Received callback from MD on call regarding low potassium. MD aware. Will be by to see pt shortly.

## 2013-08-15 LAB — COMPREHENSIVE METABOLIC PANEL
ALT: 7 U/L (ref 0–35)
AST: 13 U/L (ref 0–37)
Alkaline Phosphatase: 34 U/L — ABNORMAL LOW (ref 39–117)
CO2: 21 mEq/L (ref 19–32)
Calcium: 8.5 mg/dL (ref 8.4–10.5)
GFR calc Af Amer: 45 mL/min — ABNORMAL LOW (ref >90)
GFR calc non Af Amer: 39 mL/min — ABNORMAL LOW (ref >90)
Glucose, Bld: 199 mg/dL — ABNORMAL HIGH (ref 70–99)
Potassium: 3 mEq/L — ABNORMAL LOW (ref 3.5–5.1)
Sodium: 143 mEq/L (ref 135–145)
Total Protein: 5.6 g/dL — ABNORMAL LOW (ref 6.0–8.3)

## 2013-08-15 LAB — CBC WITH DIFFERENTIAL/PLATELET
Basophils Absolute: 0 10*3/uL (ref 0.0–0.1)
Basophils Relative: 0 % (ref 0–1)
Eosinophils Absolute: 0 10*3/uL (ref 0.0–0.7)
Eosinophils Relative: 1 % (ref 0–5)
HCT: 35.8 % — ABNORMAL LOW (ref 36.0–46.0)
Hemoglobin: 11.6 g/dL — ABNORMAL LOW (ref 12.0–15.0)
Lymphocytes Relative: 17 % (ref 12–46)
MCH: 27.2 pg (ref 26.0–34.0)
MCHC: 32.4 g/dL (ref 30.0–36.0)
Monocytes Absolute: 0.3 10*3/uL (ref 0.1–1.0)
Monocytes Relative: 9 % (ref 3–12)
Neutro Abs: 2.8 10*3/uL (ref 1.7–7.7)
RDW: 16.9 % — ABNORMAL HIGH (ref 11.5–15.5)
WBC: 3.8 10*3/uL — ABNORMAL LOW (ref 4.0–10.5)

## 2013-08-15 LAB — GLUCOSE, CAPILLARY
Glucose-Capillary: 244 mg/dL — ABNORMAL HIGH (ref 70–99)
Glucose-Capillary: 166 mg/dL — ABNORMAL HIGH (ref 70–99)

## 2013-08-15 MED ORDER — SACCHAROMYCES BOULARDII 250 MG PO CAPS
250.0000 mg | ORAL_CAPSULE | Freq: Two times a day (BID) | ORAL | Status: DC
  Administered 2013-08-15 – 2013-08-17 (×5): 250 mg via ORAL
  Filled 2013-08-15 (×6): qty 1

## 2013-08-15 MED ORDER — PANCRELIPASE (LIP-PROT-AMYL) 12000-38000 UNITS PO CPEP
1.0000 | ORAL_CAPSULE | Freq: Three times a day (TID) | ORAL | Status: DC
  Administered 2013-08-15 – 2013-08-17 (×6): 1 via ORAL
  Filled 2013-08-15 (×9): qty 1

## 2013-08-15 MED ORDER — SPIRONOLACTONE 100 MG PO TABS
100.0000 mg | ORAL_TABLET | Freq: Every day | ORAL | Status: DC
  Administered 2013-08-15 – 2013-08-17 (×3): 100 mg via ORAL
  Filled 2013-08-15 (×3): qty 1

## 2013-08-15 NOTE — Progress Notes (Signed)
Subjective: Having diarrhea today, wants to go home  Objective: Vital signs in last 24 hours: Temp:  [98.3 F (36.8 C)-98.4 F (36.9 C)] 98.4 F (36.9 C) (10/26 0607) Pulse Rate:  [77-92] 92 (10/26 0607) Resp:  [18-20] 20 (10/26 0607) BP: (118-157)/(70-83) 119/70 mmHg (10/26 0607) SpO2:  [97 %-100 %] 100 % (10/26 0607) Weight change:    Intake/Output from previous day: 10/25 0701 - 10/26 0700 In: 2219.6 [P.O.:60; I.V.:2159.6] Out: 200 [Urine:200]   General appearance: alert, cooperative and mild distress Resp: clear to auscultation bilaterally Cardio: regular rate and rhythm GI: soft, non-tender; bowel sounds normal; no masses,  no organomegaly Extremities: extremities normal, atraumatic, no cyanosis or edema  Lab Results:  Recent Labs  08/14/13 0532 08/15/13 0715  WBC 4.1 3.8*  HGB 12.7 11.6*  HCT 38.7 35.8*  PLT 221 228   BMET  Recent Labs  08/14/13 0532 08/15/13 0715  NA 142 143  K 2.9* 3.0*  CL 105 109  CO2 22 21  GLUCOSE 226* 199*  BUN 27* 25*  CREATININE 1.36* 1.32*  CALCIUM 8.6 8.5   CMET CMP     Component Value Date/Time   NA 143 08/15/2013 0715   K 3.0* 08/15/2013 0715   CL 109 08/15/2013 0715   CO2 21 08/15/2013 0715   GLUCOSE 199* 08/15/2013 0715   BUN 25* 08/15/2013 0715   CREATININE 1.32* 08/15/2013 0715   CALCIUM 8.5 08/15/2013 0715   CALCIUM 9.9 01/26/2013 0949   PROT 5.6* 08/15/2013 0715   ALBUMIN 2.9* 08/15/2013 0715   AST 13 08/15/2013 0715   ALT 7 08/15/2013 0715   ALKPHOS 34* 08/15/2013 0715   BILITOT 0.5 08/15/2013 0715   GFRNONAA 39* 08/15/2013 0715   GFRAA 45* 08/15/2013 0715    CBG (last 3)   Recent Labs  08/14/13 1621 08/14/13 2051 08/15/13 0744  GLUCAP 170* 140* 202*    INR RESULTS:   Lab Results  Component Value Date   INR 1.0 02/19/2009   INR 0.9 04/02/2007     Studies/Results: No results found.  Medications: I have reviewed the patient's current medications.  Assessment/Plan: #1 Hypokalemia:  slight improvement with IVF and aldactone. Will increase rate of IVF and increase dose of aldactone, monitor electrolytes closely #2 Diarrhea: unclear cause, and recent c. Diff toxin test and enterovirus tests were normal. Will try adding FloraStor and creon.    LOS: 5 days   Martine Trageser G 08/15/2013, 10:03 AM

## 2013-08-15 NOTE — Progress Notes (Signed)
Patient's husband refused to have bed alarm be turned on.Advised husband to call for any assistance. Satori Krabill Joselita,RN

## 2013-08-16 LAB — CBC WITH DIFFERENTIAL/PLATELET
Basophils Absolute: 0 10*3/uL (ref 0.0–0.1)
Eosinophils Relative: 1 % (ref 0–5)
Lymphocytes Relative: 25 % (ref 12–46)
Monocytes Relative: 6 % (ref 3–12)
Neutrophils Relative %: 68 % (ref 43–77)
Platelets: 244 10*3/uL (ref 150–400)
RBC: 3.89 MIL/uL (ref 3.87–5.11)
RDW: 16.9 % — ABNORMAL HIGH (ref 11.5–15.5)
WBC: 3.2 10*3/uL — ABNORMAL LOW (ref 4.0–10.5)

## 2013-08-16 LAB — COMPREHENSIVE METABOLIC PANEL
Alkaline Phosphatase: 30 U/L — ABNORMAL LOW (ref 39–117)
BUN: 23 mg/dL (ref 6–23)
Chloride: 111 mEq/L (ref 96–112)
Creatinine, Ser: 1.3 mg/dL — ABNORMAL HIGH (ref 0.50–1.10)
GFR calc Af Amer: 46 mL/min — ABNORMAL LOW (ref >90)
GFR calc non Af Amer: 39 mL/min — ABNORMAL LOW (ref >90)
Glucose, Bld: 96 mg/dL (ref 70–99)
Potassium: 3.2 mEq/L — ABNORMAL LOW (ref 3.5–5.1)
Total Bilirubin: 0.5 mg/dL (ref 0.3–1.2)
Total Protein: 5.3 g/dL — ABNORMAL LOW (ref 6.0–8.3)

## 2013-08-16 LAB — ENTEROVIRUS PCR: Enterovirus PCR: NOT DETECTED

## 2013-08-16 LAB — GLUCOSE, CAPILLARY: Glucose-Capillary: 208 mg/dL — ABNORMAL HIGH (ref 70–99)

## 2013-08-16 MED ORDER — METRONIDAZOLE 500 MG PO TABS
500.0000 mg | ORAL_TABLET | Freq: Three times a day (TID) | ORAL | Status: DC
  Administered 2013-08-16 – 2013-08-17 (×4): 500 mg via ORAL
  Filled 2013-08-16 (×7): qty 1

## 2013-08-16 NOTE — Progress Notes (Signed)
Subjective: 3 loose BM''s already this AM. Some abd pain prior to BM. Not eating well   Objective: Vital signs in last 24 hours: Temp:  [98.3 F (36.8 C)-98.9 F (37.2 C)] 98.7 F (37.1 C) (10/27 0640) Pulse Rate:  [76-88] 76 (10/27 0640) Resp:  [16-18] 18 (10/27 0640) BP: (120-156)/(60-68) 156/60 mmHg (10/27 0640) SpO2:  [98 %-100 %] 98 % (10/27 0640)  Intake/Output from previous day: 10/26 0701 - 10/27 0700 In: 1555 [P.O.:400; I.V.:1155] Out: -  Intake/Output this shift:    Non toxic. Some periorbital edema on left. Mentation improved  Lab Results   Recent Labs  08/15/13 0715 08/16/13 0508  WBC 3.8* 3.2*  RBC 4.27 3.89  HGB 11.6* 10.8*  HCT 35.8* 32.5*  MCV 83.8 83.5  MCH 27.2 27.8  RDW 16.9* 16.9*  PLT 228 244    Recent Labs  08/15/13 0715 08/16/13 0508  NA 143 142  K 3.0* 3.2*  CL 109 111  CO2 21 20  GLUCOSE 199* 96  BUN 25* 23  CREATININE 1.32* 1.30*  CALCIUM 8.5 8.4    Studies/Results: No results found.  Scheduled Meds: . allopurinol  150 mg Oral Daily  . amLODipine  10 mg Oral Daily  . calcitRIOL  0.5 mcg Oral Q breakfast  . enoxaparin (LOVENOX) injection  30 mg Subcutaneous Daily  . feeding supplement (RESOURCE BREEZE)  1 Container Oral TID BM  . insulin aspart  0-9 Units Subcutaneous TID WC  . insulin detemir  18 Units Subcutaneous Daily  . latanoprost  1 drop Both Eyes QHS  . levothyroxine  224 mcg Oral QAC breakfast  . lipase/protease/amylase  1 capsule Oral TID AC  . mycophenolate  500 mg Oral BID  . predniSONE  10 mg Oral Q breakfast  . saccharomyces boulardii  250 mg Oral BID  . sirolimus  3 mg Oral Q2000  . spironolactone  100 mg Oral Daily   Continuous Infusions: . 0.9 % NaCl with KCl 20 mEq / L 100 mL/hr at 08/16/13 0012   PRN Meds:acetaminophen, acetaminophen, loperamide, ondansetron (ZOFRAN) IV, ondansetron  Assessment/Plan: Diarrhea: empiric Flagyl. Send C Diff  Metabolic encephalopathy- Mentation is close to her  baseline. cxs all fine off antiviral. Doubt an acute viral process Chronic pain. Narcotics held for now- doing OK from a pain perspective  2. Acute Renal Failure/History of renal transplant/ Immunosuppressed status- Cr better at 1.32 3. Diabetes mellitus type 2, uncontrolled- improved, no lows 4. Weakness- continue PT/OT. Husband prefers to take her home but doubt this is feasible  5. Hypertension fair with some spikes  6. Pancytopenia- may be secondary to viral illness vs. Immunosuppressants. Probable dilutional component as well. Monitor.  7. Disposition- would prefer SNF    LOS: 6 days   [name] [name] 08/16/2013, 8:18 AM

## 2013-08-16 NOTE — Progress Notes (Signed)
Occupational Therapy Treatment Patient Details Name: [name] MRN: [id number] DOB: 06-Jun-1941 Today's Date: 08/16/2013 Time: 7829-5621 OT Time Calculation (min): 32 min  OT Assessment / Plan / Recommendation  History of present illness This 72 y.o.female admitted 08/10/13 with altered mental status since yesterday evening. She was unresponsive to verbal command and confused. This was associated with one episode of vomiting but no fever, neck stiffness or headache. Her husband states that she was able to respond to some of his questions earlier this morning but continues to be confused. At the hospital, she has been put on meropenem, clindamycin, acyclovir and prednisone. A LP was done yesterday (08/10/13). The CSF culture showed some WBC, predominantly PMNs but no organisms were seen.  Her past medical history is significant for a renal transplant in 2008 for which she is on long term prednisone use.  Viral Encephalitis/Metabolic encephalopathy-36 hours of declining mental status associated with low grade fever in immunosuppressed patient- CSF suggestive of possible viral meningitis/encephalitis.   OT comments  Pt contiunes to requires extensive assist with mobility and ADLs. Pt required 2 person assist for transfer to [facility] and to bed. Pt demos overall generalized weakness and activity tolerance. Pt demos difficulty following commands for activity tolerance/UE tasks and required physical assis to complete. Pt's husband still insists on taking her home and caring for her after acute care d/c  Follow Up Recommendations  SNF;Supervision/Assistance - 24 hour    Barriers to Discharge   Uncertain if pt's husband can continue to provide safe and adequate care given pt's current level of care    Equipment Recommendations   none    Recommendations for Other Services    Frequency Min 2X/week   Progress towards OT Goals Progress towards OT goals: Not progressing toward goals - comment (pt more alert  today, assisting less)  Plan Discharge plan needs to be updated    Precautions / Restrictions Precautions Precautions: Fall Restrictions Weight Bearing Restrictions: No   Pertinent Vitals/Pain No c/o pain    ADL  Toilet Transfer: Performed;+2 Total assistance Toilet Transfer: Patient Percentage: 20% Toilet Transfer Method: Stand pivot;Sit to Barista: Materials engineer and Hygiene: +1 Total assistance Where Assessed - Toileting Clothing Manipulation and Hygiene: Standing Transfers/Ambulation Related to ADLs: mod A for sit - stand form chair and BSC, total A + 2 for transfer/stan - sit to [facility] and bed    OT Diagnosis:    OT Problem List:   OT Treatment Interventions:     OT Goals(current goals can now be found in the care plan section)    Visit Information  Last OT Received On: 08/16/13 Assistance Needed: +2 PT/OT Co-Evaluation/Treatment: Yes History of Present Illness: This 72 y.o.female admitted 08/10/13 with altered mental status since yesterday evening. She was unresponsive to verbal command and confused. This was associated with one episode of vomiting but no fever, neck stiffness or headache. Her husband states that she was able to respond to some of his questions earlier this morning but continues to be confused. At the hospital, she has been put on meropenem, clindamycin, acyclovir and prednisone. A LP was done yesterday (08/10/13). The CSF culture showed some WBC, predominantly PMNs but no organisms were seen.  Her past medical history is significant for a renal transplant in 2008 for which she is on long term prednisone use.  Viral Encephalitis/Metabolic encephalopathy-36 hours of declining mental status associated with low grade fever in immunosuppressed patient- CSF suggestive of possible  viral meningitis/encephalitis.    Subjective Data      Prior Functioning       Cognition  Cognition Arousal/Alertness:  Awake/alert Behavior During Therapy: Flat affect Area of Impairment: Attention;Following commands Current Attention Level: Sustained Following Commands: Follows one step commands consistently    Mobility  Bed Mobility Bed Mobility: Sit to Supine;Scooting to HOB Sit to Supine: 1: +2 Total assist Sit to Supine: Patient Percentage: 0% Scooting to HOB: 1: +2 Total assist Scooting to [facility]: Patient Percentage: 0% Details for Bed Mobility Assistance: Pt assisting 0% Transfers Transfers: Sit to Stand;Stand to Sit Sit to Stand: 3: Mod assist;From chair/3-in-1;From toilet Stand to Sit: 1: +2 Total assist;To chair/3-in-1;To bed;To toilet Stand to Sit: Patient Percentage: 20%    Exercises      Balance     End of Session OT - End of Session Equipment Utilized During Treatment: Gait belt;Other (comment) (BSC) Activity Tolerance: Patient limited by fatigue Patient left: with call bell/phone within reach;with family/visitor present;in bed  GO     Galen Manila 08/16/2013, 2:40 PM

## 2013-08-16 NOTE — Progress Notes (Signed)
Physical Therapy Treatment Patient Details Name: CNIYAH SPROULL MRN: [id number] DOB: 09/22/1941 Today's Date: 08/16/2013 Time: 6578-4696 PT Time Calculation (min): 32 min  PT Assessment / Plan / Recommendation  History of Present Illness This 72 y.o.female admitted 08/10/13 with altered mental status since yesterday evening. She was unresponsive to verbal command and confused. This was associated with one episode of vomiting but no fever, neck stiffness or headache. Her husband states that she was able to respond to some of his questions earlier this morning but continues to be confused. At the hospital, she has been put on meropenem, clindamycin, acyclovir and prednisone. A LP was done yesterday (08/10/13). The CSF culture showed some WBC, predominantly PMNs but no organisms were seen.  Her past medical history is significant for a renal transplant in 2008 for which she is on long term prednisone use.  Viral Encephalitis/Metabolic encephalopathy-36 hours of declining mental status associated with low grade fever in immunosuppressed patient- CSF suggestive of possible viral meningitis/encephalitis.   PT Comments   Pt continues to be 2+ extensive (A) for transfers. Uncertain if pt's hsuband can continue to provide amount of (A) needed for safe transfers at home. Pt would benefit greatly from SNF but pt and husband are continuing to refuse at this time. Pt has difficulty following commands. Pt demo decreased activity tolerance and fearful of falling.   Follow Up Recommendations  Home health PT;Supervision/Assistance - 24 hour (husband refusing SNF )     Does the patient have the potential to tolerate intense rehabilitation     Barriers to Discharge  uncertain pt's husband can provide adequate amount of (A) needed for pt to be safe upon acute D/C home.       Equipment Recommendations  None recommended by PT    Recommendations for Other Services    Frequency Min 3X/week   Progress towards PT  Goals Progress towards PT goals: Progressing toward goals  Plan Current plan remains appropriate    Precautions / Restrictions Precautions Precautions: Fall Restrictions Weight Bearing Restrictions: No   Pertinent Vitals/Pain No c/o pain     Mobility  Bed Mobility Bed Mobility: Sit to Supine;Scooting to HOB Sit to Supine: 1: +2 Total assist Sit to Supine: Patient Percentage: 0% Scooting to HOB: 1: +2 Total assist Scooting to  [facility]: Patient Percentage: 0% Details for Bed Mobility Assistance: Pt assisting 0%; 2 person (A) and relies heavily on draw pad to scoot pt up in bed Transfers Transfers: Sit to Stand;Stand to Sit;Stand Pivot Transfers Sit to Stand: 3: Mod assist;From chair/3-in-1;From toilet Stand to Sit: 1: +2 Total assist;To chair/3-in-1;To bed;To toilet Stand to Sit: Patient Percentage: 20% Stand Pivot Transfers: 1: +2 Total assist;From elevated surface;With armrests Stand Pivot Transfers: Patient Percentage: 20% Details for Transfer Assistance: pt was able to (A) in sit to stand by WB through LEs; pt demo fear with transfers and requierd 2+ for safety; ;pt unable to take pivotal steps to [facility] or bed; husband present and reported he transfers pt like that daily at home; pt is a fall risk Ambulation/Gait Ambulation/Gait Assistance: Not tested (comment) Stairs: No Wheelchair Mobility Wheelchair Mobility: No    Exercises General Exercises - Lower Extremity Ankle Circles/Pumps: PROM;Both;10 reps;Other (comment) (prevent DVT and contractures ) Long Arc Quad: AAROM;Both;5 reps;Other (comment) (pt followed cues inconsistently) Heel Slides: PROM;5 reps;Both;Seated   PT Diagnosis:    PT Problem List:   PT Treatment Interventions:     PT Goals (current goals can now be found in the care  plan section) Acute Rehab PT Goals Patient Stated Goal: I just want to go home PT Goal Formulation: With patient/family Time For Goal Achievement: 08/19/13 Potential to Achieve Goals:  Fair  Visit Information  Last PT Received On: 08/16/13 Assistance Needed: +2 PT/OT Co-Evaluation/Treatment: Yes History of Present Illness: This 72 y.o.female admitted 08/10/13 with altered mental status since yesterday evening. She was unresponsive to verbal command and confused. This was associated with one episode of vomiting but no fever, neck stiffness or headache. Her husband states that she was able to respond to some of his questions earlier this morning but continues to be confused. At the hospital, she has been put on meropenem, clindamycin, acyclovir and prednisone. A LP was done yesterday (08/10/13). The CSF culture showed some WBC, predominantly PMNs but no organisms were seen.  Her past medical history is significant for a renal transplant in 2008 for which she is on long term prednisone use.  Viral Encephalitis/Metabolic encephalopathy-36 hours of declining mental status associated with low grade fever in immunosuppressed patient- CSF suggestive of possible viral meningitis/encephalitis.    Subjective Data  Subjective: pt sitting in chair with husband present; husband reports pt had a rough morning and had diarrhea. husband reports that the diarrhea is "pretty much controled now"  Patient Stated Goal: I just want to go home   Cognition  Cognition Arousal/Alertness: Awake/alert Behavior During Therapy: Flat affect Overall Cognitive Status: Impaired/Different from baseline Area of Impairment: Attention;Following commands Current Attention Level: Sustained Following Commands: Follows one step commands inconsistently;Follows one step commands with increased time    Balance  Balance Balance Assessed: No  End of Session PT - End of Session Equipment Utilized During Treatment: Gait belt Activity Tolerance: Patient limited by fatigue Patient left: in bed;with call bell/phone within reach;with family/visitor present Nurse Communication: Mobility status   GP     Donell Sievert, [place] [phone] 08/16/2013, 3:12 PM

## 2013-08-17 DIAGNOSIS — E039 Hypothyroidism, unspecified: Secondary | ICD-10-CM | POA: Diagnosis present

## 2013-08-17 DIAGNOSIS — R197 Diarrhea, unspecified: Secondary | ICD-10-CM | POA: Diagnosis not present

## 2013-08-17 DIAGNOSIS — D61818 Other pancytopenia: Secondary | ICD-10-CM | POA: Diagnosis present

## 2013-08-17 DIAGNOSIS — E876 Hypokalemia: Secondary | ICD-10-CM | POA: Diagnosis present

## 2013-08-17 DIAGNOSIS — N189 Chronic kidney disease, unspecified: Secondary | ICD-10-CM | POA: Diagnosis present

## 2013-08-17 DIAGNOSIS — F0391 Unspecified dementia with behavioral disturbance: Secondary | ICD-10-CM | POA: Diagnosis present

## 2013-08-17 LAB — COMPREHENSIVE METABOLIC PANEL
ALT: 8 U/L (ref 0–35)
AST: 14 U/L (ref 0–37)
Albumin: 2.9 g/dL — ABNORMAL LOW (ref 3.5–5.2)
BUN: 24 mg/dL — ABNORMAL HIGH (ref 6–23)
CO2: 20 mEq/L (ref 19–32)
Chloride: 111 mEq/L (ref 96–112)
GFR calc non Af Amer: 39 mL/min — ABNORMAL LOW (ref >90)
Potassium: 3.3 mEq/L — ABNORMAL LOW (ref 3.5–5.1)
Sodium: 143 mEq/L (ref 135–145)
Total Bilirubin: 0.4 mg/dL (ref 0.3–1.2)

## 2013-08-17 LAB — CBC WITH DIFFERENTIAL/PLATELET
Basophils Relative: 0 % (ref 0–1)
Eosinophils Absolute: 0 10*3/uL (ref 0.0–0.7)
Eosinophils Relative: 1 % (ref 0–5)
HCT: 33.4 % — ABNORMAL LOW (ref 36.0–46.0)
Lymphs Abs: 0.4 10*3/uL — ABNORMAL LOW (ref 0.7–4.0)
MCH: 27.8 pg (ref 26.0–34.0)
MCHC: 33.5 g/dL (ref 30.0–36.0)
MCV: 82.9 fL (ref 78.0–100.0)
Monocytes Absolute: 0.8 10*3/uL (ref 0.1–1.0)
Monocytes Relative: 20 % — ABNORMAL HIGH (ref 3–12)
Neutro Abs: 2.6 10*3/uL (ref 1.7–7.7)
Neutrophils Relative %: 68 % (ref 43–77)
Platelets: 266 10*3/uL (ref 150–400)
RBC: 4.03 MIL/uL (ref 3.87–5.11)
WBC: 3.8 10*3/uL — ABNORMAL LOW (ref 4.0–10.5)

## 2013-08-17 LAB — GLUCOSE, CAPILLARY
Glucose-Capillary: 127 mg/dL — ABNORMAL HIGH (ref 70–99)
Glucose-Capillary: 116 mg/dL — ABNORMAL HIGH (ref 70–99)

## 2013-08-17 MED ORDER — METRONIDAZOLE 500 MG PO TABS: 500.0000 mg | ORAL_TABLET | Freq: Three times a day (TID) | ORAL | Status: DC

## 2013-08-17 NOTE — Discharge Summary (Signed)
DISCHARGE SUMMARY  [name]  MR#: [id number]  DOB:16-Aug-1941  Date of Admission: 08/10/2013 Date of Discharge: 08/17/2013  Attending Physician:[name]  Patient's WUJ:[id number],[id number] Erika Diener, MD  Consults:  infectious disease Procedures: Lumbar puncture  Discharge Diagnoses: Active Problems:   Hypertension, improved   Metabolic encephalopathy, improved and back to baseline   Diabetes mellitus type 2, uncontrolled   Weakness, persistent   History of renal transplant, with stable renal parameters   Encephalitis, treated empirically with no positive cultures   Immunosuppressed status   Protein-calorie malnutrition, severe   Other pancytopenia to have outpatient followup   Hypokalemia to have replacement   Diarrhea, C. difficile negative but with impaired treatment for 5 more days with metronidazole   Dementia to have outpatient evaluation   Acute renal insufficiency   Unspecified hypothyroidism clinically stable Gout, on prophylaxis Diabetic neuropathy, stable Chronic back pain, better now on fewer medications  glaucoma , on treatment GERD on treatment   Discharge Medications:   Medication List    STOP taking these medications       fentaNYL 50 MCG/HR  Commonly known as:  DURAGESIC - dosed mcg/hr      TAKE these medications       allopurinol 300 MG tablet  Commonly known as:  ZYLOPRIM  Take 150 mg by mouth daily.     amLODipine 10 MG tablet  Commonly known as:  NORVASC  Take 1 tablet (10 mg total) by mouth daily with breakfast.     aspirin-acetaminophen-caffeine 250-250-65 MG per tablet  Commonly known as:  EXCEDRIN MIGRAINE  Take 2 tablets by mouth every 6 (six) hours as needed for pain.     calcitRIOL 0.5 MCG capsule  Commonly known as:  ROCALTROL  Take 1 capsule (0.5 mcg total) by mouth daily with breakfast.     furosemide 40 MG tablet  Commonly known as:  LASIX  Take 80 mg by mouth 2 (two) times daily.     gabapentin 300 MG capsule   Commonly known as:  NEURONTIN  Take 300 mg by mouth 2 (two) times daily.     insulin detemir 100 UNIT/ML injection  Commonly known as:  LEVEMIR  Inject 16 Units into the skin daily.     insulin lispro 100 UNIT/ML injection  Commonly known as:  HUMALOG  Inject 2-4 Units into the skin 3 (three) times daily before meals. Per sliding scale     labetalol 200 MG tablet  Commonly known as:  NORMODYNE  Take 1.5 tablets (300 mg total) by mouth 3 (three) times daily. Takes 1 and 1/2 tabs     latanoprost 0.005 % ophthalmic solution  Commonly known as:  XALATAN  Place 1 drop into both eyes at bedtime.     levothyroxine 112 MCG tablet  Commonly known as:  SYNTHROID, LEVOTHROID  Take 2 tablets (224 mcg total) by mouth daily with breakfast.     metroNIDAZOLE 500 MG tablet  Commonly known as:  FLAGYL  Take 1 tablet (500 mg total) by mouth every 8 (eight) hours.     mycophenolate 250 MG capsule  Commonly known as:  CELLCEPT  Take 2 capsules (500 mg total) by mouth 2 (two) times daily.     potassium chloride SA 20 MEQ tablet  Commonly known as:  K-DUR,KLOR-CON  Take 1 tablet (20 mEq total) by mouth daily.     predniSONE 5 MG tablet  Commonly known as:  DELTASONE  Take 2 tablets (10 mg total) by mouth daily  with breakfast.     ranitidine 150 MG tablet  Commonly known as:  ZANTAC  Take 1 tablet (150 mg total) by mouth daily.     sirolimus 1 MG tablet  Commonly known as:  RAPAMUNE  Take 3 tablets (3 mg total) by mouth daily. Pt takes three 1 mg tablets = 3 mg daily        Hospital Procedures: Dg Chest 2 View  08/10/2013   CLINICAL DATA:  Weakness.  EXAM: CHEST  2 VIEW  COMPARISON:  07/01/2013  FINDINGS: Two views of the chest demonstrate low lung volumes. Slightly prominent right central vascular structures are unchanged. No focal airspace disease. Heart size is stable. Patient is rotated towards the left.  IMPRESSION: Low lung volumes without focal disease.   Electronically Signed    By: Richarda Overlie M.D.   On: 08/10/2013 19:05   Ct Head Wo Contrast  08/10/2013   CLINICAL DATA:  Altered mental status, weakness  EXAM: CT HEAD WITHOUT CONTRAST  TECHNIQUE: Contiguous axial images were obtained from the base of the skull through the vertex without intravenous contrast.  COMPARISON:  Head CT 06/27/2013, 05/21/2013  FINDINGS: No acute intracranial hemorrhage. No focal mass lesion. No CT evidence of acute infarction. No midline shift or mass effect. No hydrocephalus. Basilar cisterns are patent. Mild atrophy and microvascular disease unchanged.  Paranasal sinuses and mastoid air cells are clear.  IMPRESSION: 1. No acute intracranial findings.  2. Unchanged from multiple head CT comparisons.   Electronically Signed   By: Genevive Bi M.D.   On: 08/10/2013 19:16   Dg Abd 2 Views  08/13/2013   CLINICAL DATA:  72 year old female with abdominal pain. Initial encounter.  EXAM: ABDOMEN - 2 VIEW  COMPARISON:  CT Abdomen and Pelvis 07/19/2010.  FINDINGS: Rim calcified right upper pole renal lesion is visible radiographically. Aortoiliac calcified atherosclerosis noted. Non obstructed bowel gas pattern. Advanced lower lumbar disc degeneration with vacuum disc phenomena. No acute osseous abnormality identified. No pneumoperitoneum on left-side-down lateral decubitus view.  IMPRESSION: 1. Non obstructed bowel gas pattern, no free air.  2. Chronic rim calcified right upper pole renal lesion. Advanced aortoiliac calcified atherosclerosis.   Electronically Signed   By: Augusto Gamble M.D.   On: 08/13/2013 09:07   Dg Lumbar Puncture Fluoro Guide  08/10/2013   CLINICAL DATA:  Altered mental status  EXAM: DIAGNOSTIC LUMBAR PUNCTURE UNDER FLUOROSCOPIC GUIDANCE  FLUOROSCOPY TIME:  Zero Min and 36 seconds  PROCEDURE: Informed consent was obtained from the patient prior to the procedure, including potential complications of headache, allergy, and pain. With the patient prone, the lower back was prepped with  Betadine. 1% Lidocaine was used for local anesthesia. Lumbar puncture was performed at the L3-4 level using a gauge needle with return of clear CSF with an opening pressure of 14 cm water. 4ml of CSF were obtained for laboratory studies. The patient tolerated the procedure well and there were no apparent complications. The patient was uncooperative and began moving during collection of fluid. The needle was dislodged from the subarachnoid space and subsequently removed.  IMPRESSION: Lumbar puncture with clear CSF.   Electronically Signed   By: Marlan Palau M.D.   On: 08/10/2013 23:46    History of Present Illness: Weakness and confusion  Hospital Course: This is [age] black female well-known to our practice. She recently been under my care at the nursing home had been home roughly 5 days. She presented again with similar problems before with  altered mental status and weakness. This time a lumbar puncture was performed showing a small number of white blood cells present in the CSF. She was empirically treated with several days of antiviral therapy that was discontinued when the cultures came back negative. The patient's neurologic status actually has done quite a bit better. She does have underlying dementia that is undergoing workup at this time. Pain medications have been minimized and this seems to help. Her blood sugars are done fair while here. She's not had a lows. Oral intake is fair at best. She still weak and unstable on her feet. She did have some diarrhea that is C. difficile negative. A release for up suggestion of an over night and none today. She will complete an empiric course of metronidazole. Her respiratory status is stable. She's had no cardiac issues while here. She's not been very active but this is her baseline. I would prefer that she go to skilled nursing facility but the patient and her husband wish to take her home. Her renal status has stabilized her baseline. She does have a new  issue of pancytopenia. We do need to get her to her nephrologist to see if her medications need to be adjusted. We will have to watch carefully to see if she can do well without her pain patch.  Day of Discharge Exam BP 162/73  Pulse 74  Temp(Src) 98.7 F (37.1 C) (Oral)  Resp 16  Ht 5\' 3"  (1.6 m)  Wt 63.594 kg (140 lb 3.2 oz)  BMI 24.84 kg/m2  SpO2 99%  Physical Exam: General appearance: Chronically ill-appearing black female in no distress. Some periorbital edema on the left is present. Sclera anicteric. Face is symmetric. Eyes: no scleral icterus Throat: oropharynx moist without erythema Resp: Clear with no wheezes rales or rhonchi. No accessory muscles are in use. Cardio: Regular with a systolic murmur. GI: soft, non-tender; bowel sounds normal; no masses,  no organomegaly, multiple well-healed scars. Extremities: Some edema of the right arm is present. AV shunt is in the left arm. Distal pulses are fair. Fungal nail changes are present. Pulses are relatively well-preserved her feet. Neuro: The patient is awake. Her mental processing is fair. She recognizes me easily. Speech is clear and fluent albeit a bit delayed. she has a slight tremor at rest.  Discharge Labs:  Recent Labs  08/16/13 0508 08/17/13 0413  NA 142 143  K 3.2* 3.3*  CL 111 111  CO2 20 20  GLUCOSE 96 106*  BUN 23 24*  CREATININE 1.30* 1.31*  CALCIUM 8.4 8.6    Recent Labs  08/16/13 0508 08/17/13 0413  AST 11 14  ALT 6 8  ALKPHOS 30* 32*  BILITOT 0.5 0.4  PROT 5.3* 5.6*  ALBUMIN 2.7* 2.9*    Recent Labs  08/16/13 0508 08/17/13 0413  WBC 3.2* 3.8*  NEUTROABS 2.2 2.6  HGB 10.8* 11.2*  HCT 32.5* 33.4*  MCV 83.5 82.9  PLT 244 266  Results for [name], [name] (MRN [id number]) as of 08/17/2013 08:17  Ref. Range 08/13/2013 06:10  C difficile by pcr Latest Range: NEGATIVE  NEGATIVE    Results for [name], [name] (MRN [id number]) as of 08/17/2013 08:17  Ref. Range 08/10/2013 22:30  Enterovirus  PCR Latest Range: Not Detected  Not Detected  HSV 1 DNA Latest Range: Not Detected  Not Detected  HSV 2 DNA Latest Range: Not Detected  Not Detected  Specimen source hsv No range found CSF  Crypto Ag Latest Range:  NEGATIVE  NEGATIVE  Cryptococcal Ag Titer Latest Range: NOT INDICATED  NOT INDICATED   Results for [name], [name] (MRN [id number]) as of 08/17/2013 08:17  Ref. Range 08/10/2013 22:30 08/10/2013 22:30  Glucose, CSF Latest Range: 43-76 mg/dL 74   Total  Protein, CSF Latest Range: 15-45 mg/dL 82 (H)   RBC Count, CSF Latest Range: 0 /cu mm 9 (H) 7 (H)  WBC, CSF Latest Range: 0-5 /cu mm 32 (HH) 33 (HH)  Segmented Neutrophils-CSF Latest Range: 0-6 % 0 0  Lymphs, CSF Latest Range: 40-80 % 98 (H) 97 (H)  Monocyte-Macrophage-Spinal Fluid Latest Range: 15-45 % 2 (L) 3 (L)  Eosinophils, CSF Latest Range: 0-1 % 0 0  Other Cells, CSF No range found 0 0  Appearance, CSF Latest Range: CLEAR  CLEAR (A) CLEAR (A)  Color, CSF Latest Range: COLORLESS  COLORLESS COLORLESS  Supernatant No range found NOT INDICATED NOT INDICATED  Tube # No range found 2 1     Discharge instructions: Resume diabetes management as before. Call if any fevers or chills or sweats happen. Let me know if the diarrhea returns   Disposition: To home  Follow-up Appts: Follow-up with Dr. Evlyn Kanner at [facility] in 1-2 weeks  Call for appointment.  Condition on Discharge: Improved  Tests Needing Follow-up: She will need repeat of her electrolytes and her blood count.  SignedJulian Hy 08/17/2013, 8:18 AM

## 2013-08-17 NOTE — Progress Notes (Signed)
Patients IV site infiltrate, IV team attempted  X 2, before husband refused to have her stuck again.

## 2013-08-18 ENCOUNTER — Ambulatory Visit (INDEPENDENT_AMBULATORY_CARE_PROVIDER_SITE_OTHER): Payer: Medicare Other | Admitting: Neurology

## 2013-08-18 ENCOUNTER — Encounter: Payer: Self-pay | Admitting: Neurology

## 2013-08-18 VITALS — BP 110/61 | HR 70 | Temp 98.6°F

## 2013-08-18 DIAGNOSIS — F0391 Unspecified dementia with behavioral disturbance: Secondary | ICD-10-CM

## 2013-08-18 DIAGNOSIS — R413 Other amnesia: Secondary | ICD-10-CM

## 2013-08-18 MED ORDER — RIVASTIGMINE 4.6 MG/24HR TD PT24: 1.0000 | MEDICATED_PATCH | Freq: Every day | TRANSDERMAL | Status: DC

## 2013-08-18 MED ORDER — RIVASTIGMINE 9.5 MG/24HR TD PT24: 1.0000 | MEDICATED_PATCH | Freq: Every day | TRANSDERMAL | Status: DC

## 2013-08-18 NOTE — Progress Notes (Signed)
[facility] [address] [place]. [place] [address] [phone]       OFFICE CONSULT NOTE  [name] Date of Birth:  1941-01-06 Medical Record Number:  [id number]   Referring MD:  Adrian Prince, MD Reason for Referral:  Dementia  HPI: Ms [name] is a 35 year African American lady who is unable to provide significant history which is provided by her husband as well as review of chart and referral notes. She has had progressive memory loss and cognitive impairment over the last 3-4 years. This was mainly noticed when she initially had episode of Urinary tract infection. She has had multiple UTI episodes 4 times per year over the last several years and during each of these she has delirium and she recovers partially with and cognitive impairment. Her husband has to do practically everything for her now days. He she needs help with bathing, changing a close getting on and off the commode. She can feed herself. She cannot cook or walk. She had long-standing chronic back problems for 3 ruptured disc and had surgery 7 years ago by Dr. Sedonia Small but she has residual chronic pain and has been using her electric wheelchair now for the last 1 year and does not get up and walk. She was previously able to walk with a walker. She also has bilateral frozen shoulder and significant pain and is unable to chili with her shoulders we'll she has no known history of stroke, TIA, seizure or significant head injury with loss of consciousness. She has no delirium at present or hallucinations, delusions or any agitation. She has not had evaluation for for dementia as per the husband or seen a neurologist. She's had multiple CT scans of the head which have shown mild generalized atrophy and changes of small vessel disease. There is no family history of Alzheimer's dementia.  ROS:   14 system review of systems is positive for weight loss, , Hearing loss, loss of vision, memory loss, confusion, weakness  and numbness  PMH:  Past Medical History  Diagnosis Date  . Diabetes mellitus   . Diabetic neuropathy   . Hypothyroidism   . Hypertension   . GERD (gastroesophageal reflux disease)   . Heart murmur     not treated for  . Arthritis   . Chronic kidney disease     RENAL TRANSPLANT IN 2008--PT IS FOLLOWED BY DR. Karie Fetch WITH BUN OF 37 AND CREAT 1.7--PER NOTE DR. NESI FROM 04/11/12  . Urethral diverticulum   . Eyesight diminished     GLAUCOMA   . History of shingles     LEFT EYE JAN 2012--STILL HAS EYE PAIN-STATES NOT ABLE TO TAKE THE MEDICATION FOR SHINGLES BECAUSE OF HER HX OF KIDNEY TRANSPLANT  . Fingernail abnormalities     FUNGUS OF FINGERNAILS  . UTI (urinary tract infection) 05/18/2013    Social History:  History   Social History  . Marital Status: Married    Spouse Name: N/A    Number of Children: 1  . Years of Education: 12th   Occupational History  . retired    Social History Main Topics  . Smoking status: Former Smoker -- 0.50 packs/day for 20 years    Quit date: 10/21/1978  . Smokeless tobacco: Never Used  . Alcohol Use: No  . Drug Use: No  . Sexual Activity: Yes   Other Topics Concern  . Not on file   Social History Narrative  . No narrative on file  Medications:   Current Outpatient Prescriptions on File Prior to Visit  Medication Sig Dispense Refill  . allopurinol (ZYLOPRIM) 300 MG tablet Take 150 mg by mouth daily.      Marland Kitchen amLODipine (NORVASC) 10 MG tablet Take 1 tablet (10 mg total) by mouth daily with breakfast.  30 tablet  0  . aspirin-acetaminophen-caffeine (EXCEDRIN MIGRAINE) 250-250-65 MG per tablet Take 2 tablets by mouth every 6 (six) hours as needed for pain.      . calcitRIOL (ROCALTROL) 0.5 MCG capsule Take 1 capsule (0.5 mcg total) by mouth daily with breakfast.  30 capsule  0  . furosemide (LASIX) 40 MG tablet Take 80 mg by mouth 2 (two) times daily.      Marland Kitchen gabapentin (NEURONTIN) 300 MG capsule Take 300 mg by mouth 2 (two)  times daily.      . insulin detemir (LEVEMIR) 100 UNIT/ML injection Inject 16 Units into the skin daily.      . insulin lispro (HUMALOG) 100 UNIT/ML injection Inject 2-4 Units into the skin 3 (three) times daily before meals. Per sliding scale      . labetalol (NORMODYNE) 200 MG tablet Take 1.5 tablets (300 mg total) by mouth 3 (three) times daily. Takes 1 and 1/2 tabs  120 tablet  0  . latanoprost (XALATAN) 0.005 % ophthalmic solution Place 1 drop into both eyes at bedtime.      Marland Kitchen levothyroxine (SYNTHROID, LEVOTHROID) 112 MCG tablet Take 2 tablets (224 mcg total) by mouth daily with breakfast.  60 tablet  0  . metroNIDAZOLE (FLAGYL) 500 MG tablet Take 1 tablet (500 mg total) by mouth every 8 (eight) hours.  15 tablet  0  . mycophenolate (CELLCEPT) 250 MG capsule Take 2 capsules (500 mg total) by mouth 2 (two) times daily.  120 capsule  0  . potassium chloride SA (K-DUR,KLOR-CON) 20 MEQ tablet Take 1 tablet (20 mEq total) by mouth daily.  14 tablet  0  . predniSONE (DELTASONE) 5 MG tablet Take 2 tablets (10 mg total) by mouth daily with breakfast.  60 tablet  0  . ranitidine (ZANTAC) 150 MG tablet Take 1 tablet (150 mg total) by mouth daily.  30 tablet  0  . sirolimus (RAPAMUNE) 1 MG tablet Take 3 tablets (3 mg total) by mouth daily. Pt takes three 1 mg tablets = 3 mg daily  90 tablet  0   Current Facility-Administered Medications on File Prior to Visit  Medication Dose Route Frequency Provider Last Rate Last Dose  . clindamycin (CLEOCIN) 2 % vaginal cream 1 Applicatorful  1 Applicatorful Vaginal QHS Antony Haste, MD        Allergies:   Allergies  Allergen Reactions  . Adhesive [Tape]     Skin irritation, "pulls the skin off"   . Aspirin     Crawling feeling under skin  . Hydrocodone Other (See Comments)    Hallucinations   . Iohexol      Desc: pt unable to tell what the reaction was   . Oxycodone     hallucinations  . Penicillins Hives and Swelling    Has tolerated  cefazolin and ceftriaxone.  . Tacrolimus Other (See Comments)    Hallucinations   . Contrast Media [Iodinated Diagnostic Agents] Hives, Swelling and Rash    Physical Exam General: well developed, well nourished, seated, in no evident distress Head: head normocephalic and atraumatic. Orohparynx benign Neck: supple with no carotid or supraclavicular bruits Cardiovascular: regular rate and rhythm, no  murmurs Musculoskeletal: no deformity Skin:  no rash/petichiae. 1+ ankle and pedal edema bilaterally Vascular:  Normal pulses all extremities Filed Vitals:   08/18/13 1303  BP: 110/61  Pulse: 70  Temp: 98.6 F (37 C)    Neurologic Exam Mental Status: Awake and fully alert. Oriented to place and time. Recent and remote memory diminished. Attention span, concentration and fund of knowledge reduced Mood and affect appropriate. Mini-Mental status exam scored 13/27 animal fluency test scores for unable to copy intersecting pentagons or do  Clock drawing Geriatric depression scale 1 only. Cranial Nerves: Fundoscopic exam reveals sharp disc margins. Pupils equal, briskly reactive to light. Extraocular movements full without nystagmus. Visual fields full to confrontation. Hearing mildy diminished bilaterally. Facial sensation intact. Face, tongue, palate moves normally and symmetrically.  Motor: Normal bulk and tone. Normal strength in upper extremity muscles except for shoulder abduction limited to 2/5 bilaterally due to severe pain. Lower extremity exam shows 3/5 strength at the hip and knees and bilateral ankle dorsiflexor weakness with foot drop and 3/5 ankle plantar flexor strength. She keeps her right leg flexed at the hip and extended at the knee resting on her husband's thighs as  this position relieves her back pain. Sensory.: diminished touch and pinprick and vibratory sensation from ankle down bilaterally.  Coordination: Normal in the upper extremities and cannot be tested in the lower  extremities  Gait and Station: Unable to arise from her electric wheelchair due to chronic severe back pain and leg weakness. Reflexes: 2+ and symmetric. Toes downgoing.     ASSESSMENT: 65 year African American lady with progressive memory loss and cognitive worsening over the years precipitated by multiple bouts of bladder infections followed by delirium likely due to Alzheimer's dementia. Chronic gait difficulties from degenerative back disease, chronic pain medications and multiple medical problems of diabetes, hypertension, hypothyroidism, chronic kidney disease and malnutrition.    PLAN: I had a long discussion with the patient and her husband regarding her cognitive evaluation, treatment plan and answered questions. Check labs for treatable causes of memory loss, EEG and MRI scan of the brain. Start Exelon patch 4.6 mg/24 hours for one month and if tolerated increase to 9.5 mg/24-hour patch. I have discussed possible side effects with the patient's husband and advised him to call if necessary. Return for followup in 6 weeks or call earlier if necessary.

## 2013-08-18 NOTE — Patient Instructions (Signed)
Check labs for treatable causes of memory loss, EEG and MRI scan of the brain. Start Exelon patch 4.6 mg/24 hours for one month and if tolerated increase to 9.5 mg/24-hour patch. I have discussed possible side effects with the patient's husband and advised him to call if necessary. Return for followup in 6 weeks or call earlier if necessary.

## 2013-08-19 LAB — RPR: RPR: NONREACTIVE

## 2013-08-24 NOTE — Progress Notes (Signed)
Quick Note:  I called and spoke to husband and gave him the lab results. He verbalized understanding. He then asked about samples of exelon < cost is over $300.00 , he is working with a program to get at lower cost. I will give 3 wk samples of 4.6mg  patch. Placed up front. ______

## 2013-08-26 ENCOUNTER — Telehealth: Payer: Self-pay | Admitting: *Deleted

## 2013-08-26 NOTE — Telephone Encounter (Signed)
Talked to patient husband and he has picked up samples for his wife , medication Exelon 4.6 mg/24. Until see can get them free  With the program she in.

## 2013-09-02 ENCOUNTER — Other Ambulatory Visit (INDEPENDENT_AMBULATORY_CARE_PROVIDER_SITE_OTHER): Payer: Medicare Other | Admitting: Radiology

## 2013-09-02 DIAGNOSIS — F0391 Unspecified dementia with behavioral disturbance: Secondary | ICD-10-CM

## 2013-09-02 DIAGNOSIS — R413 Other amnesia: Secondary | ICD-10-CM

## 2013-09-09 ENCOUNTER — Ambulatory Visit
Admission: RE | Admit: 2013-09-09 | Discharge: 2013-09-09 | Disposition: A | Discharge status: Home / Self Care | Payer: PRIVATE HEALTH INSURANCE | Source: Ambulatory Visit | Attending: Neurology | Admitting: Neurology

## 2013-09-09 DIAGNOSIS — R413 Other amnesia: Secondary | ICD-10-CM

## 2013-09-09 DIAGNOSIS — F0391 Unspecified dementia with behavioral disturbance: Secondary | ICD-10-CM

## 2013-09-13 ENCOUNTER — Telehealth: Payer: Self-pay | Admitting: *Deleted

## 2013-09-13 NOTE — Telephone Encounter (Signed)
I called [name] and gave her the order after consulting Dr. Pearlean Brownie to continue 4.6mg  exelon patch for another month and then to increase to 9.5mg  patch if no problems.  She verbalized understanding.  T his is related to financial assistance program.

## 2013-09-13 NOTE — Telephone Encounter (Signed)
Called about this pt.   I called and LMVM for her to return call.

## 2013-09-29 ENCOUNTER — Ambulatory Visit: Payer: Medicare Other | Admitting: Neurology

## 2013-09-30 ENCOUNTER — Inpatient Hospital Stay (HOSPITAL_COMMUNITY)
Admission: EM | Admit: 2013-09-30 | Discharge: 2013-10-07 | DRG: 689 | Disposition: A | Discharge status: Home / Self Care | Payer: Medicare Other | Attending: Endocrinology | Admitting: Endocrinology

## 2013-09-30 ENCOUNTER — Emergency Department (HOSPITAL_COMMUNITY): Payer: Medicare Other

## 2013-09-30 ENCOUNTER — Encounter (HOSPITAL_COMMUNITY): Payer: Self-pay | Admitting: Emergency Medicine

## 2013-09-30 DIAGNOSIS — E039 Hypothyroidism, unspecified: Secondary | ICD-10-CM | POA: Diagnosis present

## 2013-09-30 DIAGNOSIS — K219 Gastro-esophageal reflux disease without esophagitis: Secondary | ICD-10-CM | POA: Diagnosis present

## 2013-09-30 DIAGNOSIS — Z794 Long term (current) use of insulin: Secondary | ICD-10-CM

## 2013-09-30 DIAGNOSIS — E1149 Type 2 diabetes mellitus with other diabetic neurological complication: Secondary | ICD-10-CM | POA: Diagnosis present

## 2013-09-30 DIAGNOSIS — N390 Urinary tract infection, site not specified: Principal | ICD-10-CM | POA: Diagnosis present

## 2013-09-30 DIAGNOSIS — Z940 Kidney transplant status: Secondary | ICD-10-CM

## 2013-09-30 DIAGNOSIS — R627 Adult failure to thrive: Secondary | ICD-10-CM | POA: Diagnosis present

## 2013-09-30 DIAGNOSIS — I10 Essential (primary) hypertension: Secondary | ICD-10-CM | POA: Diagnosis present

## 2013-09-30 DIAGNOSIS — Z79899 Other long term (current) drug therapy: Secondary | ICD-10-CM

## 2013-09-30 DIAGNOSIS — M109 Gout, unspecified: Secondary | ICD-10-CM | POA: Diagnosis present

## 2013-09-30 DIAGNOSIS — E1142 Type 2 diabetes mellitus with diabetic polyneuropathy: Secondary | ICD-10-CM | POA: Diagnosis present

## 2013-09-30 DIAGNOSIS — Z993 Dependence on wheelchair: Secondary | ICD-10-CM

## 2013-09-30 DIAGNOSIS — F0390 Unspecified dementia without behavioral disturbance: Secondary | ICD-10-CM

## 2013-09-30 DIAGNOSIS — E43 Unspecified severe protein-calorie malnutrition: Secondary | ICD-10-CM | POA: Diagnosis present

## 2013-09-30 DIAGNOSIS — Z889 Allergy status to unspecified drugs, medicaments and biological substances status: Secondary | ICD-10-CM

## 2013-09-30 DIAGNOSIS — H409 Unspecified glaucoma: Secondary | ICD-10-CM | POA: Diagnosis present

## 2013-09-30 DIAGNOSIS — R259 Unspecified abnormal involuntary movements: Secondary | ICD-10-CM | POA: Diagnosis present

## 2013-09-30 DIAGNOSIS — G309 Alzheimer's disease, unspecified: Secondary | ICD-10-CM | POA: Diagnosis present

## 2013-09-30 DIAGNOSIS — N179 Acute kidney failure, unspecified: Secondary | ICD-10-CM

## 2013-09-30 DIAGNOSIS — G9341 Metabolic encephalopathy: Secondary | ICD-10-CM | POA: Diagnosis present

## 2013-09-30 DIAGNOSIS — R739 Hyperglycemia, unspecified: Secondary | ICD-10-CM

## 2013-09-30 DIAGNOSIS — Z880 Allergy status to penicillin: Secondary | ICD-10-CM

## 2013-09-30 DIAGNOSIS — B0230 Zoster ocular disease, unspecified: Secondary | ICD-10-CM | POA: Diagnosis present

## 2013-09-30 DIAGNOSIS — IMO0002 Reserved for concepts with insufficient information to code with codable children: Secondary | ICD-10-CM

## 2013-09-30 DIAGNOSIS — A419 Sepsis, unspecified organism: Secondary | ICD-10-CM

## 2013-09-30 DIAGNOSIS — E860 Dehydration: Secondary | ICD-10-CM

## 2013-09-30 DIAGNOSIS — R5383 Other fatigue: Secondary | ICD-10-CM

## 2013-09-30 DIAGNOSIS — F0280 Dementia in other diseases classified elsewhere without behavioral disturbance: Secondary | ICD-10-CM | POA: Diagnosis present

## 2013-09-30 DIAGNOSIS — R011 Cardiac murmur, unspecified: Secondary | ICD-10-CM | POA: Diagnosis present

## 2013-09-30 DIAGNOSIS — Z87891 Personal history of nicotine dependence: Secondary | ICD-10-CM

## 2013-09-30 HISTORY — DX: Urinary tract infection, site not specified: N39.0

## 2013-09-30 LAB — URINALYSIS, ROUTINE W REFLEX MICROSCOPIC
Glucose, UA: 250 mg/dL — AB
Protein, ur: 100 mg/dL — AB
pH: 5 (ref 5.0–8.0)

## 2013-09-30 LAB — COMPREHENSIVE METABOLIC PANEL
AST: 9 U/L (ref 0–37)
Albumin: 3.2 g/dL — ABNORMAL LOW (ref 3.5–5.2)
Alkaline Phosphatase: 55 U/L (ref 39–117)
BUN: 43 mg/dL — ABNORMAL HIGH (ref 6–23)
CO2: 31 mEq/L (ref 19–32)
Calcium: 9.3 mg/dL (ref 8.4–10.5)
Chloride: 98 mEq/L (ref 96–112)
Creatinine, Ser: 1.92 mg/dL — ABNORMAL HIGH (ref 0.50–1.10)
GFR calc Af Amer: 29 mL/min — ABNORMAL LOW (ref >90)
Glucose, Bld: 298 mg/dL — ABNORMAL HIGH (ref 70–99)
Sodium: 143 mEq/L (ref 135–145)
Total Bilirubin: 0.7 mg/dL (ref 0.3–1.2)
Total Protein: 6.5 g/dL (ref 6.0–8.3)

## 2013-09-30 LAB — URINE MICROSCOPIC-ADD ON

## 2013-09-30 LAB — CBC WITH DIFFERENTIAL/PLATELET
Basophils Absolute: 0 10*3/uL (ref 0.0–0.1)
Basophils Relative: 0 % (ref 0–1)
Eosinophils Absolute: 0 10*3/uL (ref 0.0–0.7)
Eosinophils Relative: 0 % (ref 0–5)
HCT: 38.8 % (ref 36.0–46.0)
Hemoglobin: 12.6 g/dL (ref 12.0–15.0)
Lymphocytes Relative: 9 % — ABNORMAL LOW (ref 12–46)
Lymphs Abs: 1.2 10*3/uL (ref 0.7–4.0)
MCH: 28.2 pg (ref 26.0–34.0)
MCHC: 32.5 g/dL (ref 30.0–36.0)
MCV: 86.8 fL (ref 78.0–100.0)
Monocytes Absolute: 0.5 10*3/uL (ref 0.1–1.0)
Neutro Abs: 11.1 10*3/uL — ABNORMAL HIGH (ref 1.7–7.7)
Neutrophils Relative %: 87 % — ABNORMAL HIGH (ref 43–77)
RDW: 17.1 % — ABNORMAL HIGH (ref 11.5–15.5)

## 2013-09-30 LAB — HEMOGLOBIN A1C
Hgb A1c MFr Bld: 8.3 % — ABNORMAL HIGH (ref <5.7)
Mean Plasma Glucose: 192 mg/dL — ABNORMAL HIGH (ref <117)

## 2013-09-30 LAB — AMMONIA: Ammonia: 32 umol/L (ref 11–60)

## 2013-09-30 LAB — GLUCOSE, CAPILLARY
Glucose-Capillary: 335 mg/dL — ABNORMAL HIGH (ref 70–99)
Glucose-Capillary: 230 mg/dL — ABNORMAL HIGH (ref 70–99)

## 2013-09-30 LAB — LACTIC ACID, PLASMA: Lactic Acid, Venous: 1.2 mmol/L (ref 0.5–2.2)

## 2013-09-30 MED ORDER — INSULIN ASPART 100 UNIT/ML ~~LOC~~ SOLN
0.0000 [IU] | Freq: Three times a day (TID) | SUBCUTANEOUS | Status: DC
  Administered 2013-09-30: 17:00:00 5 [IU] via SUBCUTANEOUS
  Administered 2013-10-01 – 2013-10-02 (×2): 3 [IU] via SUBCUTANEOUS
  Administered 2013-10-02 – 2013-10-03 (×2): 2 [IU] via SUBCUTANEOUS
  Administered 2013-10-03: 18:00:00 7 [IU] via SUBCUTANEOUS
  Administered 2013-10-03: 2 [IU] via SUBCUTANEOUS
  Administered 2013-10-04: 5 [IU] via SUBCUTANEOUS
  Administered 2013-10-04: 08:00:00 1 [IU] via SUBCUTANEOUS
  Administered 2013-10-04: 7 [IU] via SUBCUTANEOUS
  Administered 2013-10-05: 18:00:00 2 [IU] via SUBCUTANEOUS
  Administered 2013-10-06 – 2013-10-07 (×2): 3 [IU] via SUBCUTANEOUS

## 2013-09-30 MED ORDER — SODIUM CHLORIDE 0.9 % IV BOLUS (SEPSIS)
500.0000 mL | Freq: Once | INTRAVENOUS | Status: AC
  Administered 2013-09-30: 500 mL via INTRAVENOUS

## 2013-09-30 MED ORDER — ENOXAPARIN SODIUM 30 MG/0.3ML ~~LOC~~ SOLN
30.0000 mg | SUBCUTANEOUS | Status: DC
  Administered 2013-09-30 – 2013-10-04 (×5): 30 mg via SUBCUTANEOUS
  Filled 2013-09-30 (×6): qty 0.3

## 2013-09-30 MED ORDER — PREDNISONE 10 MG PO TABS
10.0000 mg | ORAL_TABLET | Freq: Every day | ORAL | Status: DC
  Administered 2013-10-01 – 2013-10-07 (×7): 10 mg via ORAL
  Filled 2013-09-30 (×8): qty 1

## 2013-09-30 MED ORDER — INSULIN GLARGINE 100 UNIT/ML ~~LOC~~ SOLN
20.0000 [IU] | Freq: Every day | SUBCUTANEOUS | Status: DC
  Administered 2013-09-30: 20 [IU] via SUBCUTANEOUS
  Filled 2013-09-30 (×2): qty 0.2

## 2013-09-30 MED ORDER — AMLODIPINE BESYLATE 10 MG PO TABS
10.0000 mg | ORAL_TABLET | Freq: Every day | ORAL | Status: DC
  Administered 2013-10-01 – 2013-10-07 (×7): 10 mg via ORAL
  Filled 2013-09-30 (×9): qty 1

## 2013-09-30 MED ORDER — GABAPENTIN 300 MG PO CAPS
600.0000 mg | ORAL_CAPSULE | Freq: Two times a day (BID) | ORAL | Status: DC
  Administered 2013-10-01 – 2013-10-03 (×6): 600 mg via ORAL
  Filled 2013-09-30 (×10): qty 2

## 2013-09-30 MED ORDER — LEVOTHYROXINE SODIUM 112 MCG PO TABS
224.0000 ug | ORAL_TABLET | Freq: Every day | ORAL | Status: DC
  Administered 2013-10-01 – 2013-10-07 (×7): 224 ug via ORAL
  Filled 2013-09-30 (×8): qty 2

## 2013-09-30 MED ORDER — DEXTROSE 5 % IV SOLN
1.0000 g | INTRAVENOUS | Status: DC
  Filled 2013-09-30: qty 10

## 2013-09-30 MED ORDER — FAMOTIDINE 20 MG PO TABS
20.0000 mg | ORAL_TABLET | Freq: Every day | ORAL | Status: DC
  Administered 2013-10-01 – 2013-10-06 (×6): 20 mg via ORAL
  Filled 2013-09-30 (×8): qty 1

## 2013-09-30 MED ORDER — ENOXAPARIN SODIUM 40 MG/0.4ML ~~LOC~~ SOLN: 40.0000 mg | SUBCUTANEOUS | Status: DC

## 2013-09-30 MED ORDER — LABETALOL HCL 300 MG PO TABS
300.0000 mg | ORAL_TABLET | Freq: Three times a day (TID) | ORAL | Status: DC
  Administered 2013-10-01 – 2013-10-07 (×18): 300 mg via ORAL
  Filled 2013-09-30 (×23): qty 1

## 2013-09-30 MED ORDER — MYCOPHENOLATE MOFETIL 250 MG PO CAPS
500.0000 mg | ORAL_CAPSULE | Freq: Two times a day (BID) | ORAL | Status: DC
  Administered 2013-10-01 – 2013-10-06 (×12): 500 mg via ORAL
  Filled 2013-09-30 (×15): qty 2

## 2013-09-30 MED ORDER — ACETAMINOPHEN 650 MG RE SUPP
650.0000 mg | Freq: Once | RECTAL | Status: AC
  Administered 2013-09-30: 650 mg via RECTAL
  Filled 2013-09-30: qty 1

## 2013-09-30 MED ORDER — FENTANYL 12 MCG/HR TD PT72: 12.5000 ug | MEDICATED_PATCH | TRANSDERMAL | Status: DC

## 2013-09-30 MED ORDER — DEXTROSE 5 % IV SOLN: 1.0000 g | INTRAVENOUS | Status: DC

## 2013-09-30 MED ORDER — FENTANYL 50 MCG/HR TD PT72
62.5000 ug | MEDICATED_PATCH | TRANSDERMAL | Status: DC
  Filled 2013-09-30 (×2): qty 1

## 2013-09-30 MED ORDER — VANCOMYCIN HCL IN DEXTROSE 1-5 GM/200ML-% IV SOLN
1000.0000 mg | Freq: Once | INTRAVENOUS | Status: AC
  Administered 2013-09-30: 1000 mg via INTRAVENOUS
  Filled 2013-09-30: qty 200

## 2013-09-30 MED ORDER — DEXTROSE 5 % IV SOLN
1.0000 g | Freq: Once | INTRAVENOUS | Status: AC
  Administered 2013-09-30: 1 g via INTRAVENOUS
  Filled 2013-09-30: qty 10

## 2013-09-30 MED ORDER — LATANOPROST 0.005 % OP SOLN
1.0000 [drp] | Freq: Every day | OPHTHALMIC | Status: DC
  Administered 2013-09-30 – 2013-10-06 (×7): 1 [drp] via OPHTHALMIC
  Filled 2013-09-30: qty 2.5

## 2013-09-30 MED ORDER — ALLOPURINOL 150 MG HALF TABLET
150.0000 mg | ORAL_TABLET | Freq: Every day | ORAL | Status: DC
  Administered 2013-10-01 – 2013-10-06 (×6): 150 mg via ORAL
  Filled 2013-09-30 (×8): qty 1

## 2013-09-30 MED ORDER — INSULIN ASPART 100 UNIT/ML ~~LOC~~ SOLN
0.0000 [IU] | Freq: Every day | SUBCUTANEOUS | Status: DC
  Administered 2013-09-30 – 2013-10-03 (×3): 2 [IU] via SUBCUTANEOUS
  Administered 2013-10-04 – 2013-10-05 (×2): 3 [IU] via SUBCUTANEOUS
  Administered 2013-10-06: 22:00:00 2 [IU] via SUBCUTANEOUS

## 2013-09-30 MED ORDER — MYCOPHENOLATE MOFETIL HCL 500 MG IV SOLR
500.0000 mg | Freq: Once | INTRAVENOUS | Status: AC
  Administered 2013-09-30: 23:00:00 500 mg via INTRAVENOUS
  Filled 2013-09-30: qty 15

## 2013-09-30 MED ORDER — SODIUM CHLORIDE 0.9 % IV BOLUS (SEPSIS)
500.0000 mL | Freq: Once | INTRAVENOUS | Status: AC
  Administered 2013-09-30: 1000 mL via INTRAVENOUS

## 2013-09-30 MED ORDER — SIROLIMUS 1 MG PO TABS
3.0000 mg | ORAL_TABLET | Freq: Every day | ORAL | Status: DC
  Administered 2013-10-01 – 2013-10-06 (×6): 3 mg via ORAL
  Filled 2013-09-30 (×8): qty 3

## 2013-09-30 MED ORDER — VANCOMYCIN HCL IN DEXTROSE 750-5 MG/150ML-% IV SOLN
750.0000 mg | INTRAVENOUS | Status: DC
  Administered 2013-10-01 – 2013-10-03 (×3): 750 mg via INTRAVENOUS
  Filled 2013-09-30 (×3): qty 150

## 2013-09-30 MED ORDER — CALCITRIOL 0.5 MCG PO CAPS
0.5000 ug | ORAL_CAPSULE | Freq: Every day | ORAL | Status: DC
  Administered 2013-10-01 – 2013-10-07 (×7): 0.5 ug via ORAL
  Filled 2013-09-30 (×8): qty 1

## 2013-09-30 MED ORDER — FENTANYL 25 MCG/HR TD PT72
25.0000 ug | MEDICATED_PATCH | TRANSDERMAL | Status: DC
  Administered 2013-10-02 – 2013-10-05 (×2): 25 ug via TRANSDERMAL
  Filled 2013-09-30 (×2): qty 1

## 2013-09-30 MED ORDER — FENTANYL 50 MCG/HR TD PT72: 50.0000 ug | MEDICATED_PATCH | TRANSDERMAL | Status: DC

## 2013-09-30 MED ORDER — POTASSIUM CHLORIDE CRYS ER 20 MEQ PO TBCR
20.0000 meq | EXTENDED_RELEASE_TABLET | Freq: Every day | ORAL | Status: DC
  Administered 2013-10-01 – 2013-10-06 (×6): 20 meq via ORAL
  Filled 2013-09-30 (×7): qty 1

## 2013-09-30 MED ORDER — SODIUM CHLORIDE 0.9 % IV SOLN
INTRAVENOUS | Status: DC
  Administered 2013-09-30: 22:00:00 via INTRAVENOUS

## 2013-09-30 NOTE — ED Notes (Signed)
Husband staes pt is showing s/s of UTI states she has gotten weaker and and is less responsive and that happens when she has one. pt is a kidney transplant pt also has bad cough congested husband states

## 2013-09-30 NOTE — Progress Notes (Signed)
[name] [id number] Code Status: full   Admission Data: 09/30/2013 3:33 PM Attending Provider:  [facility] ONG:EXBMW,UXLKGMW Erika Diener, MD Consults/ Treatment Team:    [name] is a 72 y.o. female patient admitted from ED awake, alert - oriented  X 3 - no acute distress noted.  VSS - Blood pressure 119/50, pulse 74, temperature 100.3 F (37.9 C), temperature source Rectal, resp. rate 14, height 5\' 3"  (1.6 m), weight 63.6 kg (140 lb 3.4 oz), SpO2 100.00%.  no c/o shortness of breath, no c/o chest pain.   IV Fluids:  IV in place, occlusive dsg intact without redness, IV cath antecubital right, condition patent and no redness normal saline.  Allergies:   Allergies  Allergen Reactions  . Adhesive [Tape]     Skin irritation, "pulls the skin off"   . Aspirin     Crawling feeling under skin  . Hydrocodone Other (See Comments)    Hallucinations   . Iohexol      Desc: pt unable to tell what the reaction was   . Oxycodone     hallucinations  . Penicillins Hives and Swelling    Has tolerated cefazolin and ceftriaxone.  . Tacrolimus Other (See Comments)    Hallucinations   . Contrast Media [Iodinated Diagnostic Agents] Hives, Swelling and Rash     Past Medical History  Diagnosis Date  . Diabetes mellitus   . Diabetic neuropathy   . Hypothyroidism   . Hypertension   . GERD (gastroesophageal reflux disease)   . Heart murmur     not treated for  . Arthritis   . Chronic kidney disease     RENAL TRANSPLANT IN 2008--PT IS FOLLOWED BY DR. Karie Fetch WITH BUN OF 37 AND CREAT 1.7--PER NOTE DR. NESI FROM 04/11/12  . Urethral diverticulum   . Eyesight diminished     GLAUCOMA   . History of shingles     LEFT EYE JAN 2012--STILL HAS EYE PAIN-STATES NOT ABLE TO TAKE THE MEDICATION FOR SHINGLES BECAUSE OF HER HX OF KIDNEY TRANSPLANT  . Fingernail abnormalities     FUNGUS OF FINGERNAILS  . UTI (urinary tract infection) 05/18/2013   Medications Prior to Admission  Medication Sig Dispense  Refill  . allopurinol (ZYLOPRIM) 300 MG tablet Take 150 mg by mouth daily.      Marland Kitchen amLODipine (NORVASC) 10 MG tablet Take 1 tablet (10 mg total) by mouth daily with breakfast.  30 tablet  0  . calcitRIOL (ROCALTROL) 0.5 MCG capsule Take 1 capsule (0.5 mcg total) by mouth daily with breakfast.  30 capsule  0  . fentaNYL (DURAGESIC - DOSED MCG/HR) 12 MCG/HR Place 12.5 mcg onto the skin every 3 (three) days. Change patches every 3-4 days Wear both 12 mcg and 50 mcg patch at the same time      . fentaNYL (DURAGESIC - DOSED MCG/HR) 50 MCG/HR Place 50 mcg onto the skin See admin instructions. Change patches every 3-4 days Wear both 12 mcg and 50 mcg patch at the same time      . furosemide (LASIX) 40 MG tablet Take 80 mg by mouth 2 (two) times daily.      Marland Kitchen gabapentin (NEURONTIN) 300 MG capsule Take 600 mg by mouth 2 (two) times daily.       . insulin detemir (LEVEMIR) 100 UNIT/ML injection Inject 18 Units into the skin every morning.       . insulin lispro (HUMALOG) 100 UNIT/ML injection Inject 0-4 Units into the skin 3 (  three) times daily before meals. Per sliding scale      . labetalol (NORMODYNE) 200 MG tablet Take 1.5 tablets (300 mg total) by mouth 3 (three) times daily. Takes 1 and 1/2 tabs  120 tablet  0  . latanoprost (XALATAN) 0.005 % ophthalmic solution Place 1 drop into both eyes at bedtime.      Marland Kitchen levothyroxine (SYNTHROID, LEVOTHROID) 112 MCG tablet Take 2 tablets (224 mcg total) by mouth daily with breakfast.  60 tablet  0  . mycophenolate (CELLCEPT) 250 MG capsule Take 2 capsules (500 mg total) by mouth 2 (two) times daily.  120 capsule  0  . potassium chloride SA (K-DUR,KLOR-CON) 20 MEQ tablet Take 1 tablet (20 mEq total) by mouth daily.  14 tablet  0  . predniSONE (DELTASONE) 10 MG tablet Take 10 mg by mouth daily with breakfast.      . ranitidine (ZANTAC) 150 MG tablet Take 1 tablet (150 mg total) by mouth daily.  30 tablet  0  . sirolimus (RAPAMUNE) 1 MG tablet Take 3 tablets (3 mg  total) by mouth daily. Pt takes three 1 mg tablets = 3 mg daily  90 tablet  0   History:  obtained from the patient. Tobacco/alcohol: denied none  Orientation to room, and floor completed with information packet given to patient/family.  Patient declined safety video at this time.  Admission INP armband ID verified with patient/family, and in place.   SR up x 2, fall assessment complete, with patient and family able to verbalize understanding of risk associated with falls, and verbalized understanding to call nsg before up out of bed.  Call light within reach, patient able to voice, and demonstrate understanding.  Skin, clean-dry- intact without evidence of bruising, or skin tears.   No evidence of skin break down noted on exam.     Will cont to eval and treat per MD orders.  Orvan Seen, RN 09/30/2013 3:33 PM

## 2013-09-30 NOTE — Care Management Note (Addendum)
    Page 1 of 2   10/07/2013     12:19:58 PM   CARE MANAGEMENT NOTE 10/07/2013  Patient:  [name], [name]   Account Number:  [id number]  Date Initiated:  09/30/2013  Documentation initiated by:  [name]  Subjective/Objective Assessment:   dx uti,  admit- lives with spouse.  active with [name], for [facility], PT, OT and aide.     Action/Plan:   Anticipated DC Date:  10/07/2013   Anticipated DC Plan:  HOME W HOME HEALTH SERVICES  In-house referral  Clinical Social Worker      DC Planning Services  CM consult      [facility] Choice  Resumption Of Svcs/PTA Provider   Choice offered to / List presented to:  C-1 Patient   DME arranged  HOSPITAL BED  OTHER - SEE COMMENT        HH arranged  HH-2 PT  HH-1 RN  HH-3 OT  HH-4 NURSE'S AIDE  HH-5 SPEECH THERAPY      HH agency  [facility]   Status of service:  Completed, signed off Medicare Important Message given?   (If response is "NO", the following Medicare IM given date fields will be blank) Date Medicare IM given:   Date Additional Medicare IM given:    Discharge Disposition:  HOME W HOME HEALTH SERVICES  Per UR Regulation:  Reviewed for med. necessity/level of care/duration of stay  If discussed at Long Length of Stay Meetings, dates discussed:    Comments:  10/07/13 10:46 Erika Cape RN, BSN [phone] patient is for dc to home with [facility] services today, [name] with [name] notified and speech was added. CSW aware patient needs ambulance transport. NCM explained to [name] that I hospital bed has been ordered and the lift, hopefully insurance will pay for it for chronic back pain and needs positioning. Informed Erika. Russo that the [facility] service center will be in touch with him to coordinate the delivery, he states ok. Informed him bed rental per month would be 128, but hopefully his insurance will take care of it and his co pay will be $20-$30 /month.  10/06/13 14:12 Erika Cape RN, BSN [phone] patient is  active with [name] for [facility], PT, OT and aide. NCM spoke with [name] with [name] to notify patient for dc tomorrow.  Soc will begin 24-48 hrs post discharge.  NCM called [name] and left message on vm to see if he wanted a hospital bed and a lift.  09/30/13 16:53 Erika Cape RN, BSN [phone] patient lives with spouse, NCM will continue to follow for dc needs.

## 2013-09-30 NOTE — Progress Notes (Signed)
ANTIBIOTIC CONSULT NOTE - INITIAL  Pharmacy Consult for vancomycin Indication: UTI  Allergies  Allergen Reactions  . Adhesive [Tape]     Skin irritation, "pulls the skin off"   . Aspirin     Crawling feeling under skin  . Hydrocodone Other (See Comments)    Hallucinations   . Iohexol      Desc: pt unable to tell what the reaction was   . Oxycodone     hallucinations  . Penicillins Hives and Swelling    Has tolerated cefazolin and ceftriaxone.  . Tacrolimus Other (See Comments)    Hallucinations   . Contrast Media [Iodinated Diagnostic Agents] Hives, Swelling and Rash    Patient Measurements: Height: 5\' 3"  (160 cm) Weight: 140 lb 3.4 oz (63.6 kg) IBW/kg (Calculated) : 52.4  Vital Signs: Temp: 100.3 F (37.9 C) (12/11 1257) Temp src: Rectal (12/11 1257) BP: 121/52 mmHg (12/11 1300) Pulse Rate: 78 (12/11 1300) Intake/Output from previous day:   Intake/Output from this shift:    Labs:  Recent Labs  09/30/13 1059  WBC 12.8*  HGB 12.6  PLT 204  CREATININE 1.92*   Estimated Creatinine Clearance: 23.1 ml/min (by C-G formula based on Cr of 1.92). No results found for this basename: VANCOTROUGH, VANCOPEAK, VANCORANDOM, GENTTROUGH, GENTPEAK, GENTRANDOM, TOBRATROUGH, TOBRAPEAK, TOBRARND, AMIKACINPEAK, AMIKACINTROU, AMIKACIN,  in the last 72 hours   Microbiology: No results found for this or any previous visit (from the past 720 hour(s)).  Medical History: Past Medical History  Diagnosis Date  . Diabetes mellitus   . Diabetic neuropathy   . Hypothyroidism   . Hypertension   . GERD (gastroesophageal reflux disease)   . Heart murmur     not treated for  . Arthritis   . Chronic kidney disease     RENAL TRANSPLANT IN 2008--PT IS FOLLOWED BY DR. Karie Fetch WITH BUN OF 37 AND CREAT 1.7--PER NOTE DR. NESI FROM 04/11/12  . Urethral diverticulum   . Eyesight diminished     GLAUCOMA   . History of shingles     LEFT EYE JAN 2012--STILL HAS EYE PAIN-STATES NOT  ABLE TO TAKE THE MEDICATION FOR SHINGLES BECAUSE OF HER HX OF KIDNEY TRANSPLANT  . Fingernail abnormalities     FUNGUS OF FINGERNAILS  . UTI (urinary tract infection) 05/18/2013   Assessment: 40 YOF with hx of kidney transplant admitted with confusion concerning for UTI as this is how patient has presented in past. Worsening dementia vs metabolic encephalopathy also in differential. SCr 1.9. Has already received 1000mg  of vancomycin in the ED.  Goal of Therapy:  Vancomycin trough level 15-20 mcg/ml  Plan:  1. Vancomycin 750mg  IV q24h 2. Follow c/s, LOT, renal function, trough at [facility], clinical progression  Missi Mcmackin D. Trenika Hudson, PharmD, BCPS Clinical Pharmacist Pager: [phone] 09/30/2013 2:06 PM

## 2013-09-30 NOTE — Progress Notes (Signed)
Called for report. Nurse at lunch. Left number for her to call me back.

## 2013-09-30 NOTE — Progress Notes (Signed)
Received report from ED nurse, Misty Stanley.

## 2013-09-30 NOTE — Plan of Care (Signed)
Called b/c continued intolerance to PO and concern about medications not being given.   Upon review, current PM meds are acceptable to be on hold, but I ran immunosuppressants of cellcept and serolimus by the pharmacy and appears cellcept has IV form but serolimus does not.  IV cellcept at 1:1 ratio to PO to be given tonight, OTO.  PCP can address further med recs in the morning  Nursing states she is currently in same clinical condition that she was when seen by MD earlier today. No clinical changes and VSS at this time on RA.   IVF to be started given NPO

## 2013-09-30 NOTE — Progress Notes (Signed)
Contacted on-call MD about NPO status and taking pills. Pt is still lethargic and is barely following commands. I also asked if he wanted to start IVF since she is NPO and admitted with a UTI. On call MD said she would be okay tonight and we would defer decision until the am for Dr. Evlyn Kanner.

## 2013-09-30 NOTE — ED Provider Notes (Signed)
CSN: [id number]     Arrival date & time 09/30/13  1010 History   First MD Initiated Contact with Patient 09/30/13 1013     Chief Complaint  Patient presents with  . Urinary Tract Infection   (Consider location/radiation/quality/duration/timing/severity/associated sxs/prior Treatment) HPI Comments: 72 yo female with metabolic encephalopathy, UTI, DM, renal transplant on steroids/ immunosuppressants (has not missed dose since today, wheelchair bound, dementia presents with AMS.  Since yesterday gradually worsening mentation and activity.  Pt could not swallow her pills today.  No falls or head injury, fever started today. General weakness, nothing improves. Hx per husband.  Mild cough.  Patient is a 72 y.o. female presenting with urinary tract infection. The history is provided by the spouse.  Urinary Tract Infection This is a recurrent problem.    Past Medical History  Diagnosis Date  . Diabetes mellitus   . Diabetic neuropathy   . Hypothyroidism   . Hypertension   . GERD (gastroesophageal reflux disease)   . Heart murmur     not treated for  . Arthritis   . Chronic kidney disease     RENAL TRANSPLANT IN 2008--PT IS FOLLOWED BY DR. Karie Fetch WITH BUN OF 37 AND CREAT 1.7--PER NOTE DR. NESI FROM 04/11/12  . Urethral diverticulum   . Eyesight diminished     GLAUCOMA   . History of shingles     LEFT EYE JAN 2012--STILL HAS EYE PAIN-STATES NOT ABLE TO TAKE THE MEDICATION FOR SHINGLES BECAUSE OF HER HX OF KIDNEY TRANSPLANT  . Fingernail abnormalities     FUNGUS OF FINGERNAILS  . UTI (urinary tract infection) 05/18/2013   Past Surgical History  Procedure Laterality Date  . Kidney transplant  2008    At [facility]  . Back surgery      ruptured disk  . Abdominal hysterectomy    . Appendectomy    . Laser surgery of both eyes for hemorrhages    . 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 Urethral diverticulectomy  04/28/2012    Procedure: URETHRAL DIVERTICULECTOMY;  Surgeon:  Antony Haste, MD;  Location: [facility];  Service: Urology;  Laterality: N/A;   Family History  Problem Relation Age of Onset  . Anesthesia problems Neg Hx    History  Substance Use Topics  . Smoking status: Former Smoker -- 0.50 packs/day for 20 years    Quit date: 10/21/1978  . Smokeless tobacco: Never Used  . Alcohol Use: No   OB History   Grav Para Term Preterm Abortions TAB SAB Ect Mult Living                 Review of Systems  Unable to perform ROS: Patient nonverbal    Allergies  Adhesive; Aspirin; Hydrocodone; Iohexol; Oxycodone; Penicillins; Tacrolimus; and Contrast media  Home Medications   Current Outpatient Rx  Name  Route  Sig  Dispense  Refill  . allopurinol (ZYLOPRIM) 300 MG tablet   Oral   Take 150 mg by mouth daily.         Marland Kitchen amLODipine (NORVASC) 10 MG tablet   Oral   Take 1 tablet (10 mg total) by mouth daily with breakfast.   30 tablet   0   . calcitRIOL (ROCALTROL) 0.5 MCG capsule   Oral   Take 1 capsule (0.5 mcg total) by mouth daily with breakfast.   30 capsule   0   . fentaNYL (DURAGESIC - DOSED MCG/HR) 12 MCG/HR   Transdermal   Place  12.5 mcg onto the skin every 3 (three) days. Change patches every 3-4 days Wear both 12 mcg and 50 mcg patch at the same time         . fentaNYL (DURAGESIC - DOSED MCG/HR) 50 MCG/HR   Transdermal   Place 50 mcg onto the skin See admin instructions. Change patches every 3-4 days Wear both 12 mcg and 50 mcg patch at the same time         . furosemide (LASIX) 40 MG tablet   Oral   Take 80 mg by mouth 2 (two) times daily.         Marland Kitchen gabapentin (NEURONTIN) 300 MG capsule   Oral   Take 600 mg by mouth 2 (two) times daily.          . insulin detemir (LEVEMIR) 100 UNIT/ML injection   Subcutaneous   Inject 18 Units into the skin every morning.          . insulin lispro (HUMALOG) 100 UNIT/ML injection   Subcutaneous   Inject 0-4 Units into the skin 3 (three) times daily before meals. Per  sliding scale         . labetalol (NORMODYNE) 200 MG tablet   Oral   Take 1.5 tablets (300 mg total) by mouth 3 (three) times daily. Takes 1 and 1/2 tabs   120 tablet   0   . latanoprost (XALATAN) 0.005 % ophthalmic solution   Both Eyes   Place 1 drop into both eyes at bedtime.         Marland Kitchen levothyroxine (SYNTHROID, LEVOTHROID) 112 MCG tablet   Oral   Take 2 tablets (224 mcg total) by mouth daily with breakfast.   60 tablet   0   . mycophenolate (CELLCEPT) 250 MG capsule   Oral   Take 2 capsules (500 mg total) by mouth 2 (two) times daily.   120 capsule   0   . potassium chloride SA (K-DUR,KLOR-CON) 20 MEQ tablet   Oral   Take 1 tablet (20 mEq total) by mouth daily.   14 tablet   0   . predniSONE (DELTASONE) 10 MG tablet   Oral   Take 10 mg by mouth daily with breakfast.         . ranitidine (ZANTAC) 150 MG tablet   Oral   Take 1 tablet (150 mg total) by mouth daily.   30 tablet   0   . sirolimus (RAPAMUNE) 1 MG tablet   Oral   Take 3 tablets (3 mg total) by mouth daily. Pt takes three 1 mg tablets = 3 mg daily   90 tablet   0    BP 142/67  Pulse 85  Temp(Src) 101.5 F (38.6 C) (Rectal)  Resp 21  SpO2 89% Physical Exam  Nursing note and vitals reviewed. Constitutional: She appears well-developed and well-nourished. No distress.  lethargy  HENT:  Head: Normocephalic and atraumatic.  Dry mm  Eyes: Right eye exhibits no discharge. Left eye exhibits no discharge.  Left eye closed/ crusted (chronic, opened- pupil 1 mm Right eye pupil 2 mm mild reactive, no dc  Neck: Neck supple. No JVD present. No tracheal deviation present.  Cardiovascular: Normal rate and regular rhythm.   Pulmonary/Chest: Effort normal. She has rales (few at bases, decr effort).  Abdominal: Soft. She exhibits no distension. There is no tenderness. There is no guarding.  Musculoskeletal: She exhibits no edema.  Lymphadenopathy:    She has no cervical adenopathy.  Neurological: She  is alert. A cranial nerve deficit (lethargy) is present. GCS eye subscore is 3. GCS verbal subscore is 3. GCS motor subscore is 4.  General weakness, contractions to LE bilateral Pt moves ext equal bilateral with general weakness Difficult neuro exam due to general confusion/ lethargy   Skin: Skin is warm. No rash noted.  Psychiatric:  lethargy    ED Course  Procedures (including critical care time) Labs Review Labs Reviewed  URINALYSIS, ROUTINE W REFLEX MICROSCOPIC - Abnormal; Notable for the following:    APPearance CLOUDY (*)    Glucose, UA 250 (*)    Hgb urine dipstick LARGE (*)    Protein, ur 100 (*)    Leukocytes, UA LARGE (*)    All other components within normal limits  CBC WITH DIFFERENTIAL - Abnormal; Notable for the following:    WBC 12.8 (*)    RDW 17.1 (*)    Neutrophils Relative % 87 (*)    Lymphocytes Relative 9 (*)    Neutro Abs 11.1 (*)    All other components within normal limits  COMPREHENSIVE METABOLIC PANEL - Abnormal; Notable for the following:    Glucose, Bld 298 (*)    BUN 43 (*)    Creatinine, Ser 1.92 (*)    Albumin 3.2 (*)    GFR calc non Af Amer 25 (*)    GFR calc Af Amer 29 (*)    All other components within normal limits  URINE MICROSCOPIC-ADD ON - Abnormal; Notable for the following:    Bacteria, UA MANY (*)    All other components within normal limits  GLUCOSE, CAPILLARY - Abnormal; Notable for the following:    Glucose-Capillary 335 (*)    All other components within normal limits  URINE CULTURE  CULTURE, BLOOD (ROUTINE X 2)  CULTURE, BLOOD (ROUTINE X 2)  AMMONIA  LACTIC ACID, PLASMA  HEMOGLOBIN A1C   Imaging Review Dg Chest Portable 1 View  09/30/2013   CLINICAL DATA:  Confusion, possible aspiration.  EXAM: PORTABLE CHEST - 1 VIEW  COMPARISON:  August 10, 2013  FINDINGS: The heart size and mediastinal contours are stable. Overlying artifact is noted over the bilateral upper lungs. Both lungs are clear. The visualized skeletal  structures are stable.  IMPRESSION: No active cardiopulmonary disease.   Electronically Signed   By: Sherian Rein M.D.   On: 09/30/2013 11:29    EKG Interpretation    Date/Time:    Ventricular Rate:    PR Interval:    QRS Duration:   QT Interval:    QTC Calculation:   R Axis:     Text Interpretation:              MDM   1. Urosepsis   2. Lethargy   3. ARF (acute renal failure)   4. Dehydration   5. Dementia   6. Hyperglycemia    Concern for UTI/ pneumonia vs metabolic vs other. Blood, fluids, abx and cultures.  Plan for admission.  High risk pt with transplant medicines/ immunosuppressants.   Fluid bolus. Spoke with pharmacy and reviewed UA culture hx to decide abx with pt being transplant pt.  Vanco and rocephin given . Fluid bolus given. Pt improved mildly on recheck, protecting airway at this time.   The patients results and plan were reviewed and discussed.   Any x-rays performed were personally reviewed by myself.   Differential diagnosis were considered with the presenting HPI.  Diagnosis: Encephalopathy, UTI, Dehydration, Renal transplant, ARF  Admission/ observation were discussed with the admitting physician, patient and/or family and they are comfortable with the plan.      Enid Skeens, MD 09/30/13 [phone]

## 2013-09-30 NOTE — H&P (Signed)
PCP:   Julian Hy, MD   Chief Complaint:  confusion  HPI: 72 YO BF with DM2, renal transplant, dementia and recurrent UTI's . Presents again with altered mental status with difficulty taking meds and eating. Some cough. No chills or sweats. Bowels OK. Some eye irritation Was in the ofc about a week ago. Clearly has FTT and fairly rapidly progressive mental decline. Not walking at all. Hardly able to stand. No choking episodes recently  Review of Systems:  Review of Systems -  Past Medical History: Past Medical History  Diagnosis Date  . Diabetes mellitus   . Diabetic neuropathy   . Hypothyroidism   . Hypertension   . GERD (gastroesophageal reflux disease)   . Heart murmur     not treated for  . Arthritis   . Chronic kidney disease     RENAL TRANSPLANT IN 2008--PT IS FOLLOWED BY DR. Karie Fetch WITH BUN OF 37 AND CREAT 1.7--PER NOTE DR. NESI FROM 04/11/12  . Urethral diverticulum   . Eyesight diminished     GLAUCOMA   . History of shingles     LEFT EYE JAN 2012--STILL HAS EYE PAIN-STATES NOT ABLE TO TAKE THE MEDICATION FOR SHINGLES BECAUSE OF HER HX OF KIDNEY TRANSPLANT  . Fingernail abnormalities     FUNGUS OF FINGERNAILS  . UTI (urinary tract infection) 05/18/2013  . UTI (lower urinary tract infection)    Past Surgical History  Procedure Laterality Date  . Kidney transplant  2008    At [facility]  . Back surgery      ruptured disk  . Abdominal hysterectomy    . Appendectomy    . Laser surgery of both eyes for hemorrhages    . 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 Urethral diverticulectomy  04/28/2012    Procedure: URETHRAL DIVERTICULECTOMY;  Surgeon: Antony Haste, MD;  Location: [facility];  Service: Urology;  Laterality: N/A;    Medications: Prior to Admission medications   Medication Sig Start Date End Date Taking? Authorizing Provider  allopurinol (ZYLOPRIM) 300 MG tablet Take 150 mg by mouth daily.   Yes Historical Provider, MD   amLODipine (NORVASC) 10 MG tablet Take 1 tablet (10 mg total) by mouth daily with breakfast. 02/10/13  Yes Claudie Revering, NP  calcitRIOL (ROCALTROL) 0.5 MCG capsule Take 1 capsule (0.5 mcg total) by mouth daily with breakfast. 02/10/13  Yes Claudie Revering, NP  fentaNYL (DURAGESIC - DOSED MCG/HR) 12 MCG/HR Place 12.5 mcg onto the skin every 3 (three) days. Change patches every 3-4 days Wear both 12 mcg and 50 mcg patch at the same time   Yes Historical Provider, MD  fentaNYL (DURAGESIC - DOSED MCG/HR) 50 MCG/HR Place 50 mcg onto the skin See admin instructions. Change patches every 3-4 days Wear both 12 mcg and 50 mcg patch at the same time   Yes Historical Provider, MD  furosemide (LASIX) 40 MG tablet Take 80 mg by mouth 2 (two) times daily.   Yes Historical Provider, MD  gabapentin (NEURONTIN) 300 MG capsule Take 600 mg by mouth 2 (two) times daily.    Yes Historical Provider, MD  insulin detemir (LEVEMIR) 100 UNIT/ML injection Inject 18 Units into the skin every morning.    Yes Historical Provider, MD  insulin lispro (HUMALOG) 100 UNIT/ML injection Inject 0-4 Units into the skin 3 (three) times daily before meals. Per sliding scale   Yes Historical Provider, MD  labetalol (NORMODYNE) 200 MG tablet Take 1.5 tablets (300  mg total) by mouth 3 (three) times daily. Takes 1 and 1/2 tabs 02/10/13  Yes Claudie Revering, NP  latanoprost (XALATAN) 0.005 % ophthalmic solution Place 1 drop into both eyes at bedtime.   Yes Historical Provider, MD  levothyroxine (SYNTHROID, LEVOTHROID) 112 MCG tablet Take 2 tablets (224 mcg total) by mouth daily with breakfast. 02/10/13  Yes Claudie Revering, NP  mycophenolate (CELLCEPT) 250 MG capsule Take 2 capsules (500 mg total) by mouth 2 (two) times daily. 02/10/13  Yes Claudie Revering, NP  potassium chloride SA (K-DUR,KLOR-CON) 20 MEQ tablet Take 1 tablet (20 mEq total) by mouth daily. 05/24/13  Yes Julian Hy, MD  predniSONE (DELTASONE) 10 MG tablet Take 10 mg by  mouth daily with breakfast.   Yes Historical Provider, MD  ranitidine (ZANTAC) 150 MG tablet Take 1 tablet (150 mg total) by mouth daily. 02/10/13  Yes Claudie Revering, NP  sirolimus (RAPAMUNE) 1 MG tablet Take 3 tablets (3 mg total) by mouth daily. Pt takes three 1 mg tablets = 3 mg daily 02/10/13  Yes Claudie Revering, NP    Allergies:   Allergies  Allergen Reactions  . Adhesive [Tape]     Skin irritation, "pulls the skin off"   . Aspirin     Crawling feeling under skin  . Hydrocodone Other (See Comments)    Hallucinations   . Iohexol      Desc: pt unable to tell what the reaction was   . Oxycodone     hallucinations  . Penicillins Hives and Swelling    Has tolerated cefazolin and ceftriaxone.  . Tacrolimus Other (See Comments)    Hallucinations   . Contrast Media [Iodinated Diagnostic Agents] Hives, Swelling and Rash    Social History:  reports that she quit smoking about 34 years ago. She has never used smokeless tobacco. She reports that she does not drink alcohol or use illicit drugs.  Family History: Family History  Problem Relation Age of Onset  . Anesthesia problems Neg Hx     Physical Exam: Filed Vitals:   09/30/13 1315 09/30/13 1400 09/30/13 1500 09/30/13 1542  BP: 116/47 103/52 119/50 108/49  Pulse: 77 76 74 67  Temp:    98.8 F (37.1 C)  TempSrc:    Oral  Resp: 16 18 14 18   Height:    5\' 3"  (1.6 m)  Weight:    63.6 kg (140 lb 3.4 oz)  SpO2: 99% 100% 100% 95%   General appearance: frail BF with O2 in place. Left perirorbital swelling, anicteric   Neck:   no carotid bruit, no JVD and thyroid not enlarged, symmetric, no tenderness/mass/nodules Resp: a bit diminished, no wheeze, no accessory ms in use Cardio: regular distant GI: well healed scars soft, non-tender; bowel sounds normal; no masses,  no organomegaly Extremities: some wasting, diminished pulses, no edema Lymph nodes: Cervical adenopathy: no cervical lymphadenopathy Neurologic: will open  eyes but will not speech, no tremor at rest  Labs on Admission:   Recent Labs  09/30/13 1059  NA 143  K 3.8  CL 98  CO2 31  GLUCOSE 298*  BUN 43*  CREATININE 1.92*  CALCIUM 9.3    Recent Labs  09/30/13 1059  AST 9  ALT <5  ALKPHOS 55  BILITOT 0.7  PROT 6.5  ALBUMIN 3.2*     Recent Labs  09/30/13 1059  WBC 12.8*  NEUTROABS 11.1*  HGB 12.6  HCT 38.8  MCV 86.8  PLT 204  Results for DALLIS, DARDEN (MRN [id number]) as of 09/30/2013 17:27  Ref. Range 09/30/2013 11:30  Color, Urine Latest Range: YELLOW  YELLOW  APPearance Latest Range: CLEAR  CLOUDY (A)  Specific Gravity, Urine Latest Range: 1.005-1.030  1.016  pH Latest Range: 5.0-8.0  5.0  Glucose Latest Range: NEGATIVE mg/dL 409 (A)  Bilirubin Urine Latest Range: NEGATIVE  NEGATIVE  Ketones, ur Latest Range: NEGATIVE mg/dL NEGATIVE  Protein Latest Range: NEGATIVE mg/dL 811 (A)  Urobilinogen, UA Latest Range: 0.0-1.0 mg/dL 0.2  Nitrite Latest Range: NEGATIVE  NEGATIVE  Leukocytes, UA Latest Range: NEGATIVE  LARGE (A)  Hgb urine dipstick Latest Range: NEGATIVE  LARGE (A)  WBC, UA Latest Range: <3 WBC/hpf 21-50  RBC / HPF Latest Range: <3 RBC/hpf 3-6  Bacteria, UA Latest Range: RARE  MANY (A)       Radiological Exams on Admission: Dg Chest Portable 1 View  09/30/2013   CLINICAL DATA:  Confusion, possible aspiration.  EXAM: PORTABLE CHEST - 1 VIEW  COMPARISON:  August 10, 2013  FINDINGS: The heart size and mediastinal contours are stable. Overlying artifact is noted over the bilateral upper lungs. Both lungs are clear. The visualized skeletal structures are stable.  IMPRESSION: No active cardiopulmonary disease.   Electronically Signed   By: Sherian Rein M.D.   On: 09/30/2013 11:29     Assessment/Plan Active Problems:   Urinary tract infection: recurrent. Has gotten rocephin and vanc. Will continue. Not septic DM 2: BS's up, Rx lantus and SS RENAL TRANSPLANT/AKI: a little worse than her baseline.  Continue Rx PROGRESSIVE DEMENTIA: would do better in SNF HYPERTENSION: BP OK PANCYTOPENIA: improved PROTEIN CALORIE MALNUTRITION: progressive HYPOTHYROID: On Rx GOUT: on Rx HERPES EYE INFX: stable CHRONIC BACK PAIN: on Rx. May need to reduce again   Julian Hy 09/30/2013, 5:23 PM

## 2013-10-01 LAB — URINE CULTURE: Colony Count: >=100000

## 2013-10-01 LAB — GLUCOSE, CAPILLARY
Glucose-Capillary: 107 mg/dL — ABNORMAL HIGH (ref 70–99)
Glucose-Capillary: 105 mg/dL — ABNORMAL HIGH (ref 70–99)
Glucose-Capillary: 206 mg/dL — ABNORMAL HIGH (ref 70–99)

## 2013-10-01 MED ORDER — DEXTROSE 5 % IV SOLN
1.0000 g | INTRAVENOUS | Status: DC
  Administered 2013-10-01 – 2013-10-07 (×7): 1 g via INTRAVENOUS
  Filled 2013-10-01 (×7): qty 1

## 2013-10-01 MED ORDER — INSULIN GLARGINE 100 UNIT/ML ~~LOC~~ SOLN
16.0000 [IU] | Freq: Every day | SUBCUTANEOUS | Status: DC
  Administered 2013-10-01: 22:00:00 16 [IU] via SUBCUTANEOUS
  Filled 2013-10-01 (×2): qty 0.16

## 2013-10-01 MED ORDER — DEXTROSE-NACL 5-0.9 % IV SOLN
INTRAVENOUS | Status: DC
  Administered 2013-10-01 – 2013-10-03 (×3): via INTRAVENOUS

## 2013-10-01 NOTE — Progress Notes (Signed)
Subjective: Won't wake up much this AM. Took a little water. Not able to get AM pills yet   Objective: Vital signs in last 24 hours: Temp:  [98.6 F (37 C)-101.5 F (38.6 C)] 101.2 F (38.4 C) (12/12 0613) Pulse Rate:  [67-94] 85 (12/12 0613) Resp:  [14-21] 18 (12/12 0613) BP: (103-153)/(45-88) 152/88 mmHg (12/12 0613) SpO2:  [89 %-100 %] 99 % (12/12 0613) Weight:  [63.6 kg (140 lb 3.4 oz)] 63.6 kg (140 lb 3.4 oz) (12/11 1542)  Intake/Output from previous day:   Intake/Output this shift:    Frail in no distress. Some facial tremor. Lungs clear. Ht regular abd non distended No edema. Somnolent, min responsive  Lab Results   Recent Labs  09/30/13 1059  WBC 12.8*  RBC 4.47  HGB 12.6  HCT 38.8  MCV 86.8  MCH 28.2  RDW 17.1*  PLT 204    Recent Labs  09/30/13 1059  NA 143  K 3.8  CL 98  CO2 31  GLUCOSE 298*  BUN 43*  CREATININE 1.92*  CALCIUM 9.3   09/30/13 1110  BLOOD CULTURE RECEIVED NO GROWTH TO DATE CULTURE WILL BE HELD FOR 5 DAYS BEFORE ISSUING A FINAL NEGATIVE REPORT Performed at [facility]  Details 09/30/13 1100  GRAM NEGATIVE RODS 0452 Note: Gram Stain Report Called to,Read Back By and Verified With: Cherlynn Kaiser 10/01/13 [facility] Performed at [facility]    Studies/Results: Dg Chest Portable 1 View  09/30/2013   CLINICAL DATA:  Confusion, possible aspiration.  EXAM: PORTABLE CHEST - 1 VIEW  COMPARISON:  August 10, 2013  FINDINGS: The heart size and mediastinal contours are stable. Overlying artifact is noted over the bilateral upper lungs. Both lungs are clear. The visualized skeletal structures are stable.  IMPRESSION: No active cardiopulmonary disease.   Electronically Signed   By: Sherian Rein M.D.   On: 09/30/2013 11:29    Scheduled Meds: . allopurinol  150 mg Oral Daily  . amLODipine  10 mg Oral Q breakfast  . calcitRIOL  0.5 mcg Oral Q breakfast  . ceFEPime (MAXIPIME) IV  1 g Intravenous Q24H  . enoxaparin (LOVENOX)  injection  30 mg Subcutaneous Q24H  . famotidine  20 mg Oral Daily  . fentaNYL  25 mcg Transdermal See admin instructions  . gabapentin  600 mg Oral BID  . insulin aspart  0-5 Units Subcutaneous QHS  . insulin aspart  0-9 Units Subcutaneous TID WC  . insulin glargine  20 Units Subcutaneous QHS  . labetalol  300 mg Oral TID  . latanoprost  1 drop Both Eyes QHS  . levothyroxine  224 mcg Oral QAC breakfast  . mycophenolate  500 mg Oral BID  . potassium chloride SA  20 mEq Oral Daily  . predniSONE  10 mg Oral Q breakfast  . sirolimus  3 mg Oral Daily  . vancomycin  750 mg Intravenous Q24H   Continuous Infusions: . sodium chloride 75 mL/hr at 09/30/13 2155  . dextrose 5 % and 0.9% NaCl     PRN Meds:  Assessment/Plan: GNR BACTEREMIA: now on maxipime per pharmacy. BP OK  Urinary tract infection: recurrent. As above  DM 2: 107 this AM, add a little D5NS  RENAL TRANSPLANT/AKI: a little worse than her baseline. Continue Rx  PROGRESSIVE DEMENTIA: would do better in SNF  HYPERTENSION: BP OK  PANCYTOPENIA: improved  PROTEIN CALORIE MALNUTRITION: progressive  HYPOTHYROID: On Rx  GOUT: on Rx  HERPES EYE INFX: stable  CHRONIC  BACK PAIN: now on 25    LOS: 1 day   Allisson Schindel [name] 10/01/2013, 9:32 AM

## 2013-10-01 NOTE — Progress Notes (Signed)
CRITICAL VALUE ALERT  Critical value received:  Aerobic bottle had  Gram negative rods  Date of notification:  10/01/2013  Time of notification:  0456  Critical value read back:yes  Nurse who received alert:   Judy Pimple  MD notified (1st page):  [place]  Time of first page:  0458  MD notified (2nd page):  Time of second page:  Responding MD: Caprice Kluver  Time MD responded:  [phone]

## 2013-10-01 NOTE — Evaluation (Signed)
Clinical/Bedside Swallow Evaluation Patient Details  Name: [name] MRN: [id number] Date of Birth: October 11, 1941  Today's Date: 10/01/2013 Time: 1000-1030 SLP Time Calculation (min): 30 min  Past Medical History:  Past Medical History  Diagnosis Date  . Diabetes mellitus   . Diabetic neuropathy   . Hypothyroidism   . Hypertension   . GERD (gastroesophageal reflux disease)   . Heart murmur     not treated for  . Arthritis   . Chronic kidney disease     RENAL TRANSPLANT IN 2008--PT IS FOLLOWED BY DR. Karie Fetch WITH BUN OF 37 AND CREAT 1.7--PER NOTE DR. NESI FROM 04/11/12  . Urethral diverticulum   . Eyesight diminished     GLAUCOMA   . History of shingles     LEFT EYE JAN 2012--STILL HAS EYE PAIN-STATES NOT ABLE TO TAKE THE MEDICATION FOR SHINGLES BECAUSE OF HER HX OF KIDNEY TRANSPLANT  . Fingernail abnormalities     FUNGUS OF FINGERNAILS  . UTI (urinary tract infection) 05/18/2013  . UTI (lower urinary tract infection)    Past Surgical History:  Past Surgical History  Procedure Laterality Date  . Kidney transplant  2008    At [facility]  . Back surgery      ruptured disk  . Abdominal hysterectomy    . Appendectomy    . Laser surgery of both eyes for hemorrhages    . 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 Urethral diverticulectomy  04/28/2012    Procedure: URETHRAL DIVERTICULECTOMY;  Surgeon: Antony Haste, MD;  Location: [facility];  Service: Urology;  Laterality: N/A;   HPI:  72 YO BF with DM2, renal transplant, dementia and recurrent UTI's . Presents again with altered mental status with difficulty taking meds and eating. Some cough. No chills or sweats. Bowels OK. Some eye irritation.  Pt followed by SLP services during September 2014 admission.  At that time, MS changes inhibited safe eating.  Pt ultimately underwent MBS study on 07/01/13, which revealed grossly normal swallow with no observed aspiration but oral deficits relative to dementia.      Assessment / Plan / Recommendation Clinical Impression  Pt presents with dysphagia secondary to changes in MS, responsiveness.  Insufficient LOA, attention inhibit ability to anticipate bolus, masticate, and prepare for airway closure, all common in dementia.  Pt consumed meds crushed in puree with physical assist required to maintain lip seal; verbal and tactile cues to initiate a swallow response.  Further purees required oral suctioning for removal.  S/s of aspiration with liquids were observed; pt held liquids orally with poor initiation.    Recommend continuing NPO except meds crushed in puree.  Should MS improve to baseline, we may see concomitant improvement in swallow function.       Aspiration Risk  Severe    Diet Recommendation NPO except meds   Medication Administration: Crushed with puree Postural Changes and/or Swallow Maneuvers: Seated upright 90 degrees    Other  Recommendations Oral Care Recommendations: Oral care Q4 per protocol   Follow Up Recommendations   (tba)    Frequency and Duration min 2x/week  2 weeks   Pertinent Vitals/Pain No c/o pain; pt not observed to be in pain    SLP Swallow Goals   See care plan  Swallow Study Prior Functional Status       General HPI: 72 YO BF with DM2, renal transplant, dementia and recurrent UTI's . Presents again with altered mental status with difficulty taking meds and  eating. Some cough. No chills or sweats. Bowels OK. Some eye irritation.  Pt followed by SLP services during September 2014 admission.  At that time, MS changes inhibited safe eating.  Pt ultimately underwent MBS study on 07/01/13, which revealed grossly normal swallow with no observed aspiration and oral deficits relative to dementia.   Type of Study: Bedside swallow evaluation Previous Swallow Assessment: 07/01/13 Diet Prior to this Study: NPO Temperature Spikes Noted: No Respiratory Status: Room air History of Recent Intubation: No Oral Cavity -  Dentition: Edentulous Self-Feeding Abilities: Total assist Patient Positioning: Upright in bed Baseline Vocal Quality:  (nonverbal) Volitional Cough: Cognitively unable to elicit Volitional Swallow: Unable to elicit    Oral/Motor/Sensory Function Overall Oral Motor/Sensory Function: Appears within functional limits for tasks assessed   Ice Chips Ice chips: Impaired Presentation: Spoon Oral Phase Impairments: Reduced labial seal;Reduced lingual movement/coordination;Impaired anterior to posterior transit;Poor awareness of bolus Oral Phase Functional Implications: Prolonged oral transit;Oral residue;Oral holding Pharyngeal Phase Impairments: Suspected delayed Swallow;Decreased hyoid-laryngeal movement   Thin Liquid Thin Liquid: Impaired Presentation: Spoon Oral Phase Impairments: Reduced labial seal;Reduced lingual movement/coordination;Poor awareness of bolus Oral Phase Functional Implications: Oral residue;Prolonged oral transit;Oral holding Pharyngeal  Phase Impairments: Suspected delayed Swallow;Decreased hyoid-laryngeal movement;Multiple swallows;Cough - Delayed    Nectar Thick Nectar Thick Liquid: Not tested   Honey Thick Honey Thick Liquid: Not tested   Puree Puree: Impaired Oral Phase Impairments: Reduced labial seal;Reduced lingual movement/coordination;Poor awareness of bolus Oral Phase Functional Implications: Oral residue;Oral holding;Prolonged oral transit Pharyngeal Phase Impairments: Suspected delayed Swallow;Decreased hyoid-laryngeal movement;Multiple swallows   Solid  Vincient Vanaman L. [place], [place] CCC/SLP Pager [phone]     Solid: Not tested       Blenda Mounts Laurice 10/01/2013,10:47 AM

## 2013-10-01 NOTE — Progress Notes (Signed)
INITIAL NUTRITION ASSESSMENT  DOCUMENTATION CODES Per approved criteria  -Severe malnutrition in the context of chronic illness   INTERVENTION: Recommend oral nutrition supplements if/when diet is advanced. If aggressive nutrition interventions desired, recommend consult RD for enteral nutrition recommendations. RD to continue to follow nutrition care plan.  NUTRITION DIAGNOSIS: Inadequate oral intake related to AMS as evidenced by severe muscle mass loss and H&P.   Goal: Diet advancement as tolerated. Intake to meet >90% of estimated nutrition needs.  Monitor:  weight trends, lab trends, I/O's, PO intake, supplement tolerance  Reason for Assessment: Malnutrition Screening Tool  72 y.o. female  Admitting Dx: confusion  ASSESSMENT: PMHx significant for DM2, renal transplant, dementia and recurrent UTI's. Admitted with AMS and difficulty taking POs. Per MD, pt has FTT and fairly rapidly progressive mental decline.   SLP completed BSE on RD arrival. Recommendations are continued NPO except meds crushed in purees.  Pt is sleeping. No family at bedside.   Nutrition Focused Physical Exam:  Subcutaneous Fat:  Orbital Region: moderate depletion Upper Arm Region: n/a Thoracic and Lumbar Region: n/a  Muscle:  Temple Region: moderate depletion Clavicle Bone Region: severe depletion Clavicle and Acromion Bone Region: n/a Scapular Bone Region: n/a Dorsal Hand: n/a Patellar Region: severe depletion Anterior Thigh Region: severe depletion Posterior Calf Region: severe depletion  Edema: none  Pt meets criteria for severe MALNUTRITION in the context of chronic illness as evidenced by severe muscle mass loss and suspected intake of <75% of estimated energy intake x a at least 1 month.  Pt was also dx with severe malnutrition by RD in October of this year.   Height: Ht Readings from Last 1 Encounters:  09/30/13 5\' 3"  (1.6 m)    Weight: Wt Readings from Last 1 Encounters:   09/30/13 140 lb 3.4 oz (63.6 kg)    Ideal Body Weight: 115 lb  % Ideal Body Weight: 122%  Wt Readings from Last 25 Encounters:  09/30/13 140 lb 3.4 oz (63.6 kg)  08/16/13 140 lb 3.2 oz (63.594 kg)  07/04/13 139 lb 1.8 oz (63.1 kg)  05/18/13 140 lb (63.504 kg)  01/15/13 149 lb (67.586 kg)  01/07/13 153 lb 6.4 oz (69.582 kg)  08/22/12 150 lb (68.04 kg)  06/04/12 155 lb 11.2 oz (70.625 kg)  04/30/12 198 lb 10.2 oz (90.1 kg)  04/28/12 198 lb 10.2 oz (90.1 kg)  04/28/12 198 lb 10.2 oz (90.1 kg)  04/16/12 162 lb (73.483 kg)  03/11/12 162 lb 11.2 oz (73.8 kg)  01/15/12 160 lb (72.576 kg)    sual Body Weight: 160 lb  % Usual Body Weight: 88%  BMI:  Body mass index is 24.84 kg/(m^2). Normal weight  Estimated Nutritional Needs: Kcal: 1500 - 1700 Protein: 51 - 64 g Fluid: 1.5 - 1.7 liters  Skin: intact  Diet Order: NPO  EDUCATION NEEDS: -No education needs identified at this time  No intake or output data in the 24 hours ending 10/01/13 1020  Last BM: PTA  Labs:   Recent Labs Lab 09/30/13 1059  NA 143  K 3.8  CL 98  CO2 31  BUN 43*  CREATININE 1.92*  CALCIUM 9.3  GLUCOSE 298*    CBG (last 3)   Recent Labs  09/30/13 1547 09/30/13 2144 10/01/13 0754  GLUCAP 335* 230* 107*    Scheduled Meds: . allopurinol  150 mg Oral Daily  . amLODipine  10 mg Oral Q breakfast  . calcitRIOL  0.5 mcg Oral Q breakfast  .  ceFEPime (MAXIPIME) IV  1 g Intravenous Q24H  . enoxaparin (LOVENOX) injection  30 mg Subcutaneous Q24H  . famotidine  20 mg Oral Daily  . fentaNYL  25 mcg Transdermal See admin instructions  . gabapentin  600 mg Oral BID  . insulin aspart  0-5 Units Subcutaneous QHS  . insulin aspart  0-9 Units Subcutaneous TID WC  . insulin glargine  16 Units Subcutaneous QHS  . labetalol  300 mg Oral TID  . latanoprost  1 drop Both Eyes QHS  . levothyroxine  224 mcg Oral QAC breakfast  . mycophenolate  500 mg Oral BID  . potassium chloride SA  20 mEq Oral  Daily  . predniSONE  10 mg Oral Q breakfast  . sirolimus  3 mg Oral Daily  . vancomycin  750 mg Intravenous Q24H    Continuous Infusions: . sodium chloride 75 mL/hr at 09/30/13 2155  . dextrose 5 % and 0.9% NaCl 50 mL/hr at 10/01/13 1015    Past Medical History  Diagnosis Date  . Diabetes mellitus   . Diabetic neuropathy   . Hypothyroidism   . Hypertension   . GERD (gastroesophageal reflux disease)   . Heart murmur     not treated for  . Arthritis   . Chronic kidney disease     RENAL TRANSPLANT IN 2008--PT IS FOLLOWED BY DR. Karie Fetch WITH BUN OF 37 AND CREAT 1.7--PER NOTE DR. NESI FROM 04/11/12  . Urethral diverticulum   . Eyesight diminished     GLAUCOMA   . History of shingles     LEFT EYE JAN 2012--STILL HAS EYE PAIN-STATES NOT ABLE TO TAKE THE MEDICATION FOR SHINGLES BECAUSE OF HER HX OF KIDNEY TRANSPLANT  . Fingernail abnormalities     FUNGUS OF FINGERNAILS  . UTI (urinary tract infection) 05/18/2013  . UTI (lower urinary tract infection)     Past Surgical History  Procedure Laterality Date  . Kidney transplant  2008    At [facility]  . Back surgery      ruptured disk  . Abdominal hysterectomy    . Appendectomy    . Laser surgery of both eyes for hemorrhages    . 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 Urethral diverticulectomy  04/28/2012    Procedure: URETHRAL DIVERTICULECTOMY;  Surgeon: Antony Haste, MD;  Location: [facility];  Service: Urology;  Laterality: N/A;    Jarold Motto MS, RD, LDN Pager: [phone] After-hours pager: [phone]

## 2013-10-02 LAB — GLUCOSE, CAPILLARY: Glucose-Capillary: 120 mg/dL — ABNORMAL HIGH (ref 70–99)

## 2013-10-02 LAB — BASIC METABOLIC PANEL WITH GFR
BUN: 37 mg/dL — ABNORMAL HIGH (ref 6–23)
CO2: 33 meq/L — ABNORMAL HIGH (ref 19–32)
Calcium: 8.6 mg/dL (ref 8.4–10.5)
Chloride: 112 meq/L (ref 96–112)
Creatinine, Ser: 1.71 mg/dL — ABNORMAL HIGH (ref 0.50–1.10)
GFR calc Af Amer: 33 mL/min — ABNORMAL LOW
GFR calc non Af Amer: 28 mL/min — ABNORMAL LOW
Glucose, Bld: 95 mg/dL (ref 70–99)
Potassium: 3.6 meq/L (ref 3.5–5.1)
Sodium: 152 meq/L — ABNORMAL HIGH (ref 135–145)

## 2013-10-02 LAB — CBC
HCT: 37.1 % (ref 36.0–46.0)
Hemoglobin: 11.5 g/dL — ABNORMAL LOW (ref 12.0–15.0)
MCH: 27.5 pg (ref 26.0–34.0)
MCHC: 31 g/dL (ref 30.0–36.0)
MCV: 88.8 fL (ref 78.0–100.0)
Platelets: 183 K/uL (ref 150–400)
RBC: 4.18 MIL/uL (ref 3.87–5.11)
RDW: 17.6 % — ABNORMAL HIGH (ref 11.5–15.5)
WBC: 7.9 K/uL (ref 4.0–10.5)

## 2013-10-02 MED ORDER — DEXTROSE 50 % IV SOLN
INTRAVENOUS | Status: AC
  Administered 2013-10-02: 50 mL
  Filled 2013-10-02: qty 50

## 2013-10-02 MED ORDER — INSULIN GLARGINE 100 UNIT/ML ~~LOC~~ SOLN
10.0000 [IU] | Freq: Every day | SUBCUTANEOUS | Status: DC
  Administered 2013-10-02 – 2013-10-03 (×2): 10 [IU] via SUBCUTANEOUS
  Filled 2013-10-02 (×3): qty 0.1

## 2013-10-02 NOTE — Progress Notes (Signed)
10/02/13 Patient at risk for aspiration due to her cognition. Discussion with husband about feeding her regular diet. He refuses to follow doctor's orders not to feed her when she is not responding to verbal stimuli. Speech did another swallow study which she would not swallow any applesauce or drink. Husband won't listen to what staff is instructing him to not feed her when she is not responding.

## 2013-10-02 NOTE — Progress Notes (Signed)
Subjective: More alert, her husband was feeding her normal food earlier today without her coughing, despite nurses indicating that she is at risk for pneumonia and death with feeding by mouth.  Objective: Vital signs in last 24 hours: Temp:  [98.2 F (36.8 C)-99.2 F (37.3 C)] 98.4 F (36.9 C) (12/13 0559) Pulse Rate:  [65-78] 68 (12/13 0559) Resp:  [18] 18 (12/13 0559) BP: (138-148)/(60-63) 148/62 mmHg (12/13 0559) SpO2:  [92 %-98 %] 97 % (12/13 0559) Weight change:    Intake/Output from previous day: 12/12 0701 - 12/13 0700 In: 1629.6 [I.V.:1429.6; IV Piggyback:200] Out: -    General appearance: alert and no distress Resp: clear to auscultation bilaterally Cardio: regular rate and rhythm and with a 2/6 SEM GI: soft, non-tender; bowel sounds normal; no masses,  no organomegaly she was alert and oriented x 0  Lab Results:  Recent Labs  09/30/13 1059 10/02/13 0445  WBC 12.8* 7.9  HGB 12.6 11.5*  HCT 38.8 37.1  PLT 204 183   BMET  Recent Labs  09/30/13 1059 10/02/13 0445  NA 143 152*  K 3.8 3.6  CL 98 112  CO2 31 33*  GLUCOSE 298* 95  BUN 43* 37*  CREATININE 1.92* 1.71*  CALCIUM 9.3 8.6   CMET CMP     Component Value Date/Time   NA 152* 10/02/2013 0445   K 3.6 10/02/2013 0445   CL 112 10/02/2013 0445   CO2 33* 10/02/2013 0445   GLUCOSE 95 10/02/2013 0445   BUN 37* 10/02/2013 0445   CREATININE 1.71* 10/02/2013 0445   CALCIUM 8.6 10/02/2013 0445   CALCIUM 9.9 01/26/2013 0949   PROT 6.5 09/30/2013 1059   ALBUMIN 3.2* 09/30/2013 1059   AST 9 09/30/2013 1059   ALT <5 09/30/2013 1059   ALKPHOS 55 09/30/2013 1059   BILITOT 0.7 09/30/2013 1059   GFRNONAA 28* 10/02/2013 0445   GFRAA 33* 10/02/2013 0445    CBG (last 3)   Recent Labs  10/02/13 0750 10/02/13 0824 10/02/13 0918  GLUCAP 50* 53* 120*    INR RESULTS:   Lab Results  Component Value Date   INR 1.0 02/19/2009   INR 0.9 04/02/2007     Studies/Results: Dg Chest Portable 1  View  09/30/2013   CLINICAL DATA:  Confusion, possible aspiration.  EXAM: PORTABLE CHEST - 1 VIEW  COMPARISON:  August 10, 2013  FINDINGS: The heart size and mediastinal contours are stable. Overlying artifact is noted over the bilateral upper lungs. Both lungs are clear. The visualized skeletal structures are stable.  IMPRESSION: No active cardiopulmonary disease.   Electronically Signed   By: Sherian Rein M.D.   On: 09/30/2013 11:29    Medications: I have reviewed the patient's current medications.  Assessment/Plan: #1 UTI: stable on antibiotics. #2 DM2: having mild to moderate hypoglycemia due to reduced food intake, so will adjust insulin rx #3 Protein calorie malnutrition: severe and worsening, and I explained the risks of feeding her by mouth to her husband who understands and would like to sign a waiver to allow her to have some food. #4 Dysphagia: severe and due to advanced Alzheimer's disease. She is at end of life and may benefit from a hospice and palliative care consult to help the family to better appreciate her condition.   LOS: 2 days   Marquest Gunkel G 10/02/2013, 10:38 AM

## 2013-10-02 NOTE — Progress Notes (Signed)
Speech Language Pathology Treatment: Dysphagia  Patient Details Name: [name] MRN: [id number] DOB: July 27, 1941 Today's Date: 10/02/2013 Time: 4098-1191 SLP Time Calculation (min): 19 min  Assessment / Plan / Recommendation Clinical Impression  Pt tx at bedside with family in room. Pt initially with increased alertness, responding to clinician when repositioned for appropriate posture in bed. Provided pt with trials of small ice chips x2, on 2nd trial pt with delayed throat clear after swallow. Attempted to provided trials of applesauce, however pt refused pos, if placed on pt's tongue she would then expectorate small bolus from oral cavity. Discussed progress with RN, RN reports pt with increased tolerance for consuming medication this AM. Per chart, family members providing pt with regular solids, MD reviewed risk of aspiration. Given pt's inconsistent alertness, recommend pt to remain NPO with exception of medications at this time given high risk of aspiration with lethargy.    HPI HPI: 72 YO BF with DM2, renal transplant, dementia and recurrent UTI's . Presents again with altered mental status with difficulty taking meds and eating. Some cough. No chills or sweats. Bowels OK. Some eye irritation.  Pt followed by SLP services during September 2014 admission.  At that time, MS changes inhibited safe eating.  Pt ultimately underwent MBS study on 07/01/13, which revealed grossly normal swallow with no observed aspiration and oral deficits relative to dementia.     Pertinent Vitals No complaints  SLP Plan  Continue with current plan of care    Recommendations Diet recommendations: NPO Medication Administration: Crushed with puree Postural Changes and/or Swallow Maneuvers: Seated upright 90 degrees              Oral Care Recommendations: Oral care Q4 per protocol Plan: Continue with current plan of care    GO     Chyrel Masson 10/02/2013, 5:51 PM

## 2013-10-03 LAB — BASIC METABOLIC PANEL
Calcium: 8.3 mg/dL — ABNORMAL LOW (ref 8.4–10.5)
Creatinine, Ser: 1.72 mg/dL — ABNORMAL HIGH (ref 0.50–1.10)
GFR calc Af Amer: 33 mL/min — ABNORMAL LOW (ref >90)
GFR calc non Af Amer: 28 mL/min — ABNORMAL LOW (ref >90)
Glucose, Bld: 242 mg/dL — ABNORMAL HIGH (ref 70–99)

## 2013-10-03 LAB — GLUCOSE, CAPILLARY
Glucose-Capillary: 151 mg/dL — ABNORMAL HIGH (ref 70–99)
Glucose-Capillary: 245 mg/dL — ABNORMAL HIGH (ref 70–99)
Glucose-Capillary: 162 mg/dL — ABNORMAL HIGH (ref 70–99)
Glucose-Capillary: 237 mg/dL — ABNORMAL HIGH (ref 70–99)

## 2013-10-03 LAB — CBC
Platelets: 182 10*3/uL (ref 150–400)
RDW: 17.5 % — ABNORMAL HIGH (ref 11.5–15.5)
WBC: 5.2 10*3/uL (ref 4.0–10.5)

## 2013-10-03 LAB — CULTURE, BLOOD (ROUTINE X 2)

## 2013-10-03 NOTE — Progress Notes (Signed)
Subjective: Sleepy this morning and does not awaken with voice, appears comfortable. Discussed swallow study with husband who feels that she can swallow safely when she is awake and he understands the risk of pneumonia and death if she is not able to safely swallow. Despite these risks he would like to continue to try to feed her.  Objective: Vital signs in last 24 hours: Temp:  [98.6 F (37 C)-99.6 F (37.6 C)] 98.7 F (37.1 C) (12/14 0539) Pulse Rate:  [72-78] 78 (12/14 0539) Resp:  [18] 18 (12/14 0539) BP: (104-147)/(43-74) 147/62 mmHg (12/14 0539) SpO2:  [93 %-96 %] 96 % (12/14 0539) Weight change:    Intake/Output from previous day: 12/13 0701 - 12/14 0700 In: 1401.7 [P.O.:50; I.V.:1151.7; IV Piggyback:200] Out: -    General appearance: no distress Resp: clear to auscultation bilaterally Cardio: regular rate and rhythm GI: soft, non-tender; bowel sounds normal; no masses,  no organomegaly  Lab Results:  Recent Labs  10/02/13 0445 10/03/13 0420  WBC 7.9 5.2  HGB 11.5* 11.1*  HCT 37.1 36.0  PLT 183 182   BMET  Recent Labs  10/02/13 0445 10/03/13 0420  NA 152* 147*  K 3.6 3.6  CL 112 109  CO2 33* 30  GLUCOSE 95 242*  BUN 37* 36*  CREATININE 1.71* 1.72*  CALCIUM 8.6 8.3*   CMET CMP     Component Value Date/Time   NA 147* 10/03/2013 0420   K 3.6 10/03/2013 0420   CL 109 10/03/2013 0420   CO2 30 10/03/2013 0420   GLUCOSE 242* 10/03/2013 0420   BUN 36* 10/03/2013 0420   CREATININE 1.72* 10/03/2013 0420   CALCIUM 8.3* 10/03/2013 0420   CALCIUM 9.9 01/26/2013 0949   PROT 6.5 09/30/2013 1059   ALBUMIN 3.2* 09/30/2013 1059   AST 9 09/30/2013 1059   ALT <5 09/30/2013 1059   ALKPHOS 55 09/30/2013 1059   BILITOT 0.7 09/30/2013 1059   GFRNONAA 28* 10/03/2013 0420   GFRAA 33* 10/03/2013 0420    CBG (last 3)   Recent Labs  10/02/13 0750 10/02/13 0824 10/02/13 0918  GLUCAP 50* 53* 120*    INR RESULTS:   Lab Results  Component Value Date   INR  1.0 02/19/2009   INR 0.9 04/02/2007     Studies/Results: No results found.  Medications: I have reviewed the patient's current medications.  Assessment/Plan: #1 UTI: stable on antibiotics #2 DM2: variable sugars due to variable po intake #3 Dysphagia: appears to be due to end stage Alzheimers disease which family declines to accept. Perhaps hospice or palliative care RN evaluation may be helpful in this regard. Will defer this to PCP.  LOS: 3 days   [name] G 10/03/2013, 10:19 AM

## 2013-10-03 NOTE — Progress Notes (Signed)
10/03/13 Patient dislodged iv site ,call placed to IV team to assess.

## 2013-10-04 LAB — GLUCOSE, CAPILLARY
Glucose-Capillary: 145 mg/dL — ABNORMAL HIGH (ref 70–99)
Glucose-Capillary: 267 mg/dL — ABNORMAL HIGH (ref 70–99)
Glucose-Capillary: 324 mg/dL — ABNORMAL HIGH (ref 70–99)
Glucose-Capillary: 251 mg/dL — ABNORMAL HIGH (ref 70–99)

## 2013-10-04 LAB — BASIC METABOLIC PANEL
BUN: 31 mg/dL — ABNORMAL HIGH (ref 6–23)
Chloride: 109 mEq/L (ref 96–112)
Creatinine, Ser: 1.48 mg/dL — ABNORMAL HIGH (ref 0.50–1.10)
GFR calc Af Amer: 39 mL/min — ABNORMAL LOW (ref >90)
Sodium: 146 mEq/L — ABNORMAL HIGH (ref 135–145)

## 2013-10-04 LAB — CBC
HCT: 34.4 % — ABNORMAL LOW (ref 36.0–46.0)
Hemoglobin: 10.8 g/dL — ABNORMAL LOW (ref 12.0–15.0)
MCHC: 31.4 g/dL (ref 30.0–36.0)
MCV: 87.1 fL (ref 78.0–100.0)
RBC: 3.95 MIL/uL (ref 3.87–5.11)
RDW: 17.2 % — ABNORMAL HIGH (ref 11.5–15.5)

## 2013-10-04 MED ORDER — INSULIN GLARGINE 100 UNIT/ML ~~LOC~~ SOLN
14.0000 [IU] | Freq: Every day | SUBCUTANEOUS | Status: DC
  Administered 2013-10-04 – 2013-10-06 (×3): 14 [IU] via SUBCUTANEOUS
  Filled 2013-10-04 (×4): qty 0.14

## 2013-10-04 MED ORDER — ENSURE PUDDING PO PUDG
1.0000 | Freq: Three times a day (TID) | ORAL | Status: DC
  Administered 2013-10-04 – 2013-10-06 (×4): 1 via ORAL

## 2013-10-04 MED ORDER — GABAPENTIN 300 MG PO CAPS
300.0000 mg | ORAL_CAPSULE | Freq: Two times a day (BID) | ORAL | Status: DC
  Administered 2013-10-04 – 2013-10-06 (×5): 300 mg via ORAL
  Filled 2013-10-04 (×8): qty 1

## 2013-10-04 NOTE — Progress Notes (Signed)
NUTRITION FOLLOW-UP  DOCUMENTATION CODES Per approved criteria  -Severe malnutrition in the context of chronic illness   INTERVENTION: Add Ensure Pudding po TID, each supplement provides 170 kcal and 4 grams of protein If aggressive nutrition interventions desired, recommend consult RD for enteral nutrition recommendations. RD to continue to follow nutrition care plan.  NUTRITION DIAGNOSIS: Inadequate oral intake related to AMS as evidenced by severe muscle mass loss and H&P. Ongoing.  Goal: Intake to meet >90% of estimated nutrition needs.  Monitor:  weight trends, lab trends, I/O's, PO intake, supplement tolerance  ASSESSMENT: PMHx significant for DM2, renal transplant, dementia and recurrent UTI's. Admitted with AMS and difficulty taking POs. Per MD, pt has FTT and fairly rapidly progressive mental decline.   SLP completed BSE on RD arrival. Recommendations are continued NPO except meds crushed in purees. Per chart review, husband is providing pt with regular textures despite NPO status. Despite SLP recommendations, pt is currently ordered for Dysphagia 3 diet with Low Sodium and Carbohydrate Modified Diet. Pt is eating approximately 75% of meals. Noted weekend MD recommending possible palliative or hospice evaluation.  Pt meets criteria for severe MALNUTRITION in the context of chronic illness as evidenced by severe muscle mass loss and suspected intake of <75% of estimated energy intake x a at least 1 month.  Sodium is elevated at 146 but trending down. Blood sugars remain elevated at 145-336.  Height: Ht Readings from Last 1 Encounters:  09/30/13 5\' 3"  (1.6 m)    Weight: Wt Readings from Last 1 Encounters:  09/30/13 140 lb 3.4 oz (63.6 kg)  No new weight available  BMI:  Body mass index is 24.84 kg/(m^2). Normal weight  Estimated Nutritional Needs: Kcal: 1500 - 1700 Protein: 51 - 64 g Fluid: 1.5 - 1.7 liters  Skin: intact  Diet Order: Dysphagia 3;  thins  EDUCATION NEEDS: -No education needs identified at this time   Intake/Output Summary (Last 24 hours) at 10/04/13 1003 Last data filed at 10/04/13 0905  Gross per 24 hour  Intake   1915 ml  Output      0 ml  Net   1915 ml    Last BM: 12/15  Labs:   Recent Labs Lab 10/02/13 0445 10/03/13 0420 10/04/13 0515  NA 152* 147* 146*  K 3.6 3.6 3.6  CL 112 109 109  CO2 33* 30 25  BUN 37* 36* 31*  CREATININE 1.71* 1.72* 1.48*  CALCIUM 8.6 8.3* 8.3*  GLUCOSE 95 242* 215*    CBG (last 3)   Recent Labs  10/03/13 1716 10/03/13 2142 10/04/13 0738  GLUCAP 336* 237* 145*    Scheduled Meds: . allopurinol  150 mg Oral Daily  . amLODipine  10 mg Oral Q breakfast  . calcitRIOL  0.5 mcg Oral Q breakfast  . ceFEPime (MAXIPIME) IV  1 g Intravenous Q24H  . enoxaparin (LOVENOX) injection  30 mg Subcutaneous Q24H  . famotidine  20 mg Oral Daily  . fentaNYL  25 mcg Transdermal See admin instructions  . gabapentin  300 mg Oral BID  . insulin aspart  0-5 Units Subcutaneous QHS  . insulin aspart  0-9 Units Subcutaneous TID WC  . insulin glargine  14 Units Subcutaneous QHS  . labetalol  300 mg Oral TID  . latanoprost  1 drop Both Eyes QHS  . levothyroxine  224 mcg Oral QAC breakfast  . mycophenolate  500 mg Oral BID  . potassium chloride SA  20 mEq Oral Daily  . predniSONE  10 mg Oral Q breakfast  . sirolimus  3 mg Oral Daily    Continuous Infusions: NONE    Jarold Motto MS, RD, LDN Pager: [phone] After-hours pager: [phone]

## 2013-10-04 NOTE — Progress Notes (Signed)
RN witness husband feeding pt regular diet. Husband instructed not to do so by RN. Will continue to monitor.

## 2013-10-04 NOTE — Progress Notes (Signed)
ANTIBIOTIC CONSULT NOTE - INITIAL  Pharmacy Consult for cefepime Indication: UTI  Allergies  Allergen Reactions  . Adhesive [Tape]     Skin irritation, "pulls the skin off"   . Aspirin     Crawling feeling under skin  . Hydrocodone Other (See Comments)    Hallucinations   . Iohexol      Desc: pt unable to tell what the reaction was   . Oxycodone     hallucinations  . Penicillins Hives and Swelling    Has tolerated cefazolin and ceftriaxone.  . Tacrolimus Other (See Comments)    Hallucinations   . Contrast Media [Iodinated Diagnostic Agents] Hives, Swelling and Rash    Patient Measurements: Height: 5\' 3"  (160 cm) Weight: 140 lb 3.4 oz (63.6 kg) IBW/kg (Calculated) : 52.4  Vital Signs: Temp: 98.7 F (37.1 C) (12/15 0539) Temp src: Oral (12/15 0539) BP: 153/57 mmHg (12/15 0539) Pulse Rate: 72 (12/15 0539) Intake/Output from previous day: 12/14 0701 - 12/15 0700 In: 1880 [P.O.:480; I.V.:1200; IV Piggyback:200] Out: -  Intake/Output from this shift:    Labs:  Recent Labs  10/02/13 0445 10/03/13 0420 10/04/13 0515  WBC 7.9 5.2 4.3  HGB 11.5* 11.1* 10.8*  PLT 183 182 198  CREATININE 1.71* 1.72* 1.48*   Estimated Creatinine Clearance: 30 ml/min (by C-G formula based on Cr of 1.48). No results found for this basename: VANCOTROUGH, VANCOPEAK, VANCORANDOM, GENTTROUGH, GENTPEAK, GENTRANDOM, TOBRATROUGH, TOBRAPEAK, TOBRARND, AMIKACINPEAK, AMIKACINTROU, AMIKACIN,  in the last 72 hours   Microbiology: Recent Results (from the past 720 hour(s))  CULTURE, BLOOD (ROUTINE X 2)     Status: None   Collection Time    09/30/13 11:00 AM      Result Value Range Status   Specimen Description BLOOD RIGHT ANTECUBITAL   Final   Special Requests BOTTLES DRAWN AEROBIC ONLY   Final   Culture  Setup Time     Final   Value: 09/30/2013 15:09     Performed at [facility]   Culture     Final   Value: ENTEROBACTER CLOACAE     4098 Note: Gram Stain Report Called  to,Read Back By and Verified With: Cherlynn Kaiser 10/01/13 [facility]     Performed at [facility]   Report Status 10/03/2013 FINAL   Final   Organism ID, Bacteria ENTEROBACTER CLOACAE   Final  CULTURE, BLOOD (ROUTINE X 2)     Status: None   Collection Time    09/30/13 11:10 AM      Result Value Range Status   Specimen Description BLOOD RIGHT HAND   Final   Special Requests BOTTLES DRAWN AEROBIC ONLY   Final   Culture  Setup Time     Final   Value: 09/30/2013 15:09     Performed at [facility]   Culture     Final   Value:        BLOOD CULTURE RECEIVED NO GROWTH TO DATE CULTURE WILL BE HELD FOR 5 DAYS BEFORE ISSUING A FINAL NEGATIVE REPORT     Performed at [facility]   Report Status PENDING   Incomplete  URINE CULTURE     Status: None   Collection Time    09/30/13 11:30 AM      Result Value Range Status   Specimen Description URINE, CATHETERIZED   Final   Special Requests NONE   Final   Culture  Setup Time     Final   Value: 09/30/2013  12:13     Performed at [facility] Count     Final   Value: >=100,000 COLONIES/ML     Performed at [facility]   Culture     Final   Value: Multiple bacterial morphotypes present, none predominant. Suggest appropriate recollection if clinically indicated.     Performed at [facility]   Report Status 10/01/2013 FINAL   Final    Medical History: Past Medical History  Diagnosis Date  . Diabetes mellitus   . Diabetic neuropathy   . Hypothyroidism   . Hypertension   . GERD (gastroesophageal reflux disease)   . Heart murmur     not treated for  . Arthritis   . Chronic kidney disease     RENAL TRANSPLANT IN 2008--PT IS FOLLOWED BY DR. Karie Fetch WITH BUN OF 37 AND CREAT 1.7--PER NOTE DR. NESI FROM 04/11/12  . Urethral diverticulum   . Eyesight diminished     GLAUCOMA   . History of shingles     LEFT EYE JAN 2012--STILL HAS EYE PAIN-STATES NOT ABLE TO TAKE THE MEDICATION FOR  SHINGLES BECAUSE OF HER HX OF KIDNEY TRANSPLANT  . Fingernail abnormalities     FUNGUS OF FINGERNAILS  . UTI (urinary tract infection) 05/18/2013  . UTI (lower urinary tract infection)    Assessment: 63 YOF with hx of kidney transplant admitted with confusion concerning for UTI as this is how patient has presented in past. Pt was started on vanc/cefepime. Now blood cx is positive for enterobacter. Abx has been de-escalate to just cefepime. It's sensitive to it.   Plan:   Cont cefepime 1g IV q24 Consider cont for 14 days

## 2013-10-04 NOTE — Progress Notes (Signed)
Speech Language Pathology Treatment: Dysphagia  Patient Details Name: [name] MRN: [id number] DOB: 1941/03/22 Today's Date: 10/04/2013 Time: 8295-6213 SLP Time Calculation (min): 24 min  Assessment / Plan / Recommendation Clinical Impression  Pt. Is much more alert than described on previous SLP visit.  Diet has been advanced to Dys 3, thin liquids by MD.  RN reports pt's husband has continued to give pt. Items on regular diet.  Currently, pt. Does not exhibit s/s of aspiration, however, she does have a moderate oral phase dysphagia, as evidenced by prolonged chewing, and difficulty forming a bolus in prep for swallow.     HPI HPI: 72 YO BF with DM2, renal transplant, dementia and recurrent UTI's . Presents again with altered mental status with difficulty taking meds and eating. Some cough. No chills or sweats. Bowels OK. Some eye irritation.  Pt followed by SLP services during September 2014 admission.  At that time, MS changes inhibited safe eating.  Pt ultimately underwent MBS study on 07/01/13, which revealed grossly normal swallow with no observed aspiration and oral deficits relative to dementia.     Pertinent Vitals Lung sounds congested, but CXR: NAD; pt. Is afebrile  SLP Plan  Goals updated    Recommendations Diet recommendations: Dysphagia 3 (mechanical soft);Thin liquid (Chopped meats) Liquids provided via: Straw Medication Administration: Whole meds with puree Supervision: Staff to assist with self feeding;Full supervision/cueing for compensatory strategies Compensations: Slow rate;Small sips/bites Postural Changes and/or Swallow Maneuvers: Seated upright 90 degrees              Oral Care Recommendations: Oral care BID Follow up Recommendations: Skilled Nursing facility Plan: Goals updated    GO     [name] T 10/04/2013, 11:49 AM

## 2013-10-04 NOTE — Progress Notes (Signed)
Subjective: Remarkably more alert. Awakened and talked with me. Eating better. Denies pain   Objective: Vital signs in last 24 hours: Temp:  [98 F (36.7 C)-98.8 F (37.1 C)] 98.7 F (37.1 C) (12/15 0539) Pulse Rate:  [72-81] 72 (12/15 0539) Resp:  [18] 18 (12/15 0539) BP: (120-153)/(55-87) 151/87 mmHg (12/15 0801) SpO2:  [95 %-100 %] 99 % (12/15 0539)  Intake/Output from previous day: 12/14 0701 - 12/15 0700 In: 1880 [P.O.:480; I.V.:1200; IV Piggyback:200] Out: -  Intake/Output this shift: Total I/O In: 160 [P.O.:160] Out: -   In no distress. Left periorbital edema. Oral membranes moist. Lungs clear. Ht regular with murmur. abd soft NT awake, mentating better  Lab Results   Recent Labs  10/03/13 0420 10/04/13 0515  WBC 5.2 4.3  RBC 4.09 3.95  HGB 11.1* 10.8*  HCT 36.0 34.4*  MCV 88.0 87.1  MCH 27.1 27.3  RDW 17.5* 17.2*  PLT 182 198    Recent Labs  10/03/13 0420 10/04/13 0515  NA 147* 146*  K 3.6 3.6  CL 109 109  CO2 30 25  GLUCOSE 242* 215*  BUN 36* 31*  CREATININE 1.72* 1.48*  CALCIUM 8.3* 8.3*   Organism ID, Bacteria    ENTEROBACTER CLOACAE     Culture & Susceptibility    ENTEROBACTER CLOACAE    Antibiotic Sensitivity Microscan Status    CEFAZOLIN Resistant >=64 RESISTANT Final    Method: MIC    CEFEPIME Sensitive <=1 SENSITIVE Final    Method: MIC    CEFOXITIN Resistant >=64 RESISTANT Final    Method: MIC    CEFTAZIDIME Sensitive <=1 SENSITIVE Final    Method: MIC    CEFTRIAXONE Sensitive <=1 SENSITIVE Final    Method: MIC    CIPROFLOXACIN Sensitive <=0.25 SENSITIVE Final    Method: MIC    GENTAMICIN Sensitive <=1 SENSITIVE Final    Method: MIC    IMIPENEM Sensitive 0.5 SENSITIVE Final    Method: MIC    PIP/TAZO Sensitive <=4 SENSITIVE Final    Method: MIC    TOBRAMYCIN Sensitive <=1 SENSITIVE Final    Method: MIC    TRIMETH/SULFA Sensitive <=20 SENSITIVE Final    Method: MIC    Comments ENTEROBACTER CLOACAE (MIC)    ENTEROBACTER  CLOACAE           Studies/Results: No results found.  Scheduled Meds: . allopurinol  150 mg Oral Daily  . amLODipine  10 mg Oral Q breakfast  . calcitRIOL  0.5 mcg Oral Q breakfast  . ceFEPime (MAXIPIME) IV  1 g Intravenous Q24H  . enoxaparin (LOVENOX) injection  30 mg Subcutaneous Q24H  . famotidine  20 mg Oral Daily  . fentaNYL  25 mcg Transdermal See admin instructions  . gabapentin  600 mg Oral BID  . insulin aspart  0-5 Units Subcutaneous QHS  . insulin aspart  0-9 Units Subcutaneous TID WC  . insulin glargine  10 Units Subcutaneous QHS  . labetalol  300 mg Oral TID  . latanoprost  1 drop Both Eyes QHS  . levothyroxine  224 mcg Oral QAC breakfast  . mycophenolate  500 mg Oral BID  . potassium chloride SA  20 mEq Oral Daily  . predniSONE  10 mg Oral Q breakfast  . sirolimus  3 mg Oral Daily   Continuous Infusions: . dextrose 5 % and 0.9% NaCl 50 mL/hr at 10/03/13 0356   PRN Meds:  Assessment/Plan: ENTERO BACTER BACTEREMIA: now on rocephin mono tx Urinary tract infection: recurrent. As  above  DM 2: improved , change fluids since she is eating RENAL TRANSPLANT/AKI: better PROGRESSIVE DEMENTIA: would do better in SNF Much more alert today HYPERTENSION: BP OK  PANCYTOPENIA: improved  PROTEIN CALORIE MALNUTRITION: progressive  HYPOTHYROID: On Rx  GOUT: on Rx  HERPES EYE INFX: stable  CHRONIC BACK PAIN: now on 25 ANEMIA: doing fair    LOS: 4 days   Mattheu Brodersen [name] 10/04/2013, 9:49 AM

## 2013-10-05 LAB — GLUCOSE, CAPILLARY
Glucose-Capillary: 198 mg/dL — ABNORMAL HIGH (ref 70–99)
Glucose-Capillary: 273 mg/dL — ABNORMAL HIGH (ref 70–99)

## 2013-10-05 LAB — BASIC METABOLIC PANEL
BUN: 30 mg/dL — ABNORMAL HIGH (ref 6–23)
Calcium: 8.6 mg/dL (ref 8.4–10.5)
Creatinine, Ser: 1.36 mg/dL — ABNORMAL HIGH (ref 0.50–1.10)
GFR calc Af Amer: 43 mL/min — ABNORMAL LOW (ref >90)
GFR calc non Af Amer: 37 mL/min — ABNORMAL LOW (ref >90)
Glucose, Bld: 117 mg/dL — ABNORMAL HIGH (ref 70–99)
Potassium: 4 mEq/L (ref 3.5–5.1)

## 2013-10-05 LAB — CBC
MCH: 26.9 pg (ref 26.0–34.0)
MCHC: 30.9 g/dL (ref 30.0–36.0)
Platelets: 196 10*3/uL (ref 150–400)
RBC: 4.01 MIL/uL (ref 3.87–5.11)

## 2013-10-05 MED ORDER — ENOXAPARIN SODIUM 40 MG/0.4ML ~~LOC~~ SOLN
40.0000 mg | SUBCUTANEOUS | Status: DC
  Administered 2013-10-05 – 2013-10-06 (×2): 40 mg via SUBCUTANEOUS
  Filled 2013-10-05 (×3): qty 0.4

## 2013-10-05 NOTE — Progress Notes (Addendum)
PT Cancellation Note  Patient Details Name: KRISTIA JUPITER MRN: [id number] DOB: [birth year]   Cancelled Treatment:    Reason Eval/Treat Not Completed: Fatigue/lethargy limiting ability to participate.  Pt worked with OT already this AM.  She is not ready to get up to her power chair right not.  PT to check back later this afternoon.   10/05/2013 @14 :16- husband not in room.  PT wanted to assess his ability to preform transfers with wife.  Husband will likely benefit from hoyer lift and hospital bed at home.  PT to try back again later as time allows.      Rollene Rotunda Cesareo Vickrey, PT, DPT [phone]   10/05/2013, 11:10 AM

## 2013-10-05 NOTE — Evaluation (Signed)
Occupational Therapy Evaluation Patient Details Name: [name] MRN: [id number] DOB: Jan 30, 1941 Today's Date: 10/05/2013 Time: 3086-5784 OT Time Calculation (min): 22 min  OT Assessment / Plan / Recommendation History of present illness This 72 y.o.female admitted 08/10/13 with altered mental status since yesterday evening. She was unresponsive to verbal command and confused. This was associated with one episode of vomiting but no fever, neck stiffness or headache. Her husband states that she was able to respond to some of his questions earlier this morning but continues to be confused. At the hospital, she has been put on meropenem, clindamycin, acyclovir and prednisone. A LP was done yesterday (08/10/13). The CSF culture showed some WBC, predominantly PMNs but no organisms were seen.  Her past medical history is significant for a renal transplant in 2008 for which she is on long term prednisone use.  Viral Encephalitis/Metabolic encephalopathy-36 hours of declining mental status associated with low grade fever in immunosuppressed patient- CSF suggestive of possible viral meningitis/encephalitis.   Clinical Impression   Pt requires max - total A for ADLs and ADL mobility. Pt;s husband is primary caregiver and he is adament about taking her home and continuing to care for as he did last admission this year. Pt currently continues to require total A for ADLs and mobility and demos poor sitting balance and requires assist for feeding. Pt's husband states that she has poor sitting balance at home and leans forward when sitting up. Pt not appropriate for acute OT services at this time, OT will sign off    OT Assessment  Patient does not need any further OT services    Follow Up Recommendations  No OT follow up;Supervision/Assistance - 24 hour    Barriers to Discharge      Equipment Recommendations  None recommended by OT    Recommendations for Other Services  PT for sitting balance/transfers?   Frequency       Precautions / Restrictions Precautions Precautions: Fall Restrictions Weight Bearing Restrictions: No   Pertinent Vitals/Pain No c/o pain    ADL  Eating/Feeding: Performed;Maximal assistance Grooming: Performed;Maximal assistance Where Assessed - Grooming: Supported sitting Upper Body Bathing: +1 Total assistance Lower Body Bathing: +1 Total assistance Upper Body Dressing: +1 Total assistance Lower Body Dressing: +1 Total assistance Toilet Transfer: +2 Total assistance;Simulated    OT Diagnosis:    OT Problem List:   OT Treatment Interventions:     OT Goals(Current goals can be found in the care plan section) Acute Rehab OT Goals Patient Stated Goal: none stated  Visit Information  Last OT Received On: 10/05/13 Assistance Needed: +2 History of Present Illness: This 72 y.o.female admitted 08/10/13 with altered mental status since yesterday evening. She was unresponsive to verbal command and confused. This was associated with one episode of vomiting but no fever, neck stiffness or headache. Her husband states that she was able to respond to some of his questions earlier this morning but continues to be confused. At the hospital, she has been put on meropenem, clindamycin, acyclovir and prednisone. A LP was done yesterday (08/10/13). The CSF culture showed some WBC, predominantly PMNs but no organisms were seen.  Her past medical history is significant for a renal transplant in 2008 for which she is on long term prednisone use.  Viral Encephalitis/Metabolic encephalopathy-36 hours of declining mental status associated with low grade fever in immunosuppressed patient- CSF suggestive of possible viral meningitis/encephalitis.       Prior Functioning     Home Living Family/patient expects  to be discharged to:: Private residence Living Arrangements: Spouse/significant other Available Help at Discharge: Family;Available 24 hours/day Type of Home: House Home Access:  Stairs to enter Entrance Stairs-Rails: None Home Layout: One level Home Equipment: Environmental consultant - 4 wheels;Bedside commode;Grab bars - tub/shower;Wheelchair - power Prior Function Level of Independence: Needs assistance Gait / Transfers Assistance Needed: max A from spouse ADL's / Homemaking Assistance Needed: aide 2 x/wk for bathing and dressing, husband provided ADL/selfcare otherwise Communication / Swallowing Assistance Needed: limited verbalization Dominant Hand: Right         Vision/Perception Vision - History Baseline Vision: Wears glasses all the time Patient Visual Report: No change from baseline Perception Perception: Within Functional Limits   Cognition  Cognition Arousal/Alertness: Awake/alert Behavior During Therapy: Flat affect Overall Cognitive Status: History of cognitive impairments - at baseline    Extremity/Trunk Assessment Upper Extremity Assessment Upper Extremity Assessment: Generalized weakness Lower Extremity Assessment Lower Extremity Assessment: Defer to PT evaluation     Mobility Bed Mobility Bed Mobility: Supine to Sit;Sitting - Scoot to [place] of Bed;Scooting to [facility];Sit to Supine Supine to Sit: 1: +2 Total assist Supine to Sit: Patient Percentage: 0% Sitting - Scoot to Edge of Bed: 1: +2 Total assist Sitting - Scoot to Edge of Bed: Patient Percentage: 0% Sit to Supine: 1: +2 Total assist Sit to Supine: Patient Percentage: 0% Scooting to HOB: 1: +2 Total assist Scooting to [facility]: Patient Percentage: 0% Details for Bed Mobility Assistance: used pad to sit pt EOB, poor sitting balance Transfers      Exercise     Balance Balance Balance Assessed: Yes Static Sitting Balance Static Sitting - Balance Support: Bilateral upper extremity supported;Feet supported Static Sitting - Level of Assistance: 3: Mod assist Dynamic Standing Balance Dynamic Standing - Balance Support: Left upper extremity supported;During functional activity Dynamic Standing -  Level of Assistance: 2: Max assist;1: +1 Total assist   End of Session OT - End of Session Activity Tolerance: Patient limited by fatigue Patient left: in bed;with call bell/phone within reach  GO     Galen Manila 10/05/2013, 1:42 PM

## 2013-10-05 NOTE — Progress Notes (Signed)
Subjective: hasnt done as well since last PM. Will wake up a little but not fully alert Trouble getting pills in    Objective: Vital signs in last 24 hours: Temp:  [98.4 F (36.9 C)-99 F (37.2 C)] 99 F (37.2 C) (12/16 0557) Pulse Rate:  [61-72] 72 (12/16 0557) Resp:  [18-20] 20 (12/16 0557) BP: (121-153)/(50-68) 127/68 mmHg (12/16 0557) SpO2:  [96 %-98 %] 98 % (12/16 0557)  Intake/Output from previous day: 12/15 0701 - 12/16 0700 In: 160 [P.O.:160] Out: -  Intake/Output this shift:    Lying flat. Moist oral membranes. Lungs clear ht regular abd soft NT Not very verbal  Lab Results   Recent Labs  10/04/13 0515 10/05/13 0503  WBC 4.3 3.8*  RBC 3.95 4.01  HGB 10.8* 10.8*  HCT 34.4* 34.9*  MCV 87.1 87.0  MCH 27.3 26.9  RDW 17.2* 17.1*  PLT 198 196    Recent Labs  10/04/13 0515 10/05/13 0503  NA 146* 142  K 3.6 4.0  CL 109 105  CO2 25 25  GLUCOSE 215* 117*  BUN 31* 30*  CREATININE 1.48* 1.36*  CALCIUM 8.3* 8.6    Studies/Results: No results found.  Scheduled Meds: . allopurinol  150 mg Oral Daily  . amLODipine  10 mg Oral Q breakfast  . calcitRIOL  0.5 mcg Oral Q breakfast  . ceFEPime (MAXIPIME) IV  1 g Intravenous Q24H  . enoxaparin (LOVENOX) injection  30 mg Subcutaneous Q24H  . famotidine  20 mg Oral Daily  . feeding supplement (ENSURE)  1 Container Oral TID BM  . fentaNYL  25 mcg Transdermal See admin instructions  . gabapentin  300 mg Oral BID  . insulin aspart  0-5 Units Subcutaneous QHS  . insulin aspart  0-9 Units Subcutaneous TID WC  . insulin glargine  14 Units Subcutaneous QHS  . labetalol  300 mg Oral TID  . latanoprost  1 drop Both Eyes QHS  . levothyroxine  224 mcg Oral QAC breakfast  . mycophenolate  500 mg Oral BID  . potassium chloride SA  20 mEq Oral Daily  . predniSONE  10 mg Oral Q breakfast  . sirolimus  3 mg Oral Daily   Continuous Infusions:  PRN Meds:  Assessment/Plan:  ENTERO BACTER BACTEREMIA:  rocephin mono tx  , no fever Urinary tract infection: recurrent. As above  DM 2: improved , May need more fluids RENAL TRANSPLANT/AKI: better  PROGRESSIVE DEMENTIA: clearly failing. Husband not willing to consider SNF HYPERTENSION: BP OK  PANCYTOPENIA: improved  PROTEIN CALORIE MALNUTRITION: progressive  HYPOTHYROID: On Rx  GOUT: on Rx  HERPES EYE INFX: stable  CHRONIC BACK PAIN: now on 25  ANEMIA: doing fair    LOS: 5 days   Arrow Tomko [name] 10/05/2013, 8:18 AM

## 2013-10-05 NOTE — Progress Notes (Signed)
Inpatient Diabetes Program Recommendations  AACE/ADA: New Consensus Statement on Inpatient Glycemic Control (2013)  Target Ranges:  Prepandial:   less than 140 mg/dL      Peak postprandial:   less than 180 mg/dL (1-2 hours)      Critically ill patients:  140 - 180 mg/dL   Reason for Visit:Results for TAMAYA, PUN (MRN [id number]) as of 10/05/2013 10:30  Ref. Range 10/04/2013 12:05 10/04/2013 17:37 10/04/2013 22:05 10/05/2013 05:03 10/05/2013 08:01  Glucose-Capillary Latest Range: 70-99 mg/dL 409 (H) 811 (H) 914 (H)  69 (L)   Note Hyperglycemia with lunch and supper.  May consider adding Novolog meal coverage 3 units tid with meals -Hold if patient eats less than 50%.  Will follow.  Thanks, Beryl Meager, RN, BC-ADM Inpatient Diabetes Coordinator Pager [phone]

## 2013-10-06 LAB — CBC
HCT: 38.3 % (ref 36.0–46.0)
Hemoglobin: 12.2 g/dL (ref 12.0–15.0)
MCHC: 31.9 g/dL (ref 30.0–36.0)
MCV: 87.2 fL (ref 78.0–100.0)
RBC: 4.39 MIL/uL (ref 3.87–5.11)
WBC: 4.1 10*3/uL (ref 4.0–10.5)

## 2013-10-06 LAB — GLUCOSE, CAPILLARY
Glucose-Capillary: 70 mg/dL (ref 70–99)
Glucose-Capillary: 242 mg/dL — ABNORMAL HIGH (ref 70–99)

## 2013-10-06 LAB — BASIC METABOLIC PANEL
BUN: 28 mg/dL — ABNORMAL HIGH (ref 6–23)
CO2: 28 mEq/L (ref 19–32)
Chloride: 108 mEq/L (ref 96–112)
Creatinine, Ser: 1.34 mg/dL — ABNORMAL HIGH (ref 0.50–1.10)
GFR calc Af Amer: 44 mL/min — ABNORMAL LOW (ref >90)
Potassium: 4.4 mEq/L (ref 3.5–5.1)
Sodium: 144 mEq/L (ref 135–145)

## 2013-10-06 LAB — CULTURE, BLOOD (ROUTINE X 2): Culture: NO GROWTH

## 2013-10-06 NOTE — Progress Notes (Signed)
Came to visit patient at bedside on behalf of [facility] Care Management services. Spoke with husband who was currently feeding patient. Discussed discharge planning and the recommendation for SNF. Mr Bar still refuses SNF at this time. Discussed [facility] Care Management services and consents were signed. Husband reports patient is active with [facility] services. Confirmed with [name] with [facility] that patient is active with [place]. If husband continues to refuse SNF, patient will need home health orders. Explained to husband that [facility] Care Management will not interfere or replace home health services. Patient will receive post hospital discharge call and will be evaluated for monthly home visits. Left packet at bedside for [facility] Care Management. Will notify inpatient RNCM that [facility] Care Management will follow post discharge. Raiford Noble, MSN- Ed, RN,BSN- [facility] [phone]

## 2013-10-06 NOTE — Progress Notes (Addendum)
Speech Language Pathology Treatment: Dysphagia  Patient Details Name: HIAWATHA DRESSEL MRN: [id number] DOB: 10-Jul-1941 Today's Date: 10/06/2013 Time: 4098-1191 SLP Time Calculation (min): 25 min  Assessment / Plan / Recommendation Clinical Impression  Pt. With fluctuating LOA.  Husband at bedside, and reports pt. Ate "home fries" without difficulty.  SLP had cleared pt. On 12/15 for Dys 3 diet with chopped meats and thin liquids.  Diet order was changed (by RN?) to dys 2 with nectar, likely due to lethargy.  When pt. Is alert, she should tolerate Dys 3, chopped meats and thin liquids.  Currently, pt. Will awaken and accepts sips of thin liquids via straw without over s/s of aspiration.  Pt.'s husband was re-educated to aspiration precautions, including NOT feeding pt. When alert, upright position for po's, small bites/sips, and appropriate textures on Dys 2 and 3 diets.  Husband states, "She won't drink the thick stuff, and if she doesn't want to eat something will spit it out."  Husband has been non-compliant with recommendations, and is not receptive to education.  He will likely continue to give pt. Whatever he wants, regardless of education efforts.  Pt. Is a full code.  Palliative Care Consult is recommended for GOC.  SLP will sign-off at this time, as there is little more to offer.   HPI HPI: 72 YO BF with DM2, renal transplant, dementia and recurrent UTI's . Presents again with altered mental status with difficulty taking meds and eating. Some cough. No chills or sweats. Bowels OK. Some eye irritation.  Pt followed by SLP services during September 2014 admission.  At that time, MS changes inhibited safe eating.  Pt ultimately underwent MBS study on 07/01/13, which revealed grossly normal swallow with no observed aspiration and oral deficits relative to dementia.     Pertinent Vitals Low grade fever; coarse BS per RN; Last CXT 12/11 NAD  SLP Plan  D/C ST   Recommendations Diet recommendations:  Dysphagia 2 (fine chop);Thin liquid (When alert) Liquids provided via: Straw Medication Administration: Whole meds with puree (Crush prn) Supervision: Staff to assist with self feeding;Full supervision/cueing for compensatory strategies Compensations: Slow rate;Small sips/bites Postural Changes and/or Swallow Maneuvers: Seated upright 90 degrees              Oral Care Recommendations: Oral care BID Follow up Recommendations: 24 hour supervision/assistance;Skilled Nursing facility Plan: Continue with current plan of care    GO     [name] T 10/06/2013, 1:09 PM

## 2013-10-06 NOTE — Progress Notes (Signed)
Subjective: Not waking up at all this AM. Took some meds but no food yet  Objective: Vital signs in last 24 hours: Temp:  [99 F (37.2 C)-99.4 F (37.4 C)] 99 F (37.2 C) (12/16 2019) Pulse Rate:  [67-76] 70 (12/17 0631) Resp:  [18-20] 20 (12/17 0631) BP: (113-134)/(61-65) 131/62 mmHg (12/17 0631) SpO2:  [97 %-99 %] 97 % (12/17 0631)  Intake/Output from previous day: 12/16 0701 - 12/17 0700 In: 150 [P.O.:150] Out: -  Intake/Output this shift:    Lying on right in no distress. nonverbal  Lab Results   Recent Labs  10/05/13 0503 10/06/13 0525  WBC 3.8* 4.1  RBC 4.01 4.39  HGB 10.8* 12.2  HCT 34.9* 38.3  MCV 87.0 87.2  MCH 26.9 27.8  RDW 17.1* 17.0*  PLT 196 227    Recent Labs  10/05/13 0503 10/06/13 0525  NA 142 144  K 4.0 4.4  CL 105 108  CO2 25 28  GLUCOSE 117* 104*  BUN 30* 28*  CREATININE 1.36* 1.34*  CALCIUM 8.6 9.3    Studies/Results: No results found.  Scheduled Meds: . allopurinol  150 mg Oral Daily  . amLODipine  10 mg Oral Q breakfast  . calcitRIOL  0.5 mcg Oral Q breakfast  . ceFEPime (MAXIPIME) IV  1 g Intravenous Q24H  . enoxaparin (LOVENOX) injection  40 mg Subcutaneous Q24H  . famotidine  20 mg Oral Daily  . feeding supplement (ENSURE)  1 Container Oral TID BM  . fentaNYL  25 mcg Transdermal See admin instructions  . gabapentin  300 mg Oral BID  . insulin aspart  0-5 Units Subcutaneous QHS  . insulin aspart  0-9 Units Subcutaneous TID WC  . insulin glargine  14 Units Subcutaneous QHS  . labetalol  300 mg Oral TID  . latanoprost  1 drop Both Eyes QHS  . levothyroxine  224 mcg Oral QAC breakfast  . mycophenolate  500 mg Oral BID  . potassium chloride SA  20 mEq Oral Daily  . predniSONE  10 mg Oral Q breakfast  . sirolimus  3 mg Oral Daily   Continuous Infusions:  PRN Meds:  Assessment/Plan:   ENTERO BACTER BACTEREMIA: one more day of Rx IV needed for bacteremia Urinary tract infection: recurrent. As above  DM 2: improved ,  May need more fluids  RENAL TRANSPLANT/AKI: better  PROGRESSIVE DEMENTIA: clearly failing. Husband not willing to consider SNF  HYPERTENSION: BP OK  PANCYTOPENIA: improved  PROTEIN CALORIE MALNUTRITION: progressive  HYPOTHYROID: On Rx  GOUT: on Rx  HERPES EYE INFX: stable  CHRONIC BACK PAIN: now on 25  ANEMIA: doing fair   LOS: 6 days   Ereka Brau [name] 10/06/2013, 8:46 AM

## 2013-10-06 NOTE — Evaluation (Signed)
Physical Therapy Evaluation Patient Details Name: [name] MRN: [id number] DOB: Mar 09, 1941 Today's Date: 10/06/2013 Time: 4098-1191 PT Time Calculation (min): 23 min  PT Assessment / Plan / Recommendation History of Present Illness  This 72 y.o.female admitted 08/10/13 with altered mental status since yesterday evening. She was unresponsive to verbal command and confused. This was associated with one episode of vomiting but no fever, neck stiffness or headache. Her husband states that she was able to respond to some of his questions earlier this morning but continues to be confused. At the hospital, she has been put on meropenem, clindamycin, acyclovir and prednisone. A LP was done yesterday (08/10/13). The CSF culture showed some WBC, predominantly PMNs but no organisms were seen.  Her past medical history is significant for a renal transplant in 2008 for which she is on long term prednisone use.  Viral Encephalitis/Metabolic encephalopathy-36 hours of declining mental status associated with low grade fever in immunosuppressed patient- CSF suggestive of possible viral meningitis/encephalitis.  Clinical Impression  Pt's husband was not there when I came by again today.  I would like to see how he manages her for the transfer into the electric scooter. She is total assist for all mobility for me today on evaluation.  I would recommend a hospital bed and a hoyer lift to help decrease caregiver burden at home and prevent her caregiver from hurting himself so she can continue to stay at home with him.  Next session I will make every effort to come by when he is present in her room.   PT to follow acutely for deficits listed below.     PT Assessment  Patient needs continued PT services    Follow Up Recommendations  No PT follow up    Does the patient have the potential to tolerate intense rehabilitation     NA  Barriers to Discharge   None      Equipment Recommendations  Hospital bed;Other  (comment) (hoyer lift)    Recommendations for Other Services   None  Frequency Other (Comment) (one more session to assess husband's ability to tranasfer )    Precautions / Restrictions Precautions Precautions: Fall   Pertinent Vitals/Pain No signs of pain or distress with bed mobility      Mobility  Bed Mobility Bed Mobility: Supine to Sit;Sitting - Scoot to Edge of Bed;Rolling Right;Rolling Left;Sit to Supine Rolling Right: 1: +1 Total assist Rolling Left: 1: +1 Total assist Supine to Sit: 1: +1 Total assist Sitting - Scoot to Edge of Bed: 2: Max assist Sit to Supine: 1: +1 Total assist Scooting to HOB: 1: +1 Total assist Details for Bed Mobility Assistance: no initiation of going from supine to sit, but once sitting, pt was able to support herself some while therapist used bed pad to weight shift hips to EOB.   Transfers Transfers: Sit to Stand;Stand to Sit Sit to Stand: 1: +1 Total assist Stand to Sit: 1: +1 Total assist Details for Transfer Assistance: blocked bil knees and used the bed pad to support and manually extend hips.  Pt. was able to support herself some in standing, but likely <10% Ambulation/Gait Ambulation/Gait Assistance: Not tested (comment) (pt was non-ambulatory PTA)        PT Diagnosis: Difficulty walking;Abnormality of gait;Generalized weakness;Altered mental status  PT Problem List: Decreased strength;Decreased range of motion;Decreased activity tolerance;Decreased balance;Decreased mobility;Decreased cognition PT Treatment Interventions: DME instruction;Stair training;Functional mobility training;Therapeutic activities;Therapeutic exercise;Balance training;Neuromuscular re-education;Cognitive remediation;Patient/family education     PT Goals(Current  goals can be found in the care plan section) Acute Rehab PT Goals Patient Stated Goal: unable to state PT Goal Formulation: Patient unable to participate in goal setting Time For Goal Achievement:  10/13/13 Potential to Achieve Goals: Fair  Visit Information  Last PT Received On: 10/06/13 Assistance Needed: +2 (for transfers for safety) History of Present Illness: This 72 y.o.female admitted 08/10/13 with altered mental status since yesterday evening. She was unresponsive to verbal command and confused. This was associated with one episode of vomiting but no fever, neck stiffness or headache. Her husband states that she was able to respond to some of his questions earlier this morning but continues to be confused. At the hospital, she has been put on meropenem, clindamycin, acyclovir and prednisone. A LP was done yesterday (08/10/13). The CSF culture showed some WBC, predominantly PMNs but no organisms were seen.  Her past medical history is significant for a renal transplant in 2008 for which she is on long term prednisone use.  Viral Encephalitis/Metabolic encephalopathy-36 hours of declining mental status associated with low grade fever in immunosuppressed patient- CSF suggestive of possible viral meningitis/encephalitis.       Prior Functioning  Home Living Family/patient expects to be discharged to:: Private residence Living Arrangements: Spouse/significant other Available Help at Discharge: Family;Available 24 hours/day Type of Home: House Home Access: Stairs to enter [employer] of Steps: 2 Entrance Stairs-Rails: None Home Layout: One level Home Equipment: Walker - 4 wheels;Bedside commode;Grab bars - tub/shower;Wheelchair - power Additional Comments: Pt transfers bed to wheelchair, w/c to [facility] only.  Spouse reports she only takes a couple of steps with rollator Prior Function Level of Independence: Needs assistance Gait / Transfers Assistance Needed: max A from spouse ADL's / Homemaking Assistance Needed: aide 2 x/wk for bathing and dressing, husband provided ADL/selfcare otherwise Communication / Swallowing Assistance Needed: limited  verbalization Communication Communication: Other (comment) (no verbalization during my session) Dominant Hand: Right    Cognition  Cognition Arousal/Alertness: Lethargic Overall Cognitive Status: No family/caregiver present to determine baseline cognitive functioning    Extremity/Trunk Assessment Upper Extremity Assessment Upper Extremity Assessment: Defer to OT evaluation Lower Extremity Assessment Lower Extremity Assessment: RLE deficits/detail;LLE deficits/detail RLE Deficits / Details: right leg seems weaker than left leg as pt was able to spontaneously move left leg during mobility, but not to command.  Right leg showed no active muscle contraction during movement.   LLE Deficits / Details: right leg seems weaker than left leg as pt was able to spontaneously move left leg during mobility, but not to command.  Right leg showed no active muscle contraction during movement.   Cervical / Trunk Assessment Cervical / Trunk Assessment: Other exceptions Cervical / Trunk Exceptions: In sitting pt unable to hold head upright on her own power   Balance Balance Balance Assessed: Yes Static Sitting Balance Static Sitting - Balance Support: Bilateral upper extremity supported Static Sitting - Level of Assistance: 3: Mod assist Static Sitting - Comment/# of Minutes: mod assist to support trunk in midline (pt wants to lean to the right) and to help manually hold her head upright.   Static Standing Balance Static Standing - Balance Support: No upper extremity supported Static Standing - Level of Assistance: 1: +1 Total assist Static Standing - Comment/# of Minutes: pt was not completely flaccid in standing, but could not have been helping more than 10%.  Bil knees blocked and bed pad used to extend hips.    End of Session PT - End of  Session Activity Tolerance: Patient limited by fatigue Patient left: in bed;with call bell/phone within reach;with bed alarm set    Azyria Osmon B. Margurite Duffy, PT, DPT  [phone]   10/06/2013, 4:23 PM

## 2013-10-06 NOTE — Plan of Care (Signed)
Problem: SLP Dysphagia Goals Goal: Patient will utilize recommended strategies Patient will utilize recommended strategies during swallow to increase swallowing safety with  Outcome: Not Met (add Reason) Pt. With fluctuating LOA.  Husband non-compliant with diet recommendations.

## 2013-10-07 LAB — CBC
HCT: 34.4 % — ABNORMAL LOW (ref 36.0–46.0)
MCH: 27.3 pg (ref 26.0–34.0)
MCHC: 32.3 g/dL (ref 30.0–36.0)
MCV: 84.5 fL (ref 78.0–100.0)
Platelets: 226 10*3/uL (ref 150–400)
RDW: 17.1 % — ABNORMAL HIGH (ref 11.5–15.5)

## 2013-10-07 LAB — GLUCOSE, CAPILLARY
Glucose-Capillary: 23 mg/dL — CL (ref 70–99)
Glucose-Capillary: 105 mg/dL — ABNORMAL HIGH (ref 70–99)
Glucose-Capillary: 242 mg/dL — ABNORMAL HIGH (ref 70–99)

## 2013-10-07 LAB — BASIC METABOLIC PANEL
BUN: 27 mg/dL — ABNORMAL HIGH (ref 6–23)
CO2: 27 mEq/L (ref 19–32)
Chloride: 110 mEq/L (ref 96–112)
Creatinine, Ser: 1.34 mg/dL — ABNORMAL HIGH (ref 0.50–1.10)
GFR calc non Af Amer: 38 mL/min — ABNORMAL LOW (ref >90)

## 2013-10-07 MED ORDER — DEXTROSE 50 % IV SOLN
50.0000 mL | Freq: Once | INTRAVENOUS | Status: AC | PRN
  Administered 2013-10-07: 09:00:00 50 mL via INTRAVENOUS

## 2013-10-07 MED ORDER — INSULIN DETEMIR 100 UNIT/ML ~~LOC~~ SOLN: 12.0000 [IU] | Freq: Every morning | SUBCUTANEOUS | Status: DC

## 2013-10-07 MED ORDER — FENTANYL 25 MCG/HR TD PT72: 25.0000 ug | MEDICATED_PATCH | TRANSDERMAL | Status: DC

## 2013-10-07 MED ORDER — DEXTROSE 50 % IV SOLN
INTRAVENOUS | Status: AC
  Filled 2013-10-07: qty 50

## 2013-10-07 NOTE — Progress Notes (Addendum)
Inpatient Diabetes Program Recommendations  AACE/ADA: New Consensus Statement on Inpatient Glycemic Control (2013)  Target Ranges:  Prepandial:   less than 140 mg/dL      Peak postprandial:   less than 180 mg/dL (1-2 hours)      Critically ill patients:  140 - 180 mg/dL   Reason for Visit: Results for SHALA, BAUMBACH (MRN [id number]) as of 10/07/2013 09:42  Ref. Range 10/07/2013 08:26 10/07/2013 09:09  Glucose-Capillary Latest Range: 70-99 mg/dL 23 (LL) 409 (H)   Note that patient received 14 units of Lantus last PM and CBG low this morning.  Called and discussed with Dr. Evlyn Kanner.  States home dose of Levemir should be decreased to 12 units daily.  Talked to RN Diane regarding change.  She states that she will follow-up if this change is not reflected on discharge summary.  Thanks, Beryl Meager, RN, BC-ADM Inpatient Diabetes Coordinator Pager [phone]

## 2013-10-07 NOTE — Discharge Summary (Signed)
DISCHARGE SUMMARY  [name]  MR#: [id number]  DOB:Mar 28, 1941  Date of Admission: 09/30/2013 Date of Discharge: 10/07/2013  Attending Physician:Gagan Dillion [name]  Patient's WUJ:[id number],YNWGNFA [name], MD  Consults none  Discharge Diagnoses: Active Problems:   Urinary tract infection ENTERO BACTER BACTEREMIA: with IV abx completed   DM 2: improved ,stable  RENAL TRANSPLANT/AKI: better  PROGRESSIVE DEMENTIA:   HYPERTENSION:  PANCYTOPENIA:    PROTEIN CALORIE MALNUTRITION: progressive  HYPOTHYROID: On Rx  GOUT: on Rx  HERPES EYE INFX:   CHRONIC BACK PAIN:   ANEMIA:    Discharge Medications:   Medication List    STOP taking these medications       fentaNYL 12 MCG/HR  Commonly known as:  DURAGESIC - dosed mcg/hr     fentaNYL 50 MCG/HR  Commonly known as:  DURAGESIC - dosed mcg/hr  Replaced by:  fentaNYL 25 MCG/HR patch     furosemide 40 MG tablet  Commonly known as:  LASIX      TAKE these medications       allopurinol 300 MG tablet  Commonly known as:  ZYLOPRIM  Take 150 mg by mouth daily.     amLODipine 10 MG tablet  Commonly known as:  NORVASC  Take 1 tablet (10 mg total) by mouth daily with breakfast.     calcitRIOL 0.5 MCG capsule  Commonly known as:  ROCALTROL  Take 1 capsule (0.5 mcg total) by mouth daily with breakfast.     fentaNYL 25 MCG/HR patch  Commonly known as:  DURAGESIC - dosed mcg/hr  Place 1 patch (25 mcg total) onto the skin See admin instructions.     gabapentin 300 MG capsule  Commonly known as:  NEURONTIN  Take 600 mg by mouth 2 (two) times daily.     insulin detemir 100 UNIT/ML injection  Commonly known as:  LEVEMIR  Inject 18 Units into the skin every morning.     insulin lispro 100 UNIT/ML injection  Commonly known as:  HUMALOG  Inject 0-4 Units into the skin 3 (three) times daily before meals. Per sliding scale     labetalol 200 MG tablet  Commonly known as:  NORMODYNE  Take 1.5 tablets (300 mg total) by mouth 3  (three) times daily. Takes 1 and 1/2 tabs     latanoprost 0.005 % ophthalmic solution  Commonly known as:  XALATAN  Place 1 drop into both eyes at bedtime.     levothyroxine 112 MCG tablet  Commonly known as:  SYNTHROID, LEVOTHROID  Take 2 tablets (224 mcg total) by mouth daily with breakfast.     mycophenolate 250 MG capsule  Commonly known as:  CELLCEPT  Take 2 capsules (500 mg total) by mouth 2 (two) times daily.     potassium chloride SA 20 MEQ tablet  Commonly known as:  K-DUR,KLOR-CON  Take 1 tablet (20 mEq total) by mouth daily.     predniSONE 10 MG tablet  Commonly known as:  DELTASONE  Take 10 mg by mouth daily with breakfast.     ranitidine 150 MG tablet  Commonly known as:  ZANTAC  Take 1 tablet (150 mg total) by mouth daily.     sirolimus 1 MG tablet  Commonly known as:  RAPAMUNE  Take 3 tablets (3 mg total) by mouth daily. Pt takes three 1 mg tablets = 3 mg daily        Hospital Procedures: Dg Chest Portable 1 View  09/30/2013   CLINICAL DATA:  Confusion, possible aspiration.  EXAM: PORTABLE CHEST - 1 VIEW  COMPARISON:  August 10, 2013  FINDINGS: The heart size and mediastinal contours are stable. Overlying artifact is noted over the bilateral upper lungs. Both lungs are clear. The visualized skeletal structures are stable.  IMPRESSION: No active cardiopulmonary disease.   Electronically Signed   By: Sherian Rein M.D.   On: 09/30/2013 11:29    History of Present Illness: confusion  Hospital Course: 72 YO BF with progressive mental decline, recurrent UTI, hx renal transplant. Presents with a similar pattern of fever, leukocytosis and altered mental status. Cultures showed enterobacter bacteremia without true sepsis. Has responded to IV abx and has completed 7 days. Still very weak and in significant decline. Mental status waxes and wanes and PO intake is variable. Spending more time less responsive. Dementia appears to be rapidly progressive. DM and BP have  done OK. Renal function is back to her baseline. Most of her issues are not fixable. Discussed SNF again but husband wants to take her home with resumption of home health. Broached Hospice referral but he is not ready for this yet. BP has done OK Likely to do poorly in the short term  Day of Discharge Exam BP 174/74  Pulse 68  Temp(Src) 98.3 F (36.8 C) (Oral)  Resp 20  Ht 5\' 3"  (1.6 m)  Wt 63.6 kg (140 lb 3.4 oz)  BMI 24.84 kg/m2  SpO2 98%  Physical Exam: General appearance: frail. Lying flat in no distress. Left periorbital swelling   Resp: clear with no wheeze, no accessory ms in use Cardio: regular with harsh murmur, some skips GI: soft, non-tender; bowel sounds normal; no masses,  no organomegaly, healed scars Extremities: wasting is prominent. Pulses intact, no foot lesions Somnolent, will flutter eyes open, min verbalization. Weaker on left  Discharge Labs:  Recent Labs  10/06/13 0525 10/07/13 0615  NA 144 143  K 4.4 4.3  CL 108 110  CO2 28 27  GLUCOSE 104* 45*  BUN 28* 27*  CREATININE 1.34* 1.34*  CALCIUM 9.3 9.1     Recent Labs  10/06/13 0525 10/07/13 0615  WBC 4.1 3.1*  HGB 12.2 11.1*  HCT 38.3 34.4*  MCV 87.2 84.5  PLT 227 226   BLOOD CULTURE  ENTEROBACTER CLOACAE     Culture & Susceptibility    ENTEROBACTER CLOACAE    Antibiotic Sensitivity Microscan Status    CEFAZOLIN Resistant >=64 RESISTANT Final    Method: MIC    CEFEPIME Sensitive <=1 SENSITIVE Final    Method: MIC    CEFOXITIN Resistant >=64 RESISTANT Final    Method: MIC    CEFTAZIDIME Sensitive <=1 SENSITIVE Final    Method: MIC    CEFTRIAXONE Sensitive <=1 SENSITIVE Final    Method: MIC    CIPROFLOXACIN Sensitive <=0.25 SENSITIVE Final    Method: MIC    GENTAMICIN Sensitive <=1 SENSITIVE Final    Method: MIC    IMIPENEM Sensitive 0.5 SENSITIVE Final    Method: MIC    PIP/TAZO Sensitive <=4 SENSITIVE Final    Method: MIC    TOBRAMYCIN Sensitive <=1 SENSITIVE Final     Method: MIC    TRIMETH/SULFA Sensitive <=20 SENSITIVE Final    Method: MIC    Comments ENTEROBACTER CLOACAE (MIC)    ENTEROBACTER CLOACAE        Results for EMBERLI, BALLESTER (MRN [id number]) as of 10/07/2013 08:31  Ref. Range 09/30/2013 10:59  Lactic Acid, Venous Latest Range: 0.5-2.2 mmol/L 1.2  Discharge instructions: resume prior DM management   Disposition: to home Follow-up Appts: Follow-up with Dr. Evlyn Kanner at [facility] in 2 wks.  Call for appointment.  Condition on Discharge: fair  Tests Needing Follow-up: none  Signed: Britney Newstrom [name] 10/07/2013, 8:28 AM

## 2013-10-07 NOTE — Clinical Social Work Note (Signed)
Per RNCM patient needs ambulance transport home. CSW has completed necessary documentation and placed on chart. Ambulance requested for 12:30PM. RN notified. CSW signing off.  Roddie Mc, [place], [place], [phone]

## 2013-10-07 NOTE — Progress Notes (Signed)
[name] to be D/C'd Home with [facility] per MD order.  Discussed with the patient's husband and all questions fully answered.    Medication List    STOP taking these medications       fentaNYL 12 MCG/HR  Commonly known as:  DURAGESIC - dosed mcg/hr     fentaNYL 50 MCG/HR  Commonly known as:  DURAGESIC - dosed mcg/hr  Replaced by:  fentaNYL 25 MCG/HR patch     furosemide 40 MG tablet  Commonly known as:  LASIX      TAKE these medications       allopurinol 300 MG tablet  Commonly known as:  ZYLOPRIM  Take 150 mg by mouth daily.     amLODipine 10 MG tablet  Commonly known as:  NORVASC  Take 1 tablet (10 mg total) by mouth daily with breakfast.     calcitRIOL 0.5 MCG capsule  Commonly known as:  ROCALTROL  Take 1 capsule (0.5 mcg total) by mouth daily with breakfast.     fentaNYL 25 MCG/HR patch  Commonly known as:  DURAGESIC - dosed mcg/hr  Place 1 patch (25 mcg total) onto the skin See admin instructions.     gabapentin 300 MG capsule  Commonly known as:  NEURONTIN  Take 600 mg by mouth 2 (two) times daily.     insulin detemir 100 UNIT/ML injection  Commonly known as:  LEVEMIR  Inject 0.12 mLs (12 Units total) into the skin every morning.     insulin lispro 100 UNIT/ML injection  Commonly known as:  HUMALOG  Inject 0-4 Units into the skin 3 (three) times daily before meals. Per sliding scale     labetalol 200 MG tablet  Commonly known as:  NORMODYNE  Take 1.5 tablets (300 mg total) by mouth 3 (three) times daily. Takes 1 and 1/2 tabs     latanoprost 0.005 % ophthalmic solution  Commonly known as:  XALATAN  Place 1 drop into both eyes at bedtime.     levothyroxine 112 MCG tablet  Commonly known as:  SYNTHROID, LEVOTHROID  Take 2 tablets (224 mcg total) by mouth daily with breakfast.     mycophenolate 250 MG capsule  Commonly known as:  CELLCEPT  Take 2 capsules (500 mg total) by mouth 2 (two) times daily.     potassium chloride SA 20 MEQ tablet  Commonly  known as:  K-DUR,KLOR-CON  Take 1 tablet (20 mEq total) by mouth daily.     predniSONE 10 MG tablet  Commonly known as:  DELTASONE  Take 10 mg by mouth daily with breakfast.     ranitidine 150 MG tablet  Commonly known as:  ZANTAC  Take 1 tablet (150 mg total) by mouth daily.     sirolimus 1 MG tablet  Commonly known as:  RAPAMUNE  Take 3 tablets (3 mg total) by mouth daily. Pt takes three 1 mg tablets = 3 mg daily        VVS, Skin clean, dry and intact with foam dressing on sacrum  IV catheter discontinued intact. Site without signs and symptoms of complications. Dressing and pressure applied.  An After Visit Summary was printed and given to the patient's family., new prescriptions and medication administration times given.  Patient escorted via [facility]AR on stretcher, and D/C home via ambulance  Cindra Eves, RN 10/07/2013 1:45 PM

## 2013-10-07 NOTE — Progress Notes (Signed)
Physical Therapy Treatment Patient Details Name: [name] MRN: [id number] DOB: 05-20-1941 Today's Date: 10/07/2013 Time: 1308-6578 PT Time Calculation (min): 20 min  PT Assessment / Plan / Recommendation  History of Present Illness This 72 y.o.female admitted 08/10/13 with altered mental status since yesterday evening. She was unresponsive to verbal command and confused. This was associated with one episode of vomiting but no fever, neck stiffness or headache. Her husband states that she was able to respond to some of his questions earlier this morning but continues to be confused. At the hospital, she has been put on meropenem, clindamycin, acyclovir and prednisone. A LP was done yesterday (08/10/13). The CSF culture showed some WBC, predominantly PMNs but no organisms were seen.  Her past medical history is significant for a renal transplant in 2008 for which she is on long term prednisone use.  Viral Encephalitis/Metabolic encephalopathy-36 hours of declining mental status associated with low grade fever in immunosuppressed patient- CSF suggestive of possible viral meningitis/encephalitis.   PT Comments   Today's treatment focused on husband education and assessment of his ability to transfer pt and d/c needs.  Husband A pt with all mobility from rolling in bed for dressing to squat pivot transfer with TOT A.  Husband needed cues for proper body mechanics will helping pt in the bed and use of the electric bed to raise height.  Discussed with husband DME needs.  Recommend hospital bed and mechanical lift as well as HHPT for education on how to use equipment in home and best set-up in their environment.  Follow Up Recommendations  Home health PT (for family education on mechanical lift and equipment)     Does the patient have the potential to tolerate intense rehabilitation     Barriers to Discharge        Equipment Recommendations  Hospital bed;Other (comment) (mechanical lift)     Recommendations for Other Services    Frequency     Progress towards PT Goals Progress towards PT goals: Goals met/education completed, patient discharged from PT  Plan Current plan remains appropriate    Precautions / Restrictions Precautions Precautions: Fall Restrictions Weight Bearing Restrictions: No   Pertinent Vitals/Pain No c/o pain    Mobility  Bed Mobility Bed Mobility: Sit to Supine Rolling Right: 1: +1 Total assist Rolling Left: 1: +1 Total assist Supine to Sit: 1: +1 Total assist Supine to Sit: Patient Percentage: 0% Sit to Supine: 1: +1 Total assist Sit to Supine: Patient Percentage: 0% Scooting to HOB: 1: +1 Total assist Scooting to [facility]: Patient Percentage: 0% Details for Bed Mobility Assistance: Pt with no initiation of helping with bed mobility Transfers Transfers: Stand Pivot Transfers Squat Pivot Transfers: 1: +1 Total assist (husband performed transfer bed <>w/c) Details for Transfer Assistance: Husband performed Squat pivot transfer with pt.  Pt shows good use of bed pad to A. Ambulation/Gait Ambulation/Gait Assistance: Not tested (comment)    Exercises     PT Diagnosis:    PT Problem List:   PT Treatment Interventions:     PT Goals (current goals can now be found in the care plan section)    Visit Information  Last PT Received On: 10/07/13 History of Present Illness: This 72 y.o.female admitted 08/10/13 with altered mental status since yesterday evening. She was unresponsive to verbal command and confused. This was associated with one episode of vomiting but no fever, neck stiffness or headache. Her husband states that she was able to respond to some of his  questions earlier this morning but continues to be confused. At the hospital, she has been put on meropenem, clindamycin, acyclovir and prednisone. A LP was done yesterday (08/10/13). The CSF culture showed some WBC, predominantly PMNs but no organisms were seen.  Her past medical history is  significant for a renal transplant in 2008 for which she is on long term prednisone use.  Viral Encephalitis/Metabolic encephalopathy-36 hours of declining mental status associated with low grade fever in immunosuppressed patient- CSF suggestive of possible viral meningitis/encephalitis.    Subjective Data  Subjective: husband present and states pt going home today.  Lengthy discussion re: d/c needs.   Cognition  Cognition Arousal/Alertness: Lethargic Behavior During Therapy: Flat affect Overall Cognitive Status: History of cognitive impairments - at baseline    Balance  Balance Balance Assessed: Yes Static Sitting Balance Static Sitting - Balance Support: Bilateral upper extremity supported Static Sitting - Level of Assistance: 4: Min assist  End of Session PT - End of Session Activity Tolerance: Patient limited by fatigue Patient left: in bed;with family/visitor present Nurse Communication:  (d/c needs)   GP     Ticia Virgo LUBECK 10/07/2013, 11:09 AM

## 2013-10-07 NOTE — Progress Notes (Signed)
Hypoglycemic Event  CBG: 23  Treatment: D50 IV 50 mL  Symptoms: Sweaty  Follow-up CBG: Time:0900 CBG Result:103  Possible Reasons for Event: Inadequate meal intake  Comments/MD notified:Stephen Soouth    Zuhayr Deeney Czarnecki  Remember to initiate Hypoglycemia Order Set & complete

## 2013-10-16 ENCOUNTER — Other Ambulatory Visit: Payer: Self-pay | Admitting: Nurse Practitioner

## 2013-11-10 ENCOUNTER — Emergency Department (HOSPITAL_COMMUNITY): Payer: Medicare Other

## 2013-11-10 ENCOUNTER — Emergency Department (HOSPITAL_COMMUNITY)
Admission: EM | Admit: 2013-11-10 | Discharge: 2013-11-10 | Disposition: A | Discharge status: Home / Self Care | Payer: Medicare Other | Attending: Emergency Medicine | Admitting: Emergency Medicine

## 2013-11-10 ENCOUNTER — Encounter (HOSPITAL_COMMUNITY): Payer: Self-pay | Admitting: Emergency Medicine

## 2013-11-10 DIAGNOSIS — E1149 Type 2 diabetes mellitus with other diabetic neurological complication: Secondary | ICD-10-CM | POA: Insufficient documentation

## 2013-11-10 DIAGNOSIS — Z940 Kidney transplant status: Secondary | ICD-10-CM | POA: Insufficient documentation

## 2013-11-10 DIAGNOSIS — Z8619 Personal history of other infectious and parasitic diseases: Secondary | ICD-10-CM | POA: Insufficient documentation

## 2013-11-10 DIAGNOSIS — K219 Gastro-esophageal reflux disease without esophagitis: Secondary | ICD-10-CM | POA: Insufficient documentation

## 2013-11-10 DIAGNOSIS — Z79899 Other long term (current) drug therapy: Secondary | ICD-10-CM | POA: Insufficient documentation

## 2013-11-10 DIAGNOSIS — F068 Other specified mental disorders due to known physiological condition: Secondary | ICD-10-CM | POA: Insufficient documentation

## 2013-11-10 DIAGNOSIS — E039 Hypothyroidism, unspecified: Secondary | ICD-10-CM | POA: Insufficient documentation

## 2013-11-10 DIAGNOSIS — M129 Arthropathy, unspecified: Secondary | ICD-10-CM | POA: Insufficient documentation

## 2013-11-10 DIAGNOSIS — R011 Cardiac murmur, unspecified: Secondary | ICD-10-CM | POA: Insufficient documentation

## 2013-11-10 DIAGNOSIS — H409 Unspecified glaucoma: Secondary | ICD-10-CM | POA: Insufficient documentation

## 2013-11-10 DIAGNOSIS — E1142 Type 2 diabetes mellitus with diabetic polyneuropathy: Secondary | ICD-10-CM | POA: Insufficient documentation

## 2013-11-10 DIAGNOSIS — N361 Urethral diverticulum: Secondary | ICD-10-CM | POA: Insufficient documentation

## 2013-11-10 DIAGNOSIS — N189 Chronic kidney disease, unspecified: Secondary | ICD-10-CM | POA: Insufficient documentation

## 2013-11-10 DIAGNOSIS — R739 Hyperglycemia, unspecified: Secondary | ICD-10-CM

## 2013-11-10 DIAGNOSIS — Z87891 Personal history of nicotine dependence: Secondary | ICD-10-CM | POA: Insufficient documentation

## 2013-11-10 DIAGNOSIS — L608 Other nail disorders: Secondary | ICD-10-CM | POA: Insufficient documentation

## 2013-11-10 DIAGNOSIS — F0390 Unspecified dementia without behavioral disturbance: Secondary | ICD-10-CM

## 2013-11-10 DIAGNOSIS — N289 Disorder of kidney and ureter, unspecified: Secondary | ICD-10-CM

## 2013-11-10 DIAGNOSIS — Z885 Allergy status to narcotic agent status: Secondary | ICD-10-CM | POA: Insufficient documentation

## 2013-11-10 DIAGNOSIS — Z888 Allergy status to other drugs, medicaments and biological substances status: Secondary | ICD-10-CM | POA: Insufficient documentation

## 2013-11-10 DIAGNOSIS — Z880 Allergy status to penicillin: Secondary | ICD-10-CM | POA: Insufficient documentation

## 2013-11-10 DIAGNOSIS — I129 Hypertensive chronic kidney disease with stage 1 through stage 4 chronic kidney disease, or unspecified chronic kidney disease: Secondary | ICD-10-CM | POA: Insufficient documentation

## 2013-11-10 DIAGNOSIS — Z794 Long term (current) use of insulin: Secondary | ICD-10-CM | POA: Insufficient documentation

## 2013-11-10 DIAGNOSIS — Z87448 Personal history of other diseases of urinary system: Secondary | ICD-10-CM | POA: Insufficient documentation

## 2013-11-10 LAB — CBC WITH DIFFERENTIAL/PLATELET
BASOS ABS: 0 10*3/uL (ref 0.0–0.1)
Basophils Relative: 0 % (ref 0–1)
Eosinophils Absolute: 0 10*3/uL (ref 0.0–0.7)
Eosinophils Relative: 0 % (ref 0–5)
HCT: 35.9 % — ABNORMAL LOW (ref 36.0–46.0)
HEMOGLOBIN: 11.3 g/dL — AB (ref 12.0–15.0)
Lymphocytes Relative: 5 % — ABNORMAL LOW (ref 12–46)
Lymphs Abs: 0.2 10*3/uL — ABNORMAL LOW (ref 0.7–4.0)
MCH: 27.1 pg (ref 26.0–34.0)
MCHC: 31.5 g/dL (ref 30.0–36.0)
MCV: 86.1 fL (ref 78.0–100.0)
Monocytes Absolute: 0.2 10*3/uL (ref 0.1–1.0)
Monocytes Relative: 4 % (ref 3–12)
NEUTROS ABS: 4.4 10*3/uL (ref 1.7–7.7)
Neutrophils Relative %: 91 % — ABNORMAL HIGH (ref 43–77)
PLATELETS: 246 10*3/uL (ref 150–400)
RBC: 4.17 MIL/uL (ref 3.87–5.11)
RDW: 17.4 % — ABNORMAL HIGH (ref 11.5–15.5)
WBC: 4.8 10*3/uL (ref 4.0–10.5)

## 2013-11-10 LAB — BASIC METABOLIC PANEL
BUN: 48 mg/dL — ABNORMAL HIGH (ref 6–23)
CHLORIDE: 97 meq/L (ref 96–112)
CO2: 30 mEq/L (ref 19–32)
Calcium: 10.2 mg/dL (ref 8.4–10.5)
Creatinine, Ser: 1.21 mg/dL — ABNORMAL HIGH (ref 0.50–1.10)
GFR calc non Af Amer: 43 mL/min — ABNORMAL LOW (ref >90)
GFR, EST AFRICAN AMERICAN: 50 mL/min — AB (ref >90)
Glucose, Bld: 255 mg/dL — ABNORMAL HIGH (ref 70–99)
Potassium: 3.6 mEq/L — ABNORMAL LOW (ref 3.7–5.3)
Sodium: 142 mEq/L (ref 137–147)

## 2013-11-10 LAB — URINALYSIS, ROUTINE W REFLEX MICROSCOPIC
Bilirubin Urine: NEGATIVE
Glucose, UA: >1000 mg/dL — AB
Hgb urine dipstick: NEGATIVE
KETONES UR: NEGATIVE mg/dL
LEUKOCYTES UA: NEGATIVE
NITRITE: NEGATIVE
PH: 5.5 (ref 5.0–8.0)
Protein, ur: NEGATIVE mg/dL
Specific Gravity, Urine: 1.018 (ref 1.005–1.030)
Urobilinogen, UA: 0.2 mg/dL (ref 0.0–1.0)

## 2013-11-10 LAB — URINE MICROSCOPIC-ADD ON

## 2013-11-10 MED ORDER — SODIUM CHLORIDE 0.9 % IV BOLUS (SEPSIS)
1000.0000 mL | Freq: Once | INTRAVENOUS | Status: AC
  Administered 2013-11-10: 1000 mL via INTRAVENOUS

## 2013-11-10 NOTE — Discharge Instructions (Signed)
Dementia Dementia is a general term for problems with brain function. A person with dementia has memory loss and a hard time with at least one other brain function such as thinking, speaking, or problem solving. Dementia can affect social functioning, how you do your job, your mood, or your personality. The changes may be hidden for a long time. The earliest forms of this disease are usually not detected by family or friends. Dementia can be:  Irreversible.  Potentially reversible.  Partially reversible.  Progressive. This means it can get worse over time. CAUSES  Irreversible dementia causes may include:  Degeneration of brain cells (Alzheimer's disease or lewy body dementia).  Multiple small strokes (vascular dementia).  Infection (chronic meningitis or Creutzfelt-Jakob disease).  Frontotemporal dementia. This affects younger people, age 40 to 70, compared to those who have Alzheimer's disease.  Dementia associated with other disorders like Parkinson's disease, Huntington's disease, or HIV-associated dementia. Potentially or partially reversible dementia causes may include:  Medicines.  Metabolic causes such as excessive alcohol intake, vitamin B12 deficiency, or thyroid disease.  Masses or pressure in the brain such as a tumor, blood clot, or hydrocephalus. SYMPTOMS  Symptoms are often hard to detect. Family members or coworkers may not notice them early in the disease process. Different people with dementia may have different symptoms. Symptoms can include:  A hard time with memory, especially recent memory. Long-term memory may not be impaired.  Asking the same question multiple times or forgetting something someone just said.  A hard time speaking your thoughts or finding certain words.  A hard time solving problems or performing familiar tasks (such as how to use a telephone).  Sudden changes in mood.  Changes in personality, especially increasing moodiness or  mistrust.  Depression.  A hard time understanding complex ideas that were never a problem in the past. DIAGNOSIS  There are no specific tests for dementia.   Your caregiver may recommend a thorough evaluation. This is because some forms of dementia can be reversible. The evaluation will likely include a physical exam and getting a detailed history from you and a family member. The history often gives the best clues and suggestions for a diagnosis.  Memory testing may be done. A detailed brain function evaluation called neuropsychologic testing may be helpful.  Lab tests and brain imaging (such as a CT scan or MRI scan) are sometimes important.  Sometimes observation and re-evaluation over time is very helpful. TREATMENT  Treatment depends on the cause.   If the problem is a vitamin deficiency, it may be helped or cured with supplements.  For dementias such as Alzheimer's disease, medicines are available to stabilize or slow the course of the disease. There are no cures for this type of dementia.  Your caregiver can help direct you to groups, organizations, and other caregivers to help with decisions in the care of you or your loved one. HOME CARE INSTRUCTIONS The care of individuals with dementia is varied and dependent upon the progression of the dementia. The following suggestions are intended for the person living with, or caring for, the person with dementia.  Create a safe environment.  Remove the locks on bathroom doors to prevent the person from accidentally locking himself or herself in.  Use childproof latches on kitchen cabinets and any place where cleaning supplies, chemicals, or alcohol are kept.  Use childproof covers in unused electrical outlets.  Install childproof devices to keep doors and windows secured.  Remove stove knobs or install safety   knobs and an automatic shut-off on the stove.  Lower the temperature on water heaters.  Label medicines and keep them  locked up.  Secure knives, lighters, matches, power tools, and guns, and keep these items out of reach.  Keep the house free from clutter. Remove rugs or anything that might contribute to a fall.  Remove objects that might break and hurt the person.  Make sure lighting is good, both inside and outside.  Install grab rails as needed.  Use a monitoring device to alert you to falls or other needs for help.  Reduce confusion.  Keep familiar objects and people around.  Use night lights or dim lights at night.  Label items or areas.  Use reminders, notes, or directions for daily activities or tasks.  Keep a simple, consistent routine for waking, meals, bathing, dressing, and bedtime.  Create a calm, quiet environment.  Place large clocks and calendars prominently.  Display emergency numbers and home address near all telephones.  Use cues to establish different times of the day. An example is to open curtains to let the natural light in during the day.   Use effective communication.  Choose simple words and short sentences.  Use a gentle, calm tone of voice.  Be careful not to interrupt.  If the person is struggling to find a word or communicate a thought, try to provide the word or thought.  Ask one question at a time. Allow the person ample time to answer questions. Repeat the question again if the person does not respond.  Reduce nighttime restlessness.  Provide a comfortable bed.  Have a consistent nighttime routine.  Ensure a regular walking or physical activity schedule. Involve the person in daily activities as much as possible.  Limit napping during the day.  Limit caffeine.  Attend social events that stimulate rather than overwhelm the senses.  Encourage good nutrition and hydration.  Reduce distractions during meal times and snacks.  Avoid foods that are too hot or too cold.  Monitor chewing and swallowing ability.  Continue with routine vision,  hearing, dental, and medical screenings.  Only give over-the-counter or prescription medicines as directed by the caregiver.  Monitor driving abilities. Do not allow the person to drive when safe driving is no longer possible.  Register with an identification program which could provide location assistance in the event of a missing person situation. SEEK MEDICAL CARE IF:   New behavioral problems start such as moodiness, aggressiveness, or seeing things that are not there (hallucinations).  Any new problem with brain function happens. This includes problems with balance, speech, or falling a lot.  Problems with swallowing develop.  Any symptoms of other illness happen. Small changes or worsening in any aspect of brain function can be a sign that the illness is getting worse. It can also be a sign of another medical illness such as infection. Seeing a caregiver right away is important. SEEK IMMEDIATE MEDICAL CARE IF:   A fever develops.  New or worsened confusion develops.  New or worsened sleepiness develops.  Staying awake becomes hard to do. Document Released: 04/02/2001 Document Revised: 12/30/2011 Document Reviewed: 03/04/2011 [employer] Patient Information 2014 [employer].  

## 2013-11-10 NOTE — ED Notes (Signed)
[facility] contacted for transport of patient back to home. Pt's husband leaving at this time to meet [facility] at home. Husband comfortable with d/c and f/u instructions.

## 2013-11-10 NOTE — ED Notes (Signed)
Phlebotomy at bedside.

## 2013-11-10 NOTE — ED Notes (Addendum)
Pt presents from home via [facility] with c/o altered mental status. Per EMS family reports pt has hx of frequent UTI and dementia. Family told EMS that pt is more lethargic than normal today.  Pt is alert and responding to her name, speech is occasional and is incomprehensible. Pt's Husband reports pt does not usually speak - will "make one or two sentences sometimes"; husband reports pt spit her medications out today and was asking for her parents earlier today.

## 2013-11-10 NOTE — ED Provider Notes (Signed)
CSN: [id number]     Arrival date & time 11/10/13  1440 History   First MD Initiated Contact with Patient 11/10/13 1440     Chief Complaint  Patient presents with  . Altered Mental Status   (Consider location/radiation/quality/duration/timing/severity/associated sxs/prior Treatment) HPI Comments: Spouse states that pt spit out her medication this morning and she was asking for her parents this morning. Denies fever, vomiting, has had 2 episodes of diarrhea. Husband states that she is at her baseline of minimal talking:husband states that she is like this when she has uti:she stopped macrobid in the last couple of days  The history is provided by the spouse. No language interpreter was used.    Past Medical History  Diagnosis Date  . Diabetes mellitus   . Diabetic neuropathy   . Hypothyroidism   . Hypertension   . GERD (gastroesophageal reflux disease)   . Heart murmur     not treated for  . Arthritis   . Chronic kidney disease     RENAL TRANSPLANT IN 2008--PT IS FOLLOWED BY DR. Karie Fetch WITH BUN OF 37 AND CREAT 1.7--PER NOTE DR. NESI FROM 04/11/12  . Urethral diverticulum   . Eyesight diminished     GLAUCOMA   . History of shingles     LEFT EYE JAN 2012--STILL HAS EYE PAIN-STATES NOT ABLE TO TAKE THE MEDICATION FOR SHINGLES BECAUSE OF HER HX OF KIDNEY TRANSPLANT  . Fingernail abnormalities     FUNGUS OF FINGERNAILS  . UTI (urinary tract infection) 05/18/2013  . UTI (lower urinary tract infection)    Past Surgical History  Procedure Laterality Date  . Kidney transplant  2008    At [facility]  . Back surgery      ruptured disk  . Abdominal hysterectomy    . Appendectomy    . Laser surgery of both eyes for hemorrhages    . 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 Urethral diverticulectomy  04/28/2012    Procedure: URETHRAL DIVERTICULECTOMY;  Surgeon: Antony Haste, MD;  Location: [facility];  Service: Urology;  Laterality: N/A;   Family History  Problem  Relation Age of Onset  . Anesthesia problems Neg Hx    History  Substance Use Topics  . Smoking status: Former Smoker -- 0.50 packs/day for 20 years    Quit date: 10/21/1978  . Smokeless tobacco: Never Used  . Alcohol Use: No   OB History   Grav Para Term Preterm Abortions TAB SAB Ect Mult Living                 Review of Systems  Unable to perform ROS: Dementia    Allergies  Adhesive; Aspirin; Hydrocodone; Iohexol; Oxycodone; Penicillins; Tacrolimus; and Contrast media  Home Medications   Current Outpatient Rx  Name  Route  Sig  Dispense  Refill  . allopurinol (ZYLOPRIM) 300 MG tablet   Oral   Take 150 mg by mouth daily.         Marland Kitchen amLODipine (NORVASC) 10 MG tablet   Oral   Take 1 tablet (10 mg total) by mouth daily with breakfast.   30 tablet   0   . calcitRIOL (ROCALTROL) 0.5 MCG capsule   Oral   Take 1 capsule (0.5 mcg total) by mouth daily with breakfast.   30 capsule   0   . fentaNYL (DURAGESIC - DOSED MCG/HR) 25 MCG/HR patch   Transdermal   Place 1 patch (25 mcg total) onto the skin See admin  instructions.   5 patch   0   . furosemide (LASIX) 40 MG tablet   Oral   Take 80 mg by mouth 2 (two) times daily.         Marland Kitchen. gabapentin (NEURONTIN) 300 MG capsule   Oral   Take 600 mg by mouth 2 (two) times daily.          . insulin detemir (LEVEMIR) 100 UNIT/ML injection   Subcutaneous   Inject 18 Units into the skin every morning.         . insulin lispro (HUMALOG) 100 UNIT/ML injection   Subcutaneous   Inject 0-4 Units into the skin 3 (three) times daily before meals. Per sliding scale         . labetalol (NORMODYNE) 200 MG tablet   Oral   Take 1.5 tablets (300 mg total) by mouth 3 (three) times daily. Takes 1 and 1/2 tabs   120 tablet   0   . latanoprost (XALATAN) 0.005 % ophthalmic solution   Both Eyes   Place 1 drop into both eyes at bedtime.         Marland Kitchen. levothyroxine (SYNTHROID, LEVOTHROID) 112 MCG tablet   Oral   Take 2 tablets  (224 mcg total) by mouth daily with breakfast.   60 tablet   0   . mycophenolate (CELLCEPT) 250 MG capsule   Oral   Take 2 capsules (500 mg total) by mouth 2 (two) times daily.   120 capsule   0   . potassium chloride SA (K-DUR,KLOR-CON) 20 MEQ tablet   Oral   Take 1 tablet (20 mEq total) by mouth daily.   14 tablet   0   . predniSONE (DELTASONE) 10 MG tablet   Oral   Take 10 mg by mouth daily with breakfast.         . ranitidine (ZANTAC) 150 MG tablet   Oral   Take 1 tablet (150 mg total) by mouth daily.   30 tablet   0   . sirolimus (RAPAMUNE) 1 MG tablet   Oral   Take 3 tablets (3 mg total) by mouth daily. Pt takes three 1 mg tablets = 3 mg daily   90 tablet   0    BP 105/68  Pulse 77  Temp(Src) 99.2 F (37.3 C) (Rectal)  Resp 18  SpO2 100% Physical Exam  Constitutional: She appears well-developed and well-nourished.  HENT:  Head: Normocephalic and atraumatic.  Eyes:  Left periorbital swelling  Cardiovascular: Normal rate.   Pulmonary/Chest: Effort normal and breath sounds normal.  Abdominal: Soft. There is no tenderness.  Neurological: She is alert.  Skin: Skin is warm and dry.    ED Course  Procedures (including critical care time) Labs Review Labs Reviewed  URINALYSIS, ROUTINE W REFLEX MICROSCOPIC - Abnormal; Notable for the following:    Glucose, UA >1000 (*)    All other components within normal limits  CBC WITH DIFFERENTIAL - Abnormal; Notable for the following:    Hemoglobin 11.3 (*)    HCT 35.9 (*)    RDW 17.4 (*)    Neutrophils Relative % 91 (*)    Lymphocytes Relative 5 (*)    Lymphs Abs 0.2 (*)    All other components within normal limits  BASIC METABOLIC PANEL - Abnormal; Notable for the following:    Potassium 3.6 (*)    Glucose, Bld 255 (*)    BUN 48 (*)    Creatinine, Ser 1.21 (*)  GFR calc non Af Amer 43 (*)    GFR calc Af Amer 50 (*)    All other components within normal limits  URINE MICROSCOPIC-ADD ON   Imaging  Review Dg Chest 1 View  11/10/2013   CLINICAL DATA:  Confusion.  Hypertension.  EXAM: CHEST - 1 VIEW  COMPARISON:  09/30/2013  FINDINGS: Study is suboptimal being tilted and rotated to the right. Allowing for this, there is no convincing lung consolidation or edema. No pleural effusion or pneumothorax is seen. Cardiac silhouette is mildly enlarged. The bony thorax is diffusely demineralized but grossly intact.  IMPRESSION: No acute cardiopulmonary disease.   Electronically Signed   By: Amie Portland M.D.   On: 11/10/2013 16:13   Ct Head Wo Contrast  11/10/2013   CLINICAL DATA:  Altered mental status  EXAM: CT HEAD WITHOUT CONTRAST  TECHNIQUE: Contiguous axial images were obtained from the base of the skull through the vertex without intravenous contrast.  COMPARISON:  08/10/2013  FINDINGS: Faint calcifications in the globus pallidus nuclei bilaterally, a chronic and likely physiologic finding.  Otherwise, the brainstem, cerebellum, cerebral peduncles, thalamus, basal ganglia, basilar cisterns, and ventricular system appear within normal limits. Periventricular white matter and corona radiata hypodensities favor chronic ischemic microvascular white matter disease. No intracranial hemorrhage, mass lesion, or acute CVA. Atherosclerotic calcification of the carotid siphons noted.  There is a moderate amount of chronic ethmoid sinusitis, similar to prior.  IMPRESSION: 1. Chronic ethmoid sinusitis. 2. No acute intracranial findings. 3. Periventricular white matter and corona radiata hypodensities favor chronic ischemic microvascular white matter disease.   Electronically Signed   By: Herbie Baltimore M.D.   On: 11/10/2013 18:36    EKG Interpretation    Date/Time:  Wednesday November 10 2013 15:13:18 EST Ventricular Rate:  82 PR Interval:  180 QRS Duration: 111 QT Interval:  411 QTC Calculation: 480 R Axis:   -38 Text Interpretation:  Sinus rhythm LVH with secondary repolarization abnormality Probable  anterior infarct, old Confirmed by HORTON  MD, COURTNEY ([id number]) on 11/10/2013 5:33:51 PM            MDM   1. Dementia   2. Hyperglycemia   3. Renal insufficiency    Pt is to be transferred back home with husband:pt is to go by ptar:no infection noted:ct consistent with medical history    Teressa Lower, NP 11/10/13 [phone]

## 2013-11-10 NOTE — ED Notes (Signed)
Deliah BostonVrinda PA at bedside

## 2013-11-11 NOTE — ED Provider Notes (Signed)
Medical screening examination/treatment/procedure(s) were performed by non-physician practitioner and as supervising physician I was immediately available for consultation/collaboration.  EKG Interpretation    Date/Time:  Wednesday November 10 2013 15:13:18 EST Ventricular Rate:  82 PR Interval:  180 QRS Duration: 111 QT Interval:  411 QTC Calculation: 480 R Axis:   -38 Text Interpretation:  Sinus rhythm LVH with secondary repolarization abnormality Probable anterior infarct, old Confirmed by HORTON  MD, COURTNEY ([id number]) on 11/10/2013 5:33:51 PM              Shon Batonourtney F Horton, MD 11/11/13 [phone]

## 2013-11-22 ENCOUNTER — Inpatient Hospital Stay (HOSPITAL_COMMUNITY)
Admission: EM | Admit: 2013-11-22 | Discharge: 2013-11-25 | DRG: 689 | Disposition: A | Discharge status: Home Health | Payer: Medicare Other | Attending: Endocrinology | Admitting: Endocrinology

## 2013-11-22 ENCOUNTER — Emergency Department (HOSPITAL_COMMUNITY): Payer: Medicare Other

## 2013-11-22 ENCOUNTER — Encounter (HOSPITAL_COMMUNITY): Payer: Self-pay | Admitting: Emergency Medicine

## 2013-11-22 DIAGNOSIS — D649 Anemia, unspecified: Secondary | ICD-10-CM | POA: Diagnosis present

## 2013-11-22 DIAGNOSIS — N390 Urinary tract infection, site not specified: Secondary | ICD-10-CM | POA: Diagnosis present

## 2013-11-22 DIAGNOSIS — E1149 Type 2 diabetes mellitus with other diabetic neurological complication: Secondary | ICD-10-CM | POA: Diagnosis present

## 2013-11-22 DIAGNOSIS — R64 Cachexia: Secondary | ICD-10-CM | POA: Diagnosis present

## 2013-11-22 DIAGNOSIS — B961 Klebsiella pneumoniae [K. pneumoniae] as the cause of diseases classified elsewhere: Secondary | ICD-10-CM | POA: Diagnosis present

## 2013-11-22 DIAGNOSIS — F3289 Other specified depressive episodes: Secondary | ICD-10-CM | POA: Diagnosis present

## 2013-11-22 DIAGNOSIS — E1142 Type 2 diabetes mellitus with diabetic polyneuropathy: Secondary | ICD-10-CM | POA: Diagnosis present

## 2013-11-22 DIAGNOSIS — G894 Chronic pain syndrome: Secondary | ICD-10-CM | POA: Diagnosis present

## 2013-11-22 DIAGNOSIS — R509 Fever, unspecified: Secondary | ICD-10-CM | POA: Diagnosis present

## 2013-11-22 DIAGNOSIS — E785 Hyperlipidemia, unspecified: Secondary | ICD-10-CM | POA: Diagnosis present

## 2013-11-22 DIAGNOSIS — Z8249 Family history of ischemic heart disease and other diseases of the circulatory system: Secondary | ICD-10-CM

## 2013-11-22 DIAGNOSIS — N039 Chronic nephritic syndrome with unspecified morphologic changes: Secondary | ICD-10-CM

## 2013-11-22 DIAGNOSIS — K219 Gastro-esophageal reflux disease without esophagitis: Secondary | ICD-10-CM | POA: Diagnosis present

## 2013-11-22 DIAGNOSIS — N183 Chronic kidney disease, stage 3 unspecified: Secondary | ICD-10-CM | POA: Diagnosis present

## 2013-11-22 DIAGNOSIS — Z833 Family history of diabetes mellitus: Secondary | ICD-10-CM

## 2013-11-22 DIAGNOSIS — IMO0002 Reserved for concepts with insufficient information to code with codable children: Secondary | ICD-10-CM

## 2013-11-22 DIAGNOSIS — E1165 Type 2 diabetes mellitus with hyperglycemia: Secondary | ICD-10-CM | POA: Diagnosis present

## 2013-11-22 DIAGNOSIS — N2581 Secondary hyperparathyroidism of renal origin: Secondary | ICD-10-CM | POA: Diagnosis present

## 2013-11-22 DIAGNOSIS — F0391 Unspecified dementia with behavioral disturbance: Secondary | ICD-10-CM | POA: Diagnosis present

## 2013-11-22 DIAGNOSIS — H3500 Unspecified background retinopathy: Secondary | ICD-10-CM | POA: Diagnosis present

## 2013-11-22 DIAGNOSIS — I4460 Unspecified fascicular block: Secondary | ICD-10-CM | POA: Diagnosis present

## 2013-11-22 DIAGNOSIS — E559 Vitamin D deficiency, unspecified: Secondary | ICD-10-CM | POA: Diagnosis present

## 2013-11-22 DIAGNOSIS — E43 Unspecified severe protein-calorie malnutrition: Secondary | ICD-10-CM | POA: Diagnosis present

## 2013-11-22 DIAGNOSIS — M6250 Muscle wasting and atrophy, not elsewhere classified, unspecified site: Secondary | ICD-10-CM | POA: Diagnosis present

## 2013-11-22 DIAGNOSIS — Z87891 Personal history of nicotine dependence: Secondary | ICD-10-CM | POA: Diagnosis not present

## 2013-11-22 DIAGNOSIS — Z8619 Personal history of other infectious and parasitic diseases: Secondary | ICD-10-CM

## 2013-11-22 DIAGNOSIS — L89109 Pressure ulcer of unspecified part of back, unspecified stage: Secondary | ICD-10-CM | POA: Diagnosis present

## 2013-11-22 DIAGNOSIS — F03918 Unspecified dementia, unspecified severity, with other behavioral disturbance: Secondary | ICD-10-CM | POA: Diagnosis present

## 2013-11-22 DIAGNOSIS — Z940 Kidney transplant status: Secondary | ICD-10-CM

## 2013-11-22 DIAGNOSIS — I129 Hypertensive chronic kidney disease with stage 1 through stage 4 chronic kidney disease, or unspecified chronic kidney disease: Secondary | ICD-10-CM | POA: Diagnosis present

## 2013-11-22 DIAGNOSIS — F329 Major depressive disorder, single episode, unspecified: Secondary | ICD-10-CM | POA: Diagnosis present

## 2013-11-22 DIAGNOSIS — E039 Hypothyroidism, unspecified: Secondary | ICD-10-CM | POA: Diagnosis present

## 2013-11-22 DIAGNOSIS — H409 Unspecified glaucoma: Secondary | ICD-10-CM | POA: Diagnosis present

## 2013-11-22 DIAGNOSIS — N189 Chronic kidney disease, unspecified: Secondary | ICD-10-CM | POA: Diagnosis present

## 2013-11-22 DIAGNOSIS — Z79899 Other long term (current) drug therapy: Secondary | ICD-10-CM | POA: Diagnosis not present

## 2013-11-22 DIAGNOSIS — K3184 Gastroparesis: Secondary | ICD-10-CM | POA: Diagnosis present

## 2013-11-22 DIAGNOSIS — L8990 Pressure ulcer of unspecified site, unspecified stage: Secondary | ICD-10-CM | POA: Diagnosis present

## 2013-11-22 DIAGNOSIS — D631 Anemia in chronic kidney disease: Secondary | ICD-10-CM | POA: Diagnosis present

## 2013-11-22 DIAGNOSIS — N289 Disorder of kidney and ureter, unspecified: Secondary | ICD-10-CM

## 2013-11-22 DIAGNOSIS — I10 Essential (primary) hypertension: Secondary | ICD-10-CM | POA: Diagnosis present

## 2013-11-22 LAB — URINALYSIS, ROUTINE W REFLEX MICROSCOPIC
BILIRUBIN URINE: NEGATIVE
Ketones, ur: NEGATIVE mg/dL
Nitrite: NEGATIVE
PH: 5.5 (ref 5.0–8.0)
Protein, ur: 30 mg/dL — AB
Specific Gravity, Urine: 1.016 (ref 1.005–1.030)
Urobilinogen, UA: 0.2 mg/dL (ref 0.0–1.0)

## 2013-11-22 LAB — URINE MICROSCOPIC-ADD ON

## 2013-11-22 LAB — COMPREHENSIVE METABOLIC PANEL
ALBUMIN: 3.2 g/dL — AB (ref 3.5–5.2)
ALT: 12 U/L (ref 0–35)
AST: 12 U/L (ref 0–37)
Alkaline Phosphatase: 52 U/L (ref 39–117)
BILIRUBIN TOTAL: 0.9 mg/dL (ref 0.3–1.2)
BUN: 33 mg/dL — AB (ref 6–23)
CHLORIDE: 97 meq/L (ref 96–112)
CO2: 31 mEq/L (ref 19–32)
Calcium: 10.5 mg/dL (ref 8.4–10.5)
Creatinine, Ser: 1.15 mg/dL — ABNORMAL HIGH (ref 0.50–1.10)
GFR calc Af Amer: 53 mL/min — ABNORMAL LOW (ref >90)
GFR calc non Af Amer: 46 mL/min — ABNORMAL LOW (ref >90)
Glucose, Bld: 265 mg/dL — ABNORMAL HIGH (ref 70–99)
Potassium: 4.4 mEq/L (ref 3.7–5.3)
Sodium: 141 mEq/L (ref 137–147)
Total Protein: 6.5 g/dL (ref 6.0–8.3)

## 2013-11-22 LAB — CBC WITH DIFFERENTIAL/PLATELET
BASOS PCT: 0 % (ref 0–1)
Basophils Absolute: 0 10*3/uL (ref 0.0–0.1)
Eosinophils Absolute: 0 10*3/uL (ref 0.0–0.7)
Eosinophils Relative: 0 % (ref 0–5)
HCT: 36.9 % (ref 36.0–46.0)
HEMOGLOBIN: 11.9 g/dL — AB (ref 12.0–15.0)
Lymphocytes Relative: 9 % — ABNORMAL LOW (ref 12–46)
Lymphs Abs: 0.8 10*3/uL (ref 0.7–4.0)
MCH: 27.4 pg (ref 26.0–34.0)
MCHC: 32.2 g/dL (ref 30.0–36.0)
MCV: 84.8 fL (ref 78.0–100.0)
MONOS PCT: 4 % (ref 3–12)
Monocytes Absolute: 0.4 10*3/uL (ref 0.1–1.0)
NEUTROS ABS: 8.3 10*3/uL — AB (ref 1.7–7.7)
NEUTROS PCT: 87 % — AB (ref 43–77)
Platelets: 214 10*3/uL (ref 150–400)
RBC: 4.35 MIL/uL (ref 3.87–5.11)
RDW: 18.1 % — ABNORMAL HIGH (ref 11.5–15.5)
WBC: 9.5 10*3/uL (ref 4.0–10.5)

## 2013-11-22 LAB — GLUCOSE, CAPILLARY
GLUCOSE-CAPILLARY: 295 mg/dL — AB (ref 70–99)
Glucose-Capillary: 290 mg/dL — ABNORMAL HIGH (ref 70–99)
Glucose-Capillary: 277 mg/dL — ABNORMAL HIGH (ref 70–99)
Glucose-Capillary: 191 mg/dL — ABNORMAL HIGH (ref 70–99)

## 2013-11-22 LAB — HEMOGLOBIN A1C
Hgb A1c MFr Bld: 8.3 % — ABNORMAL HIGH (ref <5.7)
MEAN PLASMA GLUCOSE: 192 mg/dL — AB (ref <117)

## 2013-11-22 LAB — LACTIC ACID, PLASMA: Lactic Acid, Venous: 1.3 mmol/L (ref 0.5–2.2)

## 2013-11-22 LAB — TROPONIN I: Troponin I: <0.3 ng/mL (ref <0.30)

## 2013-11-22 MED ORDER — SIROLIMUS 1 MG PO TABS
3.0000 mg | ORAL_TABLET | Freq: Every day | ORAL | Status: DC
  Administered 2013-11-24 – 2013-11-25 (×2): 3 mg via ORAL
  Filled 2013-11-22 (×7): qty 3

## 2013-11-22 MED ORDER — INSULIN DETEMIR 100 UNIT/ML ~~LOC~~ SOLN
10.0000 [IU] | Freq: Every day | SUBCUTANEOUS | Status: DC
  Filled 2013-11-22 (×2): qty 0.1

## 2013-11-22 MED ORDER — MYCOPHENOLATE MOFETIL 250 MG PO CAPS
500.0000 mg | ORAL_CAPSULE | Freq: Two times a day (BID) | ORAL | Status: DC
  Administered 2013-11-24 – 2013-11-25 (×2): 500 mg via ORAL
  Filled 2013-11-22 (×7): qty 2

## 2013-11-22 MED ORDER — SODIUM CHLORIDE 0.9 % IV BOLUS (SEPSIS)
1000.0000 mL | Freq: Once | INTRAVENOUS | Status: AC
  Administered 2013-11-22: 1000 mL via INTRAVENOUS

## 2013-11-22 MED ORDER — PREDNISONE 10 MG PO TABS
10.0000 mg | ORAL_TABLET | Freq: Every day | ORAL | Status: DC
  Administered 2013-11-24 – 2013-11-25 (×2): 10 mg via ORAL
  Filled 2013-11-22 (×6): qty 1

## 2013-11-22 MED ORDER — BIOTENE DRY MOUTH MT LIQD
15.0000 mL | Freq: Two times a day (BID) | OROMUCOSAL | Status: DC
  Administered 2013-11-22 – 2013-11-25 (×4): 15 mL via OROMUCOSAL

## 2013-11-22 MED ORDER — LABETALOL HCL 300 MG PO TABS
300.0000 mg | ORAL_TABLET | Freq: Three times a day (TID) | ORAL | Status: DC
  Administered 2013-11-24 – 2013-11-25 (×3): 300 mg via ORAL
  Filled 2013-11-22 (×11): qty 1

## 2013-11-22 MED ORDER — VITAMIN D (ERGOCALCIFEROL) 1.25 MG (50000 UNIT) PO CAPS
50000.0000 [IU] | ORAL_CAPSULE | Freq: Every day | ORAL | Status: DC
  Administered 2013-11-24 – 2013-11-25 (×2): 50000 [IU] via ORAL
  Filled 2013-11-22 (×4): qty 1

## 2013-11-22 MED ORDER — AMLODIPINE BESYLATE 10 MG PO TABS
10.0000 mg | ORAL_TABLET | Freq: Every day | ORAL | Status: DC
  Administered 2013-11-24 – 2013-11-25 (×2): 10 mg via ORAL
  Filled 2013-11-22 (×4): qty 1

## 2013-11-22 MED ORDER — CIPROFLOXACIN IN D5W 400 MG/200ML IV SOLN
400.0000 mg | Freq: Two times a day (BID) | INTRAVENOUS | Status: DC
  Administered 2013-11-23 – 2013-11-24 (×4): 400 mg via INTRAVENOUS
  Filled 2013-11-22 (×4): qty 200

## 2013-11-22 MED ORDER — FENTANYL 25 MCG/HR TD PT72
25.0000 ug | MEDICATED_PATCH | TRANSDERMAL | Status: DC
  Administered 2013-11-22: 25 ug via TRANSDERMAL
  Filled 2013-11-22: qty 1

## 2013-11-22 MED ORDER — LATANOPROST 0.005 % OP SOLN
1.0000 [drp] | Freq: Every day | OPHTHALMIC | Status: DC
  Administered 2013-11-23 – 2013-11-24 (×2): 1 [drp] via OPHTHALMIC
  Filled 2013-11-22: qty 2.5

## 2013-11-22 MED ORDER — INSULIN ASPART 100 UNIT/ML ~~LOC~~ SOLN
0.0000 [IU] | Freq: Every day | SUBCUTANEOUS | Status: DC
  Administered 2013-11-23: 2 [IU] via SUBCUTANEOUS
  Administered 2013-11-24: 3 [IU] via SUBCUTANEOUS

## 2013-11-22 MED ORDER — ENOXAPARIN SODIUM 40 MG/0.4ML ~~LOC~~ SOLN
40.0000 mg | SUBCUTANEOUS | Status: DC
  Administered 2013-11-22 – 2013-11-24 (×3): 40 mg via SUBCUTANEOUS
  Filled 2013-11-22 (×5): qty 0.4

## 2013-11-22 MED ORDER — CIPROFLOXACIN IN D5W 400 MG/200ML IV SOLN
400.0000 mg | Freq: Once | INTRAVENOUS | Status: AC
  Administered 2013-11-22: 400 mg via INTRAVENOUS
  Filled 2013-11-22: qty 200

## 2013-11-22 MED ORDER — GABAPENTIN 300 MG PO CAPS
300.0000 mg | ORAL_CAPSULE | Freq: Three times a day (TID) | ORAL | Status: DC
  Administered 2013-11-24 – 2013-11-25 (×3): 300 mg via ORAL
  Filled 2013-11-22 (×11): qty 1

## 2013-11-22 MED ORDER — LEVOTHYROXINE SODIUM 112 MCG PO TABS
224.0000 ug | ORAL_TABLET | Freq: Every day | ORAL | Status: DC
  Administered 2013-11-24 – 2013-11-25 (×2): 224 ug via ORAL
  Filled 2013-11-22 (×4): qty 2

## 2013-11-22 MED ORDER — SODIUM CHLORIDE 0.9 % IV SOLN
INTRAVENOUS | Status: AC
  Administered 2013-11-22 (×2): via INTRAVENOUS

## 2013-11-22 MED ORDER — ALLOPURINOL 150 MG HALF TABLET
150.0000 mg | ORAL_TABLET | Freq: Every day | ORAL | Status: DC
  Administered 2013-11-24 – 2013-11-25 (×2): 150 mg via ORAL
  Filled 2013-11-22 (×4): qty 1

## 2013-11-22 MED ORDER — GABAPENTIN 300 MG PO CAPS
300.0000 mg | ORAL_CAPSULE | Freq: Four times a day (QID) | ORAL | Status: DC
  Filled 2013-11-22 (×2): qty 1

## 2013-11-22 MED ORDER — CALCITRIOL 0.5 MCG PO CAPS
0.5000 ug | ORAL_CAPSULE | Freq: Every day | ORAL | Status: DC
  Administered 2013-11-24 – 2013-11-25 (×2): 0.5 ug via ORAL
  Filled 2013-11-22 (×4): qty 1

## 2013-11-22 MED ORDER — INSULIN ASPART 100 UNIT/ML ~~LOC~~ SOLN
0.0000 [IU] | Freq: Three times a day (TID) | SUBCUTANEOUS | Status: DC
  Administered 2013-11-22 – 2013-11-23 (×2): 5 [IU] via SUBCUTANEOUS
  Administered 2013-11-23: 2 [IU] via SUBCUTANEOUS
  Administered 2013-11-23: 3 [IU] via SUBCUTANEOUS
  Administered 2013-11-24: 2 [IU] via SUBCUTANEOUS
  Administered 2013-11-24: 5 [IU] via SUBCUTANEOUS
  Administered 2013-11-24 – 2013-11-25 (×2): 1 [IU] via SUBCUTANEOUS

## 2013-11-22 MED ORDER — INSULIN DETEMIR 100 UNIT/ML ~~LOC~~ SOLN
16.0000 [IU] | Freq: Every day | SUBCUTANEOUS | Status: DC
  Filled 2013-11-22: qty 0.16

## 2013-11-22 NOTE — ED Notes (Signed)
IV attempt x 2 unsuccessful. Advised preceptor to try to access a site.

## 2013-11-22 NOTE — ED Notes (Signed)
Consulting MD about Portable X-RAY pt unable to stand.

## 2013-11-22 NOTE — ED Provider Notes (Signed)
CSN: [id number]     Arrival date & time 11/22/13  7829 History   First MD Initiated Contact with Patient 11/22/13 [phone]     Chief Complaint  Patient presents with  . Fever   (Consider location/radiation/quality/duration/timing/severity/associated sxs/prior Treatment) HPI Comments: Patient is a 73 year old female with history of dementia, diabetes, frequent UTIs. She is brought today for evaluation of fever that started yesterday. She has been less responsive to the husband is having difficulty keeping liquids and her medications in her mouth. According to the husband, this is how she behaves when she has a urinary tract infection.  This patient is non-ambulatory and for the most part nonverbal. The husband is the primary caregiver and keeps her at home. She does have home health visits regularly and tends to a decubitus ulcer that is on her sacrum.  Patient is a 73 y.o. female presenting with fever. The history is provided by the spouse.  Fever Max temp prior to arrival:  102 Severity:  Moderate Onset quality:  Gradual Duration:  24 hours Timing:  Constant Progression:  Worsening Chronicity:  New Relieved by:  Nothing Worsened by:  Nothing tried Ineffective treatments:  None tried Associated symptoms: chills     Past Medical History  Diagnosis Date  . Diabetes mellitus   . Diabetic neuropathy   . Hypothyroidism   . Hypertension   . GERD (gastroesophageal reflux disease)   . Heart murmur     not treated for  . Arthritis   . Chronic kidney disease     RENAL TRANSPLANT IN 2008--PT IS FOLLOWED BY DR. Karie Fetch WITH BUN OF 37 AND CREAT 1.7--PER NOTE DR. NESI FROM 04/11/12  . Urethral diverticulum   . Eyesight diminished     GLAUCOMA   . History of shingles     LEFT EYE JAN 2012--STILL HAS EYE PAIN-STATES NOT ABLE TO TAKE THE MEDICATION FOR SHINGLES BECAUSE OF HER HX OF KIDNEY TRANSPLANT  . Fingernail abnormalities     FUNGUS OF FINGERNAILS  . UTI (urinary tract infection)  05/18/2013  . UTI (lower urinary tract infection)    Past Surgical History  Procedure Laterality Date  . Kidney transplant  2008    At [facility]  . Back surgery      ruptured disk  . Abdominal hysterectomy    . Appendectomy    . Laser surgery of both eyes for hemorrhages    . 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 Urethral diverticulectomy  04/28/2012    Procedure: URETHRAL DIVERTICULECTOMY;  Surgeon: Antony Haste, MD;  Location: [facility];  Service: Urology;  Laterality: N/A;   Family History  Problem Relation Age of Onset  . Anesthesia problems Neg Hx    History  Substance Use Topics  . Smoking status: Former Smoker -- 0.50 packs/day for 20 years    Quit date: 10/21/1978  . Smokeless tobacco: Never Used  . Alcohol Use: No   OB History   Grav Para Term Preterm Abortions TAB SAB Ect Mult Living                 Review of Systems  Constitutional: Positive for fever and chills.  All other systems reviewed and are negative.    Allergies  Adhesive; Aspirin; Hydrocodone; Iohexol; Oxycodone; Penicillins; Tacrolimus; and Contrast media  Home Medications   Current Outpatient Rx  Name  Route  Sig  Dispense  Refill  . allopurinol (ZYLOPRIM) 300 MG tablet   Oral   Take 150  mg by mouth daily.         Marland Kitchen amLODipine (NORVASC) 10 MG tablet   Oral   Take 1 tablet (10 mg total) by mouth daily with breakfast.   30 tablet   0   . calcitRIOL (ROCALTROL) 0.5 MCG capsule   Oral   Take 1 capsule (0.5 mcg total) by mouth daily with breakfast.   30 capsule   0   . fentaNYL (DURAGESIC - DOSED MCG/HR) 25 MCG/HR patch   Transdermal   Place 1 patch (25 mcg total) onto the skin See admin instructions.   5 patch   0   . furosemide (LASIX) 40 MG tablet   Oral   Take 80 mg by mouth 2 (two) times daily.         Marland Kitchen gabapentin (NEURONTIN) 300 MG capsule   Oral   Take 300 mg by mouth 4 (four) times daily.         . insulin detemir (LEVEMIR) 100 UNIT/ML injection    Subcutaneous   Inject 18 Units into the skin every morning.         . insulin lispro (HUMALOG) 100 UNIT/ML injection   Subcutaneous   Inject 0-4 Units into the skin 3 (three) times daily before meals. Per sliding scale         . labetalol (NORMODYNE) 200 MG tablet   Oral   Take 1.5 tablets (300 mg total) by mouth 3 (three) times daily. Takes 1 and 1/2 tabs   120 tablet   0   . latanoprost (XALATAN) 0.005 % ophthalmic solution   Both Eyes   Place 1 drop into both eyes at bedtime.         Marland Kitchen levothyroxine (SYNTHROID, LEVOTHROID) 112 MCG tablet   Oral   Take 2 tablets (224 mcg total) by mouth daily with breakfast.   60 tablet   0   . loperamide (IMODIUM) 2 MG capsule   Oral   Take 2 mg by mouth as needed for diarrhea or loose stools.         . mycophenolate (CELLCEPT) 250 MG capsule   Oral   Take 2 capsules (500 mg total) by mouth 2 (two) times daily.   120 capsule   0   . nitrofurantoin (MACRODANTIN) 100 MG capsule   Oral   Take 100 mg by mouth 2 (two) times daily.         . potassium chloride SA (K-DUR,KLOR-CON) 20 MEQ tablet   Oral   Take 1 tablet (20 mEq total) by mouth daily.   14 tablet   0   . predniSONE (DELTASONE) 10 MG tablet   Oral   Take 10 mg by mouth daily with breakfast.         . sirolimus (RAPAMUNE) 1 MG tablet   Oral   Take 3 tablets (3 mg total) by mouth daily. Pt takes three 1 mg tablets = 3 mg daily   90 tablet   0   . Vitamin D, Ergocalciferol, (DRISDOL) 50000 UNITS CAPS capsule   Oral   Take 50,000 Units by mouth daily.          BP 124/47  Temp(Src) 102.2 F (39 C) (Rectal)  Resp 26  Ht 5\' 4"  (1.626 m)  Wt 135 lb (61.236 kg)  BMI 23.16 kg/m2  SpO2 96% Physical Exam  Nursing note and vitals reviewed. Constitutional: She appears well-nourished. No distress.  Patient is an elderly female. She is cachectic.  HENT:  Head: Normocephalic and atraumatic.  Mouth/Throat: Oropharynx is clear and moist.  Neck: Normal range  of motion. Neck supple.  Cardiovascular: Normal rate, regular rhythm and normal heart sounds.   No murmur heard. Pulmonary/Chest: Effort normal and breath sounds normal. No respiratory distress. She has no wheezes.  Abdominal: Soft. Bowel sounds are normal. She exhibits no distension. There is no tenderness.  Musculoskeletal: She exhibits no edema.  Lower extremities exhibit atrophy and muscle wasting.  Neurological:  Patient is nonverbal. She is somnolent, but arousable.  Skin: She is not diaphoretic.  There is a small decubitus ulcer present. There is a dressing in place with minimal drainage. There is no significant surrounding erythema.    ED Course  Procedures (including critical care time) Labs Review Labs Reviewed  CULTURE, BLOOD (ROUTINE X 2)  CULTURE, BLOOD (ROUTINE X 2)  LACTIC ACID, PLASMA  CBC WITH DIFFERENTIAL  COMPREHENSIVE METABOLIC PANEL  TROPONIN I  URINALYSIS, ROUTINE W REFLEX MICROSCOPIC   Imaging Review No results found.  EKG Interpretation    Date/Time:  Monday November 22 2013 08:15:09 EST Ventricular Rate:  84 PR Interval:  162 QRS Duration: 108 QT Interval:  404 QTC Calculation: 478 R Axis:   -92 Text Interpretation:  Sinus rhythm Left anterior fascicular block Anterior infarct, old Nonspecific T abnormalities, lateral leads Confirmed by DELOS  MD, Elizibeth Breau ([id number]) on 11/22/2013 8:40:09 AM            MDM  No diagnosis found. Patient with history of renal transplant and advanced dementia. She was brought here for evaluation of fever and change in her behavior. This is how she normally acts when she has a UTI. Patient is febrile to 102 and workup reveals a recurrent urinary tract infection. She was given Cipro and will be admitted to MedSurg. I[facility] to [facility].    Geoffery Lyonsouglas Destinee Taber, MD 11/22/13 1150

## 2013-11-22 NOTE — Progress Notes (Addendum)
Admission note:  Arrival Method: from ED via stretcher Mental Orientation: A&Ox1 self Telemetry: N/A Assessment: See docflowsheets Skin: Stage 2 pressure ulcer sacrum IV: L hand NS@75  Pain: No pain Tubes: N/A Safety Measures: Patient Handbook has been given, and discussed the Fall Prevention worksheet with husband. Left at bedside  Admission: Completed and admission orders have been written  6E Orientation: Patient has been oriented to the unit, staff and to the room.  Family: Husband at the bedside

## 2013-11-22 NOTE — ED Notes (Signed)
Pt stopped eating yesterday, holding food in mouth, not taking pills.   Not been responding to husband well since last night.

## 2013-11-22 NOTE — H&P (Signed)
PCP:   Julian Hy, MD   Chief Complaint:  Altered mental status  HPI: 72YO BF with DM2, renal transplant, dementia and recurrent UTI's. Brought in by her husband for lethargy and suspected recurrent UTI. Lab testing confirms the urine is indeed infected again. Pt had been on prophylactic macrodantin. She is alone in the room and cannot provide other details. ER notes reviewed  Review of Systems:  Review of Systems - unobtainable Past Medical History: Past Medical History  Diagnosis Date  . Diabetes mellitus   . Diabetic neuropathy   . Hypothyroidism   . Hypertension   . GERD (gastroesophageal reflux disease)   . Heart murmur     not treated for  . Arthritis   . Chronic kidney disease     RENAL TRANSPLANT IN 2008--PT IS FOLLOWED BY DR. Karie Fetch WITH BUN OF 37 AND CREAT 1.7--PER NOTE DR. NESI FROM 04/11/12  . Urethral diverticulum   . Eyesight diminished     GLAUCOMA   . History of shingles     LEFT EYE JAN 2012--STILL HAS EYE PAIN-STATES NOT ABLE TO TAKE THE MEDICATION FOR SHINGLES BECAUSE OF HER HX OF KIDNEY TRANSPLANT  . Fingernail abnormalities     FUNGUS OF FINGERNAILS  . UTI (urinary tract infection) 05/18/2013  . 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 Kidney transplant  2008    At [facility]  . Back surgery      ruptured disk  . Abdominal hysterectomy    . Appendectomy    . Laser surgery of both eyes for hemorrhages    . 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 Urethral  diverticulectomy  04/28/2012    Procedure: URETHRAL DIVERTICULECTOMY;  Surgeon: Antony Haste, MD;  Location: [facility];  Service: Urology;  Laterality: N/A;  Surgical History (reviewed - no changes required): renal transplant 2007 parathyroidectomy TAH C spine procedure tonsils CTS cataracts bilateral bladder stone removal 2012 bladder diverticulum repair 2013  Medications: Prior to Admission medications   Medication Sig Start Date End Date Taking? Authorizing Provider  allopurinol (ZYLOPRIM) 300 MG tablet Take 150 mg by mouth daily.   Yes Historical Provider, MD  amLODipine (NORVASC) 10 MG tablet Take 1 tablet (10 mg total) by mouth daily with breakfast. 02/10/13  Yes Claudie Revering, NP  calcitRIOL (ROCALTROL) 0.5 MCG capsule Take 1 capsule (0.5 mcg total) by mouth daily with breakfast. 02/10/13  Yes Claudie Revering, NP  fentaNYL (DURAGESIC - DOSED MCG/HR) 25 MCG/HR patch Place 1 patch (25 mcg total) onto the skin See admin instructions. 10/07/13  Yes Julian Hy, MD  furosemide (LASIX) 40 MG tablet Take 80 mg by mouth 2 (two) times daily.   Yes Historical Provider, MD  gabapentin (NEURONTIN) 300 MG capsule Take 300 mg by mouth 4 (four) times daily.   Yes Historical Provider, MD  insulin detemir (LEVEMIR) 100 UNIT/ML injection Inject 18 Units into the skin every morning. 10/07/13  Yes Julian Hy, MD  insulin lispro (HUMALOG) 100 UNIT/ML injection Inject 0-4 Units into the skin 3 (three) times daily before meals. Per sliding scale   Yes  Historical Provider, MD  labetalol (NORMODYNE) 200 MG tablet Take 1.5 tablets (300 mg total) by mouth 3 (three) times daily. Takes 1 and 1/2 tabs 02/10/13  Yes Claudie Revering, NP  latanoprost (XALATAN) 0.005 % ophthalmic solution Place 1 drop into both eyes at bedtime.   Yes Historical Provider, MD  levothyroxine (SYNTHROID, LEVOTHROID) 112 MCG tablet Take 2 tablets (224 mcg total) by mouth daily with breakfast. 02/10/13  Yes Claudie Revering, NP  loperamide (IMODIUM) 2 MG capsule Take 2 mg by mouth as needed for diarrhea or loose stools.   Yes Historical Provider, MD  mycophenolate (CELLCEPT) 250 MG capsule Take 2 capsules (500 mg total) by mouth 2 (two) times daily. 02/10/13  Yes Claudie Revering, NP  nitrofurantoin (MACRODANTIN) 100 MG capsule Take 100 mg by mouth 2 (two) times daily.   Yes Historical Provider, MD  potassium chloride SA (K-DUR,KLOR-CON) 20 MEQ tablet Take 1 tablet (20 mEq total) by mouth daily. 05/24/13  Yes Julian Hy, MD  predniSONE (DELTASONE) 10 MG tablet Take 10 mg by mouth daily with breakfast.   Yes Historical Provider, MD  sirolimus (RAPAMUNE) 1 MG tablet Take 3 tablets (3 mg total) by mouth daily. Pt takes three 1 mg tablets = 3 mg daily 02/10/13  Yes Claudie Revering, NP  Vitamin D, Ergocalciferol, (DRISDOL) 50000 UNITS CAPS capsule Take 50,000 Units by mouth daily.   Yes Historical Provider, MD    Allergies:   Allergies  Allergen Reactions  . Adhesive [Tape]     Skin irritation, "pulls the skin off"   . Aspirin     Crawling feeling under skin  . Hydrocodone Other (See Comments)    Hallucinations   . Iohexol      Desc: pt unable to tell what the reaction was   . Oxycodone     hallucinations  . Penicillins Hives and Swelling    Has tolerated cefazolin and ceftriaxone.  . Tacrolimus Other (See Comments)    Hallucinations   . Contrast Media [Iodinated Diagnostic Agents] Hives, Swelling and Rash    Social History:  reports that she quit smoking about 35 years ago. She has never used smokeless tobacco. She reports that she does not drink alcohol or use illicit drugs. Social History (reviewed - no changes required): m 1962  Family History: Family History  Problem Relation Age of Onset  . Anesthesia problems Neg Hx    Family History (reviewed - no changes required): Dad died with CA, 89 Mom died with DM 49 Brother died with CAD 23 Physical Exam: Filed Vitals:   11/22/13 1156  11/22/13 1200 11/22/13 1415 11/22/13 1506  BP: 129/48 138/45 145/71 124/46  Pulse: 81 79 83 80  Temp:    97.7 F (36.5 C)  TempSrc:    Axillary  Resp: 23 24 18 18   Height:      Weight:      SpO2: 92% 89% 96% 95%   General appearance: frail chronically ill BF. Some left eye swelling. anicteric  oral membranes sl dry Neck: no adenopathy, no carotid bruit, no JVD and thyroid not enlarged, symmetric, no tenderness/mass/nodules Resp: clear with no wheeze, no accessory ms in use Cardio: regular with skips and murmur GI: healed scars, ventral hernia, soft, non-tender; bowel sounds hypoactive Extremities: extremities normal, atraumatic, no cyanosis or edema Pulses: sl reduced, 1+ edema  Neurologic: will open eyes briefly and mumble. Some tremors of left shoulder.      Labs on Admission:  Recent Labs  11/22/13 0835  NA 141  K 4.4  CL 97  CO2 31  GLUCOSE 265*  BUN 33*  CREATININE 1.15*  CALCIUM 10.5    Recent Labs  11/22/13 0835  AST 12  ALT 12  ALKPHOS 52  BILITOT 0.9  PROT 6.5  ALBUMIN 3.2*     Recent Labs  11/22/13 0835  WBC 9.5  NEUTROABS 8.3*  HGB 11.9*  HCT 36.9  MCV 84.8  PLT 214    Recent Labs  11/22/13 0835  TROPONINI <0.30       Radiological Exams on Admission: Dg Chest 1 View  11/10/2013   CLINICAL DATA:  Confusion.  Hypertension.  EXAM: CHEST - 1 VIEW  COMPARISON:  09/30/2013  FINDINGS: Study is suboptimal being tilted and rotated to the right. Allowing for this, there is no convincing lung consolidation or edema. No pleural effusion or pneumothorax is seen. Cardiac silhouette is mildly enlarged. The bony thorax is diffusely demineralized but grossly intact.  IMPRESSION: No acute cardiopulmonary disease.   Electronically Signed   By: Amie Portlandavid  Ormond M.D.   On: 11/10/2013 16:13   Ct Head Wo Contrast  11/10/2013   CLINICAL DATA:  Altered mental status  EXAM: CT HEAD WITHOUT CONTRAST  TECHNIQUE: Contiguous axial images were obtained from the  base of the skull through the vertex without intravenous contrast.  COMPARISON:  08/10/2013  FINDINGS: Faint calcifications in the globus pallidus nuclei bilaterally, a chronic and likely physiologic finding.  Otherwise, the brainstem, cerebellum, cerebral peduncles, thalamus, basal ganglia, basilar cisterns, and ventricular system appear within normal limits. Periventricular white matter and corona radiata hypodensities favor chronic ischemic microvascular white matter disease. No intracranial hemorrhage, mass lesion, or acute CVA. Atherosclerotic calcification of the carotid siphons noted.  There is a moderate amount of chronic ethmoid sinusitis, similar to prior.  IMPRESSION: 1. Chronic ethmoid sinusitis. 2. No acute intracranial findings. 3. Periventricular white matter and corona radiata hypodensities favor chronic ischemic microvascular white matter disease.   Electronically Signed   By: Herbie BaltimoreWalt  Liebkemann M.D.   On: 11/10/2013 18:36   Dg Chest Port 1 View  11/22/2013   CLINICAL DATA:  Fever.  Weakness.  EXAM: PORTABLE CHEST - 1 VIEW  COMPARISON:  DG CHEST 1 VIEW dated 11/10/2013; DG CHEST 1V PORT dated 09/30/2013; DG CHEST 2 VIEW dated 08/10/2013  FINDINGS: 0727 hr. There is improved positioning on the current examination. There are persistent low lung volumes with patchy left lower lobe atelectasis, similar to prior examinations. There is no confluent airspace opacity, edema or significant pleural effusion. The heart size and mediastinal contours are stable. The subacromial space of both shoulders is narrowed consistent with chronic rotator cuff tears.  IMPRESSION: Stable chest.  No acute cardiopulmonary process.   Electronically Signed   By: Roxy HorsemanBill  Veazey M.D.   On: 11/22/2013 07:37   Orders placed during the hospital encounter of 11/22/13  . EKG 12-LEAD: Sinus rhythm Left anterior fascicular block Anterior infarct, old Nonspecific T abnormalities, lateral leads  .    .   .      Assessment/Plan Principal Problem:   UTI (lower urinary tract infection): yet another episode despite prophylaxis. Await cx. Cipro Rx for now Active Problems:   Hypertension:BP OK   Chronic kidney disease, stage III (moderate) At or better than her baseline   Diabetes mellitus type 2, uncontrolled: doing better at last OV   History of renal transplant : doing better   Protein-calorie malnutrition, severe: Alb better  at 3.2   Dementia with behavioral disturbance: still a progressive issue, CT OK      Unspecified hypothyroidism: DEC;   TSH                  [L]  0.36 uIU/ml                 0.40-4.20 Anemia, minor  Full CODE   Shewanda Sharpe [name] 11/22/2013, 3:30 PM

## 2013-11-22 NOTE — ED Notes (Signed)
Patient transported to X-ray 

## 2013-11-22 NOTE — ED Notes (Signed)
Took pts CBG it was 290 reported to nurse Merry ProudBrandi

## 2013-11-23 LAB — COMPREHENSIVE METABOLIC PANEL
ALT: 11 U/L (ref 0–35)
AST: 10 U/L (ref 0–37)
Albumin: 2.6 g/dL — ABNORMAL LOW (ref 3.5–5.2)
Alkaline Phosphatase: 48 U/L (ref 39–117)
BILIRUBIN TOTAL: 0.9 mg/dL (ref 0.3–1.2)
BUN: 33 mg/dL — ABNORMAL HIGH (ref 6–23)
CHLORIDE: 100 meq/L (ref 96–112)
CO2: 29 mEq/L (ref 19–32)
Calcium: 9.7 mg/dL (ref 8.4–10.5)
Creatinine, Ser: 1.27 mg/dL — ABNORMAL HIGH (ref 0.50–1.10)
GFR calc Af Amer: 47 mL/min — ABNORMAL LOW (ref >90)
GFR calc non Af Amer: 41 mL/min — ABNORMAL LOW (ref >90)
Glucose, Bld: 233 mg/dL — ABNORMAL HIGH (ref 70–99)
POTASSIUM: 4.1 meq/L (ref 3.7–5.3)
Sodium: 142 mEq/L (ref 137–147)
TOTAL PROTEIN: 5.7 g/dL — AB (ref 6.0–8.3)

## 2013-11-23 LAB — CBC
HEMATOCRIT: 33.7 % — AB (ref 36.0–46.0)
Hemoglobin: 10.5 g/dL — ABNORMAL LOW (ref 12.0–15.0)
MCH: 26.7 pg (ref 26.0–34.0)
MCHC: 31.2 g/dL (ref 30.0–36.0)
MCV: 85.8 fL (ref 78.0–100.0)
PLATELETS: 199 10*3/uL (ref 150–400)
RBC: 3.93 MIL/uL (ref 3.87–5.11)
RDW: 18.3 % — ABNORMAL HIGH (ref 11.5–15.5)
WBC: 10.1 10*3/uL (ref 4.0–10.5)

## 2013-11-23 LAB — GLUCOSE, CAPILLARY
GLUCOSE-CAPILLARY: 263 mg/dL — AB (ref 70–99)
GLUCOSE-CAPILLARY: 222 mg/dL — AB (ref 70–99)
Glucose-Capillary: 206 mg/dL — ABNORMAL HIGH (ref 70–99)
Glucose-Capillary: 180 mg/dL — ABNORMAL HIGH (ref 70–99)

## 2013-11-23 MED ORDER — ENSURE PUDDING PO PUDG
1.0000 | Freq: Three times a day (TID) | ORAL | Status: DC
  Administered 2013-11-24 (×2): 1 via ORAL

## 2013-11-23 MED ORDER — INSULIN DETEMIR 100 UNIT/ML ~~LOC~~ SOLN
12.0000 [IU] | Freq: Every day | SUBCUTANEOUS | Status: DC
  Administered 2013-11-23: 12 [IU] via SUBCUTANEOUS
  Filled 2013-11-23 (×2): qty 0.12

## 2013-11-23 NOTE — Progress Notes (Signed)
INITIAL NUTRITION ASSESSMENT  DOCUMENTATION CODES Per approved criteria  -Severe malnutrition in the context of chronic illness   INTERVENTION: Add Ensure Pudding po TID, each supplement provides 170 kcal and 4 grams of protein. RD to continue to follow nutrition care plan.  NUTRITION DIAGNOSIS: Inadequate oral intake related to AMS as evidenced by variable intake.   Goal: Intake to meet >90% of estimated nutrition needs.  Monitor:  weight trends, lab trends, I/O's, PO intake, supplement tolerance  Reason for Assessment: Malnutrition Screening Tool  73 y.o. female  Admitting Dx: UTI (lower urinary tract infection)  ASSESSMENT: PMHx significant for DM2, renal transplant, dementia and recurrent UTIs. Admitted with lethargy and AMS. Work-up reveals recurrent UTI.  Pt is alert but nonverbal, husband reports that this is her baseline. Husband reports that pt ate well for lunch; consumed 50%. He notes that pt cannot tolerate Glucerna or Ensure shakes, as they cause GI distress.  Nutrition Focused Physical Exam:   Subcutaneous Fat:  Orbital Region: moderate depletion  Upper Arm Region: n/a  Thoracic and Lumbar Region: n/a   Muscle:  Temple Region: moderate depletion  Clavicle Bone Region: severe depletion  Clavicle and Acromion Bone Region: n/a  Scapular Bone Region: n/a  Dorsal Hand: n/a  Patellar Region: severe depletion  Anterior Thigh Region: severe depletion  Posterior Calf Region: severe depletion   Edema: none  Pt meets criteria for severe MALNUTRITION in the context of chronic illness as evidenced by severe muscle mass loss and suspected intake of <75% of estimated energy intake x a at least 1 month. Pt also with 10% wt loss x 10 months.   Height: Ht Readings from Last 1 Encounters:  11/22/13 5\' 4"  (1.626 m)    Weight: Wt Readings from Last 1 Encounters:  11/22/13 135 lb (61.236 kg)    Ideal Body Weight: 120 lb  % Ideal Body Weight: 113%  Wt Readings  from Last 10 Encounters:  11/22/13 135 lb (61.236 kg)  09/30/13 140 lb 3.4 oz (63.6 kg)  08/16/13 140 lb 3.2 oz (63.594 kg)  07/04/13 139 lb 1.8 oz (63.1 kg)  05/18/13 140 lb (63.504 kg)  01/15/13 149 lb (67.586 kg)  01/07/13 153 lb 6.4 oz (69.582 kg)  08/22/12 150 lb (68.04 kg)  06/04/12 155 lb 11.2 oz (70.625 kg)  04/30/12 198 lb 10.2 oz (90.1 kg)    Usual Body Weight: 150 lb  % Usual Body Weight: 90%  BMI:  Body mass index is 23.16 kg/(m^2). Normal weight  Estimated Nutritional Needs: Kcal: 1500 - 1700 Protein: 51 - 64 g Fluid: 1.5 - 1.7 liters daily  Skin: stage II mid-sacrum  Diet Order: Carb Control  EDUCATION NEEDS: -No education needs identified at this time   Intake/Output Summary (Last 24 hours) at 11/23/13 1436 Last data filed at 11/23/13 1405  Gross per 24 hour  Intake   1555 ml  Output      0 ml  Net   1555 ml    Last BM: 2/1  Labs:   Recent Labs Lab 11/22/13 0835 11/23/13 0605  NA 141 142  K 4.4 4.1  CL 97 100  CO2 31 29  BUN 33* 33*  CREATININE 1.15* 1.27*  CALCIUM 10.5 9.7  GLUCOSE 265* 233*    CBG (last 3)   Recent Labs  11/22/13 2025 11/23/13 0727 11/23/13 1137  GLUCAP 191* 206* 180*    Scheduled Meds: . allopurinol  150 mg Oral Daily  . amLODipine  10 mg Oral  Q breakfast  . antiseptic oral rinse  15 mL Mouth Rinse BID  . calcitRIOL  0.5 mcg Oral Q breakfast  . ciprofloxacin  400 mg Intravenous Q12H  . enoxaparin (LOVENOX) injection  40 mg Subcutaneous Q24H  . fentaNYL  25 mcg Transdermal See admin instructions  . gabapentin  300 mg Oral TID  . insulin aspart  0-5 Units Subcutaneous QHS  . insulin aspart  0-9 Units Subcutaneous TID WC  . insulin detemir  12 Units Subcutaneous QHS  . labetalol  300 mg Oral TID  . latanoprost  1 drop Both Eyes QHS  . levothyroxine  224 mcg Oral QAC breakfast  . mycophenolate  500 mg Oral BID  . predniSONE  10 mg Oral Q breakfast  . sirolimus  3 mg Oral Daily  . Vitamin D  (Ergocalciferol)  50,000 Units Oral Daily    Continuous Infusions:   Past Medical History  Diagnosis Date  . Diabetes mellitus   . Diabetic neuropathy   . Hypothyroidism   . Hypertension   . GERD (gastroesophageal reflux disease)   . Heart murmur     not treated for  . Arthritis   . Chronic kidney disease     RENAL TRANSPLANT IN 2008--PT IS FOLLOWED BY DR. Karie FetchUNHAM-STABLE WITH BUN OF 37 AND CREAT 1.7--PER NOTE DR. NESI FROM 04/11/12  . Urethral diverticulum   . Eyesight diminished     GLAUCOMA   . History of shingles     LEFT EYE JAN 2012--STILL HAS EYE PAIN-STATES NOT ABLE TO TAKE THE MEDICATION FOR SHINGLES BECAUSE OF HER HX OF KIDNEY TRANSPLANT  . Fingernail abnormalities     FUNGUS OF FINGERNAILS  . UTI (urinary tract infection) 05/18/2013  . UTI (lower urinary tract infection)     Past Surgical History  Procedure Laterality Date  . Kidney [facility]  2008    [facility]  . Back surgery      ruptured disk  . Abdominal hysterectomy    . Appendectomy    . Laser surgery of both eyes for hemorrhages    . 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 Urethral diverticulectomy  04/28/2012    Procedure: URETHRAL DIVERTICULECTOMY;  Surgeon: Antony HasteMatthew Ramsey Eskridge, MD;  Location: [facility];  Service: Urology;  Laterality: N/A;    Jarold MottoSamantha Misty Rago MS, RD, LDN Pager: [phone] After-hours pager: [phone]

## 2013-11-23 NOTE — Progress Notes (Signed)
Came to visit patient at bedside. Spoke with patient's husband. On last post hospital discharge call, Mr Jennette Kettleeal stated he did not think he needed [facility] follow up. Today at bedside, Mr Jennette Kettleeal states they have been managing fine at home and Mrs Jennette Kettleeal has home health services through [facility]. States he still does not think they need further follow up from [facility]. [facility] brochure left with contact information to contact in future if needed. Will make inpatient RNCM aware of visit.  Raiford NobleAtika Rahkim Rabalais, MSN, RN, BSN- [facility] Hospital [phone]

## 2013-11-23 NOTE — Progress Notes (Signed)
Patient currently alert but nonverbal. Patient unable and refusing to take any PO medications. Per her husband this is her normal. Dr Evlyn KannerSouth notified at office by his nurse. Will continue to reassess patients ability to take medications.

## 2013-11-23 NOTE — Progress Notes (Signed)
Subjective: A bit more awake. Only taking liquids. One semi formed stool   Objective: Vital signs in last 24 hours: Temp:  [97.6 F (36.4 C)-99 F (37.2 C)] 98.4 F (36.9 C) (02/03 0544) Pulse Rate:  [77-86] 77 (02/03 0544) Resp:  [18-25] 18 (02/02 2020) BP: (112-145)/(37-71) 118/42 mmHg (02/03 0544) SpO2:  [76 %-100 %] 76 % (02/03 0544) Weight:  [61.236 kg (135 lb)] 61.236 kg (135 lb) (02/02 2020)  Intake/Output from previous day: 02/02 0701 - 02/03 0700 In: 1315 [I.V.:1115; IV Piggyback:200] Out: -  Intake/Output this shift:    Sitting up,. Eyes open. Moist oral  Membranes, lungs clear, no wheeze. Ht sl irreg with murmur abd soft nontender, non distended. Awake but not really verbal this AM  Lab Results   Recent Labs  11/22/13 0835 11/23/13 0605  WBC 9.5 10.1  RBC 4.35 3.93  HGB 11.9* 10.5*  HCT 36.9 33.7*  MCV 84.8 85.8  MCH 27.4 26.7  RDW 18.1* 18.3*  PLT 214 199    Recent Labs  11/22/13 0835 11/23/13 0605  NA 141 142  K 4.4 4.1  CL 97 100  CO2 31 29  GLUCOSE 265* 233*  BUN 33* 33*  CREATININE 1.15* 1.27*  CALCIUM 10.5 9.7    Studies/Results: Dg Chest Port 1 View  11/22/2013   CLINICAL DATA:  Fever.  Weakness.  EXAM: PORTABLE CHEST - 1 VIEW  COMPARISON:  DG CHEST 1 VIEW dated 11/10/2013; DG CHEST 1V PORT dated 09/30/2013; DG CHEST 2 VIEW dated 08/10/2013  FINDINGS: 0727 hr. There is improved positioning on the current examination. There are persistent low lung volumes with patchy left lower lobe atelectasis, similar to prior examinations. There is no confluent airspace opacity, edema or significant pleural effusion. The heart size and mediastinal contours are stable. The subacromial space of both shoulders is narrowed consistent with chronic rotator cuff tears.  IMPRESSION: Stable chest.  No acute cardiopulmonary process.   Electronically Signed   By: Roxy Horseman M.D.   On: 11/22/2013 07:37    Scheduled Meds: . allopurinol  150 mg Oral Daily  .  amLODipine  10 mg Oral Q breakfast  . antiseptic oral rinse  15 mL Mouth Rinse BID  . calcitRIOL  0.5 mcg Oral Q breakfast  . ciprofloxacin  400 mg Intravenous Q12H  . enoxaparin (LOVENOX) injection  40 mg Subcutaneous Q24H  . fentaNYL  25 mcg Transdermal See admin instructions  . gabapentin  300 mg Oral TID  . insulin aspart  0-5 Units Subcutaneous QHS  . insulin aspart  0-9 Units Subcutaneous TID WC  . insulin detemir  12 Units Subcutaneous QHS  . labetalol  300 mg Oral TID  . latanoprost  1 drop Both Eyes QHS  . levothyroxine  224 mcg Oral QAC breakfast  . mycophenolate  500 mg Oral BID  . predniSONE  10 mg Oral Q breakfast  . sirolimus  3 mg Oral Daily  . Vitamin D (Ergocalciferol)  50,000 Units Oral Daily   Continuous Infusions:  PRN Meds:  Assessment/Plan:  UTI (lower urinary tract infection): On Rx, fever resolved Hypertension:BP OK  Chronic kidney disease, stage III (moderate) At or better than her baseline  Diabetes mellitus type 2, uncontrolled: FBS 233, not eating well  History of renal transplant : doing better  Protein-calorie malnutrition, severe: Alb better at 3.2  Dementia with behavioral disturbance: still a progressive issue, CT OK  Unspecified hypothyroidism: DEC; TSH [L] 0.36 uIU/ml 0.40-4.20  Anemia, minor  Full CODE    LOS: 1 day   [employer] [name] 11/23/2013, 8:22 AM

## 2013-11-23 NOTE — Progress Notes (Signed)
Utilization review completed.  

## 2013-11-24 LAB — GLUCOSE, CAPILLARY
GLUCOSE-CAPILLARY: 203 mg/dL — AB (ref 70–99)
GLUCOSE-CAPILLARY: 266 mg/dL — AB (ref 70–99)
Glucose-Capillary: 84 mg/dL (ref 70–99)
Glucose-Capillary: 140 mg/dL — ABNORMAL HIGH (ref 70–99)
Glucose-Capillary: 267 mg/dL — ABNORMAL HIGH (ref 70–99)

## 2013-11-24 LAB — URINE CULTURE

## 2013-11-24 MED ORDER — INSULIN DETEMIR 100 UNIT/ML ~~LOC~~ SOLN
10.0000 [IU] | Freq: Every day | SUBCUTANEOUS | Status: DC
  Administered 2013-11-24: 10 [IU] via SUBCUTANEOUS
  Filled 2013-11-24 (×2): qty 0.1

## 2013-11-24 MED ORDER — CIPROFLOXACIN HCL 750 MG PO TABS
750.0000 mg | ORAL_TABLET | Freq: Two times a day (BID) | ORAL | Status: DC
  Administered 2013-11-24 – 2013-11-25 (×2): 750 mg via ORAL
  Filled 2013-11-24 (×4): qty 1

## 2013-11-24 NOTE — Clinical Documentation Improvement (Signed)
THIS DOCUMENT IS NOT A PERMANENT PART OF THE MEDICAL RECORD  Please update your documentation with the medical record to reflect your response to this query. If you need help knowing how to do this please call [phone].  11/24/13  Dr. Evlyn [facility] and/or Associates,  In a better effort to capture your patient's severity of illness, reflect appropriate length of stay and utilization of resources, a review of the patient medical record has revealed the following indicators:   - Decubitus Ulcer that is on her sacrum" documented by ED physician   - Stage 2 pressure ulcer described by nursing as " partial thickness loss of dermia presenting as a shallow open ulcer with a red pink wound bed without slough" on the Doc Flowsheets    Based on your clinical judgment, please document in the progress notes and discharge summary if you agree with the documentation of a Pressure Ulcer of the Sacrum, including the Stage and if Present on Admission:                        - Stage  I  Pressure Ulcer   (reddening of the skin)   - Stage  II Pressure Ulcer  (blister open or unopened)   - Stage  III Pressure Ulcer (through all layers skin)   - Stage IV Pressure Ulcer   (through skin & underlying  muscle, tendons, and bones)   - Other Condition   - Unable to Clinically Determine    Reviewed:  Sacral decubitus ulcer documented in dc summary 11/25/2013.  CS  Thank You,  Jerral Ralphathy R Branden Shallenberger  RN BSN CCDS Certified Clinical Documentation Specialist: [phone] Health Information Management [place]

## 2013-11-24 NOTE — Clinical Documentation Improvement (Signed)
THIS DOCUMENT IS NOT A PERMANENT PART OF THE MEDICAL RECORD  Please update your documentation with the medical record to reflect your response to this query. If you need help knowing how to do this please call [phone].  11/24/13  Dr. Evlyn [facility] and/or Associates,  In a better effort to capture your patient's severity of illness, reflect appropriate length of stay and utilization of resources, a review of the patient medical record has revealed the following indicators:   - Current diagnosis Klebsiella Pneumoniae UTI   - Chronically immunosuppressed 2/2 medications s/p Renal Transplant   - Temp on admission - 102.2 rectal   - WBC 9.5 in light of chronic immunosuppression   - Respiratory Rate in ED > 20 bpm   - Chief complaint on presentation - Altered Mental Status and reportedly less responsive and having difficulty keeping liquids and her medications in her mouth per patient's husband.   Based on your clinical judgment, please document in the progress notes and discharge summary if a condition below provides greater specificity regarding the patient's admission diagnosis:   - Sepsis 2/2 Klebsiella Pneumoniae UTI, Present on Admission   - Other Condition   - Unable to Clinically Determine   In responding to this query please exercise your independent judgment.    The fact that a query is asked, does not imply that any particular answer is desired or expected.    Reviewed:  Assumed to be disagreed based on dc summary.  11/25/2013.  CS  Thank You,  Erika Ralphathy R Tonga Prout  RN BSN CCDS Certified Clinical Documentation Specialist: [phone] Health Information Management [place]

## 2013-11-24 NOTE — Progress Notes (Signed)
This patient is receiving the antibiotic Ciprofloxacin by the intravenous route. Based on criteria approved by the Pharmacy and Therapeutics Committee, and the Infectious Disease Division, the antibiotic is being converted to equivalent oral dose form. These criteria include   . Patient being treated for a respiratory tract infection, urinary tract infection, cellulitis, or Clostridium Difficile Associated Diarrhea . The patient is not neutropenic and does not exhibit a GI malabsorption state . The patient is eating (either orally or per tube) and/or has been taking other orally administered medications for at least 24 hours. . The patient is improving clinically (physician assessment and a 24-hour Tmax of ? 100.5? F).  If you have questions about this conversion, please contact the pharmacy department. Thank you.  Pola CornBen Jamarco Zaldivar, PharmD Clinical Pharmacist

## 2013-11-24 NOTE — Progress Notes (Signed)
Pt's family member refusing to have patient on bed alarm. Pt and family member made aware of safety precautions and told to call before patient gets oob for assistance. Will continue to monitor.

## 2013-11-24 NOTE — Progress Notes (Signed)
Subjective: More awake but still having trouble getting in AM meds Ate better later in the day No fever Talked with her urologist who is to see her post D/C   Objective: Vital signs in last 24 hours: Temp:  [97.8 F (36.6 C)-98.7 F (37.1 C)] 98 F (36.7 C) (02/04 0612) Pulse Rate:  [72-116] 72 (02/04 0612) Resp:  [16-24] 22 (02/04 0612) BP: (129-141)/(48-110) 129/56 mmHg (02/04 0612) SpO2:  [95 %-100 %] 100 % (02/04 0612)  Intake/Output from previous day: 02/03 0701 - 02/04 0700 In: 500 [P.O.:300; IV Piggyback:200] Out: -  Intake/Output this shift:    A bit more awake. Lungs clear ht regular abd soft NT, non verbal mostly  Lab Results   Recent Labs  11/22/13 0835 11/23/13 0605  WBC 9.5 10.1  RBC 4.35 3.93  HGB 11.9* 10.5*  HCT 36.9 33.7*  MCV 84.8 85.8  MCH 27.4 26.7  RDW 18.1* 18.3*  PLT 214 199    Recent Labs  11/22/13 0835 11/23/13 0605  NA 141 142  K 4.4 4.1  CL 97 100  CO2 31 29  GLUCOSE 265* 233*  BUN 33* 33*  CREATININE 1.15* 1.27*  CALCIUM 10.5 9.7   KLEBSIELLA PNEUMONIAE Performed at [facility]   Report Status   11/24/2013 FINAL   Organism ID, Bacteria   KLEBSIELLA PNEUMONIAE      Culture & Susceptibility    KLEBSIELLA PNEUMONIAE    Antibiotic Sensitivity Microscan Status    AMPICILLIN Resistant >=32 RESISTANT Final    Method: MIC    CEFAZOLIN Sensitive <=4 SENSITIVE Final    Method: MIC    CEFTRIAXONE Sensitive <=1 SENSITIVE Final    Method: MIC    CIPROFLOXACIN Sensitive 0.5 SENSITIVE Final    Method: MIC    GENTAMICIN Sensitive <=1 SENSITIVE Final    Method: MIC    LEVOFLOXACIN Sensitive 1 SENSITIVE Final    Method: MIC    NITROFURANTOIN Resistant >=512 RESISTANT Final    Method: MIC    PIP/TAZO Sensitive 16 SENSITIVE Final    Method: MIC    TOBRAMYCIN Sensitive <=1 SENSITIVE Final    Method: MIC    TRIMETH/SULFA Sensitive <=20 SENSITIVE Final    Studies/Results: No results found.  Scheduled Meds: .  allopurinol  150 mg Oral Daily  . amLODipine  10 mg Oral Q breakfast  . antiseptic oral rinse  15 mL Mouth Rinse BID  . calcitRIOL  0.5 mcg Oral Q breakfast  . ciprofloxacin  400 mg Intravenous Q12H  . enoxaparin (LOVENOX) injection  40 mg Subcutaneous Q24H  . feeding supplement (ENSURE)  1 Container Oral TID BM  . fentaNYL  25 mcg Transdermal See admin instructions  . gabapentin  300 mg Oral TID  . insulin aspart  0-5 Units Subcutaneous QHS  . insulin aspart  0-9 Units Subcutaneous TID WC  . insulin detemir  12 Units Subcutaneous QHS  . labetalol  300 mg Oral TID  . latanoprost  1 drop Both Eyes QHS  . levothyroxine  224 mcg Oral QAC breakfast  . mycophenolate  500 mg Oral BID  . predniSONE  10 mg Oral Q breakfast  . sirolimus  3 mg Oral Daily  . Vitamin D (Ergocalciferol)  50,000 Units Oral Daily   Continuous Infusions:  PRN Meds:  Assessment/Plan:  Klebsiella UTI (lower urinary tract infection): On Rx, fever resolved . sens to cipro Hypertension:BP OK  Chronic kidney disease, stage III (moderate) At or better than her baseline  Diabetes mellitus type 2, uncontrolled: FBS only 84, reduce Rx  History of renal transplant : doing better  Protein-calorie malnutrition, severe: Alb better at 3.2  Dementia with behavioral disturbance: still a progressive issue, CT OK  Unspecified hypothyroidism: DEC; TSH [L] 0.36 uIU/ml 0.40-4.20  Anemia, minor  Full CODE    LOS: 2 days   [name] [name] 11/24/2013, 8:26 AM

## 2013-11-25 LAB — CBC
HCT: 32 % — ABNORMAL LOW (ref 36.0–46.0)
Hemoglobin: 10.3 g/dL — ABNORMAL LOW (ref 12.0–15.0)
MCH: 27 pg (ref 26.0–34.0)
MCHC: 32.2 g/dL (ref 30.0–36.0)
MCV: 84 fL (ref 78.0–100.0)
Platelets: 194 10*3/uL (ref 150–400)
RBC: 3.81 MIL/uL — ABNORMAL LOW (ref 3.87–5.11)
RDW: 17.8 % — AB (ref 11.5–15.5)
WBC: 5.9 10*3/uL (ref 4.0–10.5)

## 2013-11-25 LAB — BASIC METABOLIC PANEL
BUN: 37 mg/dL — ABNORMAL HIGH (ref 6–23)
CALCIUM: 9.8 mg/dL (ref 8.4–10.5)
CHLORIDE: 99 meq/L (ref 96–112)
CO2: 26 mEq/L (ref 19–32)
CREATININE: 1.13 mg/dL — AB (ref 0.50–1.10)
GFR calc non Af Amer: 47 mL/min — ABNORMAL LOW (ref >90)
GFR, EST AFRICAN AMERICAN: 54 mL/min — AB (ref >90)
Glucose, Bld: 130 mg/dL — ABNORMAL HIGH (ref 70–99)
Potassium: 3.8 mEq/L (ref 3.7–5.3)
Sodium: 139 mEq/L (ref 137–147)

## 2013-11-25 LAB — GLUCOSE, CAPILLARY
GLUCOSE-CAPILLARY: 92 mg/dL (ref 70–99)
Glucose-Capillary: 140 mg/dL — ABNORMAL HIGH (ref 70–99)

## 2013-11-25 MED ORDER — CIPROFLOXACIN HCL 500 MG PO TABS: 500.0000 mg | ORAL_TABLET | Freq: Two times a day (BID) | ORAL | Status: DC

## 2013-11-25 NOTE — Discharge Summary (Signed)
DISCHARGE SUMMARY  [name]  MR#: [id number]  DOB:December 11, 1940  Date of Admission: 11/22/2013 Date of Discharge: 11/25/2013  Attending Physician:[name]  Patient's WUJ:[id number] Erika Diener, MD  Consults  Discharge Diagnoses: Principal Problem:   UTI (lower urinary tract infection) Klebsiella pneumoniae,  Active Problems:   Hypertension   Chronic kidney disease, stage III (moderate)   Diabetes mellitus type 2, uncontrolled   History of renal transplant   Protein-calorie malnutrition, severe   Dementia with behavioral disturbance   Acute on chronic renal insufficiency   Unspecified hypothyroidism   UTI (urinary tract infection) Sacral decubitus Gout Secondary hyperparathyroidism Chronic pain syndrome `   Discharge Medications:   Medication List    STOP taking these medications       nitrofurantoin 100 MG capsule  Commonly known as:  MACRODANTIN      TAKE these medications       allopurinol 300 MG tablet  Commonly known as:  ZYLOPRIM  Take 150 mg by mouth daily.     amLODipine 10 MG tablet  Commonly known as:  NORVASC  Take 1 tablet (10 mg total) by mouth daily with breakfast.     calcitRIOL 0.5 MCG capsule  Commonly known as:  ROCALTROL  Take 1 capsule (0.5 mcg total) by mouth daily with breakfast.     ciprofloxacin 500 MG tablet  Commonly known as:  CIPRO  Take 1 tablet (500 mg total) by mouth 2 (two) times daily.     fentaNYL 25 MCG/HR patch  Commonly known as:  DURAGESIC - dosed mcg/hr  Place 1 patch (25 mcg total) onto the skin See admin instructions.     furosemide 40 MG tablet  Commonly known as:  LASIX  Take 80 mg by mouth 2 (two) times daily.     gabapentin 300 MG capsule  Commonly known as:  NEURONTIN  Take 300 mg by mouth 4 (four) times daily.     insulin detemir 100 UNIT/ML injection  Commonly known as:  LEVEMIR  Inject 18 Units into the skin every morning.     insulin lispro 100 UNIT/ML injection  Commonly known as:   HUMALOG  Inject 0-4 Units into the skin 3 (three) times daily before meals. Per sliding scale     labetalol 200 MG tablet  Commonly known as:  NORMODYNE  Take 1.5 tablets (300 mg total) by mouth 3 (three) times daily. Takes 1 and 1/2 tabs     latanoprost 0.005 % ophthalmic solution  Commonly known as:  XALATAN  Place 1 drop into both eyes at bedtime.     levothyroxine 112 MCG tablet  Commonly known as:  SYNTHROID, LEVOTHROID  Take 2 tablets (224 mcg total) by mouth daily with breakfast.     loperamide 2 MG capsule  Commonly known as:  IMODIUM  Take 2 mg by mouth as needed for diarrhea or loose stools.     mycophenolate 250 MG capsule  Commonly known as:  CELLCEPT  Take 2 capsules (500 mg total) by mouth 2 (two) times daily.     potassium chloride SA 20 MEQ tablet  Commonly known as:  K-DUR,KLOR-CON  Take 1 tablet (20 mEq total) by mouth daily.     predniSONE 10 MG tablet  Commonly known as:  DELTASONE  Take 10 mg by mouth daily with breakfast.     sirolimus 1 MG tablet  Commonly known as:  RAPAMUNE  Take 3 tablets (3 mg total) by mouth daily. Pt takes three 1 mg tablets =  3 mg daily     Vitamin D (Ergocalciferol) 50000 UNITS Caps capsule  Commonly known as:  DRISDOL  Take 50,000 Units by mouth daily.        Hospital Procedures: Dg Chest 1 View  11/10/2013   CLINICAL DATA:  Confusion.  Hypertension.  EXAM: CHEST - 1 VIEW  COMPARISON:  09/30/2013  FINDINGS: Study is suboptimal being tilted and rotated to the right. Allowing for this, there is no convincing lung consolidation or edema. No pleural effusion or pneumothorax is seen. Cardiac silhouette is mildly enlarged. The bony thorax is diffusely demineralized but grossly intact.  IMPRESSION: No acute cardiopulmonary disease.   Electronically Signed   By: Erika Portland M.D.   On: 11/10/2013 16:13   Ct Head Wo Contrast  11/10/2013   CLINICAL DATA:  Altered mental status  EXAM: CT HEAD WITHOUT CONTRAST  TECHNIQUE: Contiguous  axial images were obtained from the base of the skull through the vertex without intravenous contrast.  COMPARISON:  08/10/2013  FINDINGS: Faint calcifications in the globus pallidus nuclei bilaterally, a chronic and likely physiologic finding.  Otherwise, the brainstem, cerebellum, cerebral peduncles, thalamus, basal ganglia, basilar cisterns, and ventricular system appear within normal limits. Periventricular white matter and corona radiata hypodensities favor chronic ischemic microvascular white matter disease. No intracranial hemorrhage, mass lesion, or acute CVA. Atherosclerotic calcification of the carotid siphons noted.  There is a moderate amount of chronic ethmoid sinusitis, similar to prior.  IMPRESSION: 1. Chronic ethmoid sinusitis. 2. No acute intracranial findings. 3. Periventricular white matter and corona radiata hypodensities favor chronic ischemic microvascular white matter disease.   Electronically Signed   By: Erika Baltimore M.D.   On: 11/10/2013 18:36   Dg Chest Port 1 View  11/22/2013   CLINICAL DATA:  Fever.  Weakness.  EXAM: PORTABLE CHEST - 1 VIEW  COMPARISON:  DG CHEST 1 VIEW dated 11/10/2013; DG CHEST 1V PORT dated 09/30/2013; DG CHEST 2 VIEW dated 08/10/2013  FINDINGS: 0727 hr. There is improved positioning on the current examination. There are persistent low lung volumes with patchy left lower lobe atelectasis, similar to prior examinations. There is no confluent airspace opacity, edema or significant pleural effusion. The heart size and mediastinal contours are stable. The subacromial space of both shoulders is narrowed consistent with chronic rotator cuff tears.  IMPRESSION: Stable chest.  No acute cardiopulmonary process.   Electronically Signed   By: Erika Horseman M.D.   On: 11/22/2013 07:37    History of Present Illness:  Fever and altered mental status  Hospital Course: There is a 73 year old white female with a history recurrent urinary tract infections, history renal  transplant, progressive dementia, and general failure to thrive. She presented with yet another urinary tract infection this time caused by Klebsiella. She's been to broad-spectrum antibiotics and now is begun to improve significantly. Her altered mental status seems to have resolved back to her baseline. She's now taking her medications better and has no pain or complaints. Her fever curve has done well. She's had no significant leukocytosis. Interestingly her pancytopenia during the last hospitalization has not recurred. Her renal function has done well and is better than her baseline but a little bit. Her blood sugars have done well despite the poor by mouth intake. In general however she is in a declining state over the last 18 months with dementia weight loss and failure to thrive. I do expect this to continue. Her husband is unable to do an excellent job taking care of  her at home. 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 Her other medical problems have done well. She does have an uninfected sacral decubitus that is being managed in the home setting. She has protein calorie malnutrition which is difficult to fix. She has a mild anemia that is stable.   Day of Discharge Exam BP 155/62  Pulse 83  Temp(Src) 98 F (36.7 C) (Axillary)  Resp 14  Ht 5\' 4"  (1.626 m)  Wt 61.236 kg (135 lb)  BMI 23.16 kg/m2  SpO2 96%  Physical Exam: General appearance: frail black female sitting up in no distress. Some swelling around the left eye is noted. Sclerae are anicteric. Eyes are conjugate. No nystagmus is present. Oral mucous members are moist.    Resp: Clear with no wheezes rales or rhonchi, no accessory muscles are in use  Cardio: regular with occasional skips a systolic murmur in the mitral area  GI: soft, non-tender; bowel sounds normal; no masses,  no organomegaly, multiple well-healed scars are present.  Extremities: relatively preserved  pulses with 1+ edema. Sacral decubitus is present without odor or infection   Discharge Labs:  Recent Labs  11/23/13 0605 11/25/13 0454  NA 142 139  K 4.1 3.8  CL 100 99  CO2 29 26  GLUCOSE 233* 130*  BUN 33* 37*  CREATININE 1.27* 1.13*  CALCIUM 9.7 9.8    Recent Labs  11/23/13 0605  AST 10  ALT 11  ALKPHOS 48  BILITOT 0.9  PROT 5.7*  ALBUMIN 2.6*    Recent Labs  11/23/13 0605 11/25/13 0454  WBC 10.1 5.9  HGB 10.5* 10.3*  HCT 33.7* 32.0*  MCV 85.8 84.0  PLT 199 194   11/24/2013     Component Results    Component    Specimen Description    URINE, CATHETERIZED    Special Requests    NONE    Culture  Setup Time    11/22/2013 16:44 Performed at [facility]    >=100,000 COLONIES/ML Performed at [facility]    Culture    KLEBSIELLA PNEUMONIAE Performed at [facility]    Report Status    11/24/2013 FINAL    Organism ID, Bacteria    KLEBSIELLA PNEUMONIAE     Culture & Susceptibility    KLEBSIELLA PNEUMONIAE    Antibiotic Sensitivity Microscan Status    AMPICILLIN Resistant >=32 RESISTANT Final    Method: MIC    CEFAZOLIN Sensitive <=4 SENSITIVE Final    Method: MIC    CEFTRIAXONE Sensitive <=1 SENSITIVE Final    Method: MIC    CIPROFLOXACIN Sensitive 0.5 SENSITIVE Final    Method: MIC    GENTAMICIN Sensitive <=1 SENSITIVE Final    Method: MIC    LEVOFLOXACIN Sensitive 1 SENSITIVE Final    Method: MIC    NITROFURANTOIN Resistant >=512 RESISTANT Final    Method: MIC    PIP/TAZO Sensitive 16 SENSITIVE Final    Method: MIC    TOBRAMYCIN Sensitive <=1 SENSITIVE Final    Method: MIC    TRIMETH/SULFA Sensitive <=20 SENSITIVE Final    Method: MIC    Comments KLEBSIELLA PNEUMONIAE (MIC)    KLEBSIELLA PNEUMONIAE        CLINICAL DATA: Fever. Weakness.  EXAM:  PORTABLE CHEST - 1 VIEW  COMPARISON: DG CHEST 1 VIEW dated 11/10/2013; DG CHEST 1V PORT dated  09/30/2013; DG CHEST 2 VIEW dated 08/10/2013  FINDINGS:  0727 hr. There is improved positioning on the current examination.  There are persistent low lung volumes with patchy left lower lobe  atelectasis, similar to prior examinations. There is no confluent  airspace opacity, edema or significant pleural effusion. The heart  size and mediastinal contours are stable. The subacromial space of  both shoulders is narrowed consistent with chronic rotator cuff  tears.  IMPRESSION:  Stable chest. No acute cardiopulmonary process.  Electronically Signed  By: Erika HorsemanBill Veazey M.D.  On: 11/22/2013 07:37  Sinus rhythm Left anterior fascicular block Anterior infarct, old Nonspecific T abnormalities, lateral leads     Discharge instructions: Resume your prior medications including the same management of your diabetes . Continue oral Cipro for 5 days then return to your nitrofurantoin medication after that    Disposition: To home   Follow-up Appts: [facility]  Call for appointment.  we also need you to set to see Dr. Mena GoesEskridge for repeat urology appointment  Condition on Discharge: Improved   Tests Needing Follow-up: None   Signed: Veverly Larimer Russo 11/25/2013, 11:19 AM   is to get him onhydrocephalus Monday

## 2013-11-25 NOTE — Care Management Note (Signed)
   CARE MANAGEMENT NOTE 11/25/2013  Patient:  [name],[name]   Account Number:  [id number]  Date Initiated:  11/22/2013  Documentation initiated by:  Darlyne RussianOLAND,ANNETTE  Subjective/Objective Assessment:   admitted with UTI  lives at home with spouse     Action/Plan:   active with [facility] RN  resumption of home health servces at time of discharge  11/25/13 [facility] of plan to d/c, new HH orders and face to face required. Dr Timothy Lassousso notifed and provided orders   Anticipated DC Date:  11/25/2013   Anticipated DC Plan:  HOME W HOME HEALTH SERVICES      DC Planning Services  CM consult      [facility]  Resumption Of Svcs/PTA Provider   Choice offered to / List presented to:  C-3 Spouse        HH arranged  HH-1 RN  HH-2 PT  HH-3 OT  HH-4 NURSE'S AIDE      [facility]   Status of service:  Completed, signed off Medicare Important Message given?   (If response is "NO", the following Medicare IM given date fields will be blank) Date Medicare IM given:   Date Additional Medicare IM given:    Discharge Disposition:  HOME W HOME HEALTH SERVICES  Per UR Regulation:    If discussed at Long Length of Stay Meetings, dates discussed:    Comments:  11/22/2013  1500 Darlyne RussianAnnette Roland RN, [place]  [phone]  [facility]Mary called to advise patient is [facility]

## 2013-11-25 NOTE — Progress Notes (Signed)
Patient was discharged home with PTAR. Patient was discharged with husband and belongings. Patient was stable upon discharge.

## 2013-11-26 ENCOUNTER — Other Ambulatory Visit: Payer: Self-pay | Admitting: Nurse Practitioner

## 2013-11-26 NOTE — Progress Notes (Signed)
Pt discharged to home after visit summary reviewed and pt capable of re verbalizing medications and follow up appointments. Pt remains stable. No signs and symptoms of distress. Educated to return to ER in the event of SOB, dizziness, chest pain, or fainting. Dawna Jakes, RN   

## 2013-11-28 LAB — CULTURE, BLOOD (ROUTINE X 2)
CULTURE: NO GROWTH
Culture: NO GROWTH

## 2013-12-13 ENCOUNTER — Other Ambulatory Visit: Payer: Self-pay | Admitting: Nurse Practitioner

## 2014-01-03 ENCOUNTER — Emergency Department (HOSPITAL_COMMUNITY): Payer: Medicare Other

## 2014-01-03 ENCOUNTER — Emergency Department (HOSPITAL_COMMUNITY)
Admission: EM | Admit: 2014-01-03 | Discharge: 2014-01-03 | Disposition: A | Discharge status: Home / Self Care | Payer: Medicare Other | Attending: Emergency Medicine | Admitting: Emergency Medicine

## 2014-01-03 ENCOUNTER — Encounter (HOSPITAL_COMMUNITY): Payer: Self-pay | Admitting: Emergency Medicine

## 2014-01-03 DIAGNOSIS — E039 Hypothyroidism, unspecified: Secondary | ICD-10-CM | POA: Insufficient documentation

## 2014-01-03 DIAGNOSIS — E1142 Type 2 diabetes mellitus with diabetic polyneuropathy: Secondary | ICD-10-CM | POA: Insufficient documentation

## 2014-01-03 DIAGNOSIS — E1149 Type 2 diabetes mellitus with other diabetic neurological complication: Secondary | ICD-10-CM | POA: Insufficient documentation

## 2014-01-03 DIAGNOSIS — Z940 Kidney transplant status: Secondary | ICD-10-CM | POA: Insufficient documentation

## 2014-01-03 DIAGNOSIS — Z79899 Other long term (current) drug therapy: Secondary | ICD-10-CM | POA: Insufficient documentation

## 2014-01-03 DIAGNOSIS — Z9889 Other specified postprocedural states: Secondary | ICD-10-CM | POA: Insufficient documentation

## 2014-01-03 DIAGNOSIS — IMO0002 Reserved for concepts with insufficient information to code with codable children: Secondary | ICD-10-CM | POA: Insufficient documentation

## 2014-01-03 DIAGNOSIS — N186 End stage renal disease: Secondary | ICD-10-CM | POA: Insufficient documentation

## 2014-01-03 DIAGNOSIS — Z7401 Bed confinement status: Secondary | ICD-10-CM | POA: Insufficient documentation

## 2014-01-03 DIAGNOSIS — Z993 Dependence on wheelchair: Secondary | ICD-10-CM | POA: Insufficient documentation

## 2014-01-03 DIAGNOSIS — K219 Gastro-esophageal reflux disease without esophagitis: Secondary | ICD-10-CM | POA: Insufficient documentation

## 2014-01-03 DIAGNOSIS — F0390 Unspecified dementia without behavioral disturbance: Secondary | ICD-10-CM | POA: Insufficient documentation

## 2014-01-03 DIAGNOSIS — Z880 Allergy status to penicillin: Secondary | ICD-10-CM | POA: Insufficient documentation

## 2014-01-03 DIAGNOSIS — Z8619 Personal history of other infectious and parasitic diseases: Secondary | ICD-10-CM | POA: Insufficient documentation

## 2014-01-03 DIAGNOSIS — I129 Hypertensive chronic kidney disease with stage 1 through stage 4 chronic kidney disease, or unspecified chronic kidney disease: Secondary | ICD-10-CM | POA: Insufficient documentation

## 2014-01-03 DIAGNOSIS — N390 Urinary tract infection, site not specified: Secondary | ICD-10-CM | POA: Insufficient documentation

## 2014-01-03 DIAGNOSIS — Z794 Long term (current) use of insulin: Secondary | ICD-10-CM | POA: Insufficient documentation

## 2014-01-03 DIAGNOSIS — Z90710 Acquired absence of both cervix and uterus: Secondary | ICD-10-CM | POA: Insufficient documentation

## 2014-01-03 DIAGNOSIS — R011 Cardiac murmur, unspecified: Secondary | ICD-10-CM | POA: Insufficient documentation

## 2014-01-03 HISTORY — DX: Unspecified dementia, unspecified severity, without behavioral disturbance, psychotic disturbance, mood disturbance, and anxiety: F03.90

## 2014-01-03 HISTORY — DX: Bed confinement status: Z74.01

## 2014-01-03 LAB — COMPREHENSIVE METABOLIC PANEL
ALT: 10 U/L (ref 0–35)
AST: 14 U/L (ref 0–37)
Albumin: 3.3 g/dL — ABNORMAL LOW (ref 3.5–5.2)
Alkaline Phosphatase: 47 U/L (ref 39–117)
BUN: 49 mg/dL — ABNORMAL HIGH (ref 6–23)
CO2: 30 mEq/L (ref 19–32)
CREATININE: 1.16 mg/dL — AB (ref 0.50–1.10)
Calcium: 10.5 mg/dL (ref 8.4–10.5)
Chloride: 96 mEq/L (ref 96–112)
GFR calc non Af Amer: 45 mL/min — ABNORMAL LOW (ref >90)
GFR, EST AFRICAN AMERICAN: 52 mL/min — AB (ref >90)
GLUCOSE: 258 mg/dL — AB (ref 70–99)
Potassium: 3.1 mEq/L — ABNORMAL LOW (ref 3.7–5.3)
SODIUM: 140 meq/L (ref 137–147)
TOTAL PROTEIN: 6.2 g/dL (ref 6.0–8.3)
Total Bilirubin: 0.8 mg/dL (ref 0.3–1.2)

## 2014-01-03 LAB — URINE MICROSCOPIC-ADD ON

## 2014-01-03 LAB — CBC
HCT: 33.8 % — ABNORMAL LOW (ref 36.0–46.0)
Hemoglobin: 10.7 g/dL — ABNORMAL LOW (ref 12.0–15.0)
MCH: 26.8 pg (ref 26.0–34.0)
MCHC: 31.7 g/dL (ref 30.0–36.0)
MCV: 84.5 fL (ref 78.0–100.0)
Platelets: 220 10*3/uL (ref 150–400)
RBC: 4 MIL/uL (ref 3.87–5.11)
RDW: 18.4 % — ABNORMAL HIGH (ref 11.5–15.5)
WBC: 4.2 10*3/uL (ref 4.0–10.5)

## 2014-01-03 LAB — CBG MONITORING, ED
Glucose-Capillary: 207 mg/dL — ABNORMAL HIGH (ref 70–99)
Glucose-Capillary: 283 mg/dL — ABNORMAL HIGH (ref 70–99)

## 2014-01-03 LAB — URINALYSIS, ROUTINE W REFLEX MICROSCOPIC
BILIRUBIN URINE: NEGATIVE
Glucose, UA: 250 mg/dL — AB
Hgb urine dipstick: NEGATIVE
Ketones, ur: NEGATIVE mg/dL
NITRITE: NEGATIVE
PH: 5 (ref 5.0–8.0)
Protein, ur: 100 mg/dL — AB
Specific Gravity, Urine: 1.02 (ref 1.005–1.030)
Urobilinogen, UA: 0.2 mg/dL (ref 0.0–1.0)

## 2014-01-03 LAB — LACTIC ACID, PLASMA: LACTIC ACID, VENOUS: 1 mmol/L (ref 0.5–2.2)

## 2014-01-03 LAB — TROPONIN I

## 2014-01-03 MED ORDER — DEXTROSE 5 % IV SOLN
1.0000 g | Freq: Once | INTRAVENOUS | Status: AC
  Administered 2014-01-03: 1 g via INTRAVENOUS
  Filled 2014-01-03: qty 10

## 2014-01-03 MED ORDER — CEPHALEXIN 500 MG PO CAPS: 500.0000 mg | ORAL_CAPSULE | Freq: Four times a day (QID) | ORAL | Status: AC

## 2014-01-03 MED ORDER — POTASSIUM CHLORIDE 20 MEQ/15ML (10%) PO LIQD
40.0000 meq | Freq: Once | ORAL | Status: AC
  Administered 2014-01-03: 40 meq via ORAL
  Filled 2014-01-03: qty 30

## 2014-01-03 MED ORDER — SODIUM CHLORIDE 0.9 % IV SOLN
INTRAVENOUS | Status: DC
  Administered 2014-01-03: 18:00:00 via INTRAVENOUS

## 2014-01-03 NOTE — ED Notes (Signed)
Pt here for altered mental status, decreased appetite, tremble in mouth, per care giver, symptoms are typical when patient has UTI

## 2014-01-03 NOTE — Discharge Instructions (Signed)
°Emergency Department Resource Guide °1) Find a Doctor and Pay Out of Pocket °Although you won't have to find out who is covered by your insurance plan, it is a good idea to ask around and get recommendations. You will then need to call the office and see if the doctor you have chosen will accept you as a new patient and what types of options they offer for patients who are self-pay. Some doctors offer discounts or will set up payment plans for their patients who do not have insurance, but you will need to ask so you aren't surprised when you get to your appointment. ° °2) Contact Your Local Health Department °Not all health departments have doctors that can see patients for sick visits, but many do, so it is worth a call to see if yours does. If you don't know where your local health department is, you can check in your phone book. The CDC also has a tool to help you locate your state's health department, and many state websites also have listings of all of their local health departments. ° °3) Find a Walk-in Clinic °If your illness is not likely to be very severe or complicated, you may want to try a walk in clinic. These are popping up all over the country in pharmacies, drugstores, and shopping centers. They're usually staffed by nurse practitioners or physician assistants that have been trained to treat common illnesses and complaints. They're usually fairly quick and inexpensive. However, if you have serious medical issues or chronic medical problems, these are probably not your best option. ° °No Primary Care Doctor: °- Call Health Connect at  832-8000 - they can help you locate a primary care doctor that  accepts your insurance, provides certain services, etc. °- Physician Referral Service- 1-800-533-3463 ° °Chronic Pain Problems: °Organization         Address  Phone   Notes  °[facility]  (336) 297-2271 Patients need to be referred by their primary care doctor.  ° °Medication  Assistance: °Organization         Address  Phone   Notes  °[place] [facility] [address] °[place], [place] [address] (336) 641-8030 --Must be a resident of [place] °-- Must have NO insurance coverage whatsoever (no Medicaid/ Medicare, etc.) °-- The pt. MUST have a primary care doctor that directs their care regularly and follows them in the community °  °[facility]  (866) 331-1348   °[employer]  (888) 892-1162   ° °Agencies that provide inexpensive medical care: °Organization         Address  Phone   Notes  °[facility]  (336) 832-8035   °[facility]    (336) 832-7272   °[facility] [address] °[place], [place] [address] (336) 832-4777   °[facility] [address], °[place] (336) 271-4999   °[facility]    (336) 373-0678   °[facility]    (336) 272-1050   °[facility] ° [address], [place] Phone:  (336) 832-4444, Fax:  (336) 832-4440 Hours of Operation:  9 am - 6 pm, M-F.  Also accepts Medicaid/Medicare and self-pay.  °[facility] ° [address], [place] Phone: (336) 832-3150, Fax: (336) 832-3151. Hours of Operation:  8:30 am - 5:30 pm, M-F.  Also accepts Medicaid and self-pay.  °[facility] [place] [address], [place] Phone: (336) 878-6027   °[facility] [address], [place], [place] (336)723-1848, Ext. 123 Mondays & Thursdays: 7-9 AM.  First 15 patients are seen on a first come, first serve basis. °  ° °Medicaid-accepting [place] Providers: ° °Organization         Address  Phone   Notes  °[facility] [address], [place] (336) 641-2100 Also accepts self-pay patients.  °[facility] [address], [place] ° (336) 856-9996   °[facility] [address], [place]  (336) 288-8857   °[facility] [address] [place] [address], [place] (336) 299-7000   °Veita Bland [address], [place]  ° (336) 373-1557 Only accepts [place] Access Medicaid patients after they have their name applied to their card.  ° °Self-Pay (no insurance) in [place]: ° °Organization         Address  Phone   Notes  °Sickle Cell Patients, [facility] [address], [place] (336) 832-1970   °[facility] [address], [place] (336) 832-4400   °[facility] ° [address], [place] (336) 992-4800   °[facility]/Dr. Osei-Bonsu ° [address] [place] [address], [place] or [address], [place] (336) 841-8500 Phone number for both [place] and [place] locations is the same.  °[facility] [address], [place] (336) 299-0000   °[facility] [address] [place] [address], [place] or [address] (336) 852-7530 °(336) 878-2260   °[facility] [address], [place] (336) 350-1642, phone; (336) 294-5005, fax Sees patients 1st and 3rd Saturday of every month.  Must not qualify for public or private insurance (i.e. Medicaid, Medicare, [place] Health Choice, Veterans' Benefits) • Household income should be no more than 200% of the poverty level •The clinic cannot treat you if you are pregnant or think you are pregnant • Sexually transmitted diseases are not treated at the clinic.  ° ° °Dental Care: °Organization         Address  Phone  Notes  °[place] [facility] [address], [place] (336) 641-6152 Accepts children up to age 21 who are enrolled in Medicaid or [place] Health Choice; pregnant women with a Medicaid card; and children who have applied for Medicaid or [place] Health Choice, but were declined, whose parents can pay a reduced fee at time of service.  °[place]  [facility] [place]  [address], [place] (336) 641-7733 Accepts children up to age 21 who are enrolled in Medicaid or [place] Health Choice; pregnant women with a Medicaid card; and children who have applied for Medicaid or [place] Health Choice, but were declined, whose parents can pay a reduced fee at time of service.  °[facility] ° [address],  (336) 641-4533 Patients are seen by appointment only. Walk-ins are not accepted. [facility] will see patients 18 years of age and older. °Monday - Tuesday (8am-5pm) °Most Wednesdays (8:30-5pm) °$30 per visit, cash only  °[facility] ° [address], [place] (336) 641-4533 Patients are seen by appointment only. Walk-ins are not accepted. [facility] will see patients 18 years of age and older. °One   Wednesday Evening (Monthly: Volunteer Based).  $30 per visit, cash only  °[facility]  (919) 537-3737 for adults; Children under age 4, call Graduate Pediatric Dentistry at (919) [phone]. Children aged 4-14, please call (919) 537-3737 to request a pediatric application. ° Dental services are provided in all areas of dental care including fillings, crowns and bridges, complete and partial dentures, implants, gum treatment, root canals, and extractions. Preventive care is also provided. Treatment is provided to both adults and children. °Patients are selected via a lottery and there is often a waiting list. °  °[facility] [address], °[place] ° (336) 763-8833 [email or web address] °  °[facility] [address], [place], [place] (336)723-1848, Ext. 123 Second and Fourth Thursday of each month, opens at 6:30 AM; Clinic ends at 9 AM.  Patients are seen on a first-come first-served basis, and a limited number are seen during each clinic.  ° °[facility] ° [address], [place], [place] (336) 723-7904    Eligibility Requirements °You must have lived in [place], or [place] counties for at least the last three months. °  You cannot be eligible for state or federal sponsored healthcare insurance, including Veterans Administration, Medicaid, or Medicare. °  You generally cannot be eligible for healthcare insurance through your employer.  °  How to apply: °Eligibility screenings are held every Tuesday and Wednesday afternoon from 1:00 pm until 4:00 pm. You do not need an appointment for the interview!  °[facility] [address], [place], [place] 336-631-2330   °[facility] [facility]  336-342-8273   °[facility]  336-703-3100   °[facility]  336-570-6415   ° °Behavioral Health Resources in the Community: °Intensive Outpatient Programs °Organization         Address  Phone  Notes  °[facility] [address], [place], [place] 336-878-6098   °[facility] [address], [place], [place] 336-832-9800   °[facility] [address], [place], [place] ° 336-882-2125   °[facility] [address],  °[place], [place] 1-800-853-5163 or 336-641-4981   °Substance Abuse Resources °Organization         Address  Phone  Notes  °[facility]  336-882-2125   °[facility]  336-784-9470   °[facility]  336-285-9073   °[facility]  336-845-3988   °Residential & Outpatient Substance Abuse Program  1-800-659-3381   °Psychological Services °Organization         Address  Phone  Notes  °[facility]  336- 832-9600   °[facility]  336- 378-7881   °[facility] [address], [place] 1-800-853-5163 or 336-641-4981   ° °Mobile Crisis Teams °Organization         Address  Phone  Notes  °[facility], Mobile Crisis Care Unit  1-877-626-1772   °Assertive °[facility] ° [address]  [place], [place] 336-834-9664   °Sharon DeEsch [address] °[place] 336-554-5454   ° °Self-Help/Support Groups °Organization         Address  Phone             Notes  °[facility] [place] - variety of support groups  336- 373-1402 Call for more information  °Narcotics Anonymous (NA), [facility] [address], °[place]  2 meetings at this location  ° °  Residential Treatment Programs °Organization         Address  Phone  Notes  °[facility] [address],    °[place]  1-866-801-8205   °[facility] ° [address], [place], [place] 704-293-8524   °[facility] [address], [place] 336-845-3988 Admissions: 8am-3pm M-F  °[facility] [address]    °[place], [place] 336-841-1104   °[facility] [address], [place], [place] 336-379-7146   °[facility] [address]  °[place], [place] 336-285-9073   °[facility] Intensive Outpatient [address], [place], [place] 336-852-3033   °[facility] [address]  °[place], [place] 1-877-615-2722 or 336-784-9470   °[facility] [address] [place], [place] 336-227-7417 Accepts Medicaid  °[facility] [address]  °[place] 1-800-659-3381 Substance Abuse/Addiction Treatment  ° °[place] Behavioral Health Resources °Organization         Address  Phone  Notes  °[facility]  (888) 581-9988   °Julie Brannon, PhD [address] [place], [place]   (336) 349-5553 or (336) 951-0000   °[facility]   [address] °[place], [place] (336) 349-4454   °[facility] [address], [place], [place] (336) 342-8316 Insurance/Medicaid/sponsorship through [facility]  °[facility] [address]                                    [place], [place] (336) 342-8316 Therapy/tele-psych/case    °[facility] [address]  ° [place], [place] (336) 349-2233    °Dr. Arfeen  (336) 349-4544   °[facility] [place]  [employer] [place] [facility] 1) [address],  °2) [address], [place] °3)  [address], [place] (336) 349-3220 °(336) 342-7768 ° °(336) 342-8140   °[place] Child Abuse [facility] (336) 342-1394 or (336) 342-3537 (After Hours)    ° ° °Take the prescription as directed.  Call your regular medical doctor tomorrow to schedule a follow up appointment within the next 2 days.  Return to the Emergency Department immediately sooner if worsening.  ° °

## 2014-01-03 NOTE — ED Notes (Signed)
Husband at bedside to offer pt fluids, pt consumed 250 cc of water with no problem.

## 2014-01-03 NOTE — ED Provider Notes (Signed)
CSN: [id number]     Arrival date & time 01/03/14  1334 History   First MD Initiated Contact with Patient 01/03/14 1708     Chief Complaint  Patient presents with  . Urinary Tract Infection  . Altered Mental Status      Patient is a 73 y.o. female presenting with urinary tract infection and altered mental status. The history is provided by a caregiver and a relative. The history is limited by the condition of the patient (Hx dementia).  Urinary Tract Infection  Altered Mental Status Pt was seen at 1710.  Per pt's family, c/o gradual onset and persistence of constant AMS from her usual baseline dementia for the past 2 days. Pt's family states she also has not been eating for the past 2 days. They noted her home temp to be "99.5." Pt's family states pt "usually acts like this when she gets a UTI." Denies vomiting/diarrhea, no falls, no cough/SOB. Pt has significant hx of dementia and is essentially non-verbal (occasionally will say "yes" and "no") and bedridden/wheelchair bound, per her baseline.    Past Medical History  Diagnosis Date  . Diabetes mellitus   . Diabetic neuropathy   . Hypothyroidism   . Hypertension   . GERD (gastroesophageal reflux disease)   . Heart murmur     not treated for  . Arthritis   . Chronic kidney disease     RENAL TRANSPLANT IN 2008--PT IS FOLLOWED BY DR. Karie Fetch WITH BUN OF 37 AND CREAT 1.7--PER NOTE DR. NESI FROM 04/11/12  . Urethral diverticulum   . Eyesight diminished     GLAUCOMA   . History of shingles     LEFT EYE JAN 2012--STILL HAS EYE PAIN-STATES NOT ABLE TO TAKE THE MEDICATION FOR SHINGLES BECAUSE OF HER HX OF KIDNEY TRANSPLANT  . Fingernail abnormalities     FUNGUS OF FINGERNAILS  . UTI (urinary tract infection) 05/18/2013  . UTI (lower urinary tract infection)   . Dementia   . Bedridden     wheelchair   Past Surgical History  Procedure Laterality Date  . Kidney transplant  2008    At [facility]  . Back surgery       ruptured disk  . Abdominal hysterectomy    . Appendectomy    . Laser surgery of both eyes for hemorrhages    . 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 Urethral diverticulectomy  04/28/2012    Procedure: URETHRAL DIVERTICULECTOMY;  Surgeon: Antony Haste, MD;  Location: [facility];  Service: Urology;  Laterality: N/A;   Family History  Problem Relation Age of Onset  . Anesthesia problems Neg Hx    History  Substance Use Topics  . Smoking status: Former Smoker -- 0.50 packs/day for 20 years    Quit date: 10/21/1978  . Smokeless tobacco: Never Used  . Alcohol Use: No    Review of Systems  Unable to perform ROS: Dementia      Allergies  Adhesive; Aspirin; Hydrocodone; Iohexol; Oxycodone; Penicillins; Tacrolimus; and Contrast media  Home Medications   Current Outpatient Rx  Name  Route  Sig  Dispense  Refill  . allopurinol (ZYLOPRIM) 300 MG tablet   Oral   Take 150 mg by mouth daily.         Marland Kitchen amLODipine (NORVASC) 10 MG tablet   Oral   Take 1 tablet (10 mg total) by mouth daily with breakfast.   30 tablet   0   . calcitRIOL (ROCALTROL) 0.5  MCG capsule   Oral   Take 1 capsule (0.5 mcg total) by mouth daily with breakfast.   30 capsule   0   . furosemide (LASIX) 40 MG tablet   Oral   Take 80 mg by mouth 2 (two) times daily.         Marland Kitchen. gabapentin (NEURONTIN) 300 MG capsule   Oral   Take 300 mg by mouth 4 (four) times daily.         . insulin detemir (LEVEMIR) 100 UNIT/ML injection   Subcutaneous   Inject 18 Units into the skin every morning.         . insulin lispro (HUMALOG) 100 UNIT/ML injection   Subcutaneous   Inject 0-4 Units into the skin 3 (three) times daily before meals. Per sliding scale         . labetalol (NORMODYNE) 200 MG tablet   Oral   Take 1.5 tablets (300 mg total) by mouth 3 (three) times daily. Takes 1 and 1/2 tabs   120 tablet   0   . latanoprost (XALATAN) 0.005 % ophthalmic solution   Both Eyes   Place 1 drop into both  eyes at bedtime.         Marland Kitchen. levothyroxine (SYNTHROID, LEVOTHROID) 112 MCG tablet   Oral   Take 2 tablets (224 mcg total) by mouth daily with breakfast.   60 tablet   0   . loperamide (IMODIUM) 2 MG capsule   Oral   Take 2 mg by mouth as needed for diarrhea or loose stools.         . mycophenolate (CELLCEPT) 250 MG capsule   Oral   Take 2 capsules (500 mg total) by mouth 2 (two) times daily.   120 capsule   0   . potassium chloride SA (K-DUR,KLOR-CON) 20 MEQ tablet   Oral   Take 1 tablet (20 mEq total) by mouth daily.   14 tablet   0   . predniSONE (DELTASONE) 10 MG tablet   Oral   Take 10 mg by mouth daily with breakfast.         . ranitidine (ZANTAC) 150 MG tablet   Oral   Take 150 mg by mouth daily.         . sirolimus (RAPAMUNE) 1 MG tablet   Oral   Take 3 tablets (3 mg total) by mouth daily. Pt takes three 1 mg tablets = 3 mg daily   90 tablet   0   . Vitamin D, Ergocalciferol, (DRISDOL) 50000 UNITS CAPS capsule   Oral   Take 50,000 Units by mouth every 7 (seven) days.           BP 118/68  Pulse 69  Temp(Src) 98.5 F (36.9 C) (Oral)  Resp 18  Ht 5' (1.524 m)  Wt 135 lb (61.236 kg)  BMI 26.37 kg/m2  SpO2 99% Physical Exam 1715: Physical examination:  Nursing notes reviewed; Vital signs and O2 SAT reviewed;  Constitutional: Thin, frail. In no acute distress; Head:  Normocephalic, atraumatic; Eyes: EOMI, PERRL, No scleral icterus; ENMT: Mouth and pharynx normal, Mucous membranes dry; Neck: Supple, Full range of motion, No lymphadenopathy; Cardiovascular: Regular rate and rhythm, No gallop; Respiratory: Breath sounds clear & equal bilaterally, No wheezes.  Speaking full sentences with ease, Normal respiratory effort/excursion; Chest: Nontender, Movement normal; Abdomen: Soft, Nontender, Nondistended, Normal bowel sounds; Genitourinary: No CVA tenderness; Extremities: Pulses normal, No tenderness, No edema, No calf edema or asymmetry.; Neuro: Awake,  alert, eyes open spontaneously, non-verbal per baseline. Moves all extremities on stretcher spontaneously. Bedbound per hx.; Skin: Color normal, Warm, Dry.   ED Course  Procedures     EKG Interpretation   Date/Time:  Monday January 03 2014 18:52:37 EDT Ventricular Rate:  71 PR Interval:  185 QRS Duration: 112 QT Interval:  464 QTC Calculation: 504 R Axis:   -60 Text Interpretation:  Sinus rhythm Left anterior fascicular block Anterior  infarct, old Nonspecific T wave abnormality Lateral leads Prolonged QT  interval When compared with ECG of 11/22/2013 No significant change was  found Confirmed by [facility]  MD, Nicholos Johns [phone]) on 01/03/2014 7:41:29 PM      MDM  MDM Reviewed: previous chart, nursing note and vitals Reviewed previous: labs and ECG Interpretation: labs, ECG and x-ray   Results for orders placed during the hospital encounter of 01/03/14  CBC      Result Value Ref Range   WBC 4.2  4.0 - 10.5 K/uL   RBC 4.00  3.87 - 5.11 MIL/uL   Hemoglobin 10.7 (*) 12.0 - 15.0 g/dL   HCT 19.1 (*) 47.8 - 29.5 %   MCV 84.5  78.0 - 100.0 fL   MCH 26.8  26.0 - 34.0 pg   MCHC 31.7  30.0 - 36.0 g/dL   RDW 62.1 (*) 30.8 - 65.7 %   Platelets 220  150 - 400 K/uL  COMPREHENSIVE METABOLIC PANEL      Result Value Ref Range   Sodium 140  137 - 147 mEq/L   Potassium 3.1 (*) 3.7 - 5.3 mEq/L   Chloride 96  96 - 112 mEq/L   CO2 30  19 - 32 mEq/L   Glucose, Bld 258 (*) 70 - 99 mg/dL   BUN 49 (*) 6 - 23 mg/dL   Creatinine, Ser 8.46 (*) 0.50 - 1.10 mg/dL   Calcium 96.2  8.4 - 95.2 mg/dL   Total Protein 6.2  6.0 - 8.3 g/dL   Albumin 3.3 (*) 3.5 - 5.2 g/dL   AST 14  0 - 37 U/L   ALT 10  0 - 35 U/L   Alkaline Phosphatase 47  39 - 117 U/L   Total Bilirubin 0.8  0.3 - 1.2 mg/dL   GFR calc non Af Amer 45 (*) >90 mL/min   GFR calc Af Amer 52 (*) >90 mL/min  LACTIC ACID, PLASMA      Result Value Ref Range   Lactic Acid, Venous 1.0  0.5 - 2.2 mmol/L  TROPONIN I      Result Value Ref Range    Troponin I <0.30  <0.30 ng/mL  URINALYSIS, ROUTINE W REFLEX MICROSCOPIC      Result Value Ref Range   Color, Urine YELLOW  YELLOW   APPearance CLOUDY (*) CLEAR   Specific Gravity, Urine 1.020  1.005 - 1.030   pH 5.0  5.0 - 8.0   Glucose, UA 250 (*) NEGATIVE mg/dL   Hgb urine dipstick NEGATIVE  NEGATIVE   Bilirubin Urine NEGATIVE  NEGATIVE   Ketones, ur NEGATIVE  NEGATIVE mg/dL   Protein, ur 841 (*) NEGATIVE mg/dL   Urobilinogen, UA 0.2  0.0 - 1.0 mg/dL   Nitrite NEGATIVE  NEGATIVE   Leukocytes, UA SMALL (*) NEGATIVE  URINE MICROSCOPIC-ADD ON      Result Value Ref Range   Squamous Epithelial / LPF FEW (*) RARE   WBC, UA 11-20  <3 WBC/hpf   Bacteria, UA MANY (*) RARE   Casts HYALINE  CASTS (*) NEGATIVE  CBG MONITORING, ED      Result Value Ref Range   Glucose-Capillary 207 (*) 70 - 99 mg/dL  CBG MONITORING, ED      Result Value Ref Range   Glucose-Capillary 283 (*) 70 - 99 mg/dL   Dg Chest Port 1 View 01/03/2014   CLINICAL DATA:  Urinary tract infection.  Altered mental status.  EXAM: PORTABLE CHEST - 1 VIEW  COMPARISON:  11/22/2013  FINDINGS: The heart size and mediastinal contours are within normal limits. Both lungs are clear. The visualized skeletal structures are unremarkable.  IMPRESSION: No active disease.   Electronically Signed   By: Signa Kell M.D.   On: 01/03/2014 18:21   Results for KATRYN, PLUMMER (MRN [id number]) as of 01/03/2014 22:39  Ref. Range 11/22/2013 08:35 11/23/2013 06:05 11/25/2013 04:54 01/03/2014 13:53  BUN Latest Range: 6-23 mg/dL 33 (H) 33 (H) 37 (H) 49 (H)  Creatinine Latest Range: 0.50-1.10 mg/dL 4.09 (H) 8.11 (H) 9.14 (H) 1.16 (H)     2105:  Potassium repleted PO. Pt has tol PO well while in the ED without N/V. BUN/Cr per baseline. +UTI, UC pending. Will dose IV rocephin while in the ED (has taken previously without adverse reaction). T/C to Dr. Wylene Simmer (on call for Dr. Evlyn Kanner), case discussed, including:  HPI, pertinent PM/SHx, VS/PE, dx testing, ED course  and treatment:  Agreeable with ED tx and plan, requests to d/c pt with rx keflex and office with f/u with pt tomorrow.  2215:  IV rocephin infused. Pt continues to tol PO well without N/V. VS remain stable. Dx and testing, as well as d/w Dr. Wylene Simmer, d/w pt's family.  Questions answered.  Verb understanding, agreeable to d/c home with outpt f/u.       Laray Anger, DO 01/07/14 1230

## 2014-01-04 LAB — URINE CULTURE
COLONY COUNT: NO GROWTH
Culture: NO GROWTH

## 2014-03-14 IMAGING — RF DG VCUG
10 series · 10 of 10 positions shown · non-contrast
Comparison: 05/26/2012.

CLINICAL DATA: Urethral diverticulum excision.

VOIDING CYSTOURETHROGRAM:
TECHNIQUE: A preexisting Foley catheter was used to perform
cystogram.  Sterile technique was observed throughout.  Urinary
bladder was filled with 275 ml of Cystografin 30% by gravity
infusion.  Intermittent fluoroscopy was used.
Fluoroscopy time:  2 minutes 48 seconds pulsed dose fluoroscopy.

[Series 1: run · 1 of 1 slices shown (1 of 10)]
[im 1/1]
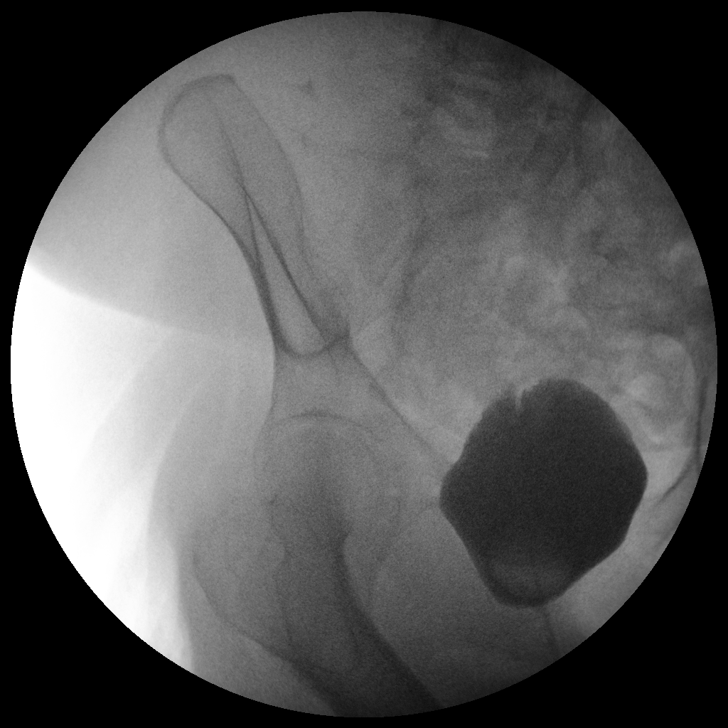

[Series 2: run · 1 of 1 slices shown (2 of 10)]
[im 1/1]
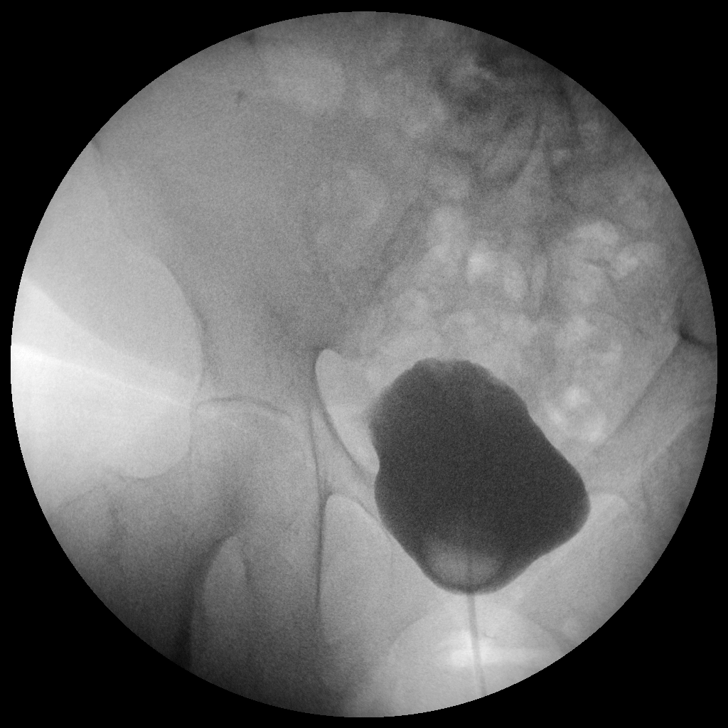

[Series 3: run · 1 of 1 slices shown (3 of 10)]
[im 1/1]
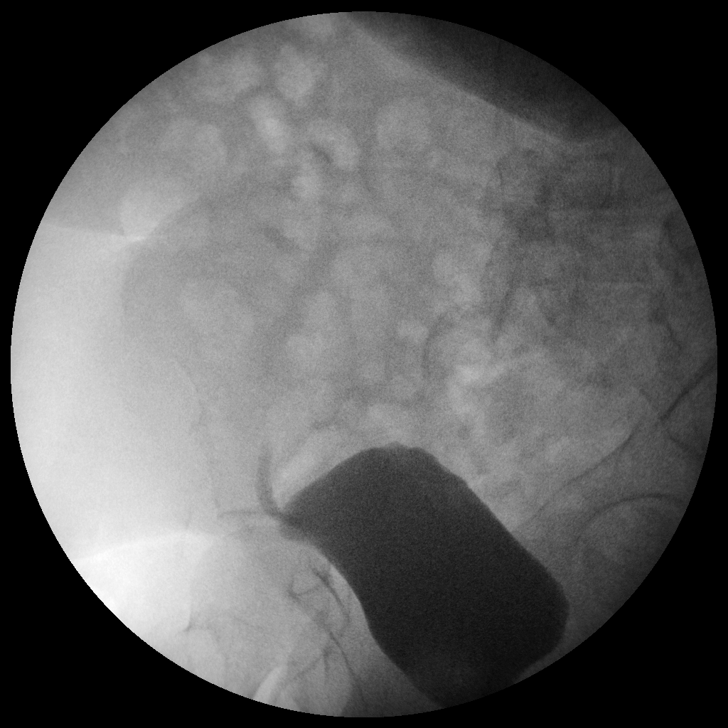

[Series 4: run · 1 of 1 slices shown (4 of 10)]
[im 1/1]
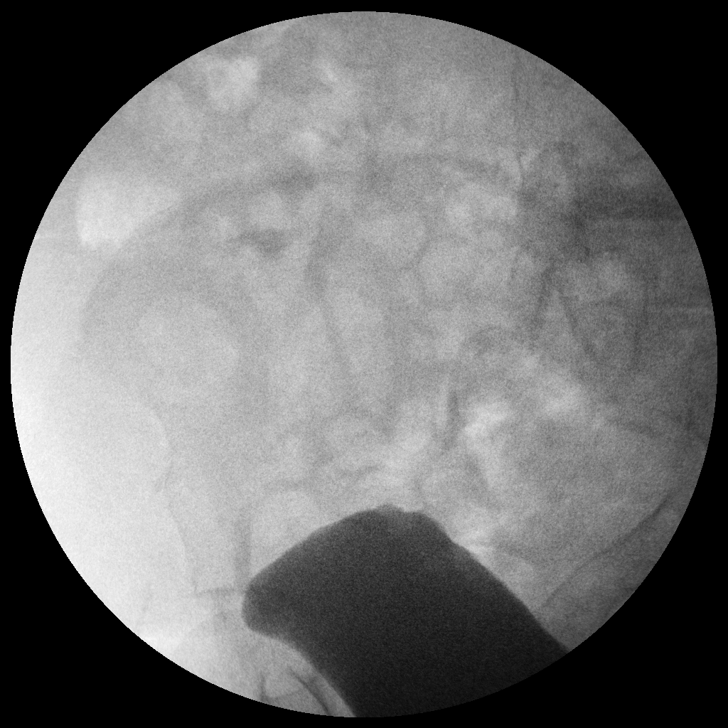

[Series 5: run · 1 of 1 slices shown (5 of 10)]
[im 1/1]
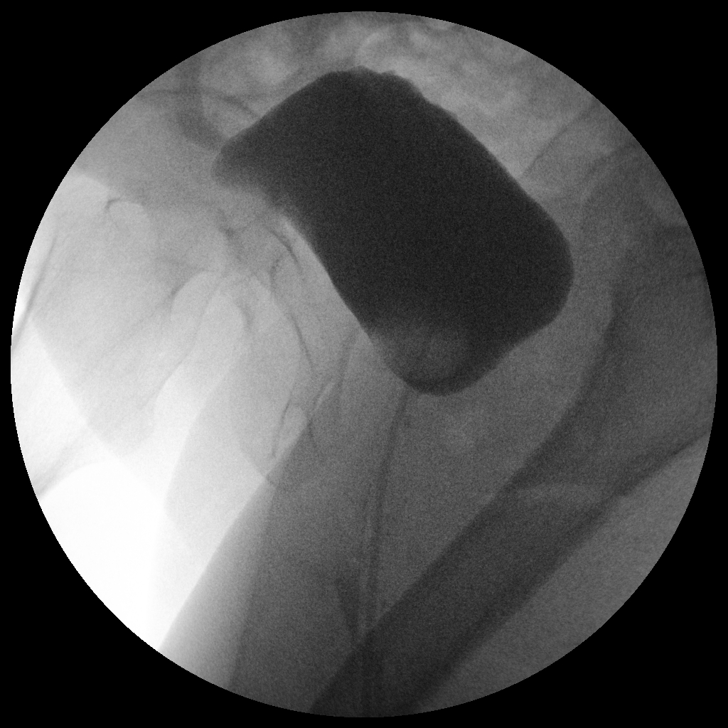

[Series 6: run · 1 of 1 slices shown (6 of 10)]
[im 1/1]
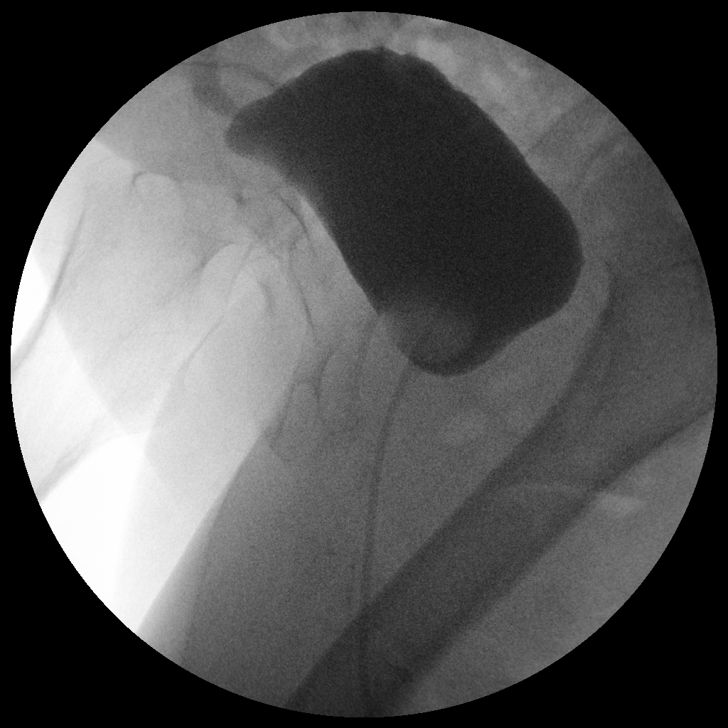

[Series 7: run · 1 of 1 slices shown (7 of 10)]
[im 1/1]
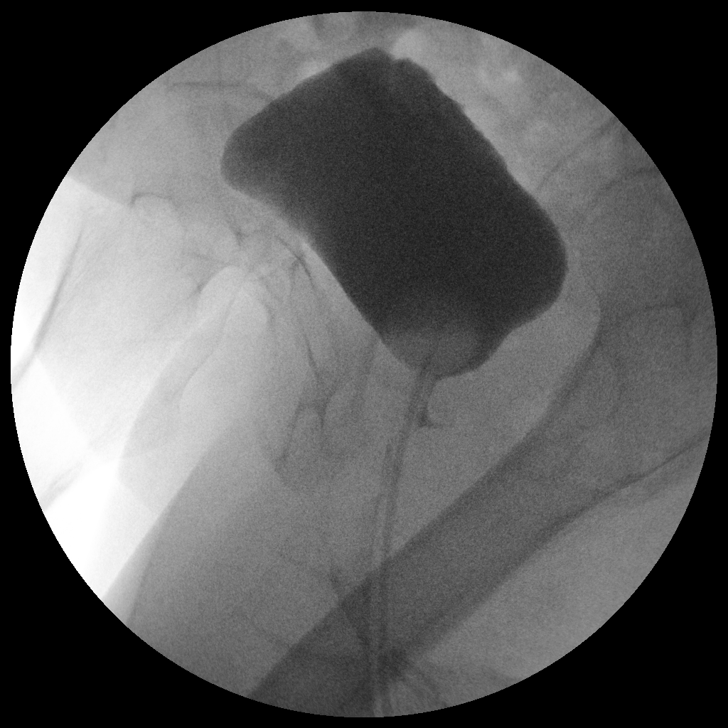

[Series 8: run · 1 of 1 slices shown (8 of 10)]
[im 1/1]
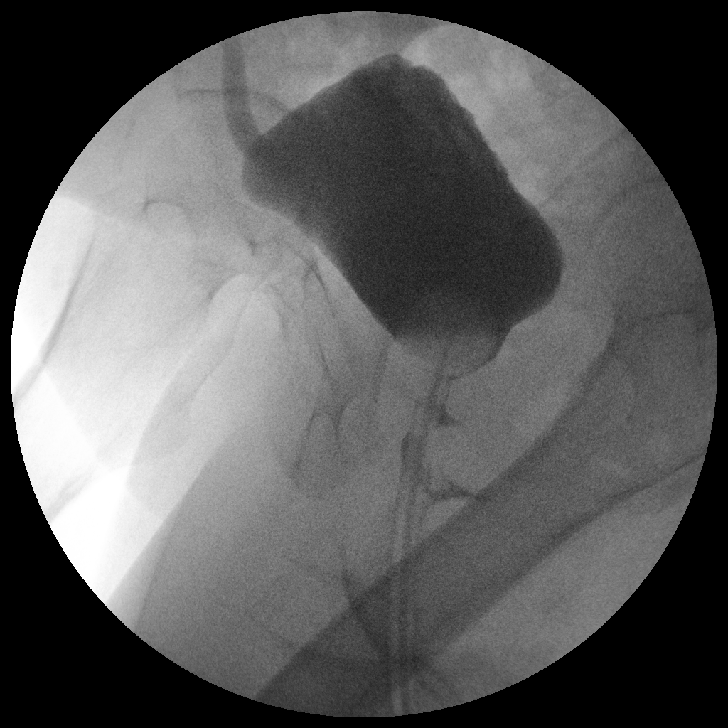

[Series 9: run · 1 of 1 slices shown (9 of 10)]
[im 1/1]
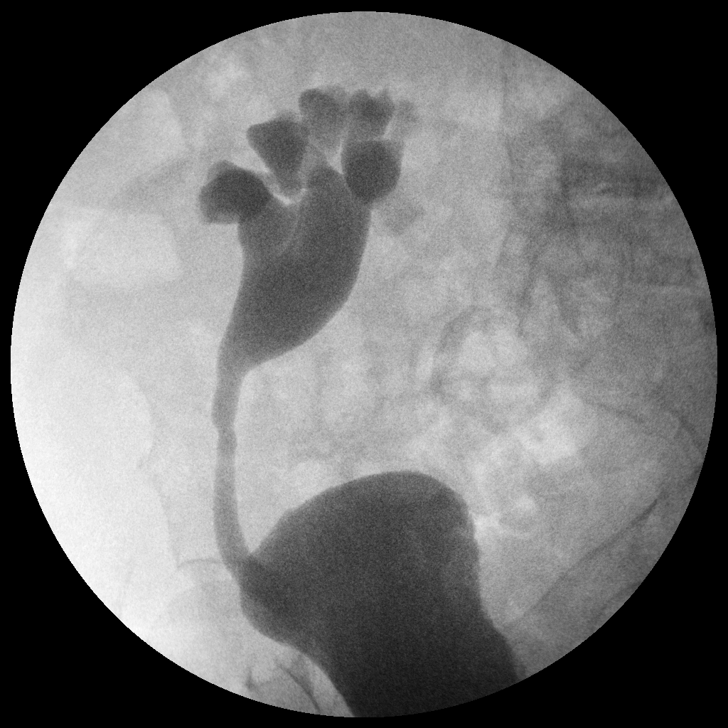

[Series 10: run · 1 of 1 slices shown (10 of 10)]
[im 1/1]
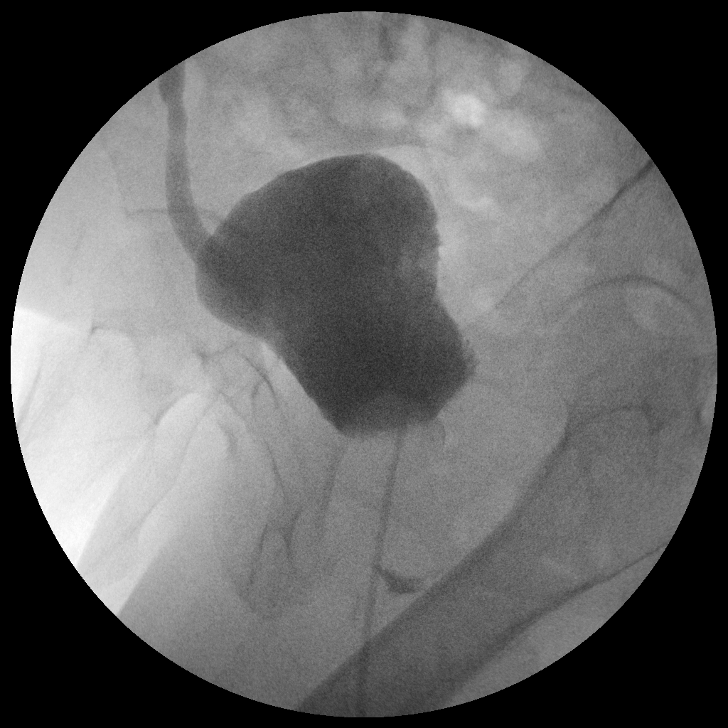

[10 of 10 positions shown; findings below may reference images not displayed]

FINDINGS: There is of tiny amount of filling of the previously seen
urethral diverticulum, decreased compared to the prior exam of
05/26/2012.  Vesicoureteral reflux is observed into the iliac fossa
renal allograft.  Urinary bladder shows mild posterior mural
irregularity, similar to prior.
IMPRESSION: Significant decrease in filling of the urethral diverticulum with
only a tiny amount of posterior filling observed on steep oblique.
Previously this demonstrated circumferential filling.  Foley
catheter was left in place.

## 2014-03-18 IMAGING — CR DG CHEST 2V
2 series · 2 of 2 positions shown · non-contrast
Comparison: Chest radiograph 06/02/2012 and 04/30/2012

CLINICAL DATA: Weakness and confusion

CHEST - 2 VIEW

[w chest lat]
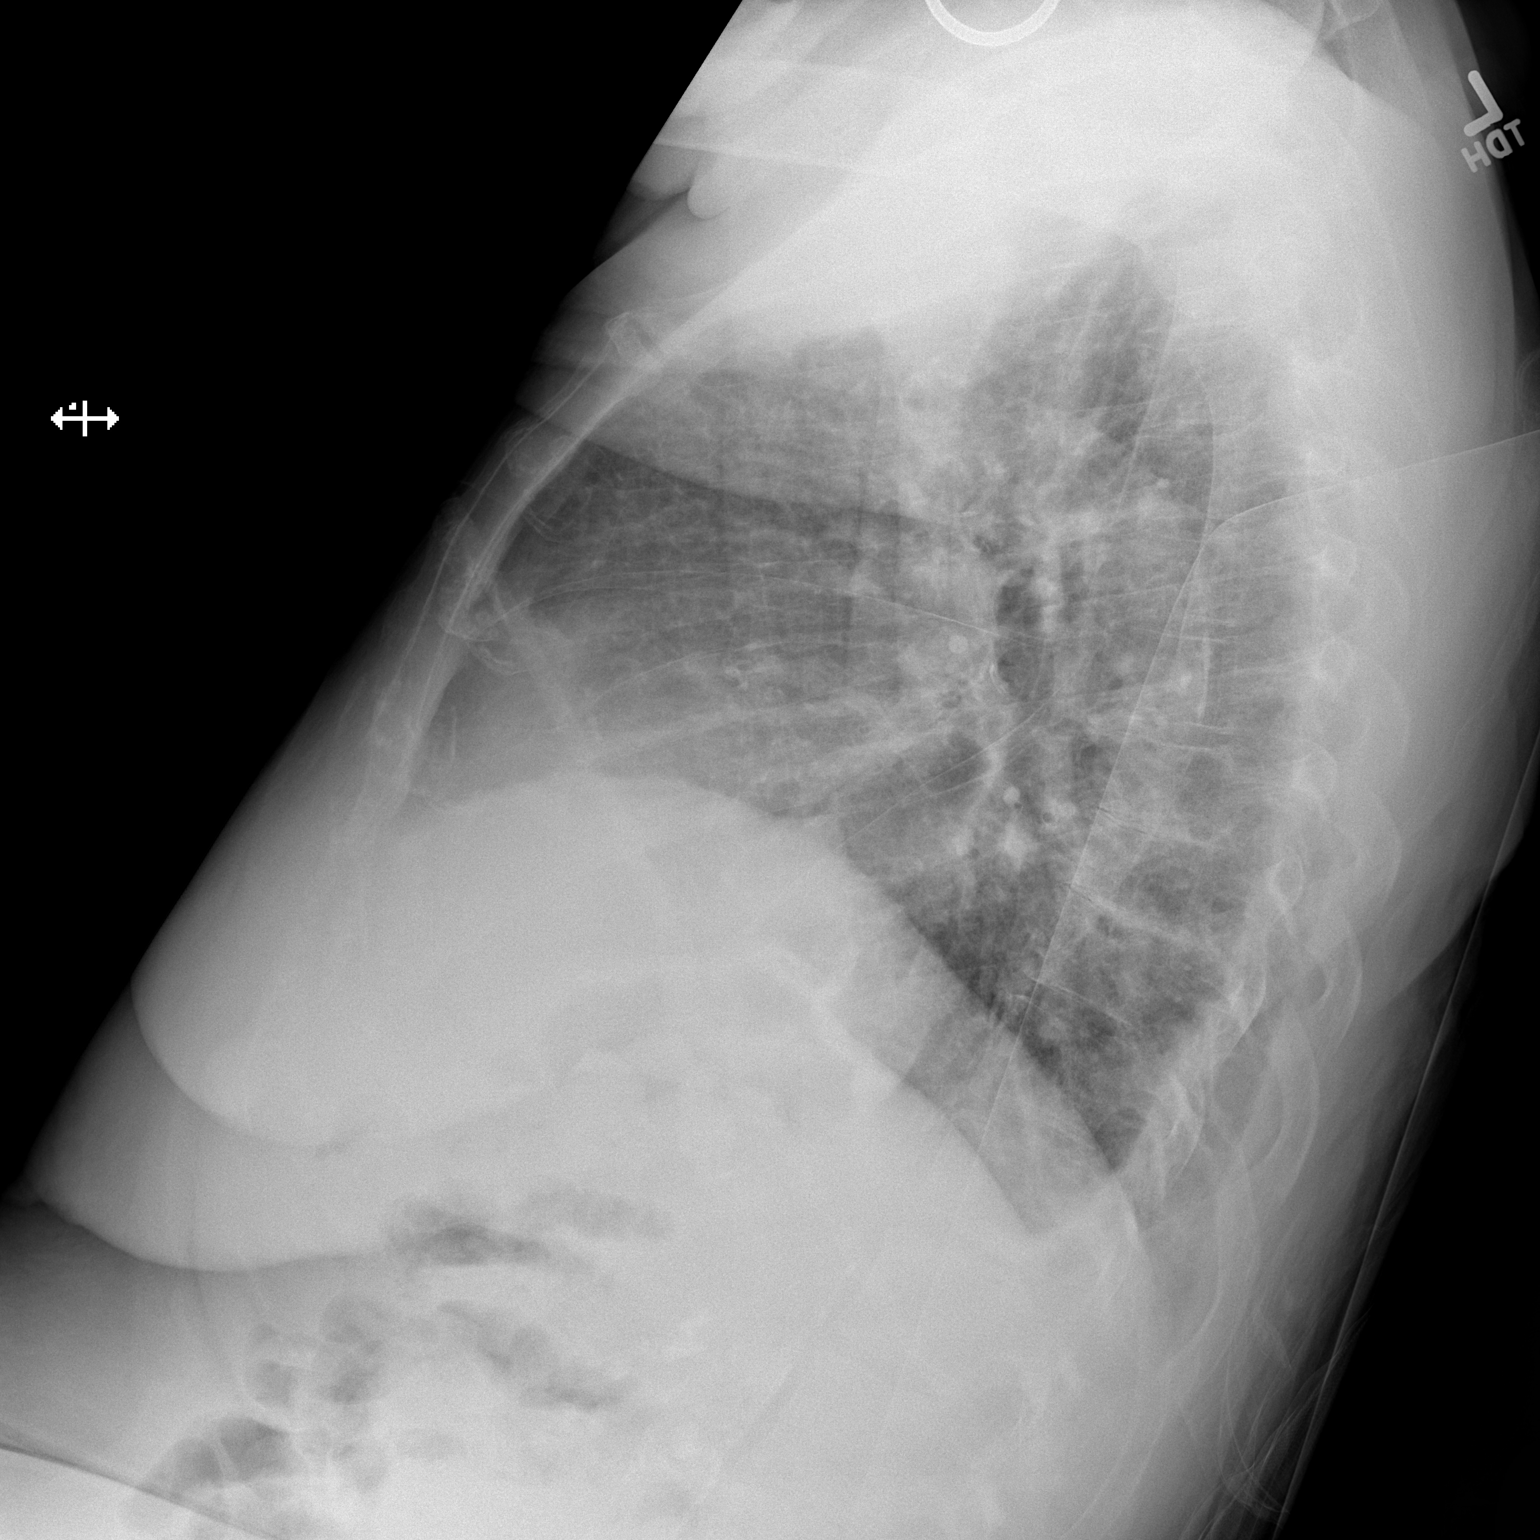

[x chest ap]
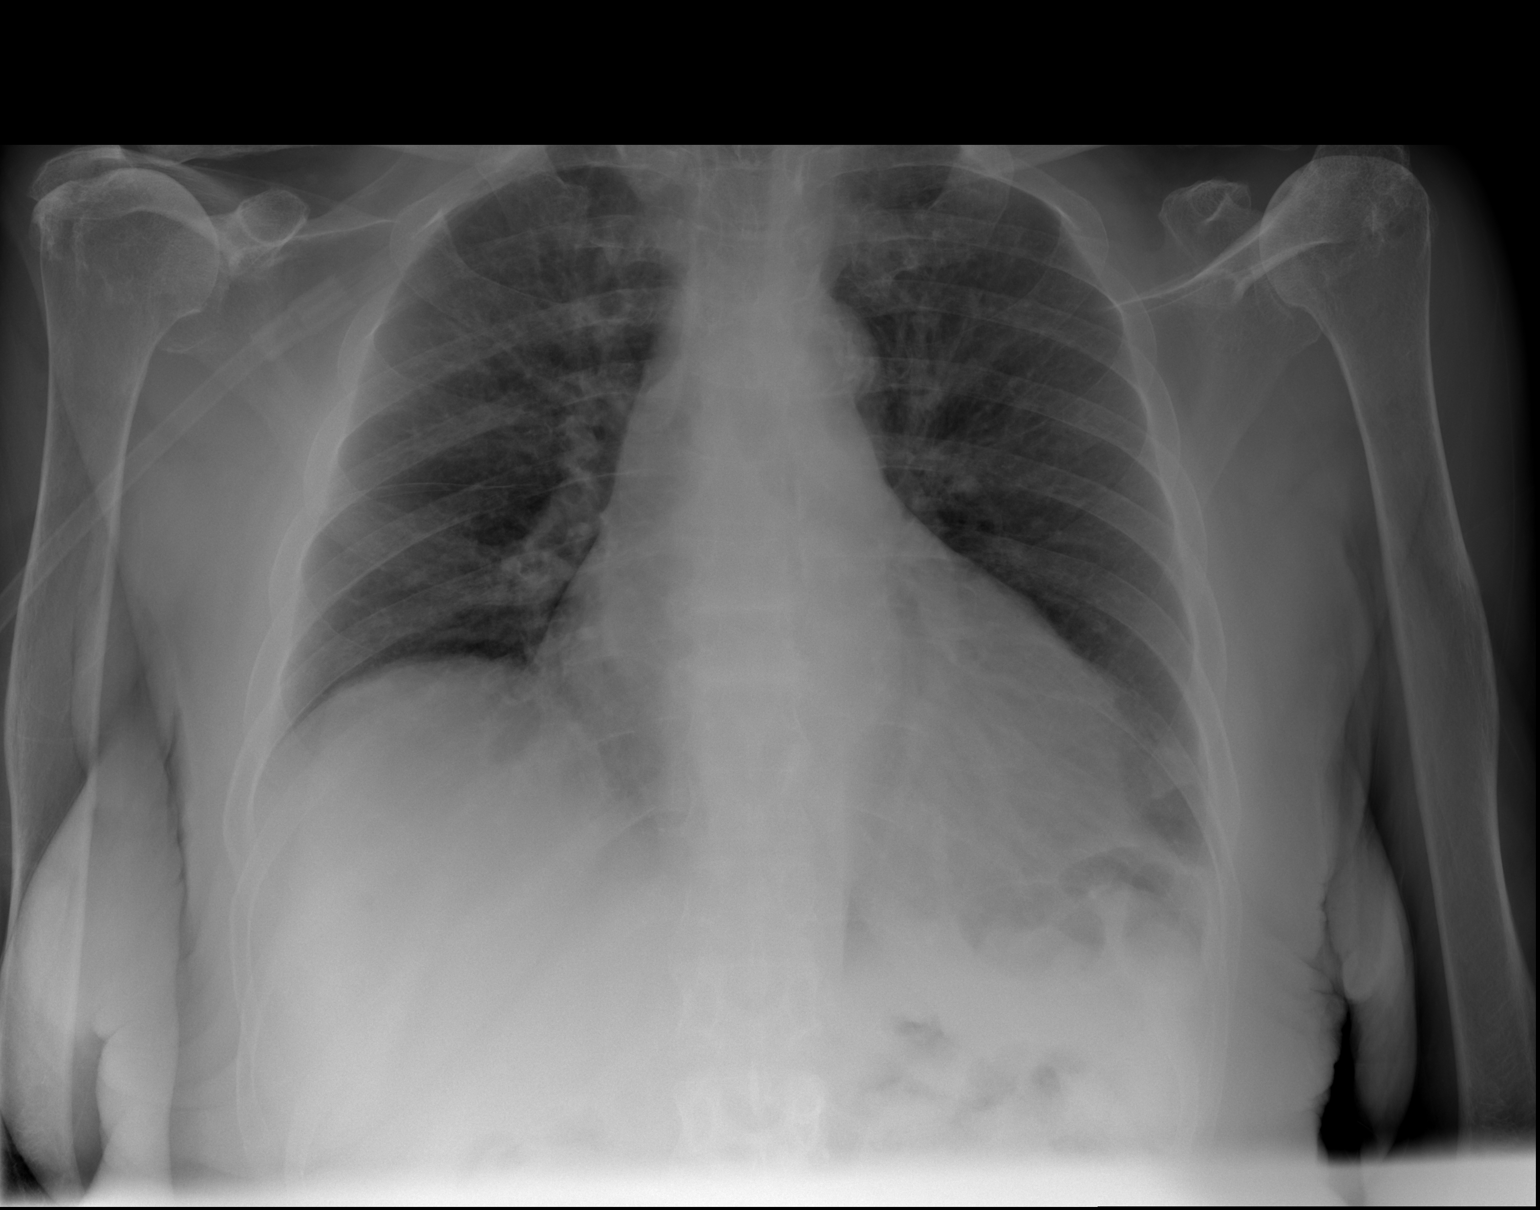

[2 of 2 positions shown; findings below may reference images not displayed]

FINDINGS: Stable cardiomegaly.  Atherosclerotic calcification of
the thoracic aortic arch.    Slight left basilar atelectasis.  Tiny
bilateral pleural effusions are suspected in the posterior
costophrenic angles on the lateral view. Bilateral high-riding
shoulders.  Surgical clips in the thyroid bed.
IMPRESSION: 1.  Cardiomegaly.
2.  Suspect tiny bilateral pleural effusions and slight left
basilar atelectasis.

## 2014-08-21 DEATH — deceased

## 2014-10-06 ENCOUNTER — Other Ambulatory Visit: Payer: Self-pay | Admitting: Nurse Practitioner

## 2014-12-26 NOTE — Progress Notes (Signed)
Date: 01/15/2013  MRN: [id number] Name: [name] Sex: female Age: 74 y.o. DOB:09-17-1941   [facility] #: [id number]  Facility/Room;[facility] Level Of Care:SNF Provider: Dr Murray Hodgkins  Emergency Contacts: Contact Information   Name Relation Home Work Mobile   [name] Spouse [phone]     [name] Daughter [phone]  [phone]      Code Status:Full  MOST Form:  Allergies: Allergies  Allergen Reactions  . Adhesive (Tape)     Skin irritation, "pulls the skin off"   . Aspirin     Crawling feeling under skin  . Hydrocodone Other (See Comments)    Hallucinations   . Iohexol     Desc: pt unable to tell what the reaction was   . Oxycodone     hallucinations  . Penicillins Hives and Swelling    Has tolerated cefazolin and ceftriaxone.  . Tacrolimus Other (See Comments)    Hallucinations   . Contrast Media (Iodinated Diagnostic Agents) Hives, Swelling and Rash     Chief Complaint  Patient presents with  . Medical Managment of Chronic Issues    New admit to SNF following hospitalization to [facility]    Admitted to [facility] 01/04/13 to 01/08/2013   [name] is a 74 year old white female with a history recurrent UTIs as well as multiple chronic issues including prior renal transplant, with diabetes, and chronic pain. She presented with a urinary tract infection with altered mental status. She had volume deficits repleted. She was treated with broad-spectrum antibiotics and the cultures returned showing enterococcus. She's now been transitioned to oral therapy. She should be placed back on long-term ciprofloxacin as prophylaxis after her nitrofurantoin is completed.  Her blood sugars have been variable as they often are in the hospital. This morning's fasting sugar is 83. She's had prior severe lows with encephalopathy so we try not  to run her sugars too tightly. Her blood pressure is been stable. Her oral intake has been good. Her bowels been mildly soft but showing no diarrhea.   Mobilizations been difficult and she's weak just getting to the bathroom. Due to this we are going to have her have a skilled nursing stay for attempted rehabilitation and improvement in strength.   Her chronic pain has been well-controlled. Her creatinine is back to her baseline of around 2.   She has some minor anemia.    Past Medical History  Diagnosis Date  . Diabetes mellitus   . Diabetic neuropathy   . Hypothyroidism   . Hypertension   . GERD (gastroesophageal reflux disease)   . Heart murmur     not treated for  . Arthritis   . Chronic kidney disease     RENAL TRANSPLANT IN 2008--PT IS FOLLOWED BY DR. Karie Russo WITH BUN OF 37 AND CREAT 1.7--PER NOTE [name] FROM 04/11/12  . Urethral diverticulum   . Eyesight diminished     GLAUCOMA   . History of shingles     LEFT EYE JAN 2012--STILL HAS EYE PAIN-STATES NOT ABLE TO TAKE THE MEDICATION FOR SHINGLES BECAUSE OF HER HX OF KIDNEY TRANSPLANT  . Fingernail abnormalities     FUNGUS OF FINGERNAILS   UTI Dehydration Weakness Aletred mental stats, improved Gout, on prophylaxis  Past Surgical History  Procedure Laterality Date  . Kidney transplant  2008    At [facility]  . Back surgery      ruptured disk  . Abdominal hysterectomy    . Appendectomy    . Laser  surgery of both eyes for hemorrhages    . 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 Urethral diverticulectomy  04/28/2012    Procedure: URETHRAL DIVERTICULECTOMY; Surgeon: Antony HasteMatthew Ramsey Eskridge, MD; Location: [facility]; Service: Urology; Laterality: N/A;     Procedures:Dg Chest 2 View  01/04/2013 *RADIOLOGY REPORT* Clinical Data: Fatigue. Generalized weakness. Shortness of breath. CHEST - 2 VIEW Comparison:  Two-view chest x-ray 08/22/2012, 06/27/2012, 04/30/2012. Findings: Cardiac silhouette moderately enlarged but stable. Pulmonary vascularity normal without evidence of pulmonary edema. Stable chronic elevation of the right hemidiaphragm. No confluent airspace consolidation. No pleural effusions. Degenerative changes involving the thoracic spine and the right shoulder joint. IMPRESSION: Stable cardiomegaly. No acute cardiopulmonary disease. Original Report Authenticated By: [name], M.D.    Consultants:Patient's WUJ:WJXBJ,YNWGNFAPCP:SOUTH,STEPHEN Erika DienerALAN, MD  Consults:Treatment Team: Erika BuddyMartin W Webb, MD and   Current Outpatient Prescriptions  Medication Sig Dispense Refill  . allopurinol (ZYLOPRIM) 150 mg TABS Take 0.5 tablets (150 mg total) by mouth daily with breakfast. 30 tablet 1  . amLODipine (NORVASC) 10 MG tablet Take 10 mg by mouth daily with breakfast.     . calcitRIOL (ROCALTROL) 0.5 MCG capsule Take 0.5 mcg by mouth daily with breakfast.     . calcium carbonate (TUMS - DOSED IN MG ELEMENTAL CALCIUM) 500 MG chewable tablet Chew 2 tablets by mouth 3 (three) times daily with meals.    . fentaNYL (DURAGESIC - DOSED MCG/HR) 12 MCG/HR Place 1 patch onto the skin every 3 (three) days. New patch was applied on Thursday    . fentaNYL (DURAGESIC - DOSED MCG/HR) 50 MCG/HR Place 1 patch onto the skin every 3 (three) days. New patch was applied on Thursday    . furosemide (LASIX) 40 MG tablet Take 40 mg by mouth 2 (two) times daily.    Marland Kitchen. gabapentin (NEURONTIN) 300 MG capsule Take 300 mg by mouth 3 (three) times daily.    . insulin detemir (LEVEMIR) 100 UNIT/ML injection Inject 0.24 mLs (24 Units total) into the skin daily with breakfast. 10 mL 0  . insulin lispro (HUMALOG) 100 UNIT/ML injection Inject 3-12 Units into the skin 3 (three) times daily before meals. Pt is on a sliding scale    . ketoconazole (NIZORAL) 2 % cream  Apply 1 application topically 2 (two) times daily.    Marland Kitchen. labetalol (NORMODYNE) 200 MG tablet Take 300 mg by mouth 3 (three) times daily. Takes 1 and 1/2 tabs    . latanoprost (XALATAN) 0.005 % ophthalmic solution Place 1 drop into the right eye at bedtime.    Marland Kitchen. levothyroxine (SYNTHROID, LEVOTHROID) 112 MCG tablet Take 224 mcg by mouth daily with breakfast.    . mycophenolate (CELLCEPT) 250 MG capsule Take 500 mg by mouth 2 (two) times daily.    . nitrofurantoin, macrocrystal-monohydrate, (MACROBID) 100 MG capsule Take 1 capsule (100 mg total) by mouth 2 (two) times daily. 10 capsule 0  . potassium chloride SA (K-DUR,KLOR-CON) 20 MEQ tablet Take 20 mEq by mouth 2 (two) times daily.    . predniSONE (DELTASONE) 5 MG tablet Take 10 mg by mouth daily with breakfast.     . ranitidine (ZANTAC) 150 MG tablet Take 150 mg by mouth daily.    . sirolimus (RAPAMUNE) 1 MG tablet Take 3 mg by mouth daily. Pt takes three 1 mg tablets = 3 mg daily    . urea (CARMOL) 40 % CREA Apply 1 application topically 2 (two) times daily. Apply to finger nails, and cuticles.  No current facility-administered medications for this visit.   Facility-Administered Medications Ordered in Other Visits  Medication Dose Route Frequency Provider Last Rate Last Dose  . clindamycin (CLEOCIN) 2 % vaginal cream 1 Applicatorful 1 Applicatorful Vaginal QHS Antony Haste, MD      Immunization History  Administered Date(s) Administered  . Pneumococcal Conjugate 08/22/2011    History  Substance Use Topics  . Smoking status: Former Smoker -- 0.50 packs/day for 20 years    Quit date: 10/21/1978  . Smokeless tobacco: Never Used  . Alcohol Use: No    Family History  Problem Relation Age of Onset  . Anesthesia problems Neg Hx    Review of Systems  Physical Exam  Constitutional: She is  oriented to person, place, and time. She appears well-developed and well-nourished. No distress.  HENT:  Right Ear: External ear normal.  Left Ear: External ear normal.  Nose: Nose normal.  Mouth/Throat: Oropharynx is clear and moist. No oropharyngeal exudate.  Significant deafness.  Eyes: Conjunctivae and EOM are normal. Pupils are equal, round, and reactive to light. No scleral icterus.  Left pinguecula. Proptosis.  Neck: No JVD present. No tracheal deviation present. No thyromegaly present.  Cardiovascular: Normal rate, regular rhythm, normal heart sounds and intact distal pulses.  Exam reveals no gallop and no friction rub.   No murmur heard. Pulmonary/Chest: Effort normal. No respiratory distress. She has no wheezes. She has no rales. She exhibits no tenderness.  Abdominal: She exhibits no distension and no mass. There is no tenderness.  Musculoskeletal: Normal range of motion. She exhibits no edema or tenderness.  Generalized weakness without focality. History of gait instability. Has not walked in the last 2 months.  Lymphadenopathy:    She has no cervical adenopathy.  Neurological: She is alert and oriented to person, place, and time. No cranial nerve deficit. Coordination normal.  Episodes of confusion  Skin: No rash noted. She is not diaphoretic. No erythema. No pallor.  Psychiatric: She has a normal mood and affect. Her behavior is normal. Judgment and thought content normal.         Problem List as of 01/15/2013    ICD-9-CM   Heart murmur   Hypertension   Chronic kidney disease, stage III (moderate)   UTI (lower urinary tract infection)   Metabolic encephalopathy   Diabetes mellitus type 2, uncontrolled   Sepsis syndrome   Dehydration   Weakness      Plan: Patient is admitted to skilled nursing facility for a short-term rehabilitation stay. She remains quite weak from her problems recently. She has chronic renal disease and had a  renal transplant in 2008.  Metabolic encephalopathy related to her tract infection seems to be clearing.  Diabetes mellitus was not well controlled in the hospital and will need continued attention at the nursing home.  She will be enrolled in physical therapy and occupational therapy. Her goals are to increase strength, self-care skills, and safe mobility.
